# Patient Record
Sex: Female | Born: 1945 | Race: White | Hispanic: No | Marital: Married | Smoking: Never smoker
Health system: Southern US, Community
[De-identification: ages and names within clinical notes are randomized; demographics above are authoritative.]

## PROBLEM LIST (undated history)

## (undated) DIAGNOSIS — I10 Essential (primary) hypertension: Secondary | ICD-10-CM

## (undated) DIAGNOSIS — M549 Dorsalgia, unspecified: Secondary | ICD-10-CM

## (undated) DIAGNOSIS — G4730 Sleep apnea, unspecified: Secondary | ICD-10-CM

## (undated) DIAGNOSIS — R0602 Shortness of breath: Secondary | ICD-10-CM

## (undated) DIAGNOSIS — R002 Palpitations: Secondary | ICD-10-CM

## (undated) DIAGNOSIS — F419 Anxiety disorder, unspecified: Secondary | ICD-10-CM

## (undated) DIAGNOSIS — K5900 Constipation, unspecified: Secondary | ICD-10-CM

## (undated) DIAGNOSIS — M1990 Unspecified osteoarthritis, unspecified site: Secondary | ICD-10-CM

## (undated) DIAGNOSIS — K829 Disease of gallbladder, unspecified: Secondary | ICD-10-CM

## (undated) DIAGNOSIS — M7989 Other specified soft tissue disorders: Secondary | ICD-10-CM

## (undated) DIAGNOSIS — K219 Gastro-esophageal reflux disease without esophagitis: Secondary | ICD-10-CM

## (undated) DIAGNOSIS — M2550 Pain in unspecified joint: Secondary | ICD-10-CM

## (undated) DIAGNOSIS — F32A Depression, unspecified: Secondary | ICD-10-CM

## (undated) DIAGNOSIS — G709 Myoneural disorder, unspecified: Secondary | ICD-10-CM

## (undated) DIAGNOSIS — F329 Major depressive disorder, single episode, unspecified: Secondary | ICD-10-CM

## (undated) DIAGNOSIS — E669 Obesity, unspecified: Secondary | ICD-10-CM

## (undated) DIAGNOSIS — E119 Type 2 diabetes mellitus without complications: Secondary | ICD-10-CM

## (undated) DIAGNOSIS — J45909 Unspecified asthma, uncomplicated: Secondary | ICD-10-CM

## (undated) DIAGNOSIS — Z8639 Personal history of other endocrine, nutritional and metabolic disease: Secondary | ICD-10-CM

## (undated) DIAGNOSIS — R7303 Prediabetes: Secondary | ICD-10-CM

## (undated) HISTORY — DX: Disease of gallbladder, unspecified: K82.9

## (undated) HISTORY — DX: Personal history of other endocrine, nutritional and metabolic disease: Z86.39

## (undated) HISTORY — DX: Dorsalgia, unspecified: M54.9

## (undated) HISTORY — PX: ROTATOR CUFF REPAIR: SHX139

## (undated) HISTORY — DX: Constipation, unspecified: K59.00

## (undated) HISTORY — PX: CATARACT EXTRACTION W/ INTRAOCULAR LENS IMPLANT: SHX1309

## (undated) HISTORY — PX: APPENDECTOMY: SHX54

## (undated) HISTORY — DX: Unspecified osteoarthritis, unspecified site: M19.90

## (undated) HISTORY — DX: Pain in unspecified joint: M25.50

## (undated) HISTORY — PX: SHOULDER SURGERY: SHX246

## (undated) HISTORY — DX: Other specified soft tissue disorders: M79.89

## (undated) HISTORY — PX: KNEE ARTHROSCOPY: SUR90

## (undated) HISTORY — DX: Palpitations: R00.2

## (undated) HISTORY — DX: Shortness of breath: R06.02

## (undated) HISTORY — DX: Sleep apnea, unspecified: G47.30

## (undated) HISTORY — PX: COLONOSCOPY: SHX174

## (undated) HISTORY — PX: CATARACT EXTRACTION, BILATERAL: SHX1313

## (undated) HISTORY — DX: Gastro-esophageal reflux disease without esophagitis: K21.9

## (undated) HISTORY — PX: ABDOMINAL HYSTERECTOMY: SHX81

## (undated) HISTORY — PX: CHOLECYSTECTOMY: SHX55

## (undated) HISTORY — PX: KNEE SURGERY: SHX244

## (undated) HISTORY — DX: Obesity, unspecified: E66.9

---

## 2013-09-05 DIAGNOSIS — J309 Allergic rhinitis, unspecified: Secondary | ICD-10-CM | POA: Insufficient documentation

## 2015-07-12 ENCOUNTER — Encounter: Payer: Self-pay | Admitting: Pulmonary Disease

## 2015-07-12 ENCOUNTER — Ambulatory Visit (INDEPENDENT_AMBULATORY_CARE_PROVIDER_SITE_OTHER): Payer: Medicare Other | Admitting: Pulmonary Disease

## 2015-07-12 VITALS — BP 140/84 | HR 87 | Temp 98.3°F | Ht 64.0 in | Wt 242.0 lb

## 2015-07-12 DIAGNOSIS — G4733 Obstructive sleep apnea (adult) (pediatric): Secondary | ICD-10-CM | POA: Diagnosis not present

## 2015-07-12 DIAGNOSIS — R05 Cough: Secondary | ICD-10-CM

## 2015-07-12 DIAGNOSIS — R059 Cough, unspecified: Secondary | ICD-10-CM

## 2015-07-12 MED ORDER — CHLORPHENIRAMINE MALEATE 4 MG PO TABS: 4.0000 mg | ORAL_TABLET | Freq: Four times a day (QID) | ORAL | Status: DC

## 2015-07-12 MED ORDER — AZELASTINE-FLUTICASONE 137-50 MCG/ACT NA SUSP: 1.0000 | Freq: Two times a day (BID) | NASAL | Status: DC

## 2015-07-12 NOTE — Patient Instructions (Signed)
We will start you on chlorpheniramine 4 mg q6 hrs and dymista nasal spray You will be scheduled for PFTs and a sleep study.  Continue using the albuterol PRN and protonix as prescribed.  Return to clinic in 1 month.

## 2015-07-12 NOTE — Progress Notes (Addendum)
Subjective:    Patient ID: Crystal CrouchRebecca Jackson, female    DOB: [birth year], 70 y.o.   MRN: [id number]  HPI Consult for evaluation of chronic cough, bronchitis.  [name] is a 70 year old with past medical history of high blood pressure. She had been diagnosed with bronchitis as a child. She reports increasing frequency of attacks of the past few years. Her latest episode was in June 2017. She was treated at that time with an albuterol inhaler and a Z-Pak with no improvement in symptoms. She was subsequently given Symbicort and Cipro with improvement. A chest x-ray done at that time is reported as bronchitis with no infiltrate.  Her symptoms start with sinusitis postnasal drip which then proceeds to cough with yellow mucus, wheezing, chest congestion. She does not report any fevers or chills. She has seasonal allergies, rhinitis, postnasal drip. She has significant issues with acid reflux and heartburn and is on Protonix 40 mg twice a day. She was assessed by GI Dr. Mayford KnifeWilliams a few years ago and was told she has a small hiatal hernia. Her current symptoms are chronic daily cough, non-productive sputum. She has symptoms of snoring at night, daytime sleepiness and witnessed apneas as per her husband.  Social history: She is a never smoker, no alcohol, drug use. 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 She has no recent travel. She has no significant exposures at work or at home.  Family History: Heart disease-mother, father Prostate cancer-father  PMH: Hypertension   Current outpatient prescriptions:  .  albuterol (PROAIR HFA) 108 (90 Base) MCG/ACT inhaler, Inhale into the lungs., Disp: , Rfl:  .  Calcium Carbonate-Vitamin D (CALCIUM-VITAMIN D) 500-200 MG-UNIT tablet, Take by mouth., Disp: , Rfl:  .  dicyclomine (BENTYL) 20 MG tablet, Take by mouth., Disp: , Rfl:  .  estradiol (ESTRACE) 1 MG tablet, Take 1 mg by mouth., Disp: , Rfl:  .  hydrochlorothiazide (HYDRODIURIL) 25 MG tablet, , Disp:  , Rfl:  .  irbesartan (AVAPRO) 300 MG tablet, Take 300 mg by mouth., Disp: , Rfl:  .  metoprolol tartrate (LOPRESSOR) 25 MG tablet, , Disp: , Rfl:  .  Multiple Vitamin (MULTIVITAMIN) tablet, Take by mouth., Disp: , Rfl:  .  pantoprazole (PROTONIX) 40 MG tablet, Take 40 mg by mouth., Disp: , Rfl:  .  potassium chloride (K-DUR,KLOR-CON) 10 MEQ tablet, , Disp: , Rfl:  .  venlafaxine XR (EFFEXOR-XR) 75 MG 24 hr capsule, Take 225 mg by mouth., Disp: , Rfl:  .  Azelastine-Fluticasone 137-50 MCG/ACT SUSP, Place 1 spray into the nose 2 (two) times daily., Disp: 1 Bottle, Rfl: 3 .  chlorpheniramine (CHLOR-TRIMETON) 4 MG tablet, Take 1 tablet (4 mg total) by mouth every 6 (six) hours., Disp: 120 tablet, Rfl: 0  Review of Systems Dyspnea on exertion and at rest, cough no sputum production. Nasal congestion, rhinitis, postnasal drip. Positive for heartburn, indigestion. No nausea, vomiting, diarrhea, constipation. No chest pain, palpitation. No fevers, chills, malaise, loss of weight, loss of appetite. All other review of systems are negative.    Objective:   Physical Exam Blood pressure 140/84, pulse 87, temperature 98.3 F (36.8 C), temperature source Oral, height 5\' 4"  (1.626 m), weight 242 lb (109.77 kg), SpO2 95 %. Gen: No apparent distress Neuro: No gross focal deficits. HEENT: No JVD, lymphadenopathy, thyromegaly. Posterior pharyngeal erythema RS: Clear, No wheeze or crackles CVS: S1-S2 heard, no murmurs rubs gallops. Abdomen: Soft, positive bowel sounds. Musculoskeletal: No edema.    Assessment &  Plan:  Eval for Chronic cough, bronchitis  The presentation is likely upper airway cough syndrome with asthmatic bronchitis, exacerbated by postnasal drip and acid reflux. She may have concomitant untreated sleep apnea that is contributing to cough. She is on avapro for HTN but this is not commonly associated with cough   I will treat her in a stepwise manner. She will be started on  antihistamine with chlorpheniramine and dymista nasal spray. Schedule for pulmonary function tests. I'll hold off on using any long term controller inhalers for lung for now but will continue the albuterol PRN. She is already being maximally treated with PPI for acid reflux. If her symptoms persist then we may need to consider a GI referral and re evaluation of hiatal hernia. She will be scheduled for sleep study to evaluate for presence of sleep apnea.   I educated her on behavioral changes to deal with cough including conscious suppression of the urge to cough, use of throat lozenges.  Plan: - Start antihistamine with chlorpheniramine. Pt was warned about the side effect of increased sleepiness - Start dymista nasal spray - Continue protonix bid - PFTs and sleep study.  Chilton Greathouse MD [facility] Pager [phone] If no answer or after 3pm call: [phone] 07/12/2015, 5:13 PM

## 2015-07-20 DIAGNOSIS — G4733 Obstructive sleep apnea (adult) (pediatric): Secondary | ICD-10-CM | POA: Diagnosis not present

## 2015-07-22 ENCOUNTER — Other Ambulatory Visit: Payer: Self-pay | Admitting: *Deleted

## 2015-07-22 DIAGNOSIS — G4733 Obstructive sleep apnea (adult) (pediatric): Secondary | ICD-10-CM

## 2015-07-22 DIAGNOSIS — R05 Cough: Secondary | ICD-10-CM

## 2015-07-22 DIAGNOSIS — R059 Cough, unspecified: Secondary | ICD-10-CM

## 2015-07-25 ENCOUNTER — Encounter (INDEPENDENT_AMBULATORY_CARE_PROVIDER_SITE_OTHER): Payer: Medicare Other | Admitting: Pulmonary Disease

## 2015-07-25 DIAGNOSIS — R05 Cough: Secondary | ICD-10-CM

## 2015-07-25 DIAGNOSIS — G4733 Obstructive sleep apnea (adult) (pediatric): Secondary | ICD-10-CM

## 2015-07-25 DIAGNOSIS — R059 Cough, unspecified: Secondary | ICD-10-CM

## 2015-07-25 LAB — PULMONARY FUNCTION TEST
DL/VA % PRED: 90 %
DL/VA: 4.34 ml/min/mmHg/L
DLCO COR: 18.19 ml/min/mmHg
DLCO cor % pred: 74 %
DLCO unc % pred: 72 %
DLCO unc: 17.67 ml/min/mmHg
FEF 25-75 PRE: 1.2 L/s
FEF 25-75 Post: 1.61 L/sec
FEF2575-%CHANGE-POST: 33 %
FEF2575-%PRED-PRE: 61 %
FEF2575-%Pred-Post: 82 %
FEV1-%Change-Post: 7 %
FEV1-%PRED-PRE: 77 %
FEV1-%Pred-Post: 83 %
FEV1-POST: 1.92 L
FEV1-PRE: 1.8 L
FEV1FVC-%CHANGE-POST: 4 %
FEV1FVC-%Pred-Pre: 95 %
FEV6-%CHANGE-POST: 1 %
FEV6-%PRED-PRE: 85 %
FEV6-%Pred-Post: 86 %
FEV6-POST: 2.52 L
FEV6-PRE: 2.49 L
FEV6FVC-%Change-Post: 0 %
FEV6FVC-%PRED-POST: 103 %
FEV6FVC-%PRED-PRE: 104 %
FVC-%Change-Post: 2 %
FVC-%PRED-POST: 83 %
FVC-%Pred-Pre: 81 %
FVC-POST: 2.54 L
FVC-Pre: 2.49 L
POST FEV6/FVC RATIO: 99 %
PRE FEV1/FVC RATIO: 72 %
Post FEV1/FVC ratio: 76 %
Pre FEV6/FVC Ratio: 100 %
RV % PRED: 83 %
RV: 1.8 L
TLC % PRED: 89 %
TLC: 4.5 L

## 2015-08-29 ENCOUNTER — Ambulatory Visit: Payer: Medicare Other | Admitting: Pulmonary Disease

## 2015-09-09 ENCOUNTER — Other Ambulatory Visit: Payer: Self-pay | Admitting: Pulmonary Disease

## 2015-09-09 DIAGNOSIS — R05 Cough: Secondary | ICD-10-CM

## 2015-09-09 DIAGNOSIS — R059 Cough, unspecified: Secondary | ICD-10-CM

## 2015-09-09 DIAGNOSIS — G4733 Obstructive sleep apnea (adult) (pediatric): Secondary | ICD-10-CM

## 2015-09-09 MED ORDER — CHLORPHENIRAMINE MALEATE 4 MG PO TABS: 4.0000 mg | ORAL_TABLET | Freq: Four times a day (QID) | ORAL | 0 refills | Status: DC

## 2015-09-09 NOTE — Telephone Encounter (Signed)
Received faxed refill request for pt's Chlor-tabs Rx sent Will sign off

## 2015-09-12 ENCOUNTER — Ambulatory Visit (INDEPENDENT_AMBULATORY_CARE_PROVIDER_SITE_OTHER): Payer: Medicare Other | Admitting: Pulmonary Disease

## 2015-09-12 ENCOUNTER — Encounter (INDEPENDENT_AMBULATORY_CARE_PROVIDER_SITE_OTHER): Payer: Self-pay

## 2015-09-12 ENCOUNTER — Encounter: Payer: Self-pay | Admitting: Pulmonary Disease

## 2015-09-12 VITALS — BP 128/76 | HR 85 | Ht 64.5 in | Wt 243.8 lb

## 2015-09-12 DIAGNOSIS — R05 Cough: Secondary | ICD-10-CM

## 2015-09-12 DIAGNOSIS — R059 Cough, unspecified: Secondary | ICD-10-CM

## 2015-09-12 DIAGNOSIS — Z23 Encounter for immunization: Secondary | ICD-10-CM | POA: Diagnosis not present

## 2015-09-12 DIAGNOSIS — G4733 Obstructive sleep apnea (adult) (pediatric): Secondary | ICD-10-CM

## 2015-09-12 MED ORDER — BECLOMETHASONE DIPROPIONATE 40 MCG/ACT IN AERS: 2.0000 | INHALATION_SPRAY | Freq: Two times a day (BID) | RESPIRATORY_TRACT | 5 refills | Status: DC

## 2015-09-12 MED ORDER — BECLOMETHASONE DIPROPIONATE 40 MCG/ACT IN AERS: 2.0000 | INHALATION_SPRAY | Freq: Two times a day (BID) | RESPIRATORY_TRACT | 0 refills | Status: DC

## 2015-09-12 MED ORDER — FLUTICASONE PROPIONATE 50 MCG/ACT NA SUSP: 2.0000 | Freq: Every day | NASAL | 5 refills | Status: DC

## 2015-09-12 NOTE — Patient Instructions (Signed)
We will start you on Qvar inhaler. Continue using albuterol as needed. We'll start you on a CPAP for mild sleep apnea. Continue using the chlorpheniramine. It's okay to switch dymista to Flonase nasal spray. You'll get the flu vaccine today.  Return to clinic in 6 months.

## 2015-09-12 NOTE — Progress Notes (Signed)
Crystal Jackson    [id number]    1945/04/19  Primary Care Physician:POLLOCK, Delton See, MD  Referring Physician: Albertina Senegal, MD [address] [place], [place] [address]  Chief complaint:   Follow up for Upper airway chronic cough,  Bronchitis.  HPI: Crystal Jackson is a 70 year old with past medical history of high blood pressure. She had been diagnosed with bronchitis as a child. She reports increasing frequency of attacks of the past few years. Her latest episode was in June 2017. She was treated at that time with an albuterol inhaler and a Z-Pak with no improvement in symptoms. She was subsequently given Symbicort and Cipro with improvement. A chest x-ray done at that time is reported as bronchitis with no infiltrate.  Her symptoms start with sinusitis postnasal drip which then proceeds to cough with yellow mucus, wheezing, chest congestion. She does not report any fevers or chills. She has seasonal allergies, rhinitis, postnasal drip. She has significant issues with acid reflux and heartburn and is on Protonix 40 mg twice a day. She was assessed by GI Dr. Mayford Jackson a few years ago and was told she has a small hiatal hernia. Her current symptoms are chronic daily cough, non-productive sputum. She has symptoms of snoring at night, daytime sleepiness and witnessed apneas as per her husband.  Outpatient Encounter Prescriptions as of 09/12/2015  Medication Sig  . albuterol (PROAIR HFA) 108 (90 Base) MCG/ACT inhaler Inhale into the lungs.  Marland Kitchen amoxicillin-clavulanate (AUGMENTIN) 875-125 MG tablet Take 1 tablet by mouth 2 (two) times daily. Beginning 9/10 for cat bite  . Azelastine-Fluticasone 137-50 MCG/ACT SUSP Place 1 spray into the nose 2 (two) times daily.  . Calcium Carbonate-Vitamin D (CALCIUM-VITAMIN D) 500-200 MG-UNIT tablet Take by mouth.  . chlorpheniramine (CHLOR-TRIMETON) 4 MG tablet Take 1 tablet (4 mg total) by mouth every 6 (six) hours.  Marland Kitchen dicyclomine (BENTYL) 20 MG  tablet Take by mouth.  . DULoxetine (CYMBALTA) 60 MG capsule Take 60 mg by mouth daily.  Marland Kitchen estradiol (ESTRACE) 1 MG tablet Take 1 mg by mouth.  . hydrochlorothiazide (HYDRODIURIL) 25 MG tablet   . irbesartan (AVAPRO) 300 MG tablet Take 300 mg by mouth.  . metoprolol tartrate (LOPRESSOR) 25 MG tablet   . Multiple Vitamin (MULTIVITAMIN) tablet Take by mouth.  . pantoprazole (PROTONIX) 40 MG tablet Take 40 mg by mouth.  . potassium chloride (K-DUR,KLOR-CON) 10 MEQ tablet   . [DISCONTINUED] venlafaxine XR (EFFEXOR-XR) 75 MG 24 hr capsule Take 225 mg by mouth.   No facility-administered encounter medications on file as of 09/12/2015.     Allergies as of 09/12/2015 - Review Complete 09/12/2015  Allergen Reaction Noted  . Codeine Nausea And Vomiting 07/12/2015  . Penicillin g Rash 07/12/2015    History reviewed. No pertinent past medical history.  History reviewed. No pertinent surgical history.  History reviewed. No pertinent family history.  Social History   Social History  . Marital status: Married    Spouse name: N/A  . Number of children: N/A  . Years of education: N/A   Occupational History  . Not on file.   Social History Main Topics  . Smoking status: Never Smoker  . Smokeless tobacco: Never Used  . Alcohol use No  . Drug use: No  . Sexual activity: Not on file   Other Topics Concern  . Not on file   Social History Narrative  . No narrative on file     Review of systems:  Review of Systems  Constitutional: Negative for fever and chills.  HENT: Negative.   Eyes: Negative for blurred vision.  Respiratory: as per HPI  Cardiovascular: Negative for chest pain and palpitations.  Gastrointestinal: Negative for vomiting, diarrhea, blood per rectum. Genitourinary: Negative for dysuria, urgency, frequency and hematuria.  Musculoskeletal: Negative for myalgias, back pain and joint pain.  Skin: Negative for itching and rash.  Neurological: Negative for dizziness,  tremors, focal weakness, seizures and loss of consciousness.  Endo/Heme/Allergies: Negative for environmental allergies.  Psychiatric/Behavioral: Negative for depression, suicidal ideas and hallucinations.  All other systems reviewed and are negative.   Physical Exam: There were no vitals taken for this visit. Gen:      No acute distress HEENT:  EOMI, sclera anicteric Neck:     No masses; no thyromegaly Lungs:    Clear to auscultation bilaterally; normal respiratory effort CV:         Regular rate and rhythm; no murmurs Abd:      + bowel sounds; soft, non-tender; no palpable masses, no distension Ext:    No edema; adequate peripheral perfusion Skin:      Warm and dry; no rash Neuro: alert and oriented x 3 Psych: normal mood and affect  Data Reviewed: PFTs 7/44/17 FVC 2.54 [83%] FEV1 1.9 to [83%) F/F 76 TLC 89% DLCO 72%. Minimal obstructive lung disease with mild reduction in diffusion capacity that corrects for alveolar volume.  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 Low O2 sats of 71%.  Assessment/Plan:  #1 Upper airway cough syndrome, Asthmatic bronchitis. Symptoms are much improved on chlorpheniramine and dymista nasal spray. She has switched the dymista to flonase due to the cost of meds. She notices a worsening of symptoms when she stops these meds and will continue the same. Her PFTs were reviewed today. There is minimal obstruction. However there is marked reduction in mid flow rates indicating small airway disease with significant improvement post albuterol inhaler. I believe she'll benefit from long-term controller medication and will start her on a Qvar inhaler. She'll continue the albuterol as needed.  #2 OSA Mild OSA on recent home sleep study. I'll start her on an AutoSet CPAP 5- 15. Check download at next visit.  Plan: - Continue chlorpheniramine - OK to change dymista to flonase - Start qvar inhaler. Continue albuterol PRN - Start Autoset CPAP.  Crystal GreathousePraveen  Crystal Fetsch MD [facility] Pager [phone] If no answer or after 3pm call: [phone] 09/12/2015, 2:17 PM  CC: Crystal SenegalPollock, Nelson, MD

## 2015-09-19 ENCOUNTER — Telehealth: Payer: Self-pay | Admitting: Pulmonary Disease

## 2015-09-19 NOTE — Telephone Encounter (Signed)
Called [facility] [phone]. Pt's ID: [id number]. Spoke with Lanora ManisElizabeth. PA has been started and sent to clinical review. PA #: [id number]. Pt's last OV note has been sent to Dr. Julius BowelsPollock.  lmtcb x1 for pt.

## 2015-09-20 NOTE — Telephone Encounter (Signed)
Received fax from [facility] regarding PA for pt's QVAR: coverage has been denied  "We denied this request under Medicare Paret D because..the use is not supported by the FDA or by one of the Medicare approved references for treating.Marland Kitchen.Marland Kitchen.Upper airway cough syndrome."  We have tried to reach patient x2 to inform her that the PA was initiated (last attempt was today 9/19).  Will await her return call to inform her the PA was denied   Called [facility] @ [phone] and spoke with Jon GillsAlexis to check for covered alternatives: Flovent, Arnuity.  PM please advise if you would like to do an appeal or try a different medication.  Thanks!

## 2015-09-20 NOTE — Telephone Encounter (Signed)
Spoke with pt. She is aware of all of this information.  PM - please advise. Thanks.

## 2015-09-20 NOTE — Telephone Encounter (Signed)
lmtcb x2 for pt. 

## 2015-09-20 NOTE — Telephone Encounter (Signed)
Patient returning our call - She can be reached at [phone]-pr

## 2015-09-21 NOTE — Telephone Encounter (Signed)
PM please advise 

## 2015-09-21 NOTE — Telephone Encounter (Signed)
She has a diagnosis of asthmatic bronchitis as well. That should be covered by Qvar. Thanks

## 2015-09-22 NOTE — Telephone Encounter (Signed)
Spoke with Tammy at [facility] to see if a new PA can be initiated, or if this must be appealed through [employer].  Per Tammy we must proceed with an appeal through Medicare, as Medicare will only accept one PA per medication every 60 days.  Tammy is refaxing denial letter with documentation for appealing decision.   Will await fax.

## 2015-09-23 NOTE — Telephone Encounter (Signed)
Routing to [place] for follow up on appeal.

## 2015-09-27 NOTE — Telephone Encounter (Signed)
Spoke with Crystal Jackson and she does have the appeal letter.

## 2015-09-28 NOTE — Telephone Encounter (Signed)
Appeal information received Form filled out and placed in PM's folder for signature

## 2015-09-29 NOTE — Telephone Encounter (Signed)
Awaiting PM's return to office on 10/03/15

## 2015-10-01 ENCOUNTER — Emergency Department (HOSPITAL_BASED_OUTPATIENT_CLINIC_OR_DEPARTMENT_OTHER)
Admission: EM | Admit: 2015-10-01 | Discharge: 2015-10-01 | Disposition: A | Discharge status: Home / Self Care | Payer: Medicare Other | Attending: Emergency Medicine | Admitting: Emergency Medicine

## 2015-10-01 ENCOUNTER — Encounter (HOSPITAL_BASED_OUTPATIENT_CLINIC_OR_DEPARTMENT_OTHER): Payer: Self-pay | Admitting: Emergency Medicine

## 2015-10-01 DIAGNOSIS — S81852A Open bite, left lower leg, initial encounter: Secondary | ICD-10-CM | POA: Diagnosis not present

## 2015-10-01 DIAGNOSIS — J45909 Unspecified asthma, uncomplicated: Secondary | ICD-10-CM | POA: Diagnosis not present

## 2015-10-01 DIAGNOSIS — Y999 Unspecified external cause status: Secondary | ICD-10-CM | POA: Insufficient documentation

## 2015-10-01 DIAGNOSIS — S8992XA Unspecified injury of left lower leg, initial encounter: Secondary | ICD-10-CM | POA: Diagnosis present

## 2015-10-01 DIAGNOSIS — Y929 Unspecified place or not applicable: Secondary | ICD-10-CM | POA: Insufficient documentation

## 2015-10-01 DIAGNOSIS — I10 Essential (primary) hypertension: Secondary | ICD-10-CM | POA: Insufficient documentation

## 2015-10-01 DIAGNOSIS — W5501XA Bitten by cat, initial encounter: Secondary | ICD-10-CM | POA: Diagnosis not present

## 2015-10-01 DIAGNOSIS — Y9389 Activity, other specified: Secondary | ICD-10-CM | POA: Diagnosis not present

## 2015-10-01 DIAGNOSIS — L089 Local infection of the skin and subcutaneous tissue, unspecified: Secondary | ICD-10-CM

## 2015-10-01 HISTORY — DX: Unspecified asthma, uncomplicated: J45.909

## 2015-10-01 HISTORY — DX: Essential (primary) hypertension: I10

## 2015-10-01 HISTORY — DX: Major depressive disorder, single episode, unspecified: F32.9

## 2015-10-01 HISTORY — DX: Depression, unspecified: F32.A

## 2015-10-01 HISTORY — DX: Anxiety disorder, unspecified: F41.9

## 2015-10-01 MED ORDER — AMOXICILLIN-POT CLAVULANATE 500-125 MG PO TABS: 1.0000 | ORAL_TABLET | Freq: Three times a day (TID) | ORAL | 0 refills | Status: DC

## 2015-10-01 MED ORDER — SODIUM CHLORIDE 0.9 % IV SOLN
3.0000 g | Freq: Once | INTRAVENOUS | Status: AC
  Administered 2015-10-01: 3 g via INTRAVENOUS
  Filled 2015-10-01: qty 3

## 2015-10-01 NOTE — ED Triage Notes (Signed)
Pt reports persistent pain after completing course of antibiotics for tx of animal bite to lower left leg.

## 2015-10-01 NOTE — Discharge Instructions (Signed)
Local wound care with bacitracin and dressing changes twice daily.  Augmentin as prescribed.  Follow-up with Dr. Magnus IvanBlackman on Monday for a wound check. His contact information has been provided in this discharge summary for you to call and make these arrangements.

## 2015-10-01 NOTE — ED Provider Notes (Signed)
[facility]   CSN: [id number] Arrival date & time: 10/01/15  1406     History   Chief Complaint Chief Complaint  Patient presents with  . Wound Check    HPI Neil CrouchRebecca Virtue is a 70 y.o. female.  Patient is a 70 year old female with history of hypertension and asthma. She presents for evaluation of left leg pain and swelling. She was bitten by her own cat approximately 3 weeks ago while breaking up a fight between the cat and another animal. Since that time she has been treated with Augmentin, then doxycycline, however she continues to have drainage, redness, and pain. She denies any fevers or chills.      Past Medical History:  Diagnosis Date  . Anxiety   . Asthma   . Depression   . Hypertension     There are no active problems to display for this patient.   Past Surgical History:  Procedure Laterality Date  . ABDOMINAL HYSTERECTOMY    . APPENDECTOMY    . CHOLECYSTECTOMY    . KNEE SURGERY Left   . SHOULDER SURGERY Right     OB History    No data available       Home Medications    Prior to Admission medications   Medication Sig Start Date End Date Taking? Authorizing Provider  albuterol (PROAIR HFA) 108 (90 Base) MCG/ACT inhaler Inhale into the lungs. 06/10/15   Historical Provider, MD  amoxicillin-clavulanate (AUGMENTIN) 875-125 MG tablet Take 1 tablet by mouth 2 (two) times daily. Beginning 9/10 for cat bite 09/10/15   Historical Provider, MD  beclomethasone (QVAR) 40 MCG/ACT inhaler Inhale 2 puffs into the lungs 2 (two) times daily. 09/12/15   Praveen Mannam, MD  beclomethasone (QVAR) 40 MCG/ACT inhaler Inhale 2 puffs into the lungs 2 (two) times daily. 09/12/15 09/13/15  Praveen Mannam, MD  Calcium Carbonate-Vitamin D (CALCIUM-VITAMIN D) 500-200 MG-UNIT tablet Take by mouth.    Historical Provider, MD  chlorpheniramine (CHLOR-TRIMETON) 4 MG tablet Take 1 tablet (4 mg total) by mouth every 6 (six) hours. 09/09/15   Praveen Mannam, MD    dicyclomine (BENTYL) 20 MG tablet Take by mouth.    Historical Provider, MD  DULoxetine (CYMBALTA) 60 MG capsule Take 90 mg by mouth daily.  08/25/15   Historical Provider, MD  estradiol (ESTRACE) 1 MG tablet Take 1 mg by mouth.    Historical Provider, MD  fluticasone (FLONASE) 50 MCG/ACT nasal spray Place 2 sprays into both nostrils daily. 09/12/15   Praveen Mannam, MD  hydrochlorothiazide (HYDRODIURIL) 25 MG tablet  02/24/15   Historical Provider, MD  irbesartan (AVAPRO) 300 MG tablet Take 300 mg by mouth. 01/17/15   Historical Provider, MD  metoprolol tartrate (LOPRESSOR) 25 MG tablet  01/27/15   Historical Provider, MD  Multiple Vitamin (MULTIVITAMIN) tablet Take by mouth.    Historical Provider, MD  pantoprazole (PROTONIX) 40 MG tablet Take 40 mg by mouth. 01/31/15   Historical Provider, MD  potassium chloride (K-DUR,KLOR-CON) 10 MEQ tablet  01/31/15   Historical Provider, MD    Family History No family history on file.  Social History Social History  Substance Use Topics  . Smoking status: Never Smoker  . Smokeless tobacco: Never Used  . Alcohol use No     Allergies   Codeine and Penicillin g   Review of Systems Review of Systems  All other systems reviewed and are negative.    Physical Exam Updated Vital Signs BP 132/71 (BP Location: Right Arm)  Pulse 86   Temp 97.4 F (36.3 C) (Oral)   Resp 18   Ht 5\' 4"  (1.626 m)   Wt 235 lb (106.6 kg)   SpO2 98%   BMI 40.34 kg/m   Physical Exam  Constitutional: She is oriented to person, place, and time. She appears well-developed and well-nourished. No distress.  HENT:  Head: Normocephalic and atraumatic.  Neck: Normal range of motion. Neck supple.  Musculoskeletal:  The left lower extremity is noted to have 4 puncture wounds noted to the anterior aspect of the distal third of the left shin. There is surrounding erythema and serous drainage, however no purulent drainage is noted. Distal pulses are easily palpable.   Neurological: She is alert and oriented to person, place, and time.  Skin: Skin is warm and dry. She is not diaphoretic.  Nursing note and vitals reviewed.    ED Treatments / Results  Labs (all labs ordered are listed, but only abnormal results are displayed) Labs Reviewed - No data to display  EKG  EKG Interpretation None       Radiology No results found.  Procedures Procedures (including critical care time)  Medications Ordered in ED Medications  Ampicillin-Sulbactam (UNASYN) 3 g in sodium chloride 0.9 % 100 mL IVPB (not administered)     Initial Impression / Assessment and Plan / ED Course  I have reviewed the triage vital signs and the nursing notes.  Pertinent labs & imaging results that were available during my care of the patient were reviewed by me and considered in my medical decision making (see chart for details).  Clinical Course    Patient will be given IV Unasyn in the ER and discharged on augmentin. I've spoken with Dr. Magnus Ivan from orthopedic surgery who will follow patient up in the office on Monday. She is to call to make these arrangements.  Final Clinical Impressions(s) / ED Diagnoses   Final diagnoses:  None    New Prescriptions New Prescriptions   No medications on file     Geoffery Lyons, MD 10/01/15 1514

## 2015-10-03 ENCOUNTER — Inpatient Hospital Stay (INDEPENDENT_AMBULATORY_CARE_PROVIDER_SITE_OTHER): Payer: Medicare Other | Admitting: Orthopaedic Surgery

## 2015-10-03 DIAGNOSIS — S91052A Open bite, left ankle, initial encounter: Secondary | ICD-10-CM

## 2015-10-03 NOTE — Telephone Encounter (Signed)
Appeal signed by PM and faxed back to [facility] @ [phone] Will await decision

## 2015-10-05 NOTE — Telephone Encounter (Signed)
[name] Her clinicial questions were answered. Will await appeal decision.

## 2015-10-05 NOTE — Telephone Encounter (Signed)
[name] and would like to ask clinical questions. She can be reached at [phone]. -pr

## 2015-10-10 ENCOUNTER — Ambulatory Visit (INDEPENDENT_AMBULATORY_CARE_PROVIDER_SITE_OTHER): Payer: Medicare Other | Admitting: Orthopaedic Surgery

## 2015-10-10 DIAGNOSIS — S91052D Open bite, left ankle, subsequent encounter: Secondary | ICD-10-CM | POA: Diagnosis not present

## 2015-10-10 NOTE — Telephone Encounter (Signed)
Patient calling regarding denial of Qvar - she can be reached at [phone]

## 2015-10-10 NOTE — Telephone Encounter (Signed)
Spoke with pt and she has received DENIAL letter from the appeal for Qvar. She would like to know what she needs to do now. What other inhaler do you recommend?  PM - Please advise. Thanks!

## 2015-10-12 NOTE — Telephone Encounter (Signed)
Spoke with pt. She is going to contact her insurance for prescription formulary and give us a call back. Will await her call back.

## 2015-10-12 NOTE — Telephone Encounter (Signed)
Can we check with insurance which steroids inhaler is covered? Thanks

## 2015-10-17 ENCOUNTER — Ambulatory Visit (INDEPENDENT_AMBULATORY_CARE_PROVIDER_SITE_OTHER): Payer: Medicare Other | Admitting: Orthopaedic Surgery

## 2015-10-17 DIAGNOSIS — S81852D Open bite, left lower leg, subsequent encounter: Secondary | ICD-10-CM

## 2015-10-24 ENCOUNTER — Emergency Department (HOSPITAL_COMMUNITY): Payer: Medicare Other

## 2015-10-24 ENCOUNTER — Emergency Department (HOSPITAL_COMMUNITY)
Admission: EM | Admit: 2015-10-24 | Discharge: 2015-10-24 | Disposition: A | Discharge status: Home / Self Care | Payer: Medicare Other | Attending: Emergency Medicine | Admitting: Emergency Medicine

## 2015-10-24 ENCOUNTER — Encounter (HOSPITAL_COMMUNITY): Payer: Self-pay | Admitting: Emergency Medicine

## 2015-10-24 DIAGNOSIS — J45909 Unspecified asthma, uncomplicated: Secondary | ICD-10-CM | POA: Insufficient documentation

## 2015-10-24 DIAGNOSIS — Z79899 Other long term (current) drug therapy: Secondary | ICD-10-CM | POA: Diagnosis not present

## 2015-10-24 DIAGNOSIS — S81852D Open bite, left lower leg, subsequent encounter: Secondary | ICD-10-CM | POA: Diagnosis not present

## 2015-10-24 DIAGNOSIS — I10 Essential (primary) hypertension: Secondary | ICD-10-CM | POA: Insufficient documentation

## 2015-10-24 DIAGNOSIS — W5501XD Bitten by cat, subsequent encounter: Secondary | ICD-10-CM | POA: Insufficient documentation

## 2015-10-24 DIAGNOSIS — S91052D Open bite, left ankle, subsequent encounter: Secondary | ICD-10-CM

## 2015-10-24 DIAGNOSIS — T1490XD Injury, unspecified, subsequent encounter: Secondary | ICD-10-CM

## 2015-10-24 LAB — CBC WITH DIFFERENTIAL/PLATELET
BASOS ABS: 0 10*3/uL (ref 0.0–0.1)
BASOS PCT: 1 %
EOS ABS: 0.2 10*3/uL (ref 0.0–0.7)
EOS PCT: 3 %
HCT: 36.1 % (ref 36.0–46.0)
Hemoglobin: 12.1 g/dL (ref 12.0–15.0)
Lymphocytes Relative: 38 %
Lymphs Abs: 2.1 10*3/uL (ref 0.7–4.0)
MCH: 29 pg (ref 26.0–34.0)
MCHC: 33.5 g/dL (ref 30.0–36.0)
MCV: 86.6 fL (ref 78.0–100.0)
MONO ABS: 0.4 10*3/uL (ref 0.1–1.0)
MONOS PCT: 7 %
Neutro Abs: 3 10*3/uL (ref 1.7–7.7)
Neutrophils Relative %: 53 %
PLATELETS: 234 10*3/uL (ref 150–400)
RBC: 4.17 MIL/uL (ref 3.87–5.11)
RDW: 13.3 % (ref 11.5–15.5)
WBC: 5.6 10*3/uL (ref 4.0–10.5)

## 2015-10-24 LAB — COMPREHENSIVE METABOLIC PANEL
ALK PHOS: 35 U/L — AB (ref 38–126)
ALT: 27 U/L (ref 14–54)
ANION GAP: 11 (ref 5–15)
AST: 32 U/L (ref 15–41)
Albumin: 3.6 g/dL (ref 3.5–5.0)
BILIRUBIN TOTAL: 0.5 mg/dL (ref 0.3–1.2)
BUN: 13 mg/dL (ref 6–20)
CALCIUM: 9.2 mg/dL (ref 8.9–10.3)
CO2: 27 mmol/L (ref 22–32)
CREATININE: 1.02 mg/dL — AB (ref 0.44–1.00)
Chloride: 97 mmol/L — ABNORMAL LOW (ref 101–111)
GFR, EST NON AFRICAN AMERICAN: 55 mL/min — AB (ref >60)
Glucose, Bld: 100 mg/dL — ABNORMAL HIGH (ref 65–99)
Potassium: 3.5 mmol/L (ref 3.5–5.1)
SODIUM: 135 mmol/L (ref 135–145)
TOTAL PROTEIN: 6.7 g/dL (ref 6.5–8.1)

## 2015-10-24 LAB — URINALYSIS, ROUTINE W REFLEX MICROSCOPIC
BILIRUBIN URINE: NEGATIVE
Glucose, UA: NEGATIVE mg/dL
Hgb urine dipstick: NEGATIVE
KETONES UR: NEGATIVE mg/dL
LEUKOCYTES UA: NEGATIVE
NITRITE: NEGATIVE
PH: 6 (ref 5.0–8.0)
PROTEIN: NEGATIVE mg/dL
Specific Gravity, Urine: 1.006 (ref 1.005–1.030)

## 2015-10-24 LAB — I-STAT BETA HCG BLOOD, ED (MC, WL, AP ONLY)

## 2015-10-24 LAB — I-STAT CG4 LACTIC ACID, ED: LACTIC ACID, VENOUS: 1.69 mmol/L (ref 0.5–1.9)

## 2015-10-24 NOTE — ED Notes (Signed)
Pt transported to xray 

## 2015-10-24 NOTE — ED Triage Notes (Signed)
Has been seen several times for cat bite that happened 6 weks ago On left lower leg, has had several  Rounds of antiobiotics and has seen ortho but it is till red and hurting

## 2015-10-24 NOTE — ED Provider Notes (Signed)
[facility]-EM[facility]ENCY DEPT Provider Note   CSN: [id number] Arrival date & time: 10/24/15  1111     History   Chief Complaint Chief Complaint  Patient presents with  . Wound Infection  . Fever    HPI Crystal Jackson is a 70 y.o. female who presents for a wound check. PMH significant for HTN and asthma. She was bitten by her cat approximately 6 weeks ago on the left lower leg. Initially she went to UC who put her on Augmentin. She completed the course and returned to UC after she felt like it wasn't getting better. She was then put on a course of Doxy. She represented to [facility] on 9/30 and was given IV Unasyn and then another course of Augmentin. She was given a follow up with Dr. Magnus Ivan who she saw the next day. He performed a washout. She saw him again 1 week ago and he evaluated it at the time and per patient stated it looked better. Today she took her cat to the vet for a check up and showed them the wound. They told her she should get it checked again. She continues to have redness, drainage, pain although she states it does look better. She denies fever, chills, inability to ambulate. 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 No hx of DM or PVD.  HPI  Past Medical History:  Diagnosis Date  . Anxiety   . Asthma   . Depression   . Hypertension     There are no active problems to display for this patient.   Past Surgical History:  Procedure Laterality Date  . ABDOMINAL HYSTERECTOMY    . APPENDECTOMY    . CHOLECYSTECTOMY    . KNEE SURGERY Left   . SHOULDER SURGERY Right     OB History    No data available       Home Medications    Prior to Admission medications   Medication Sig Start Date End Date Taking? Authorizing Provider  albuterol (PROAIR HFA) 108 (90 Base) MCG/ACT inhaler Inhale into the lungs. 06/10/15   Historical Provider, MD  amoxicillin-clavulanate (AUGMENTIN) 500-125 MG tablet Take 1 tablet (500 mg total) by mouth every 8 (eight) hours. 10/01/15   Geoffery Lyons, MD  beclomethasone (QVAR) 40 MCG/ACT inhaler Inhale 2 puffs into the lungs 2 (two) times daily. 09/12/15   Praveen Mannam, MD  beclomethasone (QVAR) 40 MCG/ACT inhaler Inhale 2 puffs into the lungs 2 (two) times daily. 09/12/15 09/13/15  Praveen Mannam, MD  Calcium Carbonate-Vitamin D (CALCIUM-VITAMIN D) 500-200 MG-UNIT tablet Take by mouth.    Historical Provider, MD  chlorpheniramine (CHLOR-TRIMETON) 4 MG tablet Take 1 tablet (4 mg total) by mouth every 6 (six) hours. 09/09/15   Praveen Mannam, MD  dicyclomine (BENTYL) 20 MG tablet Take by mouth.    Historical Provider, MD  DULoxetine (CYMBALTA) 60 MG capsule Take 90 mg by mouth daily.  08/25/15   Historical Provider, MD  estradiol (ESTRACE) 1 MG tablet Take 1 mg by mouth.    Historical Provider, MD  fluticasone (FLONASE) 50 MCG/ACT nasal spray Place 2 sprays into both nostrils daily. 09/12/15   Praveen Mannam, MD  hydrochlorothiazide (HYDRODIURIL) 25 MG tablet  02/24/15   Historical Provider, MD  irbesartan (AVAPRO) 300 MG tablet Take 300 mg by mouth. 01/17/15   Historical Provider, MD  metoprolol tartrate (LOPRESSOR) 25 MG tablet  01/27/15   Historical Provider, MD  Multiple Vitamin (MULTIVITAMIN) tablet Take by mouth.    Historical Provider, MD  pantoprazole (PROTONIX) 40 MG tablet Take 40 mg by mouth. 01/31/15   Historical Provider, MD  potassium chloride (K-DUR,KLOR-CON) 10 MEQ tablet  01/31/15   Historical Provider, MD    Family History No family history on file.  Social History Social History  Substance Use Topics  . Smoking status: Never Smoker  . Smokeless tobacco: Never Used  . Alcohol use No     Allergies   Codeine and Penicillin g   Review of Systems Review of Systems  Constitutional: Negative for chills and fever.  Musculoskeletal: Positive for arthralgias. Negative for gait problem.  Skin: Positive for color change and wound.  All other systems reviewed and are negative.    Physical Exam Updated Vital Signs BP  140/82   Pulse 75   Temp 97.7 F (36.5 C) (Oral)   Resp 16   Ht 5' 4.5" (1.638 m)   Wt 110.5 kg   SpO2 99%   BMI 41.17 kg/m   Physical Exam  Constitutional: She is oriented to person, place, and time. She appears well-developed and well-nourished. No distress.  HENT:  Head: Normocephalic and atraumatic.  Eyes: Conjunctivae are normal. Pupils are equal, round, and reactive to light. Right eye exhibits no discharge. Left eye exhibits no discharge. No scleral icterus.  Neck: Normal range of motion. Neck supple.  Cardiovascular: Normal rate and regular rhythm.   No murmur heard. Pulmonary/Chest: Effort normal and breath sounds normal. No respiratory distress.  Abdominal: Soft. She exhibits no distension. There is no tenderness.  Musculoskeletal: She exhibits no edema.  L lower leg: Mild swelling, erythema, and induration of the anterior lower leg. 4 puncture wounds noted without drainage. Mild tenderness to palpation. 2+ DP pulse   Neurological: She is alert and oriented to person, place, and time.  Skin: Skin is warm and dry.  Psychiatric: She has a normal mood and affect. Her behavior is normal.  Nursing note and vitals reviewed.    ED Treatments / Results  Labs (all labs ordered are listed, but only abnormal results are displayed) Labs Reviewed  COMPREHENSIVE METABOLIC PANEL - Abnormal; Notable for the following:       Result Value   Chloride 97 (*)    Glucose, Bld 100 (*)    Creatinine, Ser 1.02 (*)    Alkaline Phosphatase 35 (*)    GFR calc non Af Amer 55 (*)    All other components within normal limits  CULTURE, BLOOD (ROUTINE X 2)  CULTURE, BLOOD (ROUTINE X 2)  URINE CULTURE  CBC WITH DIFFERENTIAL/PLATELET  URINALYSIS, ROUTINE W REFLEX MICROSCOPIC (NOT AT [facility])  I-STAT BETA HCG BLOOD, ED ([facility], [facility], [facility] ONLY)  I-STAT CG4 LACTIC ACID, ED    EKG  EKG Interpretation None       Radiology No results found.  Procedures Procedures (including critical care  time)  Medications Ordered in ED Medications - No data to display   Initial Impression / Assessment and Plan / ED Course  I have reviewed the triage vital signs and the nursing notes.  Pertinent labs & imaging results that were available during my care of the patient were reviewed by me and considered in my medical decision making (see chart for details).  Clinical Course   70 year old female presents with evaluation of leg wound. Patient is afebrile, not tachycardic or tachypneic, normotensive, and not hypoxic. CBC unremarkable. CMP overall unremarkable. Lactic acid is 1.69. Xray unremarkable. Wound appears to be healing well. Shared visit with Dr. Antonietta BarcelonaSchlossmann. Will  d/c with close follow up with Dr. Magnus Ivan. Patient is NAD, non-toxic, with stable VS. Patient is informed of clinical course, understands medical decision making process, and agrees with plan. Opportunity for questions provided and all questions answered. Return precautions given.   Final Clinical Impressions(s) / ED Diagnoses   Final diagnoses:  Healing wound  Cat bite of leg, left, subsequent encounter    New Prescriptions Discharge Medication List as of 10/24/2015  6:01 PM       Bethel Born, PA-C 10/24/15 1824    Alvira Monday, MD 10/25/15 2130    Alvira Monday, MD 10/25/15 2141

## 2015-10-25 LAB — URINE CULTURE

## 2015-10-29 LAB — CULTURE, BLOOD (ROUTINE X 2)
Culture: NO GROWTH
Culture: NO GROWTH

## 2015-11-14 ENCOUNTER — Ambulatory Visit (INDEPENDENT_AMBULATORY_CARE_PROVIDER_SITE_OTHER): Payer: Medicare Other | Admitting: Orthopaedic Surgery

## 2015-11-14 DIAGNOSIS — M25512 Pain in left shoulder: Secondary | ICD-10-CM | POA: Diagnosis not present

## 2015-11-14 DIAGNOSIS — G8929 Other chronic pain: Secondary | ICD-10-CM

## 2015-11-14 MED ORDER — METHYLPREDNISOLONE ACETATE 40 MG/ML IJ SUSP
40.0000 mg | INTRAMUSCULAR | Status: AC | PRN
  Administered 2015-11-14: 40 mg via INTRA_ARTICULAR

## 2015-11-14 MED ORDER — LIDOCAINE HCL 1 % IJ SOLN
3.0000 mL | INTRAMUSCULAR | Status: AC | PRN
  Administered 2015-11-14: 3 mL

## 2015-11-14 NOTE — Progress Notes (Signed)
   Office Visit Note   Patient: Crystal CrouchRebecca Crispo           Date of Birth: 07-Dec-1945           MRN: [id number] Visit Date: 11/14/2015              Requested by: Albertina SenegalNelson Pollock, MD [address] [place], [place] [address] PCP: Albertina SenegalPOLLOCK, NELSON, MD   Assessment & Plan: Visit Diagnoses:  1. Chronic left shoulder pain     Plan: She tolerated the left shoulder subacromial injection well. At this point she'll follow-up as needed. If she has any problems with the shoulder she knows to give us a call. The same goes with her left leg as well.  Follow-Up Instructions: No Follow-up on file.   Orders:  Orders Placed This Encounter  Procedures  . Large Joint Injection/Arthrocentesis   No orders of the defined types were placed in this encounter.     Procedures: Large Joint Inj Date/Time: 11/14/2015 9:55 AM Performed by: Kathryne HitchBLACKMAN, Darran Gabay Y Authorized by: Kathryne HitchBLACKMAN, Forrester Blando Y   Indications:  Pain Location:  Shoulder Site:  L subacromial bursa Needle Size:  22 G Approach:  Posterior Ultrasound Guidance: No   Fluoroscopic Guidance: No   Arthrogram: No   Medications:  3 mL lidocaine 1 %; 40 mg methylPREDNISolone acetate 40 MG/ML     Clinical Data: No additional findings.   Subjective: No chief complaint on file. She is following up after having an I&D of her left lower leg from a Bite. She says that area is doing well. Is not really bothering her a lot but she still gets some swelling. She puts Neosporin over the lateral wounds. She has been having some problems recently of her left shoulder with reaching across her and overhead. It wakes her up at night. She denies any specific injury.  HPI  Review of Systems Negative for headache, shortness breath, fever, chills, nausea, vomiting.  Objective: Vital Signs: There were no vitals taken for this visit.  Physical Exam  Ortho Exam Exam remission of her left shoulder shows fluid range of motion. Her rotator cuff is  strong. She does have positive signs of impingement. As far as her left leg goes, the wounds have healed over nicely. Her redness is minimal and there is just slight swelling but no pain. Specialty Comments:  No specialty comments available.  Imaging: No results found.   PMFS History: There are no active problems to display for this patient.  Past Medical History:  Diagnosis Date  . Anxiety   . Asthma   . Depression   . Hypertension     No family history on file.  Past Surgical History:  Procedure Laterality Date  . ABDOMINAL HYSTERECTOMY    . APPENDECTOMY    . CHOLECYSTECTOMY    . KNEE SURGERY Left   . SHOULDER SURGERY Right    Social History   Occupational History  . Not on file.   Social History Main Topics  . Smoking status: Never Smoker  . Smokeless tobacco: Never Used  . Alcohol use No  . Drug use: No  . Sexual activity: Not on file

## 2015-11-28 ENCOUNTER — Encounter: Payer: Self-pay | Admitting: Pulmonary Disease

## 2015-11-28 ENCOUNTER — Ambulatory Visit (INDEPENDENT_AMBULATORY_CARE_PROVIDER_SITE_OTHER): Payer: Medicare Other | Admitting: Pulmonary Disease

## 2015-11-28 ENCOUNTER — Ambulatory Visit (INDEPENDENT_AMBULATORY_CARE_PROVIDER_SITE_OTHER)
Admission: RE | Admit: 2015-11-28 | Discharge: 2015-11-28 | Disposition: A | Discharge status: Home / Self Care | Payer: Medicare Other | Source: Ambulatory Visit | Attending: Pulmonary Disease | Admitting: Pulmonary Disease

## 2015-11-28 VITALS — BP 126/68 | HR 86 | Temp 97.6°F | Ht 64.5 in | Wt 240.0 lb

## 2015-11-28 DIAGNOSIS — R0602 Shortness of breath: Secondary | ICD-10-CM

## 2015-11-28 DIAGNOSIS — J4541 Moderate persistent asthma with (acute) exacerbation: Secondary | ICD-10-CM | POA: Diagnosis not present

## 2015-11-28 DIAGNOSIS — J45901 Unspecified asthma with (acute) exacerbation: Secondary | ICD-10-CM | POA: Insufficient documentation

## 2015-11-28 MED ORDER — PREDNISONE 10 MG PO TABS: ORAL_TABLET | ORAL | 0 refills | Status: DC

## 2015-11-28 NOTE — Progress Notes (Signed)
Subjective:    Patient ID: Crystal CrouchRebecca Jackson, female    DOB: July 10, 1945, 70 y.o.   MRN: [id number]  Synopsis: Patient of Dr. Isaiah SergeMannam with upper airway cough syndrome and asthmatic bronchitis.  HPI Chief Complaint  Patient presents with  . Acute Visit    PM pt being treated for cough/osa presents with increased SOB, prod cough with yellow mucus, sinus congestion, PND X5 days.      Crystal Jackson is her to see me for cough, congestion, and dyspnea.  She has been QVar on "basically a daily basis" but she has been using albuterol regularly lately for these symptoms as well.  She has been taking cough syrup with codeine in it.  Her cough is a little better in the night while on CPAP.  Symptoms started last Wednesday after she was exposed to some sick folks at work.  No fever or chills, but had more dyspnea lately.  Albuterol helps.  She missed some days on the QVar as well.  The cough is productive of yellow mucus and she has a lot of chest congesiton.  She has mild sinus congestion and headache which improved with Advil sinus.  She is now just left with the chest symptoms as described above.    Past Medical History:  Diagnosis Date  . Anxiety   . Asthma   . Depression   . Hypertension       Review of Systems  Constitutional: Positive for fatigue. Negative for chills and fever.  HENT: Positive for postnasal drip, rhinorrhea and sinus pressure.   Respiratory: Positive for cough, chest tightness, shortness of breath and wheezing.   Cardiovascular: Positive for leg swelling. Negative for chest pain and palpitations.       Objective:   Physical Exam Vitals:   11/28/15 1352  BP: 126/68  Pulse: 86  Temp: 97.6 F (36.4 C)  TempSrc: Oral  SpO2: 98%  Weight: 240 lb (108.9 kg)  Height: 5' 4.5" (1.638 m)  RA  Gen: obese but well appearing HENT: OP clear, TM's clear, neck supple PULM: Significant wheezes bilaterally with rhonchi R>L, normal percussion CV: RRR, no mgr, trace edema GI: BS+,  soft, nontender Derm: no cyanosis or rash Psyche: normal mood and affect  CBC    Component Value Date/Time   WBC 5.6 10/24/2015 1306   RBC 4.17 10/24/2015 1306   HGB 12.1 10/24/2015 1306   HCT 36.1 10/24/2015 1306   PLT 234 10/24/2015 1306   MCV 86.6 10/24/2015 1306   MCH 29.0 10/24/2015 1306   MCHC 33.5 10/24/2015 1306   RDW 13.3 10/24/2015 1306   LYMPHSABS 2.1 10/24/2015 1306   MONOABS 0.4 10/24/2015 1306   EOSABS 0.2 10/24/2015 1306   BASOSABS 0.0 10/24/2015 1306     Records from Dr. Isaiah SergeMannam reviewed where she was treated for asthmatic bronchitis per     Assessment & Plan:  Asthma with acute exacerbation She is clearly having an exacerbation of her asthma based on her physical exam today. In fact, I was surprised to hear how abnormal her lung sound. I doubt she has pneumonia but given her abnormal chest findings she needs to have a chest x-ray today to. I think the underlying cause of this was a viral infection so I see no indication for an antibiotic right now. I explained to her that had she been using her Qvar regularly this may have been prevented.  Plan: Resume Qvar routinely Prednisone taper Continue albuterol as needed Chest x-ray today Follow-up if worse  or no improvement    Current Outpatient Prescriptions:  .  albuterol (PROAIR HFA) 108 (90 Base) MCG/ACT inhaler, Inhale into the lungs., Disp: , Rfl:  .  beclomethasone (QVAR) 40 MCG/ACT inhaler, Inhale 2 puffs into the lungs 2 (two) times daily., Disp: 1 Inhaler, Rfl: 5 .  Calcium Carbonate-Vitamin D (CALCIUM-VITAMIN D) 500-200 MG-UNIT tablet, Take by mouth., Disp: , Rfl:  .  chlorpheniramine (CHLOR-TRIMETON) 4 MG tablet, Take 1 tablet (4 mg total) by mouth every 6 (six) hours., Disp: 120 tablet, Rfl: 0 .  dicyclomine (BENTYL) 20 MG tablet, Take 20 mg by mouth 4 (four) times daily -  before meals and at bedtime. , Disp: , Rfl:  .  DULoxetine (CYMBALTA) 60 MG capsule, Take 90 mg by mouth daily. , Disp: , Rfl:  3 .  estradiol (ESTRACE) 1 MG tablet, Take 1 mg by mouth daily. , Disp: , Rfl:  .  fluticasone (FLONASE) 50 MCG/ACT nasal spray, Place 2 sprays into both nostrils daily., Disp: 16 g, Rfl: 5 .  hydrochlorothiazide (HYDRODIURIL) 25 MG tablet, Take 25 mg by mouth daily. , Disp: , Rfl:  .  irbesartan (AVAPRO) 300 MG tablet, Take 300 mg by mouth daily. , Disp: , Rfl:  .  metoprolol tartrate (LOPRESSOR) 25 MG tablet, Take 25-50 mg by mouth 2 (two) times daily. 25 mg in the am and 50 mg at night, Disp: , Rfl:  .  Multiple Vitamin (MULTIVITAMIN) tablet, Take 1 tablet by mouth daily. , Disp: , Rfl:  .  pantoprazole (PROTONIX) 40 MG tablet, Take 40 mg by mouth daily. , Disp: , Rfl:  .  potassium chloride (K-DUR,KLOR-CON) 10 MEQ tablet, Take 10 mEq by mouth daily. , Disp: , Rfl:  .  beclomethasone (QVAR) 40 MCG/ACT inhaler, Inhale 2 puffs into the lungs 2 (two) times daily., Disp: 1 Inhaler, Rfl: 0 .  predniSONE (DELTASONE) 10 MG tablet, Take 40mg  po daily for 3 days, then take 30mg  po daily for 3 days, then take 20mg  po daily for two days, then take 10mg  po daily for 2 days, Disp: 27 tablet, Rfl: 0

## 2015-11-28 NOTE — Assessment & Plan Note (Signed)
She is clearly having an exacerbation of her asthma based on her physical exam today. In fact, I was surprised to hear how abnormal her lung sound. I doubt she has pneumonia but given her abnormal chest findings she needs to have a chest x-ray today to. I think the underlying cause of this was a viral infection so I see no indication for an antibiotic right now. I explained to her that had she been using her Qvar regularly this may have been prevented.  Plan: Resume Qvar routinely Prednisone taper Continue albuterol as needed Chest x-ray today Follow-up if worse or no improvement

## 2015-11-28 NOTE — Patient Instructions (Signed)
Take the prednisone taper as prescribed Keep using albuterol as you were doing Drink plan fluids Let us know if your symptoms worsen, if he had fevers, chills, or mucus production We will see you back as previously planned Use the Qvar regularly

## 2015-12-12 ENCOUNTER — Encounter: Payer: Self-pay | Admitting: Pulmonary Disease

## 2015-12-12 ENCOUNTER — Ambulatory Visit (INDEPENDENT_AMBULATORY_CARE_PROVIDER_SITE_OTHER): Payer: Medicare Other | Admitting: Pulmonary Disease

## 2015-12-12 VITALS — BP 150/90 | HR 77

## 2015-12-12 DIAGNOSIS — J4541 Moderate persistent asthma with (acute) exacerbation: Secondary | ICD-10-CM

## 2015-12-12 LAB — NITRIC OXIDE: NITRIC OXIDE: 10

## 2015-12-12 NOTE — Progress Notes (Signed)
Crystal CrouchRebecca Jackson    [id number]    1945/12/16  Primary Care Physician:POLLOCK, Delton SeeNELSON, MD  Referring Physician: Albertina SenegalNelson Pollock, MD [address] [place], [place] [address]  Chief complaint:   Follow up for Upper airway chronic cough,  Asthmatic bronchitis. GERD OSA on auto CPAP  HPI: [name] is a 70 year old with past medical history of high blood pressure. She had been diagnosed with bronchitis as a child. She reports increasing frequency of attacks of the past few years. Her latest episode was in June 2017. She was treated at that time with an albuterol inhaler and a Z-Pak with no improvement in symptoms. She was subsequently given Symbicort and Cipro with improvement. A chest x-ray done at that time is reported as bronchitis with no infiltrate.  Her symptoms start with sinusitis postnasal drip which then proceeds to cough with yellow mucus, wheezing, chest congestion. She does not report any fevers or chills. She has seasonal allergies, rhinitis, postnasal drip. She has significant issues with acid reflux and heartburn and is on Protonix 40 mg twice a day. She was assessed by GI Dr. Mayford Jackson a few years ago and was told she has a small hiatal hernia. Her current symptoms are chronic daily cough, non-productive sputum. She has symptoms of snoring at night, daytime sleepiness and witnessed apneas as per her husband.  Interim History: She was seen by my partner Dr. Kendrick Jackson last month for an acute exacerbation, viral infection. This was treated with a prednisone taper. She had been taking the Qvar intermittently at that point. She has been instructed to take the Qvar regularly but she is doing now. She has been started on the CPAP and feels it has made a great difference in her sleep. She feels much better now.  Outpatient Encounter Prescriptions as of 12/12/2015  Medication Sig  . albuterol (PROAIR HFA) 108 (90 Base) MCG/ACT inhaler Inhale into the lungs.  . beclomethasone  (QVAR) 40 MCG/ACT inhaler Inhale 2 puffs into the lungs 2 (two) times daily.  . Calcium Carbonate-Vitamin D (CALCIUM-VITAMIN D) 500-200 MG-UNIT tablet Take by mouth.  . chlorpheniramine (CHLOR-TRIMETON) 4 MG tablet Take 1 tablet (4 mg total) by mouth every 6 (six) hours.  Marland Kitchen. dicyclomine (BENTYL) 20 MG tablet Take 20 mg by mouth 4 (four) times daily -  before meals and at bedtime.   . DULoxetine (CYMBALTA) 60 MG capsule Take 90 mg by mouth daily.   Marland Kitchen. estradiol (ESTRACE) 1 MG tablet Take 1 mg by mouth daily.   . fluticasone (FLONASE) 50 MCG/ACT nasal spray Place 2 sprays into both nostrils daily.  . hydrochlorothiazide (HYDRODIURIL) 25 MG tablet Take 25 mg by mouth daily.   . irbesartan (AVAPRO) 300 MG tablet Take 300 mg by mouth daily.   . metoprolol tartrate (LOPRESSOR) 25 MG tablet Take 25-50 mg by mouth 2 (two) times daily. 25 mg in the am and 50 mg at night  . Multiple Vitamin (MULTIVITAMIN) tablet Take 1 tablet by mouth daily.   . pantoprazole (PROTONIX) 40 MG tablet Take 40 mg by mouth daily.   . potassium chloride (K-DUR,KLOR-CON) 10 MEQ tablet Take 10 mEq by mouth daily.   . beclomethasone (QVAR) 40 MCG/ACT inhaler Inhale 2 puffs into the lungs 2 (two) times daily.  . predniSONE (DELTASONE) 10 MG tablet Take 40mg  po daily for 3 days, then take 30mg  po daily for 3 days, then take 20mg  po daily for two days, then take 10mg  po daily for  2 days (Patient not taking: Reported on 12/12/2015)   No facility-administered encounter medications on file as of 12/12/2015.     Allergies as of 12/12/2015 - Review Complete 12/12/2015  Allergen Reaction Noted  . Codeine Nausea And Vomiting 07/12/2015  . Penicillin g Rash 07/12/2015    Past Medical History:  Diagnosis Date  . Anxiety   . Asthma   . Depression   . Hypertension     Past Surgical History:  Procedure Laterality Date  . ABDOMINAL HYSTERECTOMY    . APPENDECTOMY    . CHOLECYSTECTOMY    . KNEE SURGERY Left   . SHOULDER SURGERY  Right     No family history on file.  Social History   Social History  . Marital status: Married    Spouse name: N/A  . Number of children: N/A  . Years of education: N/A   Occupational History  . Not on file.   Social History Main Topics  . Smoking status: Never Smoker  . Smokeless tobacco: Never Used  . Alcohol use No  . Drug use: No  . Sexual activity: Not on file   Other Topics Concern  . Not on file   Social History Narrative  . No narrative on file     Review of systems: Review of Systems  Constitutional: Negative for fever and chills.  HENT: Negative.   Eyes: Negative for blurred vision.  Respiratory: as per HPI  Cardiovascular: Negative for chest pain and palpitations.  Gastrointestinal: Negative for vomiting, diarrhea, blood per rectum. Genitourinary: Negative for dysuria, urgency, frequency and hematuria.  Musculoskeletal: Negative for myalgias, back pain and joint pain.  Skin: Negative for itching and rash.  Neurological: Negative for dizziness, tremors, focal weakness, seizures and loss of consciousness.  Endo/Heme/Allergies: Negative for environmental allergies.  Psychiatric/Behavioral: Negative for depression, suicidal ideas and hallucinations.  All other systems reviewed and are negative.   Physical Exam: There were no vitals taken for this visit. Gen:      No acute distress HEENT:  EOMI, sclera anicteric Neck:     No masses; no thyromegaly Lungs:    Clear to auscultation bilaterally; normal respiratory effort CV:         Regular rate and rhythm; no murmurs Abd:      + bowel sounds; soft, non-tender; no palpable masses, no distension Ext:    No edema; adequate peripheral perfusion Skin:      Warm and dry; no rash Neuro: alert and oriented x 3 Psych: normal mood and affect  Data Reviewed: PFTs 7/44/17 FVC 2.54 [83%] FEV1 1.9 to [83%) F/F 76 TLC 89% DLCO 72%. Minimal obstructive lung disease with mild reduction in diffusion capacity  that corrects for alveolar volume.  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 Low O2 sats of 71%.  CPAP download 11/12/15-12/11/15 AHI 0.3 Average daily use - 7 hrs, 41 minutes.  CXR 11/28/15- No acute cardiopulmonary disease. Images reviewed.  Assessment/Plan: #1 Upper airway cough syndrome, Asthmatic bronchitis. Symptoms are much improved on after recent pred taper. Her PFTs show minimal obstruction. However there is marked reduction in mid flow rates indicating small airway disease with significant improvement post albuterol inhaler. She is now using the qvar regularly and will continue the albuterol as needed.  She will use the chlorphentermine and Flonase nasal spray as needed during periods of increased postnasal drip.  #2 OSA Mild OSA on recent home sleep study. Now on AutoSet CPAP 5- 15 with excellent compliance.  #3 GERD Continues  on protonix 40 mg bid  Plan: - Continue chlorpheniramine, flonase as needed - Continue qvar inhaler. Continue albuterol PRN - Continue Autoset CPAP.  Chilton Greathouse MD [facility] Pager [phone] If no answer or after 3pm call: [phone] 12/12/2015, 11:43 AM  CC: Crystal Senegal, MD

## 2016-01-06 ENCOUNTER — Telehealth: Payer: Self-pay | Admitting: Internal Medicine

## 2016-01-06 MED ORDER — BECLOMETHASONE DIPROPIONATE 40 MCG/ACT IN AERS: 2.0000 | INHALATION_SPRAY | Freq: Two times a day (BID) | RESPIRATORY_TRACT | 0 refills | Status: DC

## 2016-01-06 NOTE — Telephone Encounter (Signed)
2 samples up front for pick up

## 2016-01-06 NOTE — Telephone Encounter (Signed)
Requesting qvar 40 x 2 samples

## 2016-03-07 ENCOUNTER — Ambulatory Visit (INDEPENDENT_AMBULATORY_CARE_PROVIDER_SITE_OTHER): Payer: Medicare Other | Admitting: Orthopedic Surgery

## 2016-06-19 ENCOUNTER — Other Ambulatory Visit: Payer: Self-pay | Admitting: Pulmonary Disease

## 2016-06-19 MED ORDER — BECLOMETHASONE DIPROPIONATE 40 MCG/ACT IN AERS: 2.0000 | INHALATION_SPRAY | Freq: Two times a day (BID) | RESPIRATORY_TRACT | 6 refills | Status: DC

## 2016-06-22 ENCOUNTER — Telehealth: Payer: Self-pay | Admitting: Pulmonary Disease

## 2016-06-22 NOTE — Telephone Encounter (Signed)
Called [phone] to initiate the PA for the Qvar redihaler.   pts ID#  [id number]  This has gone to clinical review and I have called and lmom to make the pt aware. [facility] follow [facility] on ./

## 2016-06-25 NOTE — Telephone Encounter (Signed)
Margie any updates?  Thanks

## 2016-06-26 NOTE — Telephone Encounter (Signed)
Checked PM cubby and forms are in there stating that they need more information for the PA of the qvar redihaler.  [facility] to follow up on.

## 2016-06-27 NOTE — Telephone Encounter (Signed)
Received denial letter for Qvar redihaler from pt's [facility], stating pt must first try and fail Arnuity or Flovent.  PM please advise. Thanks.

## 2016-06-29 NOTE — Telephone Encounter (Signed)
PM please advise. Thanks  

## 2016-07-02 NOTE — Telephone Encounter (Signed)
PM please advise on the medication that you want to send in for the pt.  thanks

## 2016-07-03 MED ORDER — FLUTICASONE FUROATE 200 MCG/ACT IN AEPB: 1.0000 | INHALATION_SPRAY | Freq: Every day | RESPIRATORY_TRACT | 5 refills | Status: DC

## 2016-07-03 NOTE — Telephone Encounter (Signed)
Spoke with pt and made her aware of the change in inhalers. Pt has agreed to try the new medication. Rx was sent to her pharmacy of choice. She had no further questions at this time. Nothing further is needed

## 2016-07-03 NOTE — Telephone Encounter (Signed)
Try arnuity 200 once daily. Thanks.  Chilton GreathousePraveen Parlee Amescua MD

## 2016-12-22 ENCOUNTER — Other Ambulatory Visit: Payer: Self-pay

## 2016-12-22 ENCOUNTER — Encounter (HOSPITAL_BASED_OUTPATIENT_CLINIC_OR_DEPARTMENT_OTHER): Payer: Self-pay | Admitting: Emergency Medicine

## 2016-12-22 ENCOUNTER — Emergency Department (HOSPITAL_BASED_OUTPATIENT_CLINIC_OR_DEPARTMENT_OTHER)
Admission: EM | Admit: 2016-12-22 | Discharge: 2016-12-22 | Disposition: A | Discharge status: Home / Self Care | Payer: Medicare Other | Attending: Emergency Medicine | Admitting: Emergency Medicine

## 2016-12-22 DIAGNOSIS — J45909 Unspecified asthma, uncomplicated: Secondary | ICD-10-CM | POA: Insufficient documentation

## 2016-12-22 DIAGNOSIS — Z79899 Other long term (current) drug therapy: Secondary | ICD-10-CM | POA: Diagnosis not present

## 2016-12-22 DIAGNOSIS — I10 Essential (primary) hypertension: Secondary | ICD-10-CM | POA: Insufficient documentation

## 2016-12-22 DIAGNOSIS — R21 Rash and other nonspecific skin eruption: Secondary | ICD-10-CM | POA: Diagnosis not present

## 2016-12-22 DIAGNOSIS — T7840XA Allergy, unspecified, initial encounter: Secondary | ICD-10-CM | POA: Insufficient documentation

## 2016-12-22 MED ORDER — GLUCAGON HCL RDNA (DIAGNOSTIC) 1 MG IJ SOLR
1.0000 mg | Freq: Once | INTRAMUSCULAR | Status: AC
  Administered 2016-12-22: 1 mg via INTRAVENOUS

## 2016-12-22 MED ORDER — SODIUM CHLORIDE 0.9 % IV BOLUS (SEPSIS)
500.0000 mL | Freq: Once | INTRAVENOUS | Status: AC
  Administered 2016-12-22: 500 mL via INTRAVENOUS

## 2016-12-22 MED ORDER — GLUCAGON HCL RDNA (DIAGNOSTIC) 1 MG IJ SOLR
INTRAMUSCULAR | Status: AC
  Filled 2016-12-22: qty 1

## 2016-12-22 MED ORDER — FAMOTIDINE 20 MG PO TABS: 20.0000 mg | ORAL_TABLET | Freq: Two times a day (BID) | ORAL | 0 refills | Status: DC

## 2016-12-22 MED ORDER — DIPHENHYDRAMINE HCL 50 MG/ML IJ SOLN
25.0000 mg | Freq: Once | INTRAMUSCULAR | Status: AC
  Administered 2016-12-22: 25 mg via INTRAVENOUS

## 2016-12-22 MED ORDER — FAMOTIDINE IN NACL 20-0.9 MG/50ML-% IV SOLN
INTRAVENOUS | Status: AC
  Filled 2016-12-22: qty 50

## 2016-12-22 MED ORDER — METHYLPREDNISOLONE SODIUM SUCC 125 MG IJ SOLR
125.0000 mg | Freq: Once | INTRAMUSCULAR | Status: AC
  Administered 2016-12-22: 125 mg via INTRAVENOUS

## 2016-12-22 MED ORDER — DIPHENHYDRAMINE HCL 50 MG/ML IJ SOLN
25.0000 mg | Freq: Once | INTRAMUSCULAR | Status: AC
  Administered 2016-12-22: 25 mg via INTRAVENOUS
  Filled 2016-12-22: qty 1

## 2016-12-22 MED ORDER — DIPHENHYDRAMINE HCL 50 MG/ML IJ SOLN
INTRAMUSCULAR | Status: AC
  Filled 2016-12-22: qty 1

## 2016-12-22 MED ORDER — METHYLPREDNISOLONE SODIUM SUCC 125 MG IJ SOLR
INTRAMUSCULAR | Status: AC
  Filled 2016-12-22: qty 2

## 2016-12-22 MED ORDER — FAMOTIDINE IN NACL 20-0.9 MG/50ML-% IV SOLN
20.0000 mg | Freq: Once | INTRAVENOUS | Status: AC
  Administered 2016-12-22: 20 mg via INTRAVENOUS

## 2016-12-22 MED ORDER — PREDNISONE 20 MG PO TABS: ORAL_TABLET | ORAL | 0 refills | Status: DC

## 2016-12-22 NOTE — ED Notes (Signed)
Pt discharged to home with family. NAD.  

## 2016-12-22 NOTE — ED Triage Notes (Signed)
Pt reports she suddenly started itching all over and c/o some shob. Pt is able to speak in full sentences

## 2016-12-22 NOTE — ED Provider Notes (Signed)
[facility] EMERGENCY DEPARTMENT Provider Note   CSN: [id number] Arrival date & time: 12/22/16  0020     History   Chief Complaint Chief Complaint  Patient presents with  . Allergic Reaction    HPI Crystal Jackson is a 71 y.o. female.  The history is provided by the patient.  Allergic Reaction  Presenting symptoms: itching and rash   Presenting symptoms: no difficulty breathing, no difficulty swallowing and no wheezing   Severity:  Moderate Prior allergic episodes:  No prior episodes Context: medications   Context comment:  Valtrex and mucinex DM Relieved by:  Nothing Worsened by:  Nothing Ineffective treatments:  None tried   Past Medical History:  Diagnosis Date  . Anxiety   . Asthma   . Depression   . Hypertension     Patient Active Problem List   Diagnosis Date Noted  . Asthma with acute exacerbation 11/28/2015    Past Surgical History:  Procedure Laterality Date  . ABDOMINAL HYSTERECTOMY    . APPENDECTOMY    . CHOLECYSTECTOMY    . KNEE SURGERY Left   . SHOULDER SURGERY Right     OB History    No data available       Home Medications    Prior to Admission medications   Medication Sig Start Date End Date Taking? Authorizing Provider  albuterol (PROAIR HFA) 108 (90 Base) MCG/ACT inhaler Inhale into the lungs. 06/10/15   [provider]  beclomethasone (QVAR) 40 MCG/ACT inhaler Inhale 2 puffs into the lungs 2 (two) times daily. 06/19/16   Mannam, Colbert Coyer, MD  Calcium Carbonate-Vitamin D (CALCIUM-VITAMIN D) 500-200 MG-UNIT tablet Take by mouth.    [provider]  chlorpheniramine (CHLOR-TRIMETON) 4 MG tablet Take 1 tablet (4 mg total) by mouth every 6 (six) hours. 09/09/15   Mannam, Colbert Coyer, MD  dicyclomine (BENTYL) 20 MG tablet Take 20 mg by mouth 4 (four) times daily -  before meals and at bedtime.     [provider]  DULoxetine (CYMBALTA) 60 MG capsule Take 90 mg by mouth daily.  08/25/15   [provider]  estradiol (ESTRACE) 1 MG tablet Take 1 mg by mouth daily.     [provider]  famotidine (PEPCID) 20 MG tablet Take 1 tablet (20 mg total) by mouth 2 (two) times daily. 12/22/16   Sherrin Stahle, MD  fluticasone (FLONASE) 50 MCG/ACT nasal spray Place 2 sprays into both nostrils daily. 09/12/15   Mannam, Colbert Coyer, MD  Fluticasone Furoate (ARNUITY ELLIPTA) 200 MCG/ACT AEPB Inhale 1 puff into the lungs daily. 07/03/16   Mannam, Colbert Coyer, MD  hydrochlorothiazide (HYDRODIURIL) 25 MG tablet Take 25 mg by mouth daily.  02/24/15   [provider]  irbesartan (AVAPRO) 300 MG tablet Take 300 mg by mouth daily.  01/17/15   [provider]  metoprolol tartrate (LOPRESSOR) 25 MG tablet Take 25-50 mg by mouth 2 (two) times daily. 25 mg in the am and 50 mg at night 01/27/15   [provider]  Multiple Vitamin (MULTIVITAMIN) tablet Take 1 tablet by mouth daily.     [provider]  pantoprazole (PROTONIX) 40 MG tablet Take 40 mg by mouth daily.  01/31/15   [provider]  potassium chloride (K-DUR,KLOR-CON) 10 MEQ tablet Take 10 mEq by mouth daily.  01/31/15   [provider]  predniSONE (DELTASONE) 10 MG tablet Take 40mg  po daily for 3 days, then take 30mg  po daily for 3 days, then take 20mg  po  daily for two days, then take 10mg  po daily for 2 days Patient not taking: Reported on 12/12/2015 11/28/15   Lupita LeashMcQuaid, Douglas B, MD  predniSONE (DELTASONE) 20 MG tablet 3 tabs po day one, then 2 po daily x 4 days 12/22/16   Nicanor AlconPalumbo, Clarann Helvey, MD    Family History No family history on file.  Social History Social History   Tobacco Use  . Smoking status: Never Smoker  . Smokeless tobacco: Never Used  Substance Use Topics  . Alcohol use: No    Alcohol/week: 0.0 oz  . Drug use: No     Allergies   Dextromethorphan; Guaifenesin & derivatives; Valtrex [valacyclovir]; Codeine; and Penicillin g   Review of Systems Review of Systems  HENT: Negative for  drooling, facial swelling and trouble swallowing.   Respiratory: Negative for shortness of breath, wheezing and stridor.   Cardiovascular: Negative for chest pain.  Skin: Positive for itching and rash.  All other systems reviewed and are negative.    Physical Exam Updated Vital Signs BP (!) 113/55   Pulse 78   Temp (!) 97.5 F (36.4 C) (Oral)   Resp 17   Ht 5\' 4"  (1.626 m)   Wt 99.8 kg (220 lb)   SpO2 100%   BMI 37.76 kg/m   Physical Exam  Constitutional: She is oriented to person, place, and time. She appears well-developed and well-nourished.  HENT:  Head: Normocephalic and atraumatic.  No swelling of the lips tongue or uvula mallempati class 2  Eyes: Conjunctivae are normal. Pupils are equal, round, and reactive to light.  Neck: Normal range of motion. Neck supple.  Intact phonation  Cardiovascular: Normal rate, regular rhythm, normal heart sounds and intact distal pulses.  Pulmonary/Chest: Effort normal and breath sounds normal. No stridor. No respiratory distress. She has no wheezes. She has no rales. She exhibits no tenderness.  Abdominal: Soft. Bowel sounds are normal. She exhibits no mass. There is no tenderness. There is no rebound and no guarding.  Musculoskeletal: Normal range of motion.  Neurological: She is alert and oriented to person, place, and time. She displays normal reflexes.  Skin: Skin is warm and dry. Capillary refill takes less than 2 seconds. She is not diaphoretic.  Confluent hives and erythroderma sparing hands feet neck and head  Psychiatric: She has a normal mood and affect.     ED Treatments / Results   Vitals:   12/22/16 0330 12/22/16 0345  BP: 102/85 (!) 113/55  Pulse: 73 78  Resp: (!) 32 17  Temp:    SpO2: 94% 100%    Procedures Procedures (including critical care time)  Medications Ordered in ED Medications  diphenhydrAMINE (BENADRYL) injection 25 mg (25 mg Intravenous Given 12/22/16 0050)  famotidine (PEPCID) IVPB 20 mg  premix (0 mg Intravenous Stopped 12/22/16 0142)  glucagon (human recombinant) (GLUCAGEN) injection 1 mg (1 mg Intravenous Given 12/22/16 0052)  methylPREDNISolone sodium succinate (SOLU-MEDROL) 125 mg/2 mL injection 125 mg (125 mg Intravenous Given 12/22/16 0101)  diphenhydrAMINE (BENADRYL) injection 25 mg (25 mg Intravenous Given 12/22/16 0149)  sodium chloride 0.9 % bolus 500 mL (0 mLs Intravenous Stopped 12/22/16 0227)       Final Clinical Impressions(s) / ED Diagnoses   Final diagnoses:  Allergic reaction, initial encounter   Stop the above medications, recheck Monday with your PMD.  Patient verbalizes understanding and agrees to follow up.     Return for recurrent rash, shortness of breath swelling or the lips tongue or mouth, fever >  100.4, ascending weakness or numbness, stiff neck, intractable vomiting, or diarrhea, abdominal pain, Inability to tolerate liquids or food, cough, altered mental status or any concerns. No signs of systemic illness or infection. The patient is nontoxic-appearing on exam and vital signs are within normal limits.    I have reviewed the triage vital signs and the nursing notes. Pertinent labs &imaging results that were available during my care of the patient were reviewed by me and considered in my medical decision making (see chart for details).  After history, exam, and medical workup I feel the patient has been appropriately medically screened and is safe for discharge home. Pertinent diagnoses were discussed with the patient. Patient was given return precautions    ED Discharge Orders        Ordered    predniSONE (DELTASONE) 20 MG tablet     12/22/16 0353    famotidine (PEPCID) 20 MG tablet  2 times daily     12/22/16 0353       Lajoyce Tamura, MD 12/22/16 [phone]

## 2017-01-19 ENCOUNTER — Emergency Department (HOSPITAL_COMMUNITY)
Admission: EM | Admit: 2017-01-19 | Discharge: 2017-01-19 | Disposition: A | Discharge status: Home / Self Care | Payer: Medicare Other | Attending: Emergency Medicine | Admitting: Emergency Medicine

## 2017-01-19 ENCOUNTER — Encounter (HOSPITAL_COMMUNITY): Payer: Self-pay | Admitting: Emergency Medicine

## 2017-01-19 ENCOUNTER — Other Ambulatory Visit: Payer: Self-pay

## 2017-01-19 DIAGNOSIS — H5789 Other specified disorders of eye and adnexa: Secondary | ICD-10-CM | POA: Diagnosis present

## 2017-01-19 DIAGNOSIS — B309 Viral conjunctivitis, unspecified: Secondary | ICD-10-CM | POA: Diagnosis not present

## 2017-01-19 DIAGNOSIS — L509 Urticaria, unspecified: Secondary | ICD-10-CM

## 2017-01-19 MED ORDER — FAMOTIDINE 20 MG PO TABS: 20.0000 mg | ORAL_TABLET | Freq: Two times a day (BID) | ORAL | 0 refills | Status: DC

## 2017-01-19 NOTE — Discharge Instructions (Signed)
Stop taking your qysmia.  Take benadryl 25-50 mg every 4 to 6 hours as needed for hives.  Get rechecked immediately if you develop dizziness, difficulty breathing or throat swelling.

## 2017-01-19 NOTE — ED Notes (Signed)
Bed: [facility] Expected date:  Expected time:  Means of arrival:  Comments: 

## 2017-01-19 NOTE — ED Notes (Signed)
Pt c/o a recurring rash that went away in the past after taking benadryl. She took 25 mg of benadryl at 04:00 this morning and 25 mg again at 04:30.

## 2017-01-19 NOTE — ED Triage Notes (Signed)
Patient states she had an allergic reaction. Patient does not know what she is allergic to. Patient states she has not had anything new. Patient had 50 mg of benadryl and 100 cc bolus of ns in the EMS truck.

## 2017-01-19 NOTE — ED Provider Notes (Signed)
[facility]-EMERGENCY DEPT Provider Note   CSN: [id number] Arrival date & time: 01/19/17  4098     History   Chief Complaint Chief Complaint  Patient presents with  . Allergic Reaction    HPI Connee Ikner is a 72 y.o. female.  The history is provided by the patient. No language interpreter was used.  Allergic Reaction    Adrina Armijo is a 72 y.o. female who presents to the Emergency Department complaining of allergic reaction.  She presents for evaluation of hives that began at 4 AM.  She did have mild hives yesterday during the day but she took a Benadryl, 25 mg p.o. once and they resolved.  When she awoke at 4 in the morning she had diffuse urticaria and took another Benadryl with no improvement in her symptoms.  At 430 she took a second Benadryl but her hives continued.  She was going to present to the emergency department for further evaluation when she became lightheaded and felt poorly and 911 was called.  She had no shortness of breath, chest pain, throat swelling, nausea, vomiting, abdominal pain.  EMS had a blood pressure of 122/94 and she received 100 cc bolus of normal saline.  In the emergency department she states that her symptoms are significantly improved and her hives have resolved.  She no longer complains of dizziness.  She did restart a weight loss medication on Tuesday, qysmia.  She has experienced a similar episode with hives and hypotension in mid December.  No new food exposures.  Past Medical History:  Diagnosis Date  . Anxiety   . Asthma   . Depression   . Hypertension     Patient Active Problem List   Diagnosis Date Noted  . Asthma with acute exacerbation 11/28/2015    Past Surgical History:  Procedure Laterality Date  . ABDOMINAL HYSTERECTOMY    . APPENDECTOMY    . CHOLECYSTECTOMY    . KNEE SURGERY Left   . SHOULDER SURGERY Right     OB History    No data available       Home Medications    Prior to Admission  medications   Medication Sig Start Date End Date Taking? Authorizing Provider  albuterol (PROAIR HFA) 108 (90 Base) MCG/ACT inhaler Inhale into the lungs. 06/10/15   [provider]  beclomethasone (QVAR) 40 MCG/ACT inhaler Inhale 2 puffs into the lungs 2 (two) times daily. 06/19/16   Mannam, Colbert Coyer, MD  Calcium Carbonate-Vitamin D (CALCIUM-VITAMIN D) 500-200 MG-UNIT tablet Take by mouth.    [provider]  chlorpheniramine (CHLOR-TRIMETON) 4 MG tablet Take 1 tablet (4 mg total) by mouth every 6 (six) hours. 09/09/15   Mannam, Colbert Coyer, MD  dicyclomine (BENTYL) 20 MG tablet Take 20 mg by mouth 4 (four) times daily -  before meals and at bedtime.     [provider]  DULoxetine (CYMBALTA) 60 MG capsule Take 90 mg by mouth daily.  08/25/15   [provider]  estradiol (ESTRACE) 1 MG tablet Take 1 mg by mouth daily.     [provider]  famotidine (PEPCID) 20 MG tablet Take 1 tablet (20 mg total) by mouth 2 (two) times daily. 01/19/17   Tilden Fossa, MD  fluticasone [facility]) 50 MCG/ACT nasal spray Place 2 sprays into both nostrils daily. 09/12/15   Mannam, Colbert Coyer, MD  Fluticasone Furoate (ARNUITY ELLIPTA) 200 MCG/ACT AEPB Inhale 1 puff into the lungs daily. 07/03/16   Chilton Greathouse, MD  hydrochlorothiazide (HYDRODIURIL)  25 MG tablet Take 25 mg by mouth daily.  02/24/15   [provider]  irbesartan (AVAPRO) 300 MG tablet Take 300 mg by mouth daily.  01/17/15   [provider]  metoprolol tartrate (LOPRESSOR) 25 MG tablet Take 25-50 mg by mouth 2 (two) times daily. 25 mg in the am and 50 mg at night 01/27/15   [provider]  Multiple Vitamin (MULTIVITAMIN) tablet Take 1 tablet by mouth daily.     [provider]  pantoprazole (PROTONIX) 40 MG tablet Take 40 mg by mouth daily.  01/31/15   [provider]  potassium chloride (K-DUR,KLOR-CON) 10 MEQ tablet Take 10 mEq by mouth daily.  01/31/15   [provider]    predniSONE (DELTASONE) 10 MG tablet Take 40mg  po daily for 3 days, then take 30mg  po daily for 3 days, then take 20mg  po daily for two days, then take 10mg  po daily for 2 days Patient not taking: Reported on 12/12/2015 11/28/15   Lupita Leash, MD  predniSONE (DELTASONE) 20 MG tablet 3 tabs po day one, then 2 po daily x 4 days 12/22/16   Nicanor Alcon, April, MD    Family History History reviewed. No pertinent family history.  Social History Social History   Tobacco Use  . Smoking status: Never Smoker  . Smokeless tobacco: Never Used  Substance Use Topics  . Alcohol use: No    Alcohol/week: 0.0 oz  . Drug use: No     Allergies   Dextromethorphan; Guaifenesin & derivatives; Valtrex [valacyclovir]; Codeine; and Penicillin g   Review of Systems Review of Systems  All other systems reviewed and are negative.    Physical Exam Updated Vital Signs BP 128/67 (BP Location: Right Arm)   Pulse 86   Temp (!) 97.5 F (36.4 C) (Oral)   Resp 15   Ht 5\' 4"  (1.626 m)   Wt 99.8 kg (220 lb)   SpO2 99%   BMI 37.76 kg/m   Physical Exam  Constitutional: She is oriented to person, place, and time. She appears well-developed and well-nourished.  HENT:  Head: Normocephalic and atraumatic.  Dry mucous membranes  Cardiovascular: Normal rate and regular rhythm.  No murmur heard. Pulmonary/Chest: Effort normal and breath sounds normal. No respiratory distress.  Abdominal: Soft. There is no tenderness. There is no rebound and no guarding.  Musculoskeletal: She exhibits no edema or tenderness.  Neurological: She is alert and oriented to person, place, and time.  Skin: Skin is warm and dry.  Psychiatric: She has a normal mood and affect. Her behavior is normal.  Nursing note and vitals reviewed.    ED Treatments / Results  Labs (all labs ordered are listed, but only abnormal results are displayed) Labs Reviewed - No data to display  EKG  EKG Interpretation None        Radiology No results found.  Procedures Procedures (including critical care time)  Medications Ordered in ED Medications - No data to display   Initial Impression / Assessment and Plan / ED Course  I have reviewed the triage vital signs and the nursing notes.  Pertinent labs & imaging results that were available during my care of the patient were reviewed by me and considered in my medical decision making (see chart for details).     Patient here for evaluation of urticaria, resolved on evaluation in the emergency department after she received Benadryl prior to ED arrival.  She did have dizziness prior to presentation but no  noted hypotension by EMS.  Currently she is asymptomatic in the department and well-appearing.  Presentation is not consistent with anaphylaxis.  Discussed discontinuing her weight loss medication as this may be a contributing factor.  Counseled patient on home care for urticaria with symptom control with Benadryl and Pepcid.  Discussed close return precautions if she develops any symptoms of worsening reaction.  Final Clinical Impressions(s) / ED Diagnoses   Final diagnoses:  Urticaria    ED Discharge Orders        Ordered    famotidine (PEPCID) 20 MG tablet  2 times daily     01/19/17 0753       Tilden Fossaees, Cledith Abdou, MD 01/19/17 [phone]

## 2017-02-26 DIAGNOSIS — J4520 Mild intermittent asthma, uncomplicated: Secondary | ICD-10-CM | POA: Insufficient documentation

## 2017-04-05 ENCOUNTER — Other Ambulatory Visit: Payer: Self-pay | Admitting: Pulmonary Disease

## 2017-04-15 ENCOUNTER — Encounter: Payer: Self-pay | Admitting: Pulmonary Disease

## 2017-04-15 ENCOUNTER — Ambulatory Visit: Payer: Medicare Other | Admitting: Pulmonary Disease

## 2017-04-15 VITALS — BP 140/80 | HR 77 | Ht 64.0 in | Wt 226.0 lb

## 2017-04-15 DIAGNOSIS — J4541 Moderate persistent asthma with (acute) exacerbation: Secondary | ICD-10-CM

## 2017-04-15 DIAGNOSIS — R05 Cough: Secondary | ICD-10-CM

## 2017-04-15 DIAGNOSIS — G4733 Obstructive sleep apnea (adult) (pediatric): Secondary | ICD-10-CM

## 2017-04-15 DIAGNOSIS — R059 Cough, unspecified: Secondary | ICD-10-CM

## 2017-04-15 LAB — NITRIC OXIDE: NITRIC OXIDE: 18

## 2017-04-15 NOTE — Patient Instructions (Signed)
We will renew your inhalers Continue to work on weight loss Continue CPAP Use chlorpheniramine over-the-counter as needed for allergies Will make a follow-up appointment in a year.  Please call us sooner if there is any change in your breathing.

## 2017-04-15 NOTE — Progress Notes (Signed)
Crystal CrouchRebecca Jackson    [id number]    Nov 02, 1945  Primary Care Physician:Pollock, Delton SeeNelson, MD  Referring Physician: Albertina SenegalPollock, Nelson, MD [address] [place], [place] [address]  Chief complaint:   Follow up for Upper airway chronic cough,  Asthmatic bronchitis. GERD OSA on auto CPAP  HPI: [name] is a 72 year old with past medical history of high blood pressure. She had been diagnosed with bronchitis as a child. She reports increasing frequency of attacks of the past few years. Her latest episode was in Nov 2017  Her symptoms start with sinusitis postnasal drip which then proceeds to cough with yellow mucus, wheezing, chest congestion. She does not report any fevers or chills. She has seasonal allergies, rhinitis, postnasal drip. She has significant issues with acid reflux and heartburn and is on Protonix 40 mg twice a day. She was assessed by GI Dr. Mayford KnifeWilliams a few years ago and was told she has a small hiatal hernia.  Interim History: Doing well on arnuity nhaler.  She ran out of her steroid inhaler 3 days ago and notes increasing cough Also has worsening allergies during pollen season. Using CPAP without issue She is working on weight loss.  She had an ED visit in January 2019 for rash which is thought to be an allergic reaction to irbesartan. Symptoms have improved after the medication was held.  Outpatient Encounter Medications as of 04/15/2017  Medication Sig  . albuterol (PROAIR HFA) 108 (90 Base) MCG/ACT inhaler Inhale into the lungs.  . Calcium Carbonate-Vitamin D (CALCIUM-VITAMIN D) 500-200 MG-UNIT tablet Take by mouth.  . chlorpheniramine (CHLOR-TRIMETON) 4 MG tablet Take 1 tablet (4 mg total) by mouth every 6 (six) hours.  Marland Kitchen. dicyclomine (BENTYL) 20 MG tablet Take 20 mg by mouth 4 (four) times daily -  before meals and at bedtime.   Marland Kitchen. estradiol (ESTRACE) 1 MG tablet Take 1 mg by mouth daily.   . famotidine (PEPCID) 20 MG tablet Take 1 tablet (20 mg total) by  mouth 2 (two) times daily.  . fluticasone (FLONASE) 50 MCG/ACT nasal spray Place 2 sprays into both nostrils daily.  . Fluticasone Furoate (ARNUITY ELLIPTA) 200 MCG/ACT AEPB Inhale 1 puff into the lungs daily.  . hydrochlorothiazide (HYDRODIURIL) 25 MG tablet Take 25 mg by mouth daily.   . metoprolol tartrate (LOPRESSOR) 25 MG tablet Take 25-50 mg by mouth 2 (two) times daily. 25 mg in the am and 50 mg at night  . Multiple Vitamin (MULTIVITAMIN) tablet Take 1 tablet by mouth daily.   . pantoprazole (PROTONIX) 40 MG tablet Take 40 mg by mouth daily.   . potassium chloride (K-DUR,KLOR-CON) 10 MEQ tablet Take 10 mEq by mouth daily.   . [DISCONTINUED] predniSONE (DELTASONE) 10 MG tablet Take 40mg  po daily for 3 days, then take 30mg  po daily for 3 days, then take 20mg  po daily for two days, then take 10mg  po daily for 2 days  . [DISCONTINUED] predniSONE (DELTASONE) 20 MG tablet 3 tabs po day one, then 2 po daily x 4 days  . [DISCONTINUED] beclomethasone (QVAR) 40 MCG/ACT inhaler Inhale 2 puffs into the lungs 2 (two) times daily.  . [DISCONTINUED] DULoxetine (CYMBALTA) 60 MG capsule Take 90 mg by mouth daily.   . [DISCONTINUED] irbesartan (AVAPRO) 300 MG tablet Take 300 mg by mouth daily.    No facility-administered encounter medications on file as of 04/15/2017.     Allergies as of 04/15/2017 - Review Complete 04/15/2017  Allergen Reaction  Noted  . Dextromethorphan Hives and Shortness Of Breath 12/22/2016  . Guaifenesin & derivatives Hives and Shortness Of Breath 12/22/2016  . Valtrex [valacyclovir] Hives and Shortness Of Breath 12/22/2016  . Codeine Nausea And Vomiting 07/12/2015  . Avapro [irbesartan] Rash 04/15/2017  . Penicillin g Rash 07/12/2015    Past Medical History:  Diagnosis Date  . Anxiety   . Asthma   . Depression   . Hypertension     Past Surgical History:  Procedure Laterality Date  . ABDOMINAL HYSTERECTOMY    . APPENDECTOMY    . CHOLECYSTECTOMY    . KNEE SURGERY Left    . SHOULDER SURGERY Right     No family history on file.  Social History   Socioeconomic History  . Marital status: Married    Spouse name: Not on file  . Number of children: Not on file  . Years of education: Not on file  . Highest education level: Not on file  Occupational History  . Not on file  Social Needs  . Financial resource strain: Not on file  . Food insecurity:    Worry: Not on file    Inability: Not on file  . Transportation needs:    Medical: Not on file    Non-medical: Not on file  Tobacco Use  . Smoking status: Never Smoker  . Smokeless tobacco: Never Used  Substance and Sexual Activity  . Alcohol use: No    Alcohol/week: 0.0 oz  . Drug use: No  . Sexual activity: Not on file  Lifestyle  . Physical activity:    Days per week: Not on file    Minutes per session: Not on file  . Stress: Not on file  Relationships  . Social connections:    Talks on phone: Not on file    Gets together: Not on file    Attends religious service: Not on file    Active member of club or organization: Not on file    Attends meetings of clubs or organizations: Not on file    Relationship status: Not on file  . Intimate partner violence:    Fear of current or ex partner: Not on file    Emotionally abused: Not on file    Physically abused: Not on file    Forced sexual activity: Not on file  Other Topics Concern  . Not on file  Social History Narrative  . Not on file    Review of systems: Review of Systems  Constitutional: Negative for fever and chills.  HENT: Negative.   Eyes: Negative for blurred vision.  Respiratory: as per HPI  Cardiovascular: Negative for chest pain and palpitations.  Gastrointestinal: Negative for vomiting, diarrhea, blood per rectum. Genitourinary: Negative for dysuria, urgency, frequency and hematuria.  Musculoskeletal: Negative for myalgias, back pain and joint pain.  Skin: Negative for itching and rash.  Neurological: Negative for  dizziness, tremors, focal weakness, seizures and loss of consciousness.  Endo/Heme/Allergies: Negative for environmental allergies.  Psychiatric/Behavioral: Negative for depression, suicidal ideas and hallucinations.  All other systems reviewed and are negative.  Physical Exam: Blood pressure 140/80, pulse 77, height 5\' 4"  (1.626 m), weight 226 lb (102.5 kg), SpO2 98 %. Gen:      No acute distress HEENT:  EOMI, sclera anicteric Neck:     No masses; no thyromegaly Lungs:    Clear to auscultation bilaterally; normal respiratory effort CV:         Regular rate and rhythm; no murmurs Abd:      +  bowel sounds; soft, non-tender; no palpable masses, no distension Ext:    No edema; adequate peripheral perfusion Skin:      Warm and dry; no rash Neuro: alert and oriented x 3 Psych: normal mood and affect  Data Reviewed: PFTs 7/44/17 FVC 2.54 [83%], FEV1 1.9 to [83%), F/F 76, TLC 89%, DLCO 72%. Minimal obstructive lung disease with mild reduction in diffusion capacity that corrects for alveolar volume.  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 Low O2 sats of 71%.  CPAP download 11/12/15-12/11/15 AHI 0.3 Average daily use - 7 hrs, 41 minutes.  CXR 11/28/15- No acute cardiopulmonary disease. Images reviewed.  FENO 04/15/17-18  Assessment/Plan: #1 Upper airway cough syndrome, Asthmatic bronchitis. Her PFTs show minimal obstruction. However there is marked reduction in mid flow rates indicating small airway disease with significant improvement post albuterol inhaler.   Symptoms are stable on steroid inhaler.  It appears that she needs to continue on this as she has worsening symptoms after she ran out of inhalers over the past weekend.  We will renew prescription for arnuity.  Use chlorphentermine and Flonase nasal spray as needed during periods of increased postnasal drip.  #2 OSA Mild OSA on recent home sleep study.  Continues on AutoSet CPAP 5- 15 Obtain download for review.   #3  GERD Continues on protonix 40 mg bid  Plan: - Continue chlorpheniramine, flonase as needed - Continue arnuity inhaler, albuterol PRN -  Autoset CPAP. Work on weight loss.  Chilton Greathouse MD [facility] If no answer or after 3pm call: [phone] 04/15/2017, 11:18 AM  CC: Albertina Senegal, MD

## 2017-04-16 ENCOUNTER — Telehealth: Payer: Self-pay | Admitting: Pulmonary Disease

## 2017-04-16 MED ORDER — FLUTICASONE FUROATE 200 MCG/ACT IN AEPB: 1.0000 | INHALATION_SPRAY | Freq: Every day | RESPIRATORY_TRACT | 5 refills | Status: DC

## 2017-04-16 NOTE — Telephone Encounter (Signed)
Called and spoke with patient, she states that her pharmacy has not received her refill. Per Dr. Silvestre GunnerMannan OV note this was to be called in. Refill sent to pharmacy.

## 2017-06-10 ENCOUNTER — Other Ambulatory Visit (INDEPENDENT_AMBULATORY_CARE_PROVIDER_SITE_OTHER): Payer: Self-pay | Admitting: Physician Assistant

## 2017-06-10 ENCOUNTER — Ambulatory Visit (INDEPENDENT_AMBULATORY_CARE_PROVIDER_SITE_OTHER): Payer: Medicare Other | Admitting: Physician Assistant

## 2017-06-10 ENCOUNTER — Ambulatory Visit (INDEPENDENT_AMBULATORY_CARE_PROVIDER_SITE_OTHER): Payer: Medicare Other

## 2017-06-10 ENCOUNTER — Encounter (INDEPENDENT_AMBULATORY_CARE_PROVIDER_SITE_OTHER): Payer: Self-pay | Admitting: Physician Assistant

## 2017-06-10 DIAGNOSIS — M1712 Unilateral primary osteoarthritis, left knee: Secondary | ICD-10-CM

## 2017-06-10 MED ORDER — METHYLPREDNISOLONE ACETATE 40 MG/ML IJ SUSP
40.0000 mg | INTRAMUSCULAR | Status: AC | PRN
Start: 2017-06-10 — End: 2017-06-10
  Administered 2017-06-10: 40 mg via INTRA_ARTICULAR

## 2017-06-10 MED ORDER — LIDOCAINE HCL 1 % IJ SOLN
5.0000 mL | INTRAMUSCULAR | Status: AC | PRN
  Administered 2017-06-10: 5 mL

## 2017-06-10 MED ORDER — DICLOFENAC SODIUM 75 MG PO TBEC: DELAYED_RELEASE_TABLET | ORAL | 1 refills | Status: DC

## 2017-06-10 NOTE — Progress Notes (Signed)
Office Visit Note   Patient: Neil CrouchRebecca Huezo           Date of Birth: Aug 04, 1945           MRN: [id number] Visit Date: 06/10/2017              Requested by: Albertina SenegalPollock, Nelson, MD [address] [place], [place] [address] PCP: Albertina SenegalPollock, Nelson, MD   Assessment & Plan: Visit Diagnoses:  1. Primary osteoarthritis of left knee     Plan: We will send her to physical therapy for quad strengthening both knees.  She will continue to work on weight loss.  Discussed with her knee friendly exercises.  At this point time she is not interested in any knee surgery would like to exhaust all conservative measures before receiving over the total knee arthroplasty.  Place her on diclofenac no other NSAIDs while on this.  She is given a hinged knee brace.  Follow-Up Instructions: Return in about 2 weeks (around 06/24/2017).   Orders:  Orders Placed This Encounter  Procedures  . XR Knee 1-2 Views Left  . Ambulatory referral to Physical Therapy   Meds ordered this encounter  Medications  . DISCONTD: diclofenac (VOLTAREN) 75 MG EC tablet    Sig: Take 1 tablet by mouth twice a day with food for two weeks, then as needed after that    Dispense:  60 tablet    Refill:  1      Procedures: Large Joint Inj: L knee on 06/10/2017 3:10 PM Indications: pain Details: 22 G 1.5 in needle, superolateral approach  Arthrogram: No  Medications: 40 mg methylPREDNISolone acetate 40 MG/ML; 5 mL lidocaine 1 % Aspirate: 45 mL blood-tinged Outcome: tolerated well, no immediate complications Procedure, treatment alternatives, risks and benefits explained, specific risks discussed. Consent was given by the patient. Immediately prior to procedure a time out was called to verify the correct patient, procedure, equipment, support staff and site/side marked as required. Patient was prepped and draped in the usual sterile fashion.       Clinical Data: No additional findings.   Subjective: Chief Complaint  Patient  presents with  . Left Knee - Pain    HPI [name] is a 72 year old female known to Dr. Magnus IvanBlackman service comes in today due to left knee pain that she is had for years.  She is been seeing an orthopedic doctor there exclusively disc for his knee but he retired.  She is wanting for us to now take over the care of her knee.  States 2 months ago that she had the knee aspirated and injected with cortisone was actually almost 3 months ago as it occurred on 03/19/2017.  She has had supplemental injections in the knee in the past.  She had a knee arthroscopy approximately 40 years ago left knee.  Is having a flareup of her knee now.  She states that it is impairing her lifestyle.  She has swelling she has difficulty getting up and down due to knee pain.  She feels like both legs are weak.  She has been working on weight loss in the last year lost some 25 pounds on a low-carb diet.  She is tried to [employer] Advil and Aleve without any real relief of the knee pain.  No acute knee injury.  Review of Systems Negative outside the HPI.  Objective: Vital Signs: There were no vitals taken for this visit.  Physical Exam  Constitutional: She is oriented to person, place, and time.  She appears well-developed and well-nourished.  Pulmonary/Chest: Effort normal.  Neurological: She is alert and oriented to person, place, and time.  Skin: She is not diaphoretic.  Psychiatric: She has a normal mood and affect.    Ortho Exam Bilateral knees no rashes skin lesions ulcerations erythema or ecchymosis.  Left knee with slight effusion.  Right knee no effusion.  No instability valgus varus stressing of either knee.  Right knee overall good range of motion.  Will left knee she lacks last few degrees of extension.  She has significant patellofemoral crepitus left knee.  Left knee with tenderness along medial lateral joint line Specialty Comments:  No specialty comments available.  Imaging: Xr Knee 1-2 Views Left  Result  Date: 06/10/2017 Left knee: No dislocation subluxation.  Medial compartment with moderate to severe medial compartmental narrowing.  Lateral compartment with mild to moderate narrowing and osteophytes.  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    PMFS History: Patient Active Problem List   Diagnosis Date Noted  . Asthma with acute exacerbation 11/28/2015   Past Medical History:  Diagnosis Date  . Anxiety   . Asthma   . Depression   . Hypertension     No family history on file.  Past Surgical History:  Procedure Laterality Date  . ABDOMINAL HYSTERECTOMY    . APPENDECTOMY    . CHOLECYSTECTOMY    . KNEE SURGERY Left   . SHOULDER SURGERY Right    Social History   Occupational History  . Not on file  Tobacco Use  . Smoking status: Never Smoker  . Smokeless tobacco: Never Used  Substance and Sexual Activity  . Alcohol use: No    Alcohol/week: 0.0 oz  . Drug use: No  . Sexual activity: Not on file

## 2017-06-19 ENCOUNTER — Other Ambulatory Visit: Payer: Self-pay

## 2017-06-19 ENCOUNTER — Ambulatory Visit: Payer: Medicare Other | Attending: Physician Assistant | Admitting: Physical Therapy

## 2017-06-19 ENCOUNTER — Encounter: Payer: Self-pay | Admitting: Physical Therapy

## 2017-06-19 DIAGNOSIS — M25562 Pain in left knee: Secondary | ICD-10-CM | POA: Insufficient documentation

## 2017-06-19 DIAGNOSIS — M25662 Stiffness of left knee, not elsewhere classified: Secondary | ICD-10-CM | POA: Diagnosis present

## 2017-06-19 DIAGNOSIS — G8929 Other chronic pain: Secondary | ICD-10-CM | POA: Diagnosis present

## 2017-06-19 DIAGNOSIS — R262 Difficulty in walking, not elsewhere classified: Secondary | ICD-10-CM

## 2017-06-19 DIAGNOSIS — M6281 Muscle weakness (generalized): Secondary | ICD-10-CM | POA: Insufficient documentation

## 2017-06-19 NOTE — Therapy (Signed)
[facility] [address]  [address] [place], [place], [address] Phone: [phone]   Fax:  [phone]  Physical Therapy Evaluation  Patient Details  Name: Crystal Jackson MRN: [id number] Date of Birth: 1945/05/31 Referring Provider: Richardean Canal, MD   Encounter Date: 06/19/2017  PT End of Session - 06/19/17 1755    Visit Number  1    Number of Visits  13    Date for PT Re-Evaluation  07/31/17    Authorization Type  [employer] Medicare    PT Start Time  1612    PT Stop Time  1700    PT Time Calculation (min)  48 min    Activity Tolerance  Patient tolerated treatment well    Behavior During Therapy  [facility] for tasks assessed/performed       Past Medical History:  Diagnosis Date  . Anxiety   . Asthma   . Depression   . Hypertension     Past Surgical History:  Procedure Laterality Date  . ABDOMINAL HYSTERECTOMY    . APPENDECTOMY    . CHOLECYSTECTOMY    . KNEE SURGERY Left   . SHOULDER SURGERY Right     There were no vitals filed for this visit.   Subjective Assessment - 06/19/17 1615    Subjective  Patient reports L knee exacerbation of 1 month duration; pain is usually in L medial knee. Unsure of an event that could have caused it. Had L knee aspiration and cortisone injection 03/19/17 but pain relief did not last long.  Reports most pain when going up/down stairs; has some stairs at home and at work so this is a problem for her. Has a hx of L knee pain of 30 years duration after she had a fall with subsequent arthroscopy.  Received knee brace at most recent appointment with MD but it will not stay on.     Pertinent History  asthma, HTN, L knee arthroscopy, R RTC repair    Limitations  Sitting;Walking;Standing;House hold activities    How long can you sit comfortably?  1 hour    How long can you stand comfortably?  30 min    How long can you walk comfortably?  30 min    Diagnostic tests  06/10/17 L knee xray: No  dislocation subluxation.  Medial compartment with moderate to severe medial compartmental narrowing.  Lateral compartment with mild to moderate narrowing and osteophytes.  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    Patient Stated Goals  want to limp down the aisle of granddaughter's wedding, go up steps without grunting, be able to walk 7500 steps a day    Currently in Pain?  No/denies    Pain Score  0-No pain    Pain Location  Knee    Pain Descriptors / Indicators  Sharp    Pain Type  Chronic pain    Pain Onset  1 to 4 weeks ago    Aggravating Factors   climbing stairs, prolonged walking/standing/sitting    Pain Relieving Factors  ice, menthol creams         [facility] PT Assessment - 06/19/17 1630      Assessment   Medical Diagnosis  Primary osteoarthritis of L knee    Referring Provider  Richardean Canal, MD    Onset Date/Surgical Date  05/19/17    Next MD Visit  06/27/17    Prior Therapy  Yes- for RTC repair      Precautions  Precautions  None      Restrictions   Weight Bearing Restrictions  No      Balance Screen   Has the patient fallen in the past 6 months  No    Has the patient had a decrease in activity level because of a fear of falling?   Yes    Is the patient reluctant to leave their home because of a fear of falling?   No      Home Public house manager residence    Living Arrangements  Spouse/significant other    Available Help at Discharge  Family    Type of Home  House    Home Access  Level entry also 7 steps with 1 hand rail to enter at other entrance    Home Layout  One level    Home Equipment  None      Prior Function   Level of Independence  Independent    Vocation  Part time employment    Vocation Requirements  sitting at office    Leisure  workout at [employer]   Overall Cognitive Status  Within Functional Limits for tasks assessed      Observation/Other Assessments   Focus on Therapeutic  Outcomes (FOTO)   Knee: 51(49% limited, 41% predicted)      Sensation   Light Touch  Appears Intact      Coordination   Gross Motor Movements are Fluid and Coordinated  Yes      Posture/Postural Control   Posture/Postural Control  Postural limitations    Postural Limitations  Rounded Shoulders;Forward head      ROM / Strength   AROM / PROM / Strength  AROM;PROM;Strength      AROM   AROM Assessment Site  Knee    Right/Left Knee  Right;Left    Right Knee Extension  2    Right Knee Flexion  125    Left Knee Extension  0    Left Knee Flexion  114      PROM   PROM Assessment Site  Knee    Right/Left Knee  Left;Right    Right Knee Extension  3    Right Knee Flexion  130    Left Knee Extension  2    Left Knee Flexion  126      Strength   Strength Assessment Site  Hip;Knee;Ankle    Right/Left Hip  Right;Left    Right Hip Flexion  4/5    Right Hip ABduction  4/5    Right Hip ADduction  4/5    Left Hip Flexion  4/5    Left Hip ABduction  4+/5    Left Hip ADduction  4+/5    Right/Left Knee  Right;Left    Right Knee Flexion  4/5    Right Knee Extension  4/5    Left Knee Flexion  4/5 pain    Left Knee Extension  4/5 pain    Right/Left Ankle  Right;Left    Right Ankle Dorsiflexion  4+/5    Right Ankle Plantar Flexion  4+/5    Left Ankle Dorsiflexion  4/5 pain in L knee    Left Ankle Plantar Flexion  4+/5 pain in L knee      Palpation   Palpation comment  hypomobility in L patella in all directions; TTP in distal ITB insertion      Ambulation/Gait   Ambulation/Gait  Yes    Ambulation/Gait  Assistance  7: Independent    Assistive device  None    Gait Pattern  Step-through pattern;Lateral trunk lean to right    Ambulation Surface  Level;Indoor    Gait velocity  slightly decreased                Objective measurements completed on examination: See above findings.              PT Education - 06/19/17 1754    Education Details  prognosis, POC, HEP     Person(s) Educated  Patient    Methods  Explanation;Demonstration;Tactile cues;Verbal cues;Handout    Comprehension  Returned demonstration;Verbalized understanding       PT Short Term Goals - 06/19/17 1800      PT SHORT TERM GOAL #1   Title  Patient to be independent with initial HEP.    Time  3    Period  Weeks    Status  New    Target Date  07/10/17        PT Long Term Goals - 06/19/17 1800      PT LONG TERM GOAL #1   Title  Patient to be independent with advanced HEP.    Time  6    Period  Weeks    Status  New    Target Date  07/31/17      PT LONG TERM GOAL #2   Title  Patient to demonstrate L knee AROM/PROM symmetrical to opposite LE without pain limiting.     Time  6    Period  Weeks    Status  New    Target Date  07/31/17      PT LONG TERM GOAL #3   Title  Patient to demonstrate B LE strength >=4+/5.    Time  6    Period  Weeks    Status  New    Target Date  07/31/17      PT LONG TERM GOAL #4   Title  Patient to report tolerance of 1.5 hours of walking without pain.    Time  6    Period  Weeks    Status  New    Target Date  07/31/17      PT LONG TERM GOAL #5   Title  Patient to demonstrate reciprocal stair climbing up 13 steps with 1 handrail with good eccentric control and <1/10 pain.    Time  6    Period  Weeks    Status  New    Target Date  07/31/17             Plan - 06/19/17 1755    Clinical Impression Statement  Patient is a 71y/o F presenting to OPPT with c/o L knee pain exacerbation of 1 month duration. Reports long standing hx of L knee pain with prior L knee arthroscopy, and recent L knee aspiration and cortisone injection on 03/19/17 with only temporary pain relief. Reports pain is in medial knee and worse with prolonged walking/standing/sitting and especially stair climbing. Today patient with decreased B hip strength and decreased and painful L knee flexion. Patient also with decreased patellar mobility on L and TTP in ITB insertion.  Educated patient on stretching and hip strengthening HEP; patient reported understanding. Will benefit from skilled PT services 2x/week for 6 weeks to address impairments and return to recreational activities.     Clinical Presentation  Stable    Clinical Decision Making  Low    Rehab Potential  Good    PT Frequency  2x / week    PT Duration  6 weeks    PT Treatment/Interventions  ADLs/Self Care Home Management;Cryotherapy;Electrical Stimulation;Iontophoresis 4mg /ml Dexamethasone;Moist Heat;Ultrasound;Gait training;Stair training;Functional mobility training;Therapeutic activities;Therapeutic exercise;Manual techniques;Patient/family education;Neuromuscular re-education;Balance training;Passive range of motion;Dry needling;Energy conservation;Splinting;Vasopneumatic Device;Taping    PT Next Visit Plan  reassess HEP    Consulted and Agree with Plan of Care  Patient       Patient will benefit from skilled therapeutic intervention in order to improve the following deficits and impairments:  Hypomobility, Decreased activity tolerance, Decreased strength, Pain, Difficulty walking, Decreased mobility, Decreased balance, Decreased range of motion, Improper body mechanics, Postural dysfunction, Impaired flexibility  Visit Diagnosis: Chronic pain of left knee  Stiffness of left knee, not elsewhere classified  Muscle weakness (generalized)  Difficulty in walking, not elsewhere classified     Problem List Patient Active Problem List   Diagnosis Date Noted  . Asthma with acute exacerbation 11/28/2015     Anette GuarneriYevgeniya Elliannah Wayment, PT, DPT 06/19/17 6:04 PM   [facility] [place] [address]  [address] [place], [place], [address] Phone: [phone]   Fax:  [phone]  Name: Crystal CrouchRebecca Jackson MRN: [id number] Date of Birth: 02-Aug-1945

## 2017-06-25 ENCOUNTER — Encounter: Payer: Self-pay | Admitting: Physical Therapy

## 2017-06-25 ENCOUNTER — Ambulatory Visit: Payer: Medicare Other | Admitting: Physical Therapy

## 2017-06-25 DIAGNOSIS — M25662 Stiffness of left knee, not elsewhere classified: Secondary | ICD-10-CM

## 2017-06-25 DIAGNOSIS — M6281 Muscle weakness (generalized): Secondary | ICD-10-CM

## 2017-06-25 DIAGNOSIS — R262 Difficulty in walking, not elsewhere classified: Secondary | ICD-10-CM

## 2017-06-25 DIAGNOSIS — G8929 Other chronic pain: Secondary | ICD-10-CM

## 2017-06-25 DIAGNOSIS — M25562 Pain in left knee: Principal | ICD-10-CM

## 2017-06-25 NOTE — Therapy (Signed)
[facility] [address]  [address] [place], [place], [address] Phone: [phone]   Fax:  [phone]  Physical Therapy Treatment  Patient Details  Name: Crystal CrouchRebecca Jackson MRN: [id number] Date of Birth: 06/26/1945 Referring Provider: Richardean CanalGilbert Clark, MD   Encounter Date: 06/25/2017  PT End of Session - 06/25/17 0928    Visit Number  2    Number of Visits  13    Date for PT Re-Evaluation  07/31/17    Authorization Type  UHC Medicare    PT Start Time  0849    PT Stop Time  0936    PT Time Calculation (min)  47 min    Activity Tolerance  Patient tolerated treatment well    Behavior During Therapy  [facility] for tasks assessed/performed       Past Medical History:  Diagnosis Date  . Anxiety   . Asthma   . Depression   . Hypertension     Past Surgical History:  Procedure Laterality Date  . ABDOMINAL HYSTERECTOMY    . APPENDECTOMY    . CHOLECYSTECTOMY    . KNEE SURGERY Left   . SHOULDER SURGERY Right     There were no vitals filed for this visit.  Subjective Assessment - 06/25/17 0850    Subjective  Reports she has not been able to perform HEP consistently and when she did do them they were pretty difficult.    Pertinent History  asthma, HTN, L knee arthroscopy, R RTC repair    Diagnostic tests  06/10/17 L knee xray: No dislocation subluxation.  Medial compartment with moderate to severe medial compartmental narrowing.  Lateral compartment with mild to moderate narrowing and osteophytes.  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    Patient Stated Goals  want to limp down the aisle of granddaughter's wedding, go up steps without grunting, be able to walk 7500 steps a day    Currently in Pain?  Yes    Pain Score  4     Pain Location  Knee    Pain Orientation  Left    Pain Descriptors / Indicators  Aching                       [facility] Adult PT Treatment/Exercise - 06/25/17 0001       Exercises   Exercises  Knee/Hip      Knee/Hip Exercises: Stretches   ITB Stretch  Left;2 reps;20 seconds;Limitations    ITB Stretch Limitations  strap      Knee/Hip Exercises: Aerobic   Nustep  L2x6 min      Knee/Hip Exercises: Standing   Wall Squat  1 set;10 reps;Limitations      Knee/Hip Exercises: Supine   Bridges with Ball Squeeze  Strengthening;Both;1 set;10 reps;Limitations    Bridges with Clamshell  1 set;Both;10 reps;Limitations;Strengthening red TB around knees; VCs to maintain ER/ABD    Straight Leg Raises  Strengthening;Left;1 set;10 reps;Limitations    Straight Leg Raises Limitations  good form- no quad lag    Other Supine Knee/Hip Exercises  resisted hip flexion with red TB around ankles + B LEs on pball; 10x each      Knee/Hip Exercises: Sidelying   Clams  15x each side TCs to avoid trunk rotation       Modalities   Modalities  Vasopneumatic      Vasopneumatic   Number Minutes Vasopneumatic   10 minutes    Vasopnuematic Location  Knee    Vasopneumatic Pressure  Medium    Vasopneumatic Temperature   coldest      Manual Therapy   Manual Therapy  Joint mobilization;Soft tissue mobilization;Passive ROM    Joint Mobilization  patellar mobs in all directions grade III    Soft tissue mobilization  L quad, TFL, ITB insertion- TTP in these areas    Passive ROM  L hip flexor stretch 2x20 sec with PT OP               PT Short Term Goals - 06/19/17 1800      PT SHORT TERM GOAL #1   Title  Patient to be independent with initial HEP.    Time  3    Period  Weeks    Status  New    Target Date  07/10/17        PT Long Term Goals - 06/19/17 1800      PT LONG TERM GOAL #1   Title  Patient to be independent with advanced HEP.    Time  6    Period  Weeks    Status  New    Target Date  07/31/17      PT LONG TERM GOAL #2   Title  Patient to demonstrate L knee AROM/PROM symmetrical to opposite LE without pain limiting.     Time  6    Period  Weeks     Status  New    Target Date  07/31/17      PT LONG TERM GOAL #3   Title  Patient to demonstrate B LE strength >=4+/5.    Time  6    Period  Weeks    Status  New    Target Date  07/31/17      PT LONG TERM GOAL #4   Title  Patient to report tolerance of 1.5 hours of walking without pain.    Time  6    Period  Weeks    Status  New    Target Date  07/31/17      PT LONG TERM GOAL #5   Title  Patient to demonstrate reciprocal stair climbing up 13 steps with 1 handrail with good eccentric control and <1/10 pain.    Time  6    Period  Weeks    Status  New    Target Date  07/31/17            Plan - 06/25/17 0947    Clinical Impression Statement  Patient arrived to session with report that she has not been as compliant as she would like to have been with her HEP. Encouraged patient to find time in her schedule to perform HEP. Patient tolerated patellar mobs to L LE in all directions without c/o pain. Also tolerated STM to L quad, TFL, ITB insertion with TTP in these areas. Performed progressive hip strengthening ther-ex this session with banded resistance. Patient reported muscle fatigue, some report of L knee pain with knee extension during resisted hip flexion. VC/TCs required to correct form throughout. Patient with audible crepitus during wall squats in B knees, advised patient to avoid lowering down to point of pain and crepitus. Received [place] at end of session to L LE; normal integumentary response noted.     PT Treatment/Interventions  ADLs/Self Care Home Management;Cryotherapy;Electrical Stimulation;Iontophoresis 4mg /ml Dexamethasone;Moist Heat;Ultrasound;Gait training;Stair training;Functional mobility training;Therapeutic activities;Therapeutic exercise;Manual techniques;Patient/family education;Neuromuscular re-education;Balance training;Passive range of motion;Dry needling;Energy conservation;Splinting;Vasopneumatic Device;Taping    PT Next Visit  Plan  re-assess wall squats     Consulted and Agree with Plan of Care  Patient       Patient will benefit from skilled therapeutic intervention in order to improve the following deficits and impairments:  Hypomobility, Decreased activity tolerance, Decreased strength, Pain, Difficulty walking, Decreased mobility, Decreased balance, Decreased range of motion, Improper body mechanics, Postural dysfunction, Impaired flexibility  Visit Diagnosis: Chronic pain of left knee  Stiffness of left knee, not elsewhere classified  Muscle weakness (generalized)  Difficulty in walking, not elsewhere classified     Problem List Patient Active Problem List   Diagnosis Date Noted  . Asthma with acute exacerbation 11/28/2015     Crystal Jackson, PT, DPT 06/25/17 9:50 AM   [facility] [address]  [address] [place], [place], [address] Phone: [phone]   Fax:  [phone]  Name: Crystal Jackson MRN: [id number] Date of Birth: [birth year]

## 2017-06-27 ENCOUNTER — Encounter (INDEPENDENT_AMBULATORY_CARE_PROVIDER_SITE_OTHER): Payer: Self-pay | Admitting: Physician Assistant

## 2017-06-27 ENCOUNTER — Ambulatory Visit (INDEPENDENT_AMBULATORY_CARE_PROVIDER_SITE_OTHER): Payer: Medicare Other | Admitting: Physician Assistant

## 2017-06-27 DIAGNOSIS — M1712 Unilateral primary osteoarthritis, left knee: Secondary | ICD-10-CM

## 2017-06-27 NOTE — Progress Notes (Signed)
HPI: [name] is a 72 year old female who was given a cortisone injection in her left knee on 06/10/2017.  She has known osteoarthritis of the knee.  She continues to work with physical therapy feels this is been beneficial.  The diclofenac is been beneficial also.  Pain now is a 1-2 out of 10 pain at this worse.  She feels a cortisone injection on 06/10/2017 really help with the pain.    Review of systems:  No fevers chills shortness of breath chest pain.  Physical exam left knee she has full extension and flexion.  Moderate crepitus with passive range of motion of the knee.  She has tenderness along medial lateral joint line no effusion abnormal warmth erythema  Plan: She will continue to work with physical therapy and develop a home exercise program.  Did give her a handout on supplemental injection she wants to proceed with this we can do this sometime in the future she will just need to call to set this up.  Regards to the diclofenac and like to go down to once a daily 75 mg of diclofenac and see if this controls her pain.  She will follow with us on a as needed basis.  She understands that she can only have cortisone injections every 3 months.

## 2017-06-28 ENCOUNTER — Ambulatory Visit: Payer: Medicare Other

## 2017-06-28 DIAGNOSIS — M25562 Pain in left knee: Secondary | ICD-10-CM | POA: Diagnosis not present

## 2017-06-28 DIAGNOSIS — M25662 Stiffness of left knee, not elsewhere classified: Secondary | ICD-10-CM

## 2017-06-28 DIAGNOSIS — M6281 Muscle weakness (generalized): Secondary | ICD-10-CM

## 2017-06-28 DIAGNOSIS — R262 Difficulty in walking, not elsewhere classified: Secondary | ICD-10-CM

## 2017-06-28 DIAGNOSIS — G8929 Other chronic pain: Secondary | ICD-10-CM

## 2017-06-28 NOTE — Therapy (Signed)
[facility] [address]  [address] [place], [place], [address] Phone: [phone]   Fax:  [phone]  Physical Therapy Treatment  Patient Details  Name: Crystal Jackson MRN: [id number] Date of Birth: 01/07/1945 Referring Provider: Richardean Canal, MD   Encounter Date: 06/28/2017  PT End of Session - 06/28/17 0940    Visit Number  3    Number of Visits  13    Date for PT Re-Evaluation  07/31/17    Authorization Type  UHC Medicare    PT Start Time  0930    PT Stop Time  1010    PT Time Calculation (min)  40 min    Activity Tolerance  Patient tolerated treatment well    Behavior During Therapy  [facility] for tasks assessed/performed       Past Medical History:  Diagnosis Date  . Anxiety   . Asthma   . Depression   . Hypertension     Past Surgical History:  Procedure Laterality Date  . ABDOMINAL HYSTERECTOMY    . APPENDECTOMY    . CHOLECYSTECTOMY    . KNEE SURGERY Left   . SHOULDER SURGERY Right     There were no vitals filed for this visit.  Subjective Assessment - 06/28/17 0942    Subjective  Doing well today.  Notes HEP is going well most days.      Pertinent History  asthma, HTN, L knee arthroscopy, R RTC repair    Diagnostic tests  06/10/17 L knee xray: No dislocation subluxation.  Medial compartment with moderate to severe medial compartmental narrowing.  Lateral compartment with mild to moderate narrowing and osteophytes.  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    Patient Stated Goals  want to limp down the aisle of granddaughter's wedding, go up steps without grunting, be able to walk 7500 steps a day    Currently in Pain?  No/denies    Pain Score  0-No pain up to 7/10 at worst with stairs     Pain Location  Knee    Pain Orientation  Left    Pain Descriptors / Indicators  Aching    Pain Type  Chronic pain    Aggravating Factors   climbing stairs, prolonged walking, Prolonged  standing    Multiple Pain Sites  No                       [facility] Adult PT Treatment/Exercise - 06/28/17 1005      Knee/Hip Exercises: Aerobic   Nustep  L3x6 min      Knee/Hip Exercises: Standing   Forward Step Up  Left;5 reps;Step Height: 6";Hand Hold: 2    Forward Step Up Limitations  terminated following 5 reps due to L knee pain and need for heavy 2 UE support from therapist     Wall Squat  1 set;10 reps;3 seconds    Wall Squat Limitations  with adduction ball squeeze Reduction in L knee pain with manual medial patellar glide     Other Standing Knee Exercises  L 4" step up with blue TB TKE, 2 ski poles support x 10 reps; improved comfort and control from 6" step up      Knee/Hip Exercises: Seated   Hamstring Curl  Left;10 reps    Hamstring Limitations  red TB      Knee/Hip Exercises: Supine   Bridges with Ball Squeeze  Strengthening;Both;1 set;10 reps;Limitations 3" hold with adduction ball squeeze  Straight Leg Raises  Strengthening;Left;1 set;10 reps;Limitations 2 sets     Straight Leg Raises Limitations  1#      Manual Therapy   Manual Therapy  Taping    Manual therapy comments  Seated    Kinesiotex  Create Space      Kinesiotix   Create Space  L knee chondromalacia taping pattern to encourage more medial tracking of patella and create space               PT Short Term Goals - 06/28/17 1030      PT SHORT TERM GOAL #1   Title  Patient to be independent with initial HEP.    Time  3    Period  Weeks    Status  On-going        PT Long Term Goals - 06/28/17 1030      PT LONG TERM GOAL #1   Title  Patient to be independent with advanced HEP.    Time  6    Period  Weeks    Status  On-going      PT LONG TERM GOAL #2   Title  Patient to demonstrate L knee AROM/PROM symmetrical to opposite LE without pain limiting.     Time  6    Period  Weeks    Status  On-going      PT LONG TERM GOAL #3   Title  Patient to demonstrate B LE strength  >=4+/5.    Time  6    Period  Weeks    Status  On-going      PT LONG TERM GOAL #4   Title  Patient to report tolerance of 1.5 hours of walking without pain.    Time  6    Period  Weeks    Status  On-going      PT LONG TERM GOAL #5   Title  Patient to demonstrate reciprocal stair climbing up 13 steps with 1 handrail with good eccentric control and <1/10 pain.    Time  6    Period  Weeks    Status  On-going            Plan - 06/28/17 0944    Clinical Impression Statement  Crystal Jackson doing well still noting most L knee pain with prolonged sitting, standing, and walking.  Tolerated all supine and standing LE strengthening therex well focused on quad/VMO strengthening.  Did have improved comfort with wall sit today with manual medial glide from therapist suggesting poor VMO activation/possible lateral patellar tracking.  Trial of chondromalacia K-taping applied to L knee to create space and encourage more medial patellar tracking for hopeful reduction in knee pain with functional tasks.  Will monitor response to this and continue to progress toward goals in coming visits.    PT Treatment/Interventions  ADLs/Self Care Home Management;Cryotherapy;Electrical Stimulation;Iontophoresis 4mg /ml Dexamethasone;Moist Heat;Ultrasound;Gait training;Stair training;Functional mobility training;Therapeutic activities;Therapeutic exercise;Manual techniques;Patient/family education;Neuromuscular re-education;Balance training;Passive range of motion;Dry needling;Energy conservation;Splinting;Vasopneumatic Device;Taping    Consulted and Agree with Plan of Care  Patient       Patient will benefit from skilled therapeutic intervention in order to improve the following deficits and impairments:  Hypomobility, Decreased activity tolerance, Decreased strength, Pain, Difficulty walking, Decreased mobility, Decreased balance, Decreased range of motion, Improper body mechanics, Postural dysfunction, Impaired  flexibility  Visit Diagnosis: Chronic pain of left knee  Stiffness of left knee, not elsewhere classified  Muscle weakness (generalized)  Difficulty in walking, not elsewhere classified  Problem List Patient Active Problem List   Diagnosis Date Noted  . Asthma with acute exacerbation 11/28/2015    Kermit BaloMicah Rianna Lukes, PTA 06/28/17 10:37 AM  [facility] [address]  [address] [place], [place], [address] Phone: [phone]   Fax:  [phone]  Name: Crystal Jackson MRN: [id number] Date of Birth: December 05, 1945

## 2017-07-01 ENCOUNTER — Encounter: Payer: Self-pay | Admitting: Physical Therapy

## 2017-07-01 ENCOUNTER — Ambulatory Visit: Payer: Medicare Other | Attending: Physician Assistant | Admitting: Physical Therapy

## 2017-07-01 DIAGNOSIS — R262 Difficulty in walking, not elsewhere classified: Secondary | ICD-10-CM | POA: Diagnosis present

## 2017-07-01 DIAGNOSIS — M25562 Pain in left knee: Secondary | ICD-10-CM | POA: Diagnosis present

## 2017-07-01 DIAGNOSIS — M25662 Stiffness of left knee, not elsewhere classified: Secondary | ICD-10-CM

## 2017-07-01 DIAGNOSIS — M6281 Muscle weakness (generalized): Secondary | ICD-10-CM | POA: Insufficient documentation

## 2017-07-01 DIAGNOSIS — G8929 Other chronic pain: Secondary | ICD-10-CM | POA: Diagnosis present

## 2017-07-01 NOTE — Therapy (Signed)
[facility] [address]  [address] [place], [place], [address] Phone: [phone]   Fax:  [phone]  Physical Therapy Treatment  Patient Details  Name: Crystal Jackson MRN: [id number] Date of Birth: September 23, 1945 Referring Provider: Richardean Canal, MD   Encounter Date: 07/01/2017  PT End of Session - 07/01/17 1807    Visit Number  4    Number of Visits  13    Date for PT Re-Evaluation  07/31/17    Authorization Type  UHC Medicare    PT Start Time  1612    PT Stop Time  1702    PT Time Calculation (min)  50 min    Equipment Utilized During Treatment  Gait belt    Activity Tolerance  Patient tolerated treatment well;Patient limited by pain    Behavior During Therapy  [facility] for tasks assessed/performed       Past Medical History:  Diagnosis Date  . Anxiety   . Asthma   . Depression   . Hypertension     Past Surgical History:  Procedure Laterality Date  . ABDOMINAL HYSTERECTOMY    . APPENDECTOMY    . CHOLECYSTECTOMY    . KNEE SURGERY Left   . SHOULDER SURGERY Right     There were no vitals filed for this visit.  Subjective Assessment - 07/01/17 1614    Subjective  Patient reports her muscles are sore from doing her HEP. Reports she had a busy day today and was on her feet a lot.     Pertinent History  asthma, HTN, L knee arthroscopy, R RTC repair    Diagnostic tests  06/10/17 L knee xray: No dislocation subluxation.  Medial compartment with moderate to severe medial compartmental narrowing.  Lateral compartment with mild to moderate narrowing and osteophytes.  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    Patient Stated Goals  want to limp down the aisle of granddaughter's wedding, go up steps without grunting, be able to walk 7500 steps a day    Currently in Pain?  Yes    Pain Score  3     Pain Location  Knee    Pain Orientation  Left    Pain Descriptors / Indicators  Aching    Pain Type   Chronic pain                       [facility] Adult PT Treatment/Exercise - 07/01/17 0001      Transfers   Transfers  Floor to Transfer getting up from floor    Floor to Transfer  1: +2 Total assist;With upper extremity assist    Transfer Cueing  VC/TCs required for hand and foot placement    Comments  Patient with difficulty getting up from floor mat after VC/TCs for technique and foot/hand placement; transfered with max A x 2 to stand      Knee/Hip Exercises: Stretches   ITB Stretch  Left;2 reps;20 seconds;Limitations    ITB Stretch Limitations  strap      Knee/Hip Exercises: Aerobic   Nustep  L3x6 min      Knee/Hip Exercises: Seated   Long Arc Quad  Both;1 set;20 reps;Weights    Long Arc Quad Weight  -- 0# L LE, 3# R LE    Sit to Sand  1 set;10 reps;with UE support sitting on 1 airex pad      Knee/Hip Exercises: Supine   Bridges with [employer]  Strengthening;Both;1  set;Limitations;15 reps    Bridges with Clamshell  1 set;Both;Limitations;Strengthening;15 reps red TB around kness    Straight Leg Raises  Left;1 set;10 reps;Limitations    Straight Leg Raises Limitations  2#      Manual Therapy   Soft tissue mobilization  L quad, TFL, ITB insertion- TTP in these areas    Passive ROM  L hip flexor stretch 2x20 sec with PT OP             PT Education - 07/01/17 1807    Education Details  Addition to HEP, 2 handouts on getting up from floor after a fall    Person(s) Educated  Patient    Methods  Explanation;Demonstration;Tactile cues;Verbal cues;Handout    Comprehension  Returned demonstration;Verbalized understanding       PT Short Term Goals - 07/01/17 1815      PT SHORT TERM GOAL #1   Title  Patient to be independent with initial HEP.    Time  3    Period  Weeks    Status  Achieved        PT Long Term Goals - 07/01/17 1814      PT LONG TERM GOAL #1   Title  Patient to be independent with advanced HEP.    Time  6    Period  Weeks    Status   On-going      PT LONG TERM GOAL #2   Title  Patient to demonstrate L knee AROM/PROM symmetrical to opposite LE without pain limiting.     Time  6    Period  Weeks    Status  On-going      PT LONG TERM GOAL #3   Title  Patient to demonstrate B LE strength >=4+/5.    Time  6    Period  Weeks    Status  On-going      PT LONG TERM GOAL #4   Title  Patient to report tolerance of 1.5 hours of walking without pain.    Time  6    Period  Weeks    Status  On-going      PT LONG TERM GOAL #5   Title  Patient to demonstrate reciprocal stair climbing up 13 steps with 1 handrail with good eccentric control and <1/10 pain.    Time  6    Period  Weeks    Status  On-going      Additional Long Term Goals   Additional Long Term Goals  Yes      PT LONG TERM GOAL #6   Title  Patient to be able to perform floor transfer to sitting or standing with mod A.     Time  6    Period  Weeks    Status  New    Target Date  07/31/17            Plan - 07/01/17 1808    Clinical Impression Statement  Patient arrived to session with report that she had increased soreness in glutes after performing HEP. Reports that she has troubling getting up off the floor at home and would like to practice this at the clinic. Patient tolerated mat ther-ex for hip strengthening and stretching with good effort and no c/o pain. Attempted to practice floor transfers on floor mat- patient educated on technique and hand/foot placement in order to push up off treatment table to stand up. Unable to stand up with max A x 1. Required max  A x 2 to stand up. Patient given handout on 2 different techniques to complete this functional activity. Assured the patient that we would make this a goal as part of her POC. Patient performed LAQ for quad strengthening with some report of crepitus in L knee- given SAQ and SLR as addition to help. Patient left session with report of "I feel like I really got a workout and I think I need a soda."     PT Treatment/Interventions  ADLs/Self Care Home Management;Cryotherapy;Electrical Stimulation;Iontophoresis 4mg /ml Dexamethasone;Moist Heat;Ultrasound;Gait training;Stair training;Functional mobility training;Therapeutic activities;Therapeutic exercise;Manual techniques;Patient/family education;Neuromuscular re-education;Balance training;Passive range of motion;Dry needling;Energy conservation;Splinting;Vasopneumatic Device;Taping    PT Next Visit Plan  reassess addition to HEP    Consulted and Agree with Plan of Care  Patient       Patient will benefit from skilled therapeutic intervention in order to improve the following deficits and impairments:  Hypomobility, Decreased activity tolerance, Decreased strength, Pain, Difficulty walking, Decreased mobility, Decreased balance, Decreased range of motion, Improper body mechanics, Postural dysfunction, Impaired flexibility  Visit Diagnosis: Chronic pain of left knee  Stiffness of left knee, not elsewhere classified  Muscle weakness (generalized)  Difficulty in walking, not elsewhere classified     Problem List Patient Active Problem List   Diagnosis Date Noted  . Asthma with acute exacerbation 11/28/2015    Anette Guarneri, PT, DPT 07/01/17 6:18 PM  [facility] [address]  [address] [place], [place], [address] Phone: [phone]   Fax:  [phone]  Name: Elpidia Karn MRN: [id number] Date of Birth: 1945/03/18

## 2017-07-03 ENCOUNTER — Ambulatory Visit: Payer: Medicare Other

## 2017-07-03 DIAGNOSIS — M25662 Stiffness of left knee, not elsewhere classified: Secondary | ICD-10-CM

## 2017-07-03 DIAGNOSIS — M25562 Pain in left knee: Principal | ICD-10-CM

## 2017-07-03 DIAGNOSIS — M6281 Muscle weakness (generalized): Secondary | ICD-10-CM

## 2017-07-03 DIAGNOSIS — R262 Difficulty in walking, not elsewhere classified: Secondary | ICD-10-CM

## 2017-07-03 DIAGNOSIS — G8929 Other chronic pain: Secondary | ICD-10-CM

## 2017-07-03 NOTE — Therapy (Signed)
[facility] [address]  [address] [place], [place], [address] Phone: [phone]   Fax:  [phone]  Physical Therapy Treatment  Patient Details  Name: Crystal Jackson MRN: [id number] Date of Birth: August 25, 1945 Referring Provider: Richardean Canal, MD   Encounter Date: 07/03/2017  PT End of Session - 07/03/17 1626    Visit Number  5    Number of Visits  13    Date for PT Re-Evaluation  07/31/17    Authorization Type  UHC Medicare    PT Start Time  1619    PT Stop Time  1700    PT Time Calculation (min)  41 min    Equipment Utilized During Treatment  --    Activity Tolerance  Patient tolerated treatment well;Patient limited by pain    Behavior During Therapy  [facility] for tasks assessed/performed       Past Medical History:  Diagnosis Date  . Anxiety   . Asthma   . Depression   . Hypertension     Past Surgical History:  Procedure Laterality Date  . ABDOMINAL HYSTERECTOMY    . APPENDECTOMY    . CHOLECYSTECTOMY    . KNEE SURGERY Left   . SHOULDER SURGERY Right     There were no vitals filed for this visit.  Subjective Assessment - 07/03/17 1623    Subjective  Pt. reporting she has had sore muscles last few days which she attributes to HEP.      Pertinent History  asthma, HTN, L knee arthroscopy, R RTC repair    Diagnostic tests  06/10/17 L knee xray: No dislocation subluxation.  Medial compartment with moderate to severe medial compartmental narrowing.  Lateral compartment with mild to moderate narrowing and osteophytes.  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    Patient Stated Goals  want to limp down the aisle of granddaughter's wedding, go up steps without grunting, be able to walk 7500 steps a day    Currently in Pain?  Yes    Pain Score  2     Pain Location  Knee    Pain Orientation  Left    Pain Descriptors / Indicators  Aching    Pain Type  Chronic pain    Pain Onset  More than a  month ago    Multiple Pain Sites  No                       [facility] Adult PT Treatment/Exercise - 07/03/17 1622      Knee/Hip Exercises: Aerobic   Nustep  L4x6 min      Knee/Hip Exercises: Standing   Terminal Knee Extension  Left;15 reps;Strengthening    Theraband Level (Terminal Knee Extension)  Level 4 (Blue)    Terminal Knee Extension Limitations  TKE with blue TB and minor cueing required       Knee/Hip Exercises: Seated   Long Arc Quad  1 set;Weights;Left;10 reps    [employer] Limitations  terminated due to L knee pain     Other Seated Knee/Hip Exercises  L fitter leg press (1 blue, 1 black band) x 10 reps     Hamstring Curl  15 reps    Hamstring Limitations  red TB    Sit to Sand  1 set;with UE support x 12 reps       Knee/Hip Exercises: Supine   Straight Leg Raises  Left;1 set;Limitations x 12 reps  Straight Leg Raises Limitations  2#      Manual Therapy   Manual Therapy  Passive ROM    Passive ROM  L manual ITB stretch x 30 sec                PT Short Term Goals - 07/01/17 1815      PT SHORT TERM GOAL #1   Title  Patient to be independent with initial HEP.    Time  3    Period  Weeks    Status  Achieved        PT Long Term Goals - 07/03/17 1712      PT LONG TERM GOAL #1   Title  Patient to be independent with advanced HEP.    Time  6    Period  Weeks    Status  On-going      PT LONG TERM GOAL #2   Title  Patient to demonstrate L knee AROM/PROM symmetrical to opposite LE without pain limiting.     Time  6    Period  Weeks    Status  On-going      PT LONG TERM GOAL #3   Title  Patient to demonstrate B LE strength >=4+/5.    Time  6    Period  Weeks    Status  On-going      PT LONG TERM GOAL #4   Title  Patient to report tolerance of 1.5 hours of walking without pain.    Time  6    Period  Weeks    Status  On-going      PT LONG TERM GOAL #5   Title  Patient to demonstrate reciprocal stair climbing up 13 steps with 1  handrail with good eccentric control and <1/10 pain.    Time  6    Period  Weeks    Status  On-going      PT LONG TERM GOAL #6   Title  Patient to be able to perform floor transfer to sitting or standing with mod A.     Time  6    Period  Weeks    Status  On-going            Plan - 07/03/17 1627    Clinical Impression Statement  Pt. with much difficulty and L knee pain performing open chain quad strengthening however improved tolerance for closed chain VMO/quad strengthening activities today.  Some manual stretching with therapist into ITB stretch today as to reduce possible lateral tracking of patella.  Tolerated addition of closed chain TKE with band and progression of sit<>stand activities well today.  Will monitor response and continue to progress as pt. tolerates in coming visits.    PT Treatment/Interventions  ADLs/Self Care Home Management;Cryotherapy;Electrical Stimulation;Iontophoresis 4mg /ml Dexamethasone;Moist Heat;Ultrasound;Gait training;Stair training;Functional mobility training;Therapeutic activities;Therapeutic exercise;Manual techniques;Patient/family education;Neuromuscular re-education;Balance training;Passive range of motion;Dry needling;Energy conservation;Splinting;Vasopneumatic Device;Taping    Consulted and Agree with Plan of Care  Patient       Patient will benefit from skilled therapeutic intervention in order to improve the following deficits and impairments:  Hypomobility, Decreased activity tolerance, Decreased strength, Pain, Difficulty walking, Decreased mobility, Decreased balance, Decreased range of motion, Improper body mechanics, Postural dysfunction, Impaired flexibility  Visit Diagnosis: Chronic pain of left knee  Stiffness of left knee, not elsewhere classified  Muscle weakness (generalized)  Difficulty in walking, not elsewhere classified     Problem List Patient Active Problem List   Diagnosis Date Noted  .  Asthma with acute  exacerbation 11/28/2015    Kermit Balo, PTA 07/03/17 5:12 PM  [facility] [address]  [address] [place], [place], [address] Phone: [phone]   Fax:  [phone]  Name: Crystal Jackson MRN: [id number] Date of Birth: [birth year]

## 2017-07-08 ENCOUNTER — Ambulatory Visit: Payer: Medicare Other

## 2017-07-09 ENCOUNTER — Encounter: Payer: Self-pay | Admitting: Physical Therapy

## 2017-07-09 ENCOUNTER — Ambulatory Visit: Payer: Medicare Other | Admitting: Physical Therapy

## 2017-07-09 DIAGNOSIS — G8929 Other chronic pain: Secondary | ICD-10-CM

## 2017-07-09 DIAGNOSIS — R262 Difficulty in walking, not elsewhere classified: Secondary | ICD-10-CM

## 2017-07-09 DIAGNOSIS — M6281 Muscle weakness (generalized): Secondary | ICD-10-CM

## 2017-07-09 DIAGNOSIS — M25662 Stiffness of left knee, not elsewhere classified: Secondary | ICD-10-CM

## 2017-07-09 DIAGNOSIS — M25562 Pain in left knee: Principal | ICD-10-CM

## 2017-07-09 NOTE — Therapy (Signed)
[facility] Outpatient Rehabilitation [facility] [address]  [address] [place], [place], [address] Phone: [phone]   Fax:  [phone]  Physical Therapy Treatment  Patient Details  Name: Crystal Jackson MRN: [id number] Date of Birth: [birth year] Referring Provider: Richardean Canal, MD   Encounter Date: 07/09/2017  PT End of Session - 07/09/17 0935    Visit Number  6    Number of Visits  13    Date for PT Re-Evaluation  07/31/17    Authorization Type  UHC Medicare    PT Start Time  0845    PT Stop Time  0931    PT Time Calculation (min)  46 min    Activity Tolerance  Patient tolerated treatment well;Patient limited by pain    Behavior During Therapy  [facility] for tasks assessed/performed       Past Medical History:  Diagnosis Date  . Anxiety   . Asthma   . Depression   . Hypertension     Past Surgical History:  Procedure Laterality Date  . ABDOMINAL HYSTERECTOMY    . APPENDECTOMY    . CHOLECYSTECTOMY    . KNEE SURGERY Left   . SHOULDER SURGERY Right     There were no vitals filed for this visit.  Subjective Assessment - 07/09/17 0847    Subjective  Patient reports that she is okay as long as she is doing exercises that don't require her to bend her knee.     Pertinent History  asthma, HTN, L knee arthroscopy, R RTC repair    Diagnostic tests  06/10/17 L knee xray: No dislocation subluxation.  Medial compartment with moderate to severe medial compartmental narrowing.  Lateral compartment with mild to moderate narrowing and osteophytes.  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    Patient Stated Goals  want to limp down the aisle of granddaughter's wedding, go up steps without grunting, be able to walk 7500 steps a day    Currently in Pain?  Yes    Pain Score  3     Pain Location  Knee    Pain Orientation  Left    Pain Descriptors / Indicators  Aching    Pain Type  Chronic pain                       [facility]  Adult PT Treatment/Exercise - 07/09/17 0001      Knee/Hip Exercises: Stretches   Passive Hamstring Stretch  Both;1 rep;30 seconds;Limitations    Passive Hamstring Stretch Limitations  supine strap    ITB Stretch  Both;1 rep;30 seconds;Limitations    ITB Stretch Limitations  supine strap      Knee/Hip Exercises: Aerobic   Nustep  L4x6 min      Knee/Hip Exercises: Standing   Terminal Knee Extension  Left;15 reps;Strengthening    Theraband Level (Terminal Knee Extension)  Level 3 (Green)    Terminal Knee Extension Limitations  chair for UE support    Hip Extension  Stengthening;Both;1 set;10 reps;Knee straight;Limitations    Extension Limitations  at counter top; VCs to keep chest upright    Functional Squat  1 set;5 reps;Limitations    Functional Squat Limitations  at counter top; mini squat to tolerance; heavy VC/TCs to keep chest upright and bottom back    Other Standing Knee Exercises  Sidestepping TB 2x61ft with red TB unable tot tolerated red TB to L      Knee/Hip Exercises: Supine   Short Arc  Quad Sets  Left;1 set;Limitations    Short Arc [employer] Limitations  Unable to tolerate L d/t pain and crepitus    Bridges  Strengthening;Both;1 set;10 reps;Limitations    Bridges Limitations  B LEs straight and on pball; cues to avoid valsalva    Straight Leg Raises  Left;1 set;Limitations;15 reps    Straight Leg Raises Limitations  2#; cues to decrease speed    Knee Flexion  Strengthening;Both;1 set;10 reps;Limitations    Knee Flexion Limitations  resisted hip flexion with red TB on pball      Knee/Hip Exercises: Sidelying   Hip ABduction  Strengthening;Both;1 set;10 reps;Limitations    Hip ABduction Limitations  TCs for form    Hip ADduction  Strengthening;Both;1 set;10 reps;Limitations    Hip ADduction Limitations  VCs for form             PT Education - 07/09/17 0935    Education Details  addition to HEP, administered green band    Person(s) Educated  Patient    Methods   Explanation;Demonstration;Tactile cues;Verbal cues;Handout    Comprehension  Returned demonstration;Verbalized understanding       PT Short Term Goals - 07/01/17 1815      PT SHORT TERM GOAL #1   Title  Patient to be independent with initial HEP.    Time  3    Period  Weeks    Status  Achieved        PT Long Term Goals - 07/03/17 1712      PT LONG TERM GOAL #1   Title  Patient to be independent with advanced HEP.    Time  6    Period  Weeks    Status  On-going      PT LONG TERM GOAL #2   Title  Patient to demonstrate L knee AROM/PROM symmetrical to opposite LE without pain limiting.     Time  6    Period  Weeks    Status  On-going      PT LONG TERM GOAL #3   Title  Patient to demonstrate B LE strength >=4+/5.    Time  6    Period  Weeks    Status  On-going      PT LONG TERM GOAL #4   Title  Patient to report tolerance of 1.5 hours of walking without pain.    Time  6    Period  Weeks    Status  On-going      PT LONG TERM GOAL #5   Title  Patient to demonstrate reciprocal stair climbing up 13 steps with 1 handrail with good eccentric control and <1/10 pain.    Time  6    Period  Weeks    Status  On-going      PT LONG TERM GOAL #6   Title  Patient to be able to perform floor transfer to sitting or standing with mod A.     Time  6    Period  Weeks    Status  On-going            Plan - 07/09/17 1610    Clinical Impression Statement  Patient arrived with no new complaints. Reports exercises that require L knee flexion are painful, otherwise okay. Patient tolerated progressive hip strengthening ther-ex this session with VC/TCs required to correct form, but showed good effort. No c/o pain with hip strengthening. Report of L knee pain when attempting SAQ- discontinued. Patient able to perform TKE  L knee pain-free; added this exercise to HEP and administered handout. Advised to perform holding onto chair for support. Patient reported understanding. Attempted  sidestepping with red TB- patient with short step length on R, unable to tolerate on L d/t compensations. Opted without band resistance on L. Also with difficulty performing mini squats at counter top d/t patient's report of "the knees don't want to bend." Completed session with no c/o pain.     PT Treatment/Interventions  ADLs/Self Care Home Management;Cryotherapy;Electrical Stimulation;Iontophoresis 4mg /ml Dexamethasone;Moist Heat;Ultrasound;Gait training;Stair training;Functional mobility training;Therapeutic activities;Therapeutic exercise;Manual techniques;Patient/family education;Neuromuscular re-education;Balance training;Passive range of motion;Dry needling;Energy conservation;Splinting;Vasopneumatic Device;Taping    PT Next Visit Plan  reassess sidestepping TB    Consulted and Agree with Plan of Care  Patient       Patient will benefit from skilled therapeutic intervention in order to improve the following deficits and impairments:  Hypomobility, Decreased activity tolerance, Decreased strength, Pain, Difficulty walking, Decreased mobility, Decreased balance, Decreased range of motion, Improper body mechanics, Postural dysfunction, Impaired flexibility  Visit Diagnosis: Chronic pain of left knee  Stiffness of left knee, not elsewhere classified  Muscle weakness (generalized)  Difficulty in walking, not elsewhere classified     Problem List Patient Active Problem List   Diagnosis Date Noted  . Asthma with acute exacerbation 11/28/2015    Anette GuarneriYevgeniya Lashia Niese, PT, DPT 07/09/17 9:38 AM   [facility] [place] [facility] [address]  [address] [place], [place], [address] Phone: [phone]   Fax:  [phone]  Name: Crystal Jackson MRN: [id number] Date of Birth: February 15, 1945

## 2017-07-11 ENCOUNTER — Ambulatory Visit: Payer: Medicare Other | Admitting: Physical Therapy

## 2017-07-11 ENCOUNTER — Encounter: Payer: Self-pay | Admitting: Physical Therapy

## 2017-07-11 DIAGNOSIS — M25562 Pain in left knee: Principal | ICD-10-CM

## 2017-07-11 DIAGNOSIS — M6281 Muscle weakness (generalized): Secondary | ICD-10-CM

## 2017-07-11 DIAGNOSIS — M25662 Stiffness of left knee, not elsewhere classified: Secondary | ICD-10-CM

## 2017-07-11 DIAGNOSIS — G8929 Other chronic pain: Secondary | ICD-10-CM

## 2017-07-11 DIAGNOSIS — R262 Difficulty in walking, not elsewhere classified: Secondary | ICD-10-CM

## 2017-07-11 NOTE — Therapy (Signed)
[facility] Outpatient Rehabilitation [facility] [address]  [address] [place], [place], [address] Phone: [phone]   Fax:  [phone]  Physical Therapy Treatment  Patient Details  Name: Crystal Jackson MRN: [id number] Date of Birth: [birth year] Referring Provider: Richardean Canal, MD   Encounter Date: 07/11/2017  PT End of Session - 07/11/17 1657    Visit Number  7    Number of Visits  13    Date for PT Re-Evaluation  07/31/17    Authorization Type  UHC Medicare    PT Start Time  1615    PT Stop Time  1655    PT Time Calculation (min)  40 min    Activity Tolerance  Patient tolerated treatment well    Behavior During Therapy  [facility] for tasks assessed/performed       Past Medical History:  Diagnosis Date  . Anxiety   . Asthma   . Depression   . Hypertension     Past Surgical History:  Procedure Laterality Date  . ABDOMINAL HYSTERECTOMY    . APPENDECTOMY    . CHOLECYSTECTOMY    . KNEE SURGERY Left   . SHOULDER SURGERY Right     There were no vitals filed for this visit.  Subjective Assessment - 07/11/17 1616    Subjective  Reports she went to work yesterday and everyone said she looked better than Monday. Reports she is perking up today.     Pertinent History  asthma, HTN, L knee arthroscopy, R RTC repair    Diagnostic tests  06/10/17 L knee xray: No dislocation subluxation.  Medial compartment with moderate to severe medial compartmental narrowing.  Lateral compartment with mild to moderate narrowing and osteophytes.  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    Patient Stated Goals  want to limp down the aisle of granddaughter's wedding, go up steps without grunting, be able to walk 7500 steps a day    Currently in Pain?  Yes    Pain Score  3     Pain Location  Knee    Pain Orientation  Left    Pain Descriptors / Indicators  Aching    Pain Type  Chronic pain                       [facility] Adult PT  Treatment/Exercise - 07/11/17 0001      Knee/Hip Exercises: Stretches   Passive Hamstring Stretch  Left;2 reps;20 seconds;Limitations    Passive Hamstring Stretch Limitations  supine strap    ITB Stretch  Limitations;Left;2 reps;20 seconds    ITB Stretch Limitations  supine strap      Knee/Hip Exercises: Aerobic   Nustep  L3x6 min      Knee/Hip Exercises: Standing   Terminal Knee Extension  Left;15 reps;Strengthening    Theraband Level (Terminal Knee Extension)  Level 3 (Green)    Terminal Knee Extension Limitations  chair for UE support    Wall Squat  1 set;10 reps;Limitations    Wall Squat Limitations  limited ranged; red TB around knees    Other Standing Knee Exercises  Sidestepping without TB 2x37ft VCs to bring feet forward and upright trunk      Knee/Hip Exercises: Seated   Hamstring Curl  15 reps;Left;Strengthening;1 set    Hamstring Limitations  green TB      Knee/Hip Exercises: Supine   Quad Sets  Left;1 set;10 reps;Limitations    Quad Sets Limitations  10x5" with L 1/2  bolster under knee    Bridges with [employer]  1 set;Both;Limitations;Strengthening;15 reps red TB around knees    Straight Leg Raises  Left;1 set;Limitations;15 reps    Straight Leg Raises Limitations  3#    Knee Flexion  Strengthening;Both;1 set;Limitations;20 reps    Knee Flexion Limitations  resisted hip flexion with red TB on pball      Knee/Hip Exercises: Sidelying   Hip ADduction  Strengthening;Both;1 set;10 reps;Limitations    Hip ADduction Limitations  VCs for form    Clams  10x each side with red TB around knees    Other Sidelying Knee/Hip Exercises  side plank lifts; attempted ~6 with c/o crepitus in knee               PT Short Term Goals - 07/01/17 1815      PT SHORT TERM GOAL #1   Title  Patient to be independent with initial HEP.    Time  3    Period  Weeks    Status  Achieved        PT Long Term Goals - 07/03/17 1712      PT LONG TERM GOAL #1   Title  Patient to be  independent with advanced HEP.    Time  6    Period  Weeks    Status  On-going      PT LONG TERM GOAL #2   Title  Patient to demonstrate L knee AROM/PROM symmetrical to opposite LE without pain limiting.     Time  6    Period  Weeks    Status  On-going      PT LONG TERM GOAL #3   Title  Patient to demonstrate B LE strength >=4+/5.    Time  6    Period  Weeks    Status  On-going      PT LONG TERM GOAL #4   Title  Patient to report tolerance of 1.5 hours of walking without pain.    Time  6    Period  Weeks    Status  On-going      PT LONG TERM GOAL #5   Title  Patient to demonstrate reciprocal stair climbing up 13 steps with 1 handrail with good eccentric control and <1/10 pain.    Time  6    Period  Weeks    Status  On-going      PT LONG TERM GOAL #6   Title  Patient to be able to perform floor transfer to sitting or standing with mod A.     Time  6    Period  Weeks    Status  On-going            Plan - 07/11/17 1658    Clinical Impression Statement  Patient arrived to session with no new complaints. Able to perform hip and knee strengthening ther-ex on mat without c/o pain. Patient also demonstrated good form throughout. Able to tolerate increased weight on L SLR with good form. Avoided SAQ and LAQ -type exercises this session as patient reports these increase pain and crepitus. Able to perform sidestepping without TB and TKE with good form. Also able to perform wall squats in limited range with TB around knees- patient with mild discomfort but no pain. Completed session with report of muscle fatigue.     PT Treatment/Interventions  ADLs/Self Care Home Management;Cryotherapy;Electrical Stimulation;Iontophoresis 4mg /ml Dexamethasone;Moist Heat;Ultrasound;Gait training;Stair training;Functional mobility training;Therapeutic activities;Therapeutic exercise;Manual techniques;Patient/family education;Neuromuscular re-education;Balance training;Passive range of motion;Dry  needling;Energy conservation;Splinting;Vasopneumatic Device;Taping    Consulted and Agree with Plan of Care  Patient       Patient will benefit from skilled therapeutic intervention in order to improve the following deficits and impairments:  Hypomobility, Decreased activity tolerance, Decreased strength, Pain, Difficulty walking, Decreased mobility, Decreased balance, Decreased range of motion, Improper body mechanics, Postural dysfunction, Impaired flexibility  Visit Diagnosis: Chronic pain of left knee  Stiffness of left knee, not elsewhere classified  Muscle weakness (generalized)  Difficulty in walking, not elsewhere classified     Problem List Patient Active Problem List   Diagnosis Date Noted  . Asthma with acute exacerbation 11/28/2015    Anette Guarneri, PT, DPT 07/11/17 4:59 PM   [facility] [address]  [address] [place], [place], [address] Phone: [phone]   Fax:  [phone]  Name: Crystal Jackson MRN: [id number] Date of Birth: [birth year]

## 2017-07-15 ENCOUNTER — Ambulatory Visit: Payer: Medicare Other

## 2017-07-15 DIAGNOSIS — G8929 Other chronic pain: Secondary | ICD-10-CM

## 2017-07-15 DIAGNOSIS — R262 Difficulty in walking, not elsewhere classified: Secondary | ICD-10-CM

## 2017-07-15 DIAGNOSIS — M25662 Stiffness of left knee, not elsewhere classified: Secondary | ICD-10-CM

## 2017-07-15 DIAGNOSIS — M6281 Muscle weakness (generalized): Secondary | ICD-10-CM

## 2017-07-15 DIAGNOSIS — M25562 Pain in left knee: Secondary | ICD-10-CM | POA: Diagnosis not present

## 2017-07-15 NOTE — Therapy (Signed)
[facility] [address]  [address] [place], [place], [address] Phone: [phone]   Fax:  [phone]  Physical Therapy Treatment  Patient Details  Name: Crystal Jackson MRN: [id number] Date of Birth: December 24, 1945 Referring Provider: Richardean Canal, MD   Encounter Date: 07/15/2017  PT End of Session - 07/15/17 1622    Visit Number  8    Number of Visits  13    Date for PT Re-Evaluation  07/31/17    Authorization Type  [employer]HC Medicare    PT Start Time  1619    PT Stop Time  1700    PT Time Calculation (min)  41 min    Activity Tolerance  Patient tolerated treatment well    Behavior During Therapy  [facility] for tasks assessed/performed       Past Medical History:  Diagnosis Date  . Anxiety   . Asthma   . Depression   . Hypertension     Past Surgical History:  Procedure Laterality Date  . ABDOMINAL HYSTERECTOMY    . APPENDECTOMY    . CHOLECYSTECTOMY    . KNEE SURGERY Left   . SHOULDER SURGERY Right     There were no vitals filed for this visit.  Subjective Assessment - 07/15/17 1622    Subjective  Pt. doing well today.  Notes 50% improvement in L knee pain since starting therapy.      Pertinent History  asthma, HTN, L knee arthroscopy, R RTC repair    How long can you walk comfortably?  30 min     Diagnostic tests  06/10/17 L knee xray: No dislocation subluxation.  Medial compartment with moderate to severe medial compartmental narrowing.  Lateral compartment with mild to moderate narrowing and osteophytes.  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    Patient Stated Goals  want to limp down the aisle of granddaughter's wedding, go up steps without grunting, be able to walk 7500 steps a day    Currently in Pain?  Yes    Pain Score  2     Pain Location  Knee    Pain Orientation  Left    Pain Descriptors / Indicators  Aching    Pain Type  Chronic pain    Pain Onset  More than a month ago    Multiple Pain Sites  No                       [facility] Adult PT Treatment/Exercise - 07/15/17 1624      Knee/Hip Exercises: Aerobic   Nustep  L4 x 6 min      Knee/Hip Exercises: Machines for Strengthening   Cybex Knee Flexion  B LE's: 20# x 10 reps       Knee/Hip Exercises: Standing   Functional Squat  15 reps;3 seconds    Functional Squat Limitations  at counter top     Wall Squat  1 set;10 reps;Limitations    Wall Squat Limitations  5" hold; limited ROM due to L knee pain     Other Standing Knee Exercises  L TKE with adduction ball squeeze 5" x 15 reps       Knee/Hip Exercises: Seated   Long Arc Quad  Right;Left;15 reps;Strengthening    Long Arc Quad Limitations  adduction ball squeeze     Other Seated Knee/Hip Exercises  L fitter leg press (1 blue, 1 black band) x 15 reps     Hamstring Curl  20 reps;Strengthening;Left;1 set    Hamstring Limitations  green TB               PT Short Term Goals - 07/01/17 1815      PT SHORT TERM GOAL #1   Title  Patient to be independent with initial HEP.    Time  3    Period  Weeks    Status  Achieved        PT Long Term Goals - 07/03/17 1712      PT LONG TERM GOAL #1   Title  Patient to be independent with advanced HEP.    Time  6    Period  Weeks    Status  On-going      PT LONG TERM GOAL #2   Title  Patient to demonstrate L knee AROM/PROM symmetrical to opposite LE without pain limiting.     Time  6    Period  Weeks    Status  On-going      PT LONG TERM GOAL #3   Title  Patient to demonstrate B LE strength >=4+/5.    Time  6    Period  Weeks    Status  On-going      PT LONG TERM GOAL #4   Title  Patient to report tolerance of 1.5 hours of walking without pain.    Time  6    Period  Weeks    Status  On-going      PT LONG TERM GOAL #5   Title  Patient to demonstrate reciprocal stair climbing up 13 steps with 1 handrail with good eccentric control and <1/10 pain.    Time  6    Period  Weeks     Status  On-going      PT LONG TERM GOAL #6   Title  Patient to be able to perform floor transfer to sitting or standing with mod A.     Time  6    Period  Weeks    Status  On-going            Plan - 07/15/17 1623    Clinical Impression Statement  [name] reporting ~ 50% improvement in overall L knee pain since start therapy.  Tolerated progression of LE strengthening and TKE strengthening activities well today however still limited at times due to L knee pain with TKE activities.  Does require consistent cueing for pacing and tracking of repetitions with therex by therapist.  Will continue to progress toward goals.      PT Treatment/Interventions  ADLs/Self Care Home Management;Cryotherapy;Electrical Stimulation;Iontophoresis 4mg /ml Dexamethasone;Moist Heat;Ultrasound;Gait training;Stair training;Functional mobility training;Therapeutic activities;Therapeutic exercise;Manual techniques;Patient/family education;Neuromuscular re-education;Balance training;Passive range of motion;Dry needling;Energy conservation;Splinting;Vasopneumatic Device;Taping    Consulted and Agree with Plan of Care  Patient       Patient will benefit from skilled therapeutic intervention in order to improve the following deficits and impairments:  Hypomobility, Decreased activity tolerance, Decreased strength, Pain, Difficulty walking, Decreased mobility, Decreased balance, Decreased range of motion, Improper body mechanics, Postural dysfunction, Impaired flexibility  Visit Diagnosis: Chronic pain of left knee  Stiffness of left knee, not elsewhere classified  Muscle weakness (generalized)  Difficulty in walking, not elsewhere classified     Problem List Patient Active Problem List   Diagnosis Date Noted  . Asthma with acute exacerbation 11/28/2015    Kermit Balo, PTA 07/15/17 6:09 PM   [facility] [address]  [address] [place], [place],  [id number] Phone: [phone]   Fax:  [phone]  Name: Crystal CrouchRebecca Jackson MRN: [id number] Date of Birth: 04/16/1945

## 2017-07-17 ENCOUNTER — Ambulatory Visit: Payer: Medicare Other | Admitting: Physical Therapy

## 2017-07-17 ENCOUNTER — Encounter: Payer: Self-pay | Admitting: Physical Therapy

## 2017-07-17 DIAGNOSIS — G8929 Other chronic pain: Secondary | ICD-10-CM

## 2017-07-17 DIAGNOSIS — M25662 Stiffness of left knee, not elsewhere classified: Secondary | ICD-10-CM

## 2017-07-17 DIAGNOSIS — M25562 Pain in left knee: Principal | ICD-10-CM

## 2017-07-17 DIAGNOSIS — R262 Difficulty in walking, not elsewhere classified: Secondary | ICD-10-CM

## 2017-07-17 DIAGNOSIS — M6281 Muscle weakness (generalized): Secondary | ICD-10-CM

## 2017-07-17 NOTE — Therapy (Signed)
[facility] [address]  [place], [place], [address] Phone: [phone]   Fax:  [phone]  Physical Therapy Discharge   Patient Details  Name: Crystal Jackson MRN: [id number] Date of Birth: 20-Oct-1945 Referring Provider: Erskine Emery, MD   Progress Note Reporting Period 06/19/17 to 07/17/17  See note below for Objective Data and Assessment of Progress/Goals.    Encounter Date: 07/17/2017  PT End of Session - 07/17/17 1159    Visit Number  9    Number of Visits  13    Date for PT Re-Evaluation  07/31/17    Authorization Type  [employer]HC Medicare    PT Start Time  1100    PT Stop Time  1151    PT Time Calculation (min)  51 min    Equipment Utilized During Treatment  Gait belt    Activity Tolerance  Patient tolerated treatment well    Behavior During Therapy  WFL for tasks assessed/performed       Past Medical History:  Diagnosis Date  . Anxiety   . Asthma   . Depression   . Hypertension     Past Surgical History:  Procedure Laterality Date  . ABDOMINAL HYSTERECTOMY    . APPENDECTOMY    . CHOLECYSTECTOMY    . KNEE SURGERY Left   . SHOULDER SURGERY Right     There were no vitals filed for this visit.  Subjective Assessment - 07/17/17 1059    Subjective  Patient reports she added up her bills from her and her husband's bills and would like to discontinue PT at this time. Reports 50-60% improvement in L knee since initial eval. Reports improvement in walking tolerance, sit to stand from low bench less difficult. Still having difficulty with getting up off the floor.     Pertinent History  asthma, HTN, L knee arthroscopy, R RTC repair    Diagnostic tests  06/10/17 L knee xray: No dislocation subluxation.  Medial compartment with moderate to severe medial compartmental narrowing.  Lateral compartment with mild to moderate narrowing and osteophytes.  Patellofemoral compartment moderate patellofemoral arthritic  changes.    Patient Stated Goals  want to limp down the aisle of granddaughter's wedding, go up steps without grunting, be able to walk 7500 steps a day    Currently in Pain?  No/denies    Pain Score  0-No pain         [facility] PT Assessment - 07/17/17 0001      Observation/Other Assessments   Focus on Therapeutic Outcomes (FOTO)   Knee: 49 (51% limited, 41% predicted)      AROM   Left Knee Extension  0    Left Knee Flexion  129      PROM   Left Knee Extension  -2    Left Knee Flexion  134      Strength   Right/Left Hip  Right;Left    Right Hip Flexion  4+/5    Right Hip ABduction  4/5    Right Hip ADduction  4/5    Left Hip Flexion  4+/5    Left Hip ABduction  4/5    Left Hip ADduction  4/5    Right/Left Knee  Right;Left    Right Knee Flexion  4+/5    Right Knee Extension  4+/5    Left Knee Flexion  4+/5    Left Knee Extension  4+/5    Right/Left Ankle  Right;Left  Right Ankle Dorsiflexion  4+/5    Right Ankle Plantar Flexion  4+/5    Left Ankle Dorsiflexion  4+/5    Left Ankle Plantar Flexion  4+/5      Ambulation/Gait   Stairs  Yes    Stairs Assistance  6: Modified independent (Device/Increase time)    Stair Management Technique  One rail Left;One rail Right;Step to pattern    Number of Stairs  13    Height of Stairs  8    Gait Comments  step-to pattern with 1 handrail and anterior trunk lean throughout; subjective report of improvement in ability to climb stairs this date                   [facility] Adult PT Treatment/Exercise - 07/17/17 0001      Transfers   Transfers  Floor to Transfer    Floor to Transfer  3: Mod assist    Transfer Cueing  VC/TCs required for sequencing, foot placement, hand placement    Comments  large improvement in ability to transfer this date      Knee/Hip Exercises: Aerobic   Nustep  L3x6 min      Knee/Hip Exercises: Standing   Wall Squat  1 set;10 reps;Limitations    Wall Squat Limitations  with orange pball behind  back; to tolerance      Knee/Hip Exercises: Supine   Bridges with Clamshell  1 set;Both;Limitations;Strengthening;15 reps red TB around knees      Knee/Hip Exercises: Sidelying   Hip ABduction  Strengthening;Both;1 set;10 reps;Limitations    Hip ABduction Limitations  TCs for form    Hip ADduction  Strengthening;Both;1 set;10 reps;Limitations    Hip ADduction Limitations  VCs for form             PT Education - 07/17/17 1158    Education Details  updated HEP, edu on using gait belt for floor transfers, edu on using physioball for HEP    Person(s) Educated  Patient    Methods  Explanation;Demonstration;Tactile cues;Verbal cues;Handout    Comprehension  Returned demonstration;Verbalized understanding       PT Short Term Goals - 07/17/17 1109      PT SHORT TERM GOAL #1   Title  Patient to be independent with initial HEP.    Time  3    Period  Weeks    Status  Achieved        PT Long Term Goals - 07/17/17 1109      PT LONG TERM GOAL #1   Title  Patient to be independent with advanced HEP.    Time  6    Period  Weeks    Status  Achieved Reports consistency with HEP      PT LONG TERM GOAL #2   Title  Patient to demonstrate L knee AROM/PROM symmetrical to opposite LE without pain limiting.     Time  6    Period  Weeks    Status  Achieved much improved PROM and AROM this date      PT LONG TERM GOAL #3   Title  Patient to demonstrate B LE strength >=4+/5.    Time  6    Period  Weeks    Status  Partially Met much improved in B knee flex/ex, B hip flex, L DF strength      PT LONG TERM GOAL #4   Title  Patient to report tolerance of 1.5 hours of walking without pain.  Time  6    Period  Weeks    Status  Partially Met reports 48mn-1 hour without pain      PT LONG TERM GOAL #5   Title  Patient to demonstrate reciprocal stair climbing up 13 steps with 1 handrail with good eccentric control and <1/10 pain.    Time  6    Period  Weeks    Status  Not Met step-to  pattern with 1 handrail and anterior trunk lean throughout; subjective report of improvement in ability to climb stairs this date      PT LONG TERM GOAL #6   Title  Patient to be able to perform floor transfer to sitting or standing with mod A.     Time  6    Period  Weeks    Status  Achieved able to transfer with mod A this date            Plan - 07/17/17 1209    Clinical Impression Statement  Patient arrived to session with report of 50-60% improvement in L knee since initial eval. Reports improvement in in walking tolerance and sit to stand from low bench less difficult. Requests to discontinue PT d/t finances. Updated goals this date- patient has met strength, ROM, and floor transfer goals this date. Still limited in walking tolerance and stair climbing, however excellent improvement in function within these 9 visits. Patient able to perform floor transfer with mod A and VCs to instruct on foot and hand placement. Encouraged patient to use a gait belt at home in case of a fall, as she reports she has had instances where her or her husband has fallen and the other has not been able to get them off the floor. Patient reported understanding. Updated patient's HEP with current exercises and educated on use of pball for additional exercises as patient reports she will buy one for home use. Advised to perform standing exercises with chair for support. Patient reported understanding. Patient to be D/C'd at this time per patient's request.     PT Treatment/Interventions  ADLs/Self Care Home Management;Cryotherapy;Electrical Stimulation;Iontophoresis 476mml Dexamethasone;Moist Heat;Ultrasound;Gait training;Stair training;Functional mobility training;Therapeutic activities;Therapeutic exercise;Manual techniques;Patient/family education;Neuromuscular re-education;Balance training;Passive range of motion;Dry needling;Energy conservation;Splinting;Vasopneumatic Device;Taping    PT Next Visit Plan  D/C'd at  this time    Consulted and Agree with Plan of Care  Patient       Patient will benefit from skilled therapeutic intervention in order to improve the following deficits and impairments:  Hypomobility, Decreased activity tolerance, Decreased strength, Pain, Difficulty walking, Decreased mobility, Decreased balance, Decreased range of motion, Improper body mechanics, Postural dysfunction, Impaired flexibility  Visit Diagnosis: Chronic pain of left knee  Stiffness of left knee, not elsewhere classified  Muscle weakness (generalized)  Difficulty in walking, not elsewhere classified     Problem List Patient Active Problem List   Diagnosis Date Noted  . Asthma with acute exacerbation 11/28/2015    PHYSICAL THERAPY DISCHARGE SUMMARY  Visits from Start of Care: 9  Current functional level related to goals / functional outcomes: See above clinical summary   Remaining deficits: Decreased walking and stair tolerance, decreased hip strength   Education / Equipment: See above Plan: Patient agrees to discharge.  Patient goals were partially met. Patient is being discharged due to financial reasons.  ?????     YeJanene HarveyPT, DPT 07/17/17 12:14 PM    [place] [address] [place]  [place], [address] Phone: [phone]   Fax:  [phone]  Name: Juelle Dickmann MRN: [id number] Date of Birth: August 31, 1945

## 2017-07-18 ENCOUNTER — Ambulatory Visit: Payer: Medicare Other | Admitting: Physical Therapy

## 2017-07-22 ENCOUNTER — Ambulatory Visit: Payer: Medicare Other

## 2017-07-25 ENCOUNTER — Ambulatory Visit: Payer: Medicare Other | Admitting: Physical Therapy

## 2017-07-29 ENCOUNTER — Ambulatory Visit: Payer: Medicare Other

## 2017-08-01 ENCOUNTER — Ambulatory Visit: Payer: Medicare Other | Admitting: Physical Therapy

## 2017-08-05 ENCOUNTER — Ambulatory Visit: Payer: Medicare Other

## 2017-08-07 ENCOUNTER — Telehealth (INDEPENDENT_AMBULATORY_CARE_PROVIDER_SITE_OTHER): Payer: Self-pay | Admitting: Orthopaedic Surgery

## 2017-08-07 NOTE — Telephone Encounter (Signed)
See below please. 

## 2017-08-07 NOTE — Telephone Encounter (Signed)
Patient would like to proceed with gel injection of her left knee. [facility], patients # [phone]

## 2017-08-08 ENCOUNTER — Ambulatory Visit: Payer: Medicare Other | Admitting: Physical Therapy

## 2017-08-12 ENCOUNTER — Ambulatory Visit: Payer: Medicare Other

## 2017-08-13 NOTE — Telephone Encounter (Signed)
Noted  

## 2017-08-14 ENCOUNTER — Telehealth (INDEPENDENT_AMBULATORY_CARE_PROVIDER_SITE_OTHER): Payer: Self-pay

## 2017-08-14 NOTE — Telephone Encounter (Signed)
Patient approved for SynviscOne, left knee. Buy & Bill Covered at 80% Patient will be responsible for 20% OOP. No PA required.

## 2017-08-14 NOTE — Telephone Encounter (Signed)
Submitted for SynviscOne, left knee. 

## 2017-08-15 ENCOUNTER — Ambulatory Visit: Payer: Medicare Other | Admitting: Physical Therapy

## 2017-08-19 ENCOUNTER — Ambulatory Visit: Payer: Medicare Other

## 2017-08-22 ENCOUNTER — Ambulatory Visit: Payer: Medicare Other | Admitting: Physical Therapy

## 2017-09-05 ENCOUNTER — Encounter (INDEPENDENT_AMBULATORY_CARE_PROVIDER_SITE_OTHER): Payer: Self-pay | Admitting: Physician Assistant

## 2017-09-05 ENCOUNTER — Ambulatory Visit (INDEPENDENT_AMBULATORY_CARE_PROVIDER_SITE_OTHER): Payer: Medicare Other | Admitting: Physician Assistant

## 2017-09-05 DIAGNOSIS — M1712 Unilateral primary osteoarthritis, left knee: Secondary | ICD-10-CM | POA: Diagnosis not present

## 2017-09-05 MED ORDER — HYALURONAN 88 MG/4ML IX SOSY
88.0000 mg | PREFILLED_SYRINGE | INTRA_ARTICULAR | Status: AC | PRN
  Administered 2017-09-05: 88 mg via INTRA_ARTICULAR

## 2017-09-05 MED ORDER — LIDOCAINE HCL 1 % IJ SOLN
0.5000 mL | INTRAMUSCULAR | Status: AC | PRN
  Administered 2017-09-05: .5 mL

## 2017-09-05 NOTE — Progress Notes (Signed)
   Procedure Note  [name]             Date of Birth: September 18, 1945           MRN: [id number]             Visit Date: 09/05/2017 HPI: Crystal Jackson returns today for Synvisc 1 injection of her left knee.  She has known osteoarthritis of her left knee.  She underwent a cortisone injection of the knee on 06/10/2017 and this gave her good relief for about 6 weeks.  She did go to physical therapy and some of the exercises seem to aggravate the knee but she did get some benefit from this.  Physical exam: Left knee slight effusion.  No abnormal warmth erythema.  Slight extension lag.  Tenderness along medial joint line. Procedures: Visit Diagnoses: Primary osteoarthritis of left knee  Large Joint Inj: L knee on 09/05/2017 3:46 PM Indications: pain Details: 22 G 1.5 in needle, superolateral approach  Arthrogram: No  Medications: 88 mg Hyaluronan 88 MG/4ML; 0.5 mL lidocaine 1 % Aspirate: 10 mL yellow Outcome: tolerated well, no immediate complications Procedure, treatment alternatives, risks and benefits explained, specific risks discussed. Consent was given by the patient. Immediately prior to procedure a time out was called to verify the correct patient, procedure, equipment, support staff and site/side marked as required. Patient was prepped and draped in the usual sterile fashion.     Plan: Discussed with her working on quad strengthening and to just do the exercises that do not aggravate her knee.  She understands she can have cortisone injections no more often than every 3 months and Synvisc 1 injections no more often than every 6 months.  She will follow-up on as-needed basis.

## 2017-10-24 ENCOUNTER — Ambulatory Visit (INDEPENDENT_AMBULATORY_CARE_PROVIDER_SITE_OTHER): Payer: Medicare Other | Admitting: Physician Assistant

## 2017-10-28 ENCOUNTER — Ambulatory Visit (INDEPENDENT_AMBULATORY_CARE_PROVIDER_SITE_OTHER): Payer: Medicare Other | Admitting: Physician Assistant

## 2017-10-28 ENCOUNTER — Encounter (INDEPENDENT_AMBULATORY_CARE_PROVIDER_SITE_OTHER): Payer: Self-pay | Admitting: Physician Assistant

## 2017-10-28 DIAGNOSIS — M1712 Unilateral primary osteoarthritis, left knee: Secondary | ICD-10-CM | POA: Diagnosis not present

## 2017-10-28 MED ORDER — LIDOCAINE HCL 1 % IJ SOLN
3.0000 mL | INTRAMUSCULAR | Status: AC | PRN
  Administered 2017-10-28: 3 mL

## 2017-10-28 MED ORDER — METHYLPREDNISOLONE ACETATE 40 MG/ML IJ SUSP
40.0000 mg | INTRAMUSCULAR | Status: AC | PRN
  Administered 2017-10-28: 40 mg via INTRA_ARTICULAR

## 2017-10-28 NOTE — Progress Notes (Signed)
   Procedure Note  [name]             Date of Birth: October 21, 1945           MRN: [id number]             Visit Date: 10/28/2017  HPI: Crystal Jackson returns today status post Synvisc injection left knee 09/05/2017.  She states the injection Synvisc did not give her any relief.  She is having pain in her knee still this point is asking for a cortisone injection in the left knee.  She has known osteoarthritis of the left knee.  She has had no new injury.  Physical exam left knee: No abnormal warmth or erythema no ecchymosis.  Slight extension lag.  Good flexion beyond 90 degrees.  Tenderness along medial joint line.  Procedures: Visit Diagnoses: Unilateral primary osteoarthritis, left knee  Large Joint Inj on 10/28/2017 10:58 AM Indications: pain Details: 22 G 1.5 in needle, anterolateral approach  Arthrogram: No  Medications: 3 mL lidocaine 1 %; 40 mg methylPREDNISolone acetate 40 MG/ML Outcome: tolerated well, no immediate complications Procedure, treatment alternatives, risks and benefits explained, specific risks discussed. Consent was given by the patient. Immediately prior to procedure a time out was called to verify the correct patient, procedure, equipment, support staff and site/side marked as required. Patient was prepped and draped in the usual sterile fashion.    Plan: She will work on range of motion strengthening of the knee.  Knee strengthening exercises are discussed with her at length.  She is activities as tolerated.  Follow-up with [name] on as-needed basis.  Questions were encouraged and answered.  She understands that she can only have cortisone injections every 3 months in the left knee.

## 2017-12-11 ENCOUNTER — Telehealth (INDEPENDENT_AMBULATORY_CARE_PROVIDER_SITE_OTHER): Payer: Self-pay

## 2017-12-11 NOTE — Telephone Encounter (Signed)
[facility] Monovisc [facility]. Reference# [id number] Valid 01/01/18- 01/01/2019

## 2018-02-10 ENCOUNTER — Ambulatory Visit (INDEPENDENT_AMBULATORY_CARE_PROVIDER_SITE_OTHER): Payer: Medicare Other | Admitting: Physician Assistant

## 2018-02-10 ENCOUNTER — Ambulatory Visit (INDEPENDENT_AMBULATORY_CARE_PROVIDER_SITE_OTHER): Payer: Medicare Other

## 2018-02-10 ENCOUNTER — Encounter (INDEPENDENT_AMBULATORY_CARE_PROVIDER_SITE_OTHER): Payer: Self-pay | Admitting: Physician Assistant

## 2018-02-10 DIAGNOSIS — M1712 Unilateral primary osteoarthritis, left knee: Secondary | ICD-10-CM

## 2018-02-10 MED ORDER — METHYLPREDNISOLONE ACETATE 40 MG/ML IJ SUSP
40.0000 mg | INTRAMUSCULAR | Status: AC | PRN
  Administered 2018-02-10: 40 mg via INTRA_ARTICULAR

## 2018-02-10 MED ORDER — LIDOCAINE HCL 1 % IJ SOLN
5.0000 mL | INTRAMUSCULAR | Status: AC | PRN
  Administered 2018-02-10: 5 mL

## 2018-02-10 NOTE — Progress Notes (Signed)
Office Visit Note   [name]           Date of Birth: April 23, 1945           MRN: [id number] Visit Date: 02/10/2018              Requested by: Albertina Senegal, MD [address] [place], [place] [address] PCP: Albertina Senegal, MD   Assessment & Plan: Visit Diagnoses:  1. Primary osteoarthritis of left knee     Plan: Discussed with patient that her BMI is 41 and the fact that she really needs to have her BMI under 40 prior to considering any type of knee replacement.  She will work on weight loss.  She also work on Dance movement psychotherapist.  Quad strengthening exercises are shown today.  She will follow-up with Dr. Magnus Ivan in 2 months to see what type of response she had to the injection and how she is been doing in regards to weight loss.  Follow-Up Instructions: Return in about 2 months (around 04/11/2018) for Dr. Magnus Ivan.   Orders:  Orders Placed This Encounter  Procedures  . Large Joint Inj  . XR Knee 1-2 Views Left   No orders of the defined types were placed in this encounter.     Procedures: Large Joint Inj on 02/10/2018 10:29 AM Details: superolateral approach Medications: 5 mL lidocaine 1 %; 40 mg methylPREDNISolone acetate 40 MG/ML Aspirate: 38 mL yellow and blood-tinged Consent was given by the patient. Patient was prepped and draped in the usual sterile fashion.       Clinical Data: No additional findings.   Subjective: Chief Complaint  Patient presents with  . Left Knee - Pain    HPI [name] comes in today due to left knee pain and swelling.  She has known arthritis of the left knee.  She has had prior supplemental injection and cortisone injections in the knee however the supplemental injection has not helped her in the past.  She has had no new injury to the knee.  Pains been getting worse since this past Monday she was doing well until then.  She notes some swelling is worse as the day goes on.  She notes decreased range of motion of the  knee secondary to swelling difficulty ambulating due to pain and swelling.  She been using Voltaren gel and Advil without much relief. Review of Systems Please see HPI otherwise negative  Objective: Vital Signs: There were no vitals taken for this visit.  Physical Exam General: Well-developed well-nourished female no acute distress mood affect appropriate. Psych: Alert and oriented x3 Ortho Exam Bilateral knees no abnormal warmth no erythema.  Left knee positive effusion right knee no effusion.  Left knee she has tenderness on medial lateral joint line calves are supple nontender bilaterally.  Peripatellar tenderness left knee.  No instability valgus varus stressing of either knee. Specialty Comments:  No specialty comments available.  Imaging: Xr Knee 1-2 Views Left  Result Date: 02/10/2018 Left knee AP and lateral views: No acute fracture.  Slight advancement of the arthritic changes involving the medial compartment which is now more severe than prior films in June 2019.  Lateral joint line moderate arthritic changes.  Moderate to severe patellofemoral changes.    PMFS History: Patient Active Problem List   Diagnosis Date Noted  . Asthma with acute exacerbation 11/28/2015   Past Medical History:  Diagnosis Date  . Anxiety   . Asthma   . Depression   .  Hypertension     No family history on file.  Past Surgical History:  Procedure Laterality Date  . ABDOMINAL HYSTERECTOMY    . APPENDECTOMY    . CHOLECYSTECTOMY    . KNEE SURGERY Left   . SHOULDER SURGERY Right    Social History   Occupational History  . Not on file  Tobacco Use  . Smoking status: Never Smoker  . Smokeless tobacco: Never Used  Substance and Sexual Activity  . Alcohol use: No    Alcohol/week: 0.0 standard drinks  . Drug use: No  . Sexual activity: Not on file

## 2018-02-24 ENCOUNTER — Other Ambulatory Visit: Payer: Self-pay | Admitting: Pulmonary Disease

## 2018-04-10 ENCOUNTER — Telehealth (INDEPENDENT_AMBULATORY_CARE_PROVIDER_SITE_OTHER): Payer: Self-pay

## 2018-04-10 NOTE — Telephone Encounter (Signed)
Talked with patient and confirmed appointment for Monday, 04/14/2018.  Patient answered NO to all COVID-19 screening questions. 

## 2018-04-14 ENCOUNTER — Other Ambulatory Visit: Payer: Self-pay

## 2018-04-14 ENCOUNTER — Other Ambulatory Visit (INDEPENDENT_AMBULATORY_CARE_PROVIDER_SITE_OTHER): Payer: Self-pay

## 2018-04-14 ENCOUNTER — Ambulatory Visit (INDEPENDENT_AMBULATORY_CARE_PROVIDER_SITE_OTHER): Payer: Medicare Other | Admitting: Orthopaedic Surgery

## 2018-04-14 ENCOUNTER — Encounter (INDEPENDENT_AMBULATORY_CARE_PROVIDER_SITE_OTHER): Payer: Self-pay | Admitting: Orthopaedic Surgery

## 2018-04-14 VITALS — Ht 64.0 in | Wt 243.0 lb

## 2018-04-14 DIAGNOSIS — M1712 Unilateral primary osteoarthritis, left knee: Secondary | ICD-10-CM

## 2018-04-14 DIAGNOSIS — E669 Obesity, unspecified: Secondary | ICD-10-CM

## 2018-04-14 NOTE — Progress Notes (Signed)
Office Visit Note   [name]           Date of Birth: June 06, 1945           MRN: [id number] Visit Date: 04/14/2018              Requested by: Albertina Senegal, MD [address] [place], [place] [address] PCP: Albertina Senegal, MD   Assessment & Plan: Visit Diagnoses:  1. Unilateral primary osteoarthritis, left knee     Plan: She understands that weight loss is important for helping [name] be able to do the best surgery we can for her knee in terms of alignment and her getting good function out of it.  Based on just assessing her soft tissue around her knee I do feel comfortable with performing knee replacement surgery.  However we do need to get her BMI lower and she understands this as well.  She also understands that in light of the coronavirus pandemic that we cannot even schedule or perform any elective surgery such as total joint replacement surgery.  I would like to refer her to the [facility] bariatric clinic with Dr. Dalbert Garnet for evaluation and treatment of her obesity.The patient meets the AMA guidelines for Morbid (severe) obesity with a BMI > 40.0 and I have recommended weight loss.  Follow-Up Instructions: Return in about 3 months (around 07/14/2018).   Orders:  No orders of the defined types were placed in this encounter.  No orders of the defined types were placed in this encounter.     Procedures: No procedures performed   Clinical Data: No additional findings.   Subjective: Chief Complaint  Patient presents with   Left Knee - Follow-up  The patient is a very pleasant 73 year old female we have been seeing for a considerable amount of time.  She has tricompartmental arthritis involving her left knee.  Steroid injections have helped in the past but now is gotten to where they have not helped.  We last placed a steroid in her knee just 2 months ago.  She is someone with a BMI of 41.7.  She is working on quad strengthening exercises and attempting weight  loss.  Physical therapy has helped in the past as well.  She is not a diabetic.  She is a retired Engineer, civil (consulting).  She says that she did lose weight through a guided program years ago and it was up to 40 pound weight loss.  She would like to consider referral to a similar clinic.  Her pain is still activity related.  It does wake her up at night and hurts with weightbearing.  She denies any other acute changes in her medical status.  HPI  Review of Systems She currently denies any headache, chest pain, shortness of breath, fever, chills, nausea, vomiting.  Objective: Vital Signs: Ht 5\' 4"  (1.626 m)    Wt 243 lb (110.2 kg)    BMI 41.71 kg/m   Physical Exam She is alert and orient x3 and in no acute distress Ortho Exam Examination of her left knee she has no effusion.  She has global tenderness but mainly along the left lateral joint line.  She hurts with extreme of flexion and pivoting activities when I rotate the tibia on the femur. Specialty Comments:  No specialty comments available.  Imaging: No results found. Previous x-rays were reviewed shows tricompartment arthritis involving the left knee.  PMFS History: Patient Active Problem List   Diagnosis Date Noted   Asthma with acute  exacerbation 11/28/2015   Past Medical History:  Diagnosis Date   Anxiety    Asthma    Depression    Hypertension     History reviewed. No pertinent family history.  Past Surgical History:  Procedure Laterality Date   ABDOMINAL HYSTERECTOMY     APPENDECTOMY     CHOLECYSTECTOMY     KNEE SURGERY Left    SHOULDER SURGERY Right    Social History   Occupational History   Not on file  Tobacco Use   Smoking status: Never Smoker   Smokeless tobacco: Never Used  Substance and Sexual Activity   Alcohol use: No    Alcohol/week: 0.0 standard drinks   Drug use: No   Sexual activity: Not on file

## 2018-04-15 ENCOUNTER — Ambulatory Visit: Payer: Medicare Other | Admitting: Pulmonary Disease

## 2018-07-09 ENCOUNTER — Encounter (INDEPENDENT_AMBULATORY_CARE_PROVIDER_SITE_OTHER): Payer: Self-pay | Admitting: Family Medicine

## 2018-07-09 ENCOUNTER — Ambulatory Visit (INDEPENDENT_AMBULATORY_CARE_PROVIDER_SITE_OTHER): Payer: Medicare Other | Admitting: Family Medicine

## 2018-07-09 ENCOUNTER — Other Ambulatory Visit: Payer: Self-pay

## 2018-07-09 VITALS — BP 128/79 | HR 80 | Temp 97.9°F | Ht 64.0 in | Wt 242.0 lb

## 2018-07-09 DIAGNOSIS — I10 Essential (primary) hypertension: Secondary | ICD-10-CM

## 2018-07-09 DIAGNOSIS — E559 Vitamin D deficiency, unspecified: Secondary | ICD-10-CM | POA: Diagnosis not present

## 2018-07-09 DIAGNOSIS — Z1331 Encounter for screening for depression: Secondary | ICD-10-CM

## 2018-07-09 DIAGNOSIS — R739 Hyperglycemia, unspecified: Secondary | ICD-10-CM | POA: Diagnosis not present

## 2018-07-09 DIAGNOSIS — R5383 Other fatigue: Secondary | ICD-10-CM | POA: Diagnosis not present

## 2018-07-09 DIAGNOSIS — Z6841 Body Mass Index (BMI) 40.0 and over, adult: Secondary | ICD-10-CM

## 2018-07-09 DIAGNOSIS — R0602 Shortness of breath: Secondary | ICD-10-CM | POA: Diagnosis not present

## 2018-07-09 DIAGNOSIS — Z0289 Encounter for other administrative examinations: Secondary | ICD-10-CM

## 2018-07-09 NOTE — Progress Notes (Signed)
Office: [phone]  /  Fax: [phone]   Dear Dr. Magnus Jackson,   Thank you for referring Crystal Jackson to our clinic. The following note includes my evaluation and treatment recommendations.  HPI:   Chief Complaint: OBESITY    Crystal Jackson has been referred by Crystal Poissonhristopher Blackman, MD for consultation regarding her obesity and obesity related comorbidities.    Crystal Jackson (MR# [id number]) is a 73 y.o. female who presents on 07/09/2018 for obesity evaluation and treatment. Current BMI is Body mass index is 41.54 kg/m. Crystal Jackson has been struggling with her weight for many years and has been unsuccessful in either losing weight, maintaining weight loss, or reaching her healthy weight goal.     Crystal Jackson needs a left knee replacement. For breakfast, she is doing a R[employer] English muffin, 1 egg, 1 canadian bacon slice, 1/2 slice of cheese, almond milk, coffee (fat free creamer and Splenda) (feels full). For snack, around 10 or 10:30 am she is doing an Atkins snack bar. For lunch, she is doing homemade cheeseburger 4 oz, with tomato, mayo, cheese, air fried french fries, and tea with Splenda. Then again snacks around 3:30 or 4 pm, peanut butter crackers. For dinner, she is doing frozen Banquet salisbury steak patties, or Stouffers lasagna (1.5 cup), and vegetables (1 cup).     Crystal Jackson attended our information session and states she is currently in the action stage of change and ready to dedicate time achieving and maintaining a healthier weight. Crystal Jackson is interested in becoming our patient and working on intensive lifestyle modifications including (but not limited to) diet, exercise and weight loss.    Crystal Jackson states her family eats meals together she thinks her family will eat healthier with  her she struggles with family and or coworkers weight loss sabotage her desired weight loss is 72 lbs she has been heavy most of  her life she started gaining weight after remarrying her heaviest weight  ever was 245 lbs she has significant food cravings issues  she snacks frequently in the evenings she wakes up frquently in the middle of the night to eat she is frequently drinking liquids with calories she frequently makes poor food choices she frequently eats larger portions than normal  she struggles with emotional eating    Fatigue Crystal Jackson feels her energy is lower than it should be. This has worsened with weight gain and has not worsened recently. Crystal Jackson admits to daytime somnolence and  admits to waking up still tired. Patient has a history of obstructive sleep apnea with the use of CPAP. Patent has a history of symptoms of daytime fatigue. Patient generally gets 5 hours of sleep per night, and states they generally have nightime awakenings. Snoring is not present. Apneic episodes are present. Epworth Sleepiness Score is 6.  Dyspnea on exertion Crystal Jackson notes increasing shortness of breath with exercising and seems to be worsening over time with weight gain. She notes getting out of breath sooner with activity than she used to. This has not gotten worse recently. EKG-normal sinus rhythm at 79 BPM. Crystal Jackson denies orthopnea.  Elevated Blood Sugar Crystal Jackson's last fasting blood sugar was at 100, but no history of hypoglycemia.  Vitamin D Deficiency Crystal Jackson has a diagnosis of vitamin D deficiency. She is on OTC Vit D 1,000 IU daily, and she has been on OTC for years. She denies nausea, vomiting or muscle weakness.  Hypertension Crystal Jackson is a 73 y.o. female with hypertension. Crystal Jackson's blood pressure is controlled today. EKG with normal sinus rhythm  at 79 BPM. She denies chest pain, chest pressure, or headaches. She is on hydrochlorothiazide, Doxazosin, metoprolol. She is working on weight loss to help control her blood pressure with the goal of decreasing her risk of heart attack and stroke.   Depression Screen Crystal Jackson's Food and Mood (modified PHQ-9) score was  Depression screen PHQ  2/9 07/09/2018  Decreased Interest 3  Down, Depressed, Hopeless 2  PHQ - 2 Score 5  Altered sleeping 3  Tired, decreased energy 3  Change in appetite 2  Feeling bad or failure about yourself  2  Trouble concentrating 2  Moving slowly or fidgety/restless 3  Suicidal thoughts 0  PHQ-9 Score 20  Difficult doing work/chores Very difficult    ASSESSMENT AND PLAN:  Other fatigue - Plan: EKG 12-Lead  Shortness of breath on exertion  Elevated blood sugar - Plan: Hemoglobin A1c, Insulin, random  Vitamin D deficiency  Essential hypertension  Depression screening  Class 3 severe obesity with serious comorbidity and body mass index (BMI) of 40.0 to 44.9 in adult, unspecified obesity type (HCC)  PLAN:  Fatigue Crystal Jackson was informed that her fatigue may be related to obesity, depression or many other causes. Labs will be ordered, and in the meanwhile Crystal Jackson has agreed to work on diet, exercise and weight loss to help with fatigue. Proper sleep hygiene was discussed including the need for 7-8 hours of quality sleep each night. A sleep study was not ordered based on symptoms and Epworth score.  Dyspnea on exertion Crystal Jackson's shortness of breath appears to be obesity related and exercise induced. She has agreed to work on weight loss and gradually increase exercise to treat her exercise induced shortness of breath. If Crystal Jackson and loses weight without improvement of her shortness of breath, we will plan to refer to pulmonology. We will monitor this condition regularly. Crystal Jackson agrees to this plan.  Elevated Blood Sugar We will check Hgb A1c and insulin level today. Crystal Jackson agrees to follow up with our clinic in 2 weeks.   Vitamin D Deficiency Crystal Jackson was informed that low vitamin D levels contributes to fatigue and are associated with obesity, breast, and colon cancer. Crystal Jackson agrees to continue taking OTC Vit D and will follow up for routine testing of vitamin D, at  least 2-3 times per year. She was informed of the risk of over-replacement of vitamin D and agrees to not increase her dose unless she discusses this with [name] first. We will check Vit D level today. Crystal Jackson agrees to follow up with our clinic in 2 weeks.  Hypertension We discussed sodium restriction, working on healthy weight loss, and a regular exercise program as the means to achieve improved blood pressure control. Crystal Jackson agreed with this plan and agreed to follow up as directed. We will continue to monitor her blood pressure as well as her progress with the above lifestyle modifications. Crystal Jackson agrees to continue her medications as prescribed and will watch for signs of hypotension as she continues her lifestyle modifications. EKG was done, and we will check CMP and FLP today. Crystal Jackson agrees to follow up with our clinic in 2 weeks.  Depression Screen Crystal Jackson had a strongly positive depression screening. Depression is commonly associated with obesity and often results in emotional eating behaviors. We will monitor this closely and work on CBT to help improve the non-hunger eating patterns. Referral to Psychology may be required if no improvement is seen as she continues in our clinic.  Obesity Crystal Jackson is currently  in the action stage of change and her goal is to continue with weight loss efforts. I recommend Crystal Jackson begin the structured treatment plan as follows:  She has agreed to follow the Category 2 plan + 100 calories Crystal Jackson has been instructed to eventually work up to a goal of 150 minutes of combined cardio and strengthening exercise per week for weight loss and overall health benefits. We discussed the following Behavioral Modification Strategies today: increasing lean protein intake, increasing vegetables and work on meal planning and easy cooking plans, keeping healthy foods in the home, better snacking choices, and planning for success   She was informed of the importance of frequent  follow up visits to maximize her success with intensive lifestyle modifications for her multiple health conditions. She was informed we would discuss her lab results at her next visit unless there is a critical issue that needs to be addressed sooner. Crystal Jackson agreed to keep her next visit at the agreed upon time to discuss these results.  ALLERGIES: Allergies  Allergen Reactions  . Dextromethorphan Hives and Shortness Of Breath  . Guaifenesin & Derivatives Hives and Shortness Of Breath  . Valtrex [Valacyclovir] Hives and Shortness Of Breath  . Citalopram     Other reaction(s): Chills (intolerance)   . Codeine Nausea And Vomiting  . Duloxetine     Feel like in a fog Other reaction(s): Other (See Comments) Feel like in a fog   . Avapro [Irbesartan] Rash  . Penicillin G Rash  . Vilazodone Nausea Only    MEDICATIONS: Current Outpatient Medications on File Prior to Visit  Medication Sig Dispense Refill  . ARNUITY ELLIPTA 200 MCG/ACT AEPB INHALE 1 PUFF INTO THE LUNGS DAILY 30 each 5  . Ascorbic Acid (VITAMIN C) 100 MG tablet Take 100 mg by mouth daily.    . Calcium Carbonate-Vitamin D (CALCIUM-VITAMIN D) 500-200 MG-UNIT tablet Take by mouth.    . Cholecalciferol (VITAMIN D-3) 25 MCG (1000 UT) CAPS Take by mouth.    . doxazosin (CARDURA) 1 MG tablet Take 1 mg by mouth daily.    . hydrochlorothiazide (HYDRODIURIL) 25 MG tablet Take 25 mg by mouth daily.     . metoprolol tartrate (LOPRESSOR) 25 MG tablet Take 25-50 mg by mouth 2 (two) times daily. 25 mg in the am and 50 mg at night    . Multiple Vitamin (MULTIVITAMIN) tablet Take 1 tablet by mouth daily.     . Omega-3 Fatty Acids (FISH OIL) 500 MG CAPS Take by mouth.    . pantoprazole (PROTONIX) 40 MG tablet Take 40 mg by mouth daily.     . potassium chloride (K-DUR) 10 MEQ tablet TAKE 2 TABLETS BY MOUTH TWICE DAILY    . SUPER B COMPLEX/C PO Take by mouth.    . Turmeric (QC TUMERIC COMPLEX PO) Take by mouth 2 (two) times a day.    .  venlafaxine XR (EFFEXOR-XR) 75 MG 24 hr capsule TK 3 CS PO D  3  . albuterol (PROAIR HFA) 108 (90 Base) MCG/ACT inhaler Inhale into the lungs.    . chlorpheniramine (CHLOR-TRIMETON) 4 MG tablet Take 1 tablet (4 mg total) by mouth every 6 (six) hours. (Patient not taking: Reported on 07/09/2018) 120 tablet 0  . clotrimazole-betamethasone (LOTRISONE) cream Qd for rash    . diclofenac (VOLTAREN) 75 MG EC tablet TAKE 1 TABLET BY MOUTH TWICE DAILY WITH FOOD FOR 2 WEEKS THEN AS NEEDED AFTER THAT (Patient not taking: Reported on 07/09/2018) 180 tablet 1  .  dicyclomine (BENTYL) 20 MG tablet Take 20 mg by mouth 4 (four) times daily -  before meals and at bedtime.     . famotidine (PEPCID) 20 MG tablet Take 1 tablet (20 mg total) by mouth 2 (two) times daily. (Patient not taking: Reported on 07/09/2018) 14 tablet 0  . fluticasone (FLONASE) 50 MCG/ACT nasal spray Place 2 sprays into both nostrils daily. (Patient not taking: Reported on 07/09/2018) 16 g 5  . gabapentin (NEURONTIN) 100 MG capsule TK 1 TO 2 CS PO HS  11  . QSYMIA 7.5-46 MG CP24 TAKE 1 CAPSULE BY MOUTH ONCE DAILY BEFORE BREAKFAST  5   No current facility-administered medications on file prior to visit.     PAST MEDICAL HISTORY: Past Medical History:  Diagnosis Date  . Anxiety   . Arthritis   . Asthma   . Back pain   . Bilateral swelling of feet   . Constipation   . Depression   . Gallbladder problem   . GERD (gastroesophageal reflux disease)   . History of low potassium   . Hypertension   . Joint pain   . Obesity   . Palpitations   . Shortness of breath   . Sleep apnea     PAST SURGICAL HISTORY: Past Surgical History:  Procedure Laterality Date  . ABDOMINAL HYSTERECTOMY    . APPENDECTOMY    . CHOLECYSTECTOMY    . KNEE SURGERY Left   . SHOULDER SURGERY Right     SOCIAL HISTORY: Social History   Tobacco Use  . Smoking status: Never Smoker  . Smokeless tobacco: Never Used  Substance Use Topics  . Alcohol use: No     Alcohol/week: 0.0 standard drinks  . Drug use: No    FAMILY HISTORY: Family History  Problem Relation Age of Onset  . Diabetes Mother   . Heart disease Mother   . Anxiety disorder Mother   . Obesity Mother   . Diabetes Father   . Hypertension Father   . Obesity Father     ROS: Review of Systems  Constitutional: Positive for malaise/fatigue. Negative for weight loss.       + Trouble sleeping  HENT:       + Decreased hearing + Nasal discharge + Dry mouth  Eyes:       + Wear glasses or contacts + Floaters  Respiratory: Positive for shortness of breath (with exertion) and wheezing.   Cardiovascular: Positive for palpitations. Negative for chest pain and orthopnea.       Negative chest pressure  Gastrointestinal: Positive for constipation and heartburn. Negative for nausea and vomiting.  Musculoskeletal: Positive for back pain.       Negative muscle weakness + Muscle or joint pain + Muscle stiffness  Skin:       + Dryness + Hair or nail changes  Neurological: Positive for weakness. Negative for headaches.  Endo/Heme/Allergies:       Negative hypoglycemia  Psychiatric/Behavioral: Positive for depression. Negative for suicidal ideas.    PHYSICAL EXAM: Blood pressure 128/79, pulse 80, temperature 97.9 F (36.6 C), temperature source Oral, height 5\' 4"  (1.626 m), weight 242 lb (109.8 kg), SpO2 94 %. Body mass index is 41.54 kg/m. Physical Exam Vitals signs reviewed.  Constitutional:      Appearance: Normal appearance. She is obese.  HENT:     Head: Normocephalic and atraumatic.     Nose: Nose normal.  Eyes:     General: No scleral icterus.    Extraocular  Movements: Extraocular movements intact.  Neck:     Musculoskeletal: Normal range of motion and neck supple.  Cardiovascular:     Rate and Rhythm: Normal rate and regular rhythm.     Pulses: Normal pulses.     Heart sounds: Normal heart sounds.  Pulmonary:     Effort: Pulmonary effort is normal. No respiratory  distress.     Breath sounds: Normal breath sounds.  Abdominal:     Palpations: Abdomen is soft.     Tenderness: There is no abdominal tenderness.     Comments: + Obesity  Musculoskeletal: Normal range of motion.     Right lower leg: No edema.     Left lower leg: No edema.  Skin:    General: Skin is warm and dry.  Neurological:     Mental Status: She is alert and oriented to person, place, and time.     Coordination: Coordination normal.  Psychiatric:        Mood and Affect: Mood normal.        Behavior: Behavior normal.     RECENT LABS AND TESTS: BMET    Component Value Date/Time   NA 135 10/24/2015 1306   K 3.5 10/24/2015 1306   CL 97 (L) 10/24/2015 1306   CO2 27 10/24/2015 1306   GLUCOSE 100 (H) 10/24/2015 1306   BUN 13 10/24/2015 1306   CREATININE 1.02 (H) 10/24/2015 1306   CALCIUM 9.2 10/24/2015 1306   GFRNONAA 55 (L) 10/24/2015 1306   GFRAA >60 10/24/2015 1306   No results found for: HGBA1C No results found for: INSULIN CBC    Component Value Date/Time   WBC 5.6 10/24/2015 1306   RBC 4.17 10/24/2015 1306   HGB 12.1 10/24/2015 1306   HCT 36.1 10/24/2015 1306   PLT 234 10/24/2015 1306   MCV 86.6 10/24/2015 1306   MCH 29.0 10/24/2015 1306   MCHC 33.5 10/24/2015 1306   RDW 13.3 10/24/2015 1306   LYMPHSABS 2.1 10/24/2015 1306   MONOABS 0.4 10/24/2015 1306   EOSABS 0.2 10/24/2015 1306   BASOSABS 0.0 10/24/2015 1306   Iron/TIBC/Ferritin/ %Sat No results found for: IRON, TIBC, FERRITIN, IRONPCTSAT Lipid Panel  No results found for: CHOL, TRIG, HDL, CHOLHDL, VLDL, LDLCALC, LDLDIRECT Hepatic Function Panel     Component Value Date/Time   PROT 6.7 10/24/2015 1306   ALBUMIN 3.6 10/24/2015 1306   AST 32 10/24/2015 1306   ALT 27 10/24/2015 1306   ALKPHOS 35 (L) 10/24/2015 1306   BILITOT 0.5 10/24/2015 1306   No results found for: TSH  ECG  shows NSR with a rate of 79 BPM INDIRECT CALORIMETER done today shows a VO2 of 231 and a REE of 1605.  Her calculated  basal metabolic rate is [id number] thus her basal metabolic rate is worse than expected.       OBESITY BEHAVIORAL INTERVENTION VISIT  Today's visit was # 1   Starting weight: 242 lbs Starting date: 07/09/2018 Today's weight : 242 lbs Today's date: 07/09/2018 Total lbs lost to date: 0 At least 15 minutes were spent on discussing the following behavioral intervention visit.   ASK: We discussed the diagnosis of obesity with Crystal Crouchebecca Jackson today and Crystal Jackson agreed to give us permission to discuss obesity behavioral modification therapy today.  ASSESS: Crystal Jackson has the diagnosis of obesity and her BMI today is 41.52 Crystal Jackson is in the action stage of change   ADVISE: Crystal Jackson was educated on the multiple health risks of obesity as well as the  benefit of weight loss to improve her health. She was advised of the need for long term treatment and the importance of lifestyle modifications to improve her current health and to decrease her risk of future health problems.  AGREE: Multiple dietary modification options and treatment options were discussed and  Mizani agreed to follow the recommendations documented in the above note.  ARRANGE: Elenora was educated on the importance of frequent visits to treat obesity as outlined per CMS and USPSTF guidelines and agreed to schedule her next follow up appointment today.  I, Burt Knack, am acting as transcriptionist for Debbra Riding, MD   I have reviewed the above documentation for accuracy and completeness, and I agree with the above. - Debbra Riding, MD

## 2018-07-10 LAB — CBC WITH DIFFERENTIAL/PLATELET
Basophils Absolute: 0 10*3/uL (ref 0.0–0.2)
Basos: 1 %
EOS (ABSOLUTE): 0.2 10*3/uL (ref 0.0–0.4)
Eos: 3 %
Hematocrit: 39.7 % (ref 34.0–46.6)
Hemoglobin: 13.6 g/dL (ref 11.1–15.9)
Immature Grans (Abs): 0 10*3/uL (ref 0.0–0.1)
Immature Granulocytes: 0 %
Lymphocytes Absolute: 2 10*3/uL (ref 0.7–3.1)
Lymphs: 33 %
MCH: 30.4 pg (ref 26.6–33.0)
MCHC: 34.3 g/dL (ref 31.5–35.7)
MCV: 89 fL (ref 79–97)
Monocytes Absolute: 0.4 10*3/uL (ref 0.1–0.9)
Monocytes: 6 %
Neutrophils Absolute: 3.4 10*3/uL (ref 1.4–7.0)
Neutrophils: 57 %
Platelets: 241 10*3/uL (ref 150–450)
RBC: 4.48 x10E6/uL (ref 3.77–5.28)
RDW: 13.1 % (ref 11.7–15.4)
WBC: 6 10*3/uL (ref 3.4–10.8)

## 2018-07-10 LAB — T4, FREE: Free T4: 1.02 ng/dL (ref 0.82–1.77)

## 2018-07-10 LAB — HEMOGLOBIN A1C
Est. average glucose Bld gHb Est-mCnc: 120 mg/dL
Hgb A1c MFr Bld: 5.8 % — ABNORMAL HIGH (ref 4.8–5.6)

## 2018-07-10 LAB — COMPREHENSIVE METABOLIC PANEL
ALT: 25 IU/L (ref 0–32)
AST: 23 IU/L (ref 0–40)
Albumin/Globulin Ratio: 2 (ref 1.2–2.2)
Albumin: 4.5 g/dL (ref 3.7–4.7)
Alkaline Phosphatase: 46 IU/L (ref 39–117)
BUN/Creatinine Ratio: 16 (ref 12–28)
BUN: 16 mg/dL (ref 8–27)
Bilirubin Total: 0.3 mg/dL (ref 0.0–1.2)
CO2: 25 mmol/L (ref 20–29)
Calcium: 9.7 mg/dL (ref 8.7–10.3)
Chloride: 94 mmol/L — ABNORMAL LOW (ref 96–106)
Creatinine, Ser: 0.98 mg/dL (ref 0.57–1.00)
GFR calc Af Amer: 67 mL/min/{1.73_m2} (ref >59)
GFR calc non Af Amer: 58 mL/min/{1.73_m2} — ABNORMAL LOW (ref >59)
Globulin, Total: 2.3 g/dL (ref 1.5–4.5)
Glucose: 91 mg/dL (ref 65–99)
Potassium: 4.4 mmol/L (ref 3.5–5.2)
Sodium: 136 mmol/L (ref 134–144)
Total Protein: 6.8 g/dL (ref 6.0–8.5)

## 2018-07-10 LAB — INSULIN, RANDOM: INSULIN: 25 u[IU]/mL — ABNORMAL HIGH (ref 2.6–24.9)

## 2018-07-10 LAB — VITAMIN D 25 HYDROXY (VIT D DEFICIENCY, FRACTURES): Vit D, 25-Hydroxy: 58 ng/mL (ref 30.0–100.0)

## 2018-07-10 LAB — LIPID PANEL WITH LDL/HDL RATIO
Cholesterol, Total: 158 mg/dL (ref 100–199)
HDL: 54 mg/dL (ref >39)
LDL Calculated: 86 mg/dL (ref 0–99)
LDl/HDL Ratio: 1.6 ratio (ref 0.0–3.2)
Triglycerides: 92 mg/dL (ref 0–149)
VLDL Cholesterol Cal: 18 mg/dL (ref 5–40)

## 2018-07-10 LAB — TSH: TSH: 2.21 u[IU]/mL (ref 0.450–4.500)

## 2018-07-10 LAB — T3: T3, Total: 88 ng/dL (ref 71–180)

## 2018-07-14 ENCOUNTER — Ambulatory Visit: Payer: Self-pay | Admitting: Orthopaedic Surgery

## 2018-07-16 ENCOUNTER — Other Ambulatory Visit: Payer: Self-pay

## 2018-07-16 ENCOUNTER — Ambulatory Visit (INDEPENDENT_AMBULATORY_CARE_PROVIDER_SITE_OTHER): Payer: Medicare Other | Admitting: Psychology

## 2018-07-16 DIAGNOSIS — F330 Major depressive disorder, recurrent, mild: Secondary | ICD-10-CM | POA: Diagnosis not present
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
## 2018-07-22 ENCOUNTER — Other Ambulatory Visit (INDEPENDENT_AMBULATORY_CARE_PROVIDER_SITE_OTHER): Payer: Self-pay | Admitting: Physician Assistant

## 2018-07-22 NOTE — Telephone Encounter (Signed)
Ok to rf? 

## 2018-07-23 ENCOUNTER — Other Ambulatory Visit: Payer: Self-pay

## 2018-07-23 ENCOUNTER — Encounter (INDEPENDENT_AMBULATORY_CARE_PROVIDER_SITE_OTHER): Payer: Self-pay | Admitting: Family Medicine

## 2018-07-23 ENCOUNTER — Other Ambulatory Visit (INDEPENDENT_AMBULATORY_CARE_PROVIDER_SITE_OTHER): Payer: Self-pay | Admitting: Family Medicine

## 2018-07-23 ENCOUNTER — Other Ambulatory Visit (INDEPENDENT_AMBULATORY_CARE_PROVIDER_SITE_OTHER): Payer: Self-pay | Admitting: Physician Assistant

## 2018-07-23 ENCOUNTER — Ambulatory Visit (INDEPENDENT_AMBULATORY_CARE_PROVIDER_SITE_OTHER): Payer: Medicare Other | Admitting: Family Medicine

## 2018-07-23 VITALS — BP 153/67 | HR 72 | Temp 98.0°F | Ht 64.0 in | Wt 240.0 lb

## 2018-07-23 DIAGNOSIS — I10 Essential (primary) hypertension: Secondary | ICD-10-CM

## 2018-07-23 DIAGNOSIS — Z6841 Body Mass Index (BMI) 40.0 and over, adult: Secondary | ICD-10-CM

## 2018-07-23 DIAGNOSIS — R7303 Prediabetes: Secondary | ICD-10-CM | POA: Diagnosis not present

## 2018-07-23 MED ORDER — CHLORTHALIDONE 25 MG PO TABS: 12.5000 mg | ORAL_TABLET | Freq: Every day | ORAL | 0 refills | Status: DC

## 2018-07-23 NOTE — Progress Notes (Signed)
Office: [phone]  /  Fax: [phone]   HPI:   Chief Complaint: OBESITY Crystal Jackson is here to discuss her progress with her obesity treatment plan. She is on the Category 2 plan + 100 calories and is following her eating plan approximately 60 % of the time. She states she is working around the house. Crystal Jackson found she was slightly confused with the meal plan. She got must of the food on the plan, but not all secondary to her husband having severe episode of diverticulitis. The hardest part was remembering that she was on a diet. She is doing Atkins bars and Slim Fast shakes for snacks.  Her weight is 240 lb (108.9 kg) today and has had a weight loss of 2 pounds over a period of 2 weeks since her last visit. She has lost 2 lbs since starting treatment with us.  Pre-Diabetes Crystal Jackson has a diagnosis of pre-diabetes based on her elevated Hgb A1c and was informed this puts her at greater risk of developing diabetes. Last Hgb A1c was of 5.8 and insulin of 25.0. She is not taking metformin currently and continues to work on diet and exercise to decrease risk of diabetes. She denies nausea or hypoglycemia.  Hypertension Crystal CrouchRebecca Jackson is a 73 y.o. female with hypertension. Crystal Jackson's blood pressure is slightly elevated today, but she voices she was rushing around today. She denies chest pain, chest pressure, or headaches. She is working on weight loss to help control her blood pressure with the goal of decreasing her risk of heart attack and stroke.   ASSESSMENT AND PLAN:  Prediabetes  Essential hypertension - Plan: chlorthalidone (HYGROTON) 25 MG tablet  Class 3 severe obesity with serious comorbidity and body mass index (BMI) of 40.0 to 44.9 in adult, unspecified obesity type [facility](HCC)  PLAN:  Pre-Diabetes Crystal Jackson will continue with Category 2, and will continue to work on weight loss, exercise, and decreasing simple carbohydrates in her diet to help decrease the risk of diabetes. We dicussed  metformin including benefits and risks. She was informed that eating too many simple carbohydrates or too many calories at one sitting increases the likelihood of GI side effects. Crystal Jackson declined metformin for now and a prescription was not written today. We will repeat labs in 3 months. Crystal Jackson agrees to follow up with our clinic in 2 weeks as directed to monitor her progress.  Hypertension We discussed sodium restriction, working on healthy weight loss, and a regular exercise program as the means to achieve improved blood pressure control. Crystal Jackson agreed with this plan and agreed to follow up as directed. We will continue to monitor her blood pressure as well as her progress with the above lifestyle modifications. She will watch for signs of hypotension as she continues her lifestyle modifications. We will follow up on her blood pressure at next appointment. Crystal Jackson is to stop hydrochlorothiazide, and she agrees to start chlorthalidone 12.5 mg PO q daily #30 with no refills. Crystal Jackson agrees to follow up with our clinic in 2 weeks.   Obesity Crystal Jackson is currently in the action stage of change. As such, her goal is to continue with weight loss efforts She has agreed to follow the Category 2 plan + 100 calories Crystal Jackson has been instructed to work up to a goal of 150 minutes of combined cardio and strengthening exercise per week for weight loss and overall health benefits. We discussed the following Behavioral Modification Strategies today: increasing lean protein intake, increasing vegetables and work on meal planning and  easy cooking plans, keeping healthy foods in the home, and planning for success   Crystal Jackson has agreed to follow up with our clinic in 2 weeks. She was informed of the importance of frequent follow up visits to maximize her success with intensive lifestyle modifications for her multiple health conditions.  ALLERGIES: Allergies  Allergen Reactions   Dextromethorphan Hives and  Shortness Of Breath   Guaifenesin & Derivatives Hives and Shortness Of Breath   Valtrex [Valacyclovir] Hives and Shortness Of Breath   Citalopram     Other reaction(s): Chills (intolerance)    Codeine Nausea And Vomiting   Duloxetine     Feel like in a fog Other reaction(s): Other (See Comments) Feel like in a fog    Avapro [Irbesartan] Rash   Penicillin G Rash   Vilazodone Nausea Only    MEDICATIONS: Current Outpatient Medications on File Prior to Visit  Medication Sig Dispense Refill   albuterol (PROAIR HFA) 108 (90 Base) MCG/ACT inhaler Inhale into the lungs.     ARNUITY ELLIPTA 200 MCG/ACT AEPB INHALE 1 PUFF INTO THE LUNGS DAILY 30 each 5   Ascorbic Acid (VITAMIN C) 100 MG tablet Take 100 mg by mouth daily.     Calcium Carbonate-Vitamin D (CALCIUM-VITAMIN D) 500-200 MG-UNIT tablet Take by mouth.     chlorpheniramine (CHLOR-TRIMETON) 4 MG tablet Take 1 tablet (4 mg total) by mouth every 6 (six) hours. 120 tablet 0   Cholecalciferol (VITAMIN D-3) 25 MCG (1000 UT) CAPS Take by mouth.     clotrimazole-betamethasone (LOTRISONE) cream Qd for rash     dicyclomine (BENTYL) 20 MG tablet Take 20 mg by mouth 4 (four) times daily -  before meals and at bedtime.      doxazosin (CARDURA) 1 MG tablet Take 1 mg by mouth daily.     famotidine (PEPCID) 20 MG tablet Take 1 tablet (20 mg total) by mouth 2 (two) times daily. 14 tablet 0   fluticasone (FLONASE) 50 MCG/ACT nasal spray Place 2 sprays into both nostrils daily. 16 g 5   gabapentin (NEURONTIN) 100 MG capsule TK 1 TO 2 CS PO HS  11   metoprolol tartrate (LOPRESSOR) 25 MG tablet Take 25-50 mg by mouth 2 (two) times daily. 25 mg in the am and 50 mg at night     Multiple Vitamin (MULTIVITAMIN) tablet Take 1 tablet by mouth daily.      Omega-3 Fatty Acids (FISH OIL) 500 MG CAPS Take by mouth.     pantoprazole (PROTONIX) 40 MG tablet Take 40 mg by mouth daily.      potassium chloride (K-DUR) 10 MEQ tablet TAKE 2  TABLETS BY MOUTH TWICE DAILY     SUPER B COMPLEX/C PO Take by mouth.     Turmeric (QC TUMERIC COMPLEX PO) Take by mouth 2 (two) times a day.     venlafaxine XR (EFFEXOR-XR) 75 MG 24 hr capsule TK 3 CS PO D  3   No current facility-administered medications on file prior to visit.     PAST MEDICAL HISTORY: Past Medical History:  Diagnosis Date   Anxiety    Arthritis    Asthma    Back pain    Bilateral swelling of feet    Constipation    Depression    Gallbladder problem    GERD (gastroesophageal reflux disease)    History of low potassium    Hypertension    Joint pain    Obesity    Palpitations    Shortness  of breath    Sleep apnea     PAST SURGICAL HISTORY: Past Surgical History:  Procedure Laterality Date   ABDOMINAL HYSTERECTOMY     APPENDECTOMY     CHOLECYSTECTOMY     KNEE SURGERY Left    SHOULDER SURGERY Right     SOCIAL HISTORY: Social History   Tobacco Use   Smoking status: Never Smoker   Smokeless tobacco: Never Used  Substance Use Topics   Alcohol use: No    Alcohol/week: 0.0 standard drinks   Drug use: No    FAMILY HISTORY: Family History  Problem Relation Age of Onset   Diabetes Mother    Heart disease Mother    Anxiety disorder Mother    Obesity Mother    Diabetes Father    Hypertension Father    Obesity Father     ROS: Review of Systems  Constitutional: Positive for weight loss.  Cardiovascular: Negative for chest pain.       Negative chest pressure  Gastrointestinal: Negative for nausea.  Neurological: Negative for headaches.  Endo/Heme/Allergies:       Negative hypoglycemia    PHYSICAL EXAM: Blood pressure (!) 153/67, pulse 72, temperature 98 F (36.7 C), temperature source Oral, height 5\' 4"  (1.626 m), weight 240 lb (108.9 kg), SpO2 95 %. Body mass index is 41.2 kg/m. Physical Exam Vitals signs reviewed.  Constitutional:      Appearance: Normal appearance. She is obese.  Cardiovascular:      Rate and Rhythm: Normal rate.     Pulses: Normal pulses.  Pulmonary:     Effort: Pulmonary effort is normal.     Breath sounds: Normal breath sounds.  Musculoskeletal: Normal range of motion.  Skin:    General: Skin is warm and dry.  Neurological:     Mental Status: She is alert and oriented to person, place, and time.  Psychiatric:        Mood and Affect: Mood normal.        Behavior: Behavior normal.     RECENT LABS AND TESTS: BMET    Component Value Date/Time   NA 136 07/09/2018 1609   K 4.4 07/09/2018 1609   CL 94 (L) 07/09/2018 1609   CO2 25 07/09/2018 1609   GLUCOSE 91 07/09/2018 1609   GLUCOSE 100 (H) 10/24/2015 1306   BUN 16 07/09/2018 1609   CREATININE 0.98 07/09/2018 1609   CALCIUM 9.7 07/09/2018 1609   GFRNONAA 58 (L) 07/09/2018 1609   GFRAA 67 07/09/2018 1609   Lab Results  Component Value Date   HGBA1C 5.8 (H) 07/09/2018   Lab Results  Component Value Date   INSULIN 25.0 (H) 07/09/2018   CBC    Component Value Date/Time   WBC 6.0 07/09/2018 1609   WBC 5.6 10/24/2015 1306   RBC 4.48 07/09/2018 1609   RBC 4.17 10/24/2015 1306   HGB 13.6 07/09/2018 1609   HCT 39.7 07/09/2018 1609   PLT 241 07/09/2018 1609   MCV 89 07/09/2018 1609   MCH 30.4 07/09/2018 1609   MCH 29.0 10/24/2015 1306   MCHC 34.3 07/09/2018 1609   MCHC 33.5 10/24/2015 1306   RDW 13.1 07/09/2018 1609   LYMPHSABS 2.0 07/09/2018 1609   MONOABS 0.4 10/24/2015 1306   EOSABS 0.2 07/09/2018 1609   BASOSABS 0.0 07/09/2018 1609   Iron/TIBC/Ferritin/ %Sat No results found for: IRON, TIBC, FERRITIN, IRONPCTSAT Lipid Panel     Component Value Date/Time   CHOL 158 07/09/2018 1609   TRIG 92 07/09/2018  1609   HDL 54 07/09/2018 1609   LDLCALC 86 07/09/2018 1609   Hepatic Function Panel     Component Value Date/Time   PROT 6.8 07/09/2018 1609   ALBUMIN 4.5 07/09/2018 1609   AST 23 07/09/2018 1609   ALT 25 07/09/2018 1609   ALKPHOS 46 07/09/2018 1609   BILITOT 0.3 07/09/2018  1609      Component Value Date/Time   TSH 2.210 07/09/2018 1609      OBESITY BEHAVIORAL INTERVENTION VISIT  Today's visit was # 2   Starting weight: 242 lbs Starting date: 07/09/2018 Today's weight : 240 lbs Today's date: 07/23/2018 Total lbs lost to date: 2 At least 15 minutes were spent on discussing the following behavioral intervention visit.   ASK: We discussed the diagnosis of obesity with Crystal Crouchebecca Jackson today and Crystal Jackson agreed to give us permission to discuss obesity behavioral modification therapy today.  ASSESS: Crystal Jackson has the diagnosis of obesity and her BMI today is 41.18 Crystal Jackson is in the action stage of change   ADVISE: Crystal Jackson was educated on the multiple health risks of obesity as well as the benefit of weight loss to improve her health. She was advised of the need for long term treatment and the importance of lifestyle modifications to improve her current health and to decrease her risk of future health problems.  AGREE: Multiple dietary modification options and treatment options were discussed and  Crystal Jackson agreed to follow the recommendations documented in the above note.  ARRANGE: Crystal Jackson was educated on the importance of frequent visits to treat obesity as outlined per CMS and USPSTF guidelines and agreed to schedule her next follow up appointment today.  I, Burt KnackSharon Martin, am acting as transcriptionist for Debbra RidingAlexandria Kadolph, MD  I have reviewed the above documentation for accuracy and completeness, and I agree with the above. - Debbra RidingAlexandria Kadolph, MD

## 2018-07-23 NOTE — Telephone Encounter (Signed)
Please advise. Patient is requesting 90 day supply.

## 2018-07-29 NOTE — Progress Notes (Signed)
Office: [phone]  /  Fax: [phone]    Date: July 31, 2018   Appointment Start Time: 12:04pm Duration: 25 minutes Provider: Lawerance CruelGaytri Isabellah Sobocinski, Psy.D. Type of Session: Individual Therapy  Location of Patient: Home Location of Provider: [facility] Type of Contact: Telepsychological Visit via [employer]   Session Content: Crystal Jackson is a [age] y.o. female presenting via [employer] Web[employer] for a follow-up appointment to address the previously established treatment goal of decreasing emotional eating. Of note, this provider called Crystal Joinerebecca at 12:02pm as she did not present for today's appointment. She indicated she lost track of time. As such, today's appointment was initiated 4 minutes late. Today's appointment was a telepsychological visit, as this provider's clinic is seeing a limited number of patients for in-person visits due to COVID-19. Therapeutic services will resume to in-person appointments once deemed appropriate. Crystal Jackson expressed understanding regarding the rationale for telepsychological services, and provided verbal consent for today's appointment. Prior to proceeding with today's appointment, Sharmayne's physical location at the time of this appointment was obtained. Crystal Jackson reported she was at home and provided the address. In the event of technical difficulties, Crystal Jackson shared a phone number she could be reached at. Crystal Jackson and this provider participated in today's telepsychological service. Also, Crystal Jackson denied anyone else being present in the room or on the WebEx appointment.  This provider conducted a brief check-in and verbally administered the PHQ-9 and GAD-7. Crystal Jackson shared, "It's been a hectic week." She shared she has pain in her knee and saw her doctor yesterday. Regarding eating, Crystal Jackson stated she is following the meal plan approximately 60% of the time. She acknowledged eating "comfort food" while she was experiencing pain. As such, psychoeducation regarding  triggers for emotional eating was provided. Crystal Jackson was provided a handout, and encouraged to utilize the handout between now and the next appointment to increase awareness of triggers and frequency. Crystal Jackson agreed. This provider also discussed behavioral strategies for specific triggers, such as placing the utensil down when conversing to avoid mindless eating. Crystal Jackson provided verbal consent during today's appointment for this provider to send the handout for triggers via e-mail. Crystal Jackson was receptive to today's session as evidenced by openness to sharing, responsiveness to feedback, and willingness to explore triggers for emotional eating.  Mental Status Examination:  Appearance: neat Behavior: cooperative Mood: euthymic Affect: mood congruent Speech: normal in rate, volume, and tone Eye Contact: appropriate Psychomotor Activity: appropriate Thought Process: linear, logical, and goal directed  Content/Perceptual Disturbances: denies suicidal and homicidal ideation, plan, and intent and no hallucinations, delusions, bizarre thinking or behavior reported or observed Orientation: time, person, place and purpose of appointment Cognition/Sensorium: memory, attention, language, and fund of knowledge intact  Insight: good Judgment: good  Structured Assessment Results: The Patient Health Questionnaire-9 (PHQ-9) is a self-report measure that assesses symptoms and severity of depression over the course of the last two weeks. Crystal Jackson obtained a score of 14 suggesting moderate depression. Crystal Jackson finds the endorsed symptoms to be somewhat difficult. Little interest or pleasure in doing things 0  Feeling down, depressed, or hopeless 1  Trouble falling or staying asleep, or sleeping too much 3  Feeling tired or having little energy 3  Poor appetite or overeating 3  Feeling bad about yourself --- or that you are a failure or have let yourself or your family down 2  Trouble concentrating on things, such  as reading the newspaper or watching television 0  Moving or speaking so slowly that other people could have noticed?  Or the opposite --- being so fidgety or restless that you have been moving around a lot more than usual 2  Thoughts that you would be better off dead or hurting yourself in some way 0  PHQ-9 Score 14    The Generalized Anxiety Disorder-7 (GAD-7) is a brief self-report measure that assesses symptoms of anxiety over the course of the last two weeks. Zoha obtained a score of 4 suggesting minimal anxiety. Arna finds the endorsed symptoms to be somewhat difficult. Feeling nervous, anxious, on edge 2  Not being able to stop or control worrying 0  Worrying too much about different things 2  Trouble relaxing 0  Being so restless that it's hard to sit still 0  Becoming easily annoyed or irritable 0  Feeling afraid as if something awful might happen 0  GAD-7 Score 4   Interventions:  Conducted a brief chart review Verbal administration of PHQ-9 and GAD-7 for symptom monitoring Provided empathic reflections and validation Reviewed content from the previous session Psychoeducation provided regarding triggers for emotional eating Focused on rapport building Employed supportive psychotherapy interventions to facilitate reduced distress, and to improve coping skills with identified stressors  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 Progress is limited, as Ashyra has just begun treatment with this provider; however, she is receptive to the interaction and interventions and rapport is being established.   Plan: Gretel continues to appear able and willing to participate as evidenced by engagement in reciprocal conversation, and asking questions for clarification as appropriate. The next  appointment will be scheduled in two weeks, which will be via [employer]. Once this provider's office resumes in-person appointments and it is deemed appropriate, Denika will be notified. The next session will focus on reviewing triggers for emotional eating and the introduction of pleasurable activities.

## 2018-07-30 ENCOUNTER — Encounter: Payer: Self-pay | Admitting: Physician Assistant

## 2018-07-30 ENCOUNTER — Other Ambulatory Visit: Payer: Self-pay

## 2018-07-30 ENCOUNTER — Ambulatory Visit (INDEPENDENT_AMBULATORY_CARE_PROVIDER_SITE_OTHER): Payer: Medicare Other | Admitting: Physician Assistant

## 2018-07-30 DIAGNOSIS — M1712 Unilateral primary osteoarthritis, left knee: Secondary | ICD-10-CM

## 2018-07-30 MED ORDER — METHYLPREDNISOLONE ACETATE 40 MG/ML IJ SUSP
40.0000 mg | INTRAMUSCULAR | Status: AC | PRN
  Administered 2018-07-30: 40 mg via INTRA_ARTICULAR

## 2018-07-30 MED ORDER — LIDOCAINE HCL 1 % IJ SOLN
3.0000 mL | INTRAMUSCULAR | Status: AC | PRN
  Administered 2018-07-30: 3 mL

## 2018-07-30 NOTE — Progress Notes (Signed)
   Procedure Note  [name]             Date of Birth: 1945/10/04           MRN: [id number]             Visit Date: 07/30/2018  HPI: Ms. Luciana Axe comes in today for left knee pain.   Last injection was 02/10/2018.  She has known end-stage arthritis of both knees.  She is had no new injury.  She is requesting an injection in the left knee today.  Review of systems: No fevers chills shortness of breath chest pain  Physical exam: Left knee good range of motion.  No instability valgus varus stressing.  Tenderness over the inferior pole patella and over the patellar tibial tendon.  Able to do straight leg raise on her own.  Slight tenderness over the medial joint line.  No abnormal warmth or erythema.  Procedures: Visit Diagnoses:  1. Primary osteoarthritis of left knee     Large Joint Inj: L knee on 07/30/2018 5:16 PM Indications: pain Details: 22 G 1.5 in needle, anterolateral approach  Arthrogram: No  Medications: 3 mL lidocaine 1 %; 40 mg methylPREDNISolone acetate 40 MG/ML Aspirate: 40 mL yellow Outcome: tolerated well, no immediate complications Procedure, treatment alternatives, risks and benefits explained, specific risks discussed. Consent was given by the patient. Immediately prior to procedure a time out was called to verify the correct patient, procedure, equipment, support staff and site/side marked as required. Patient was prepped and draped in the usual sterile fashion.    Plan: She will follow-up with [name] in mid October to discuss knee arthroplasty.  Questions were encouraged and answered at length.  She understands that she needs to wait at least 3 months between injections in the knees.

## 2018-07-31 ENCOUNTER — Ambulatory Visit (INDEPENDENT_AMBULATORY_CARE_PROVIDER_SITE_OTHER): Payer: Medicare Other | Admitting: Psychology

## 2018-07-31 DIAGNOSIS — F330 Major depressive disorder, recurrent, mild: Secondary | ICD-10-CM | POA: Diagnosis not present

## 2018-08-11 ENCOUNTER — Ambulatory Visit (INDEPENDENT_AMBULATORY_CARE_PROVIDER_SITE_OTHER): Payer: Medicare Other | Admitting: Family Medicine

## 2018-08-11 ENCOUNTER — Other Ambulatory Visit: Payer: Self-pay

## 2018-08-11 VITALS — BP 125/76 | HR 69 | Temp 97.7°F | Ht 64.0 in | Wt 240.0 lb

## 2018-08-11 DIAGNOSIS — R600 Localized edema: Secondary | ICD-10-CM

## 2018-08-11 DIAGNOSIS — Z6841 Body Mass Index (BMI) 40.0 and over, adult: Secondary | ICD-10-CM

## 2018-08-11 DIAGNOSIS — E559 Vitamin D deficiency, unspecified: Secondary | ICD-10-CM | POA: Diagnosis not present

## 2018-08-11 DIAGNOSIS — I10 Essential (primary) hypertension: Secondary | ICD-10-CM

## 2018-08-11 DIAGNOSIS — E66813 Obesity, class 3: Secondary | ICD-10-CM

## 2018-08-12 NOTE — Progress Notes (Signed)
Office: [phone]  /  Fax: [phone]   HPI:   Chief Complaint: OBESITY Crystal Jackson is here to discuss her progress with her obesity treatment plan. She is on the Category 2 plan + 100 calories and is following her eating plan approximately 50% of the time. She states she is exercising 0 minutes 0 times per week. Crystal Jackson states the first week she did really well following the plan but then really struggled the last week with eating secondary to back pain. Currently her back pain is 2-3 out of 10 if resting but increases with any movement. Her biggest obstacle is getting over her pain. Her weight is 240 lb (108.9 kg) today and has not lost weight since her last visit. She has lost 2 lbs since starting treatment with us.  Hypertension Neil CrouchRebecca Sonoda is a 73 y.o. female with hypertension. Neil CrouchRebecca Robenson denies chest pain, chest pressure, or headache. She is working weight loss to help control her blood pressure with the goal of decreasing her risk of heart attack and stroke. Mackenna's blood pressure is controlled.  Vitamin D deficiency Crystal Jackson has a diagnosis of Vitamin D deficiency. She is currently taking prescription Vit D and denies nausea, vomiting or muscle weakness but does report fatigue.  Lower Extremity Edema, Bilateral Crystal Jackson is still experiencing edema, though is improved.  ASSESSMENT AND PLAN:  Essential hypertension  Vitamin D deficiency  Bilateral lower extremity edema  Class 3 severe obesity with serious comorbidity and body mass index (BMI) of 40.0 to 44.9 in adult, unspecified obesity type (HCC)  PLAN:  Hypertension We discussed sodium restriction, working on healthy weight loss, and a regular exercise program as the means to achieve improved blood pressure control. Crystal Jackson agreed with this plan and agreed to follow up as directed. We will continue to monitor her blood pressure as well as her progress with the above lifestyle modifications. She will continue her  medications as prescribed and will watch for signs of hypotension as she continues her lifestyle modifications.  Vitamin D Deficiency Crystal Jackson was informed that low Vitamin D levels contributes to fatigue and are associated with obesity, breast, and colon cancer. She agrees to continue to take prescription Vit D (no refill needed) and will follow-up for routine testing of Vitamin D, at least 2-3 times per year. She was informed of the risk of over-replacement of Vitamin D and agrees to not increase her dose unless she discusses this with us first. Crystal Jackson agrees to follow-up with our clinic in 2 weeks.  Lower Extremity Edema, Bilateral Brandin's chlorthalidone was increased to 25 mg (1 pill) daily with 0 refills - she has prescription at home. She will have repeat CMP at her next appointment.  Obesity Crystal Jackson is currently in the action stage of change. As such, her goal is to continue with weight loss efforts. She has agreed to follow the Category 2 plan + 100 calories. Crystal Jackson has been instructed to work up to a goal of 150 minutes of combined cardio and strengthening exercise per week for weight loss and overall health benefits. We discussed the following Behavioral Modification Strategies today: increasing lean protein intake, increasing vegetables, work on meal planning and easy cooking plans, keeping healthy foods in the home, and planning for success.  Crystal Jackson has agreed to follow-up with our clinic in 2 weeks. She was informed of the importance of frequent follow-up visits to maximize her success with intensive lifestyle modifications for her multiple health conditions.  ALLERGIES: Allergies  Allergen Reactions  Dextromethorphan Hives and Shortness Of Breath   Guaifenesin & Derivatives Hives and Shortness Of Breath   Valtrex [Valacyclovir] Hives and Shortness Of Breath   Citalopram     Other reaction(s): Chills (intolerance)    Codeine Nausea And Vomiting   Duloxetine      Feel like in a fog Other reaction(s): Other (See Comments) Feel like in a fog    Avapro [Irbesartan] Rash   Penicillin G Rash   Vilazodone Nausea Only    MEDICATIONS: Current Outpatient Medications on File Prior to Visit  Medication Sig Dispense Refill   albuterol (PROAIR HFA) 108 (90 Base) MCG/ACT inhaler Inhale into the lungs.     ARNUITY ELLIPTA 200 MCG/ACT AEPB INHALE 1 PUFF INTO THE LUNGS DAILY 30 each 5   Ascorbic Acid (VITAMIN C) 100 MG tablet Take 100 mg by mouth daily.     Calcium Carbonate-Vitamin D (CALCIUM-VITAMIN D) 500-200 MG-UNIT tablet Take by mouth.     chlorpheniramine (CHLOR-TRIMETON) 4 MG tablet Take 1 tablet (4 mg total) by mouth every 6 (six) hours. 120 tablet 0   chlorthalidone (HYGROTON) 25 MG tablet Take 0.5 tablets (12.5 mg total) by mouth daily. (Patient taking differently: Take 25 mg by mouth daily. ) 30 tablet 0   Cholecalciferol (VITAMIN D-3) 25 MCG (1000 UT) CAPS Take by mouth.     clotrimazole-betamethasone (LOTRISONE) cream Qd for rash     diclofenac (VOLTAREN) 75 MG EC tablet TAKE 1 TABLET BY MOUTH TWICE DAILY WITH FOOD FOR 2 WEEKS THEN AS NEEDED AFTER THAT 180 tablet 0   dicyclomine (BENTYL) 20 MG tablet Take 20 mg by mouth 4 (four) times daily -  before meals and at bedtime.      doxazosin (CARDURA) 1 MG tablet Take 1 mg by mouth daily.     famotidine (PEPCID) 20 MG tablet Take 1 tablet (20 mg total) by mouth 2 (two) times daily. 14 tablet 0   fluticasone (FLONASE) 50 MCG/ACT nasal spray Place 2 sprays into both nostrils daily. 16 g 5   gabapentin (NEURONTIN) 100 MG capsule TK 1 TO 2 CS PO HS  11   metoprolol tartrate (LOPRESSOR) 25 MG tablet Take 25-50 mg by mouth 2 (two) times daily. 25 mg in the am and 50 mg at night     Multiple Vitamin (MULTIVITAMIN) tablet Take 1 tablet by mouth daily.      Omega-3 Fatty Acids (FISH OIL) 500 MG CAPS Take by mouth.     pantoprazole (PROTONIX) 40 MG tablet Take 40 mg by mouth daily.       potassium chloride (K-DUR) 10 MEQ tablet TAKE 2 TABLETS BY MOUTH TWICE DAILY     SUPER B COMPLEX/C PO Take by mouth.     Turmeric (QC TUMERIC COMPLEX PO) Take by mouth 2 (two) times a day.     venlafaxine XR (EFFEXOR-XR) 75 MG 24 hr capsule TK 3 CS PO D  3   No current facility-administered medications on file prior to visit.     PAST MEDICAL HISTORY: Past Medical History:  Diagnosis Date   Anxiety    Arthritis    Asthma    Back pain    Bilateral swelling of feet    Constipation    Depression    Gallbladder problem    GERD (gastroesophageal reflux disease)    History of low potassium    Hypertension    Joint pain    Obesity    Palpitations    Shortness of breath  Sleep apnea     PAST SURGICAL HISTORY: Past Surgical History:  Procedure Laterality Date   ABDOMINAL HYSTERECTOMY     APPENDECTOMY     CHOLECYSTECTOMY     KNEE SURGERY Left    SHOULDER SURGERY Right     SOCIAL HISTORY: Social History   Tobacco Use   Smoking status: Never Smoker   Smokeless tobacco: Never Used  Substance Use Topics   Alcohol use: No    Alcohol/week: 0.0 standard drinks   Drug use: No    FAMILY HISTORY: Family History  Problem Relation Age of Onset   Diabetes Mother    Heart disease Mother    Anxiety disorder Mother    Obesity Mother    Diabetes Father    Hypertension Father    Obesity Father    ROS: Review of Systems  Constitutional: Positive for malaise/fatigue.  Cardiovascular: Negative for chest pain.       Negative for chest pressure.  Gastrointestinal: Negative for nausea and vomiting.  Musculoskeletal:       Negative for muscle weakness.  Neurological: Negative for headaches.   PHYSICAL EXAM: Blood pressure 125/76, pulse 69, temperature 97.7 F (36.5 C), temperature source Oral, height 5\' 4"  (1.626 m), weight 240 lb (108.9 kg), SpO2 97 %. Body mass index is 41.2 kg/m. Physical Exam Vitals signs reviewed.  Constitutional:       Appearance: Normal appearance. She is obese.  Cardiovascular:     Rate and Rhythm: Normal rate.     Pulses: Normal pulses.  Pulmonary:     Effort: Pulmonary effort is normal.     Breath sounds: Normal breath sounds.  Musculoskeletal: Normal range of motion.  Skin:    General: Skin is warm and dry.  Neurological:     Mental Status: She is alert and oriented to person, place, and time.  Psychiatric:        Behavior: Behavior normal.   RECENT LABS AND TESTS: BMET    Component Value Date/Time   NA 136 07/09/2018 1609   K 4.4 07/09/2018 1609   CL 94 (L) 07/09/2018 1609   CO2 25 07/09/2018 1609   GLUCOSE 91 07/09/2018 1609   GLUCOSE 100 (H) 10/24/2015 1306   BUN 16 07/09/2018 1609   CREATININE 0.98 07/09/2018 1609   CALCIUM 9.7 07/09/2018 1609   GFRNONAA 58 (L) 07/09/2018 1609   GFRAA 67 07/09/2018 1609   Lab Results  Component Value Date   HGBA1C 5.8 (H) 07/09/2018   Lab Results  Component Value Date   INSULIN 25.0 (H) 07/09/2018   CBC    Component Value Date/Time   WBC 6.0 07/09/2018 1609   WBC 5.6 10/24/2015 1306   RBC 4.48 07/09/2018 1609   RBC 4.17 10/24/2015 1306   HGB 13.6 07/09/2018 1609   HCT 39.7 07/09/2018 1609   PLT 241 07/09/2018 1609   MCV 89 07/09/2018 1609   MCH 30.4 07/09/2018 1609   MCH 29.0 10/24/2015 1306   MCHC 34.3 07/09/2018 1609   MCHC 33.5 10/24/2015 1306   RDW 13.1 07/09/2018 1609   LYMPHSABS 2.0 07/09/2018 1609   MONOABS 0.4 10/24/2015 1306   EOSABS 0.2 07/09/2018 1609   BASOSABS 0.0 07/09/2018 1609   Iron/TIBC/Ferritin/ %Sat No results found for: IRON, TIBC, FERRITIN, IRONPCTSAT Lipid Panel     Component Value Date/Time   CHOL 158 07/09/2018 1609   TRIG 92 07/09/2018 1609   HDL 54 07/09/2018 1609   LDLCALC 86 07/09/2018 1609   Hepatic Function Panel  Component Value Date/Time   PROT 6.8 07/09/2018 1609   ALBUMIN 4.5 07/09/2018 1609   AST 23 07/09/2018 1609   ALT 25 07/09/2018 1609   ALKPHOS 46 07/09/2018 1609     BILITOT 0.3 07/09/2018 1609      Component Value Date/Time   TSH 2.210 07/09/2018 1609   Results for Stonerock, Windie "[name]" (MRN [id number]) as of 08/12/2018 10:47  Ref. Range 07/09/2018 16:09  Vitamin D, 25-Hydroxy Latest Ref Range: 30.0 - 100.0 ng/mL 58.0   OBESITY BEHAVIORAL INTERVENTION VISIT  Today's visit was #3  Starting weight: 242 lbs Starting date: 07/09/2018 Today's weight: 240 lbs  Today's date: 08/11/2018 Total lbs lost to date: 2 At least 15 minutes were spent on discussing the following behavioral intervention visit.    08/11/2018  Height 5\' 4"  (1.626 m)  Weight 240 lb (108.9 kg)  BMI (Calculated) 41.18  BLOOD PRESSURE - SYSTOLIC 125  BLOOD PRESSURE - DIASTOLIC 76   Body Fat % 48.9 %  Total Body Water (lbs) 85.8 lbs   ASK: We discussed the diagnosis of obesity with Neil Crouchebecca Bogue today and Crystal Jackson agreed to give us permission to discuss obesity behavioral modification therapy today.  ASSESS: Crystal Jackson has the diagnosis of obesity and her BMI today is 41.2. Crystal Jackson is in the action stage of change.   ADVISE: Crystal Jackson was educated on the multiple health risks of obesity as well as the benefit of weight loss to improve her health. She was advised of the need for long term treatment and the importance of lifestyle modifications to improve her current health and to decrease her risk of future health problems.  AGREE: Multiple dietary modification options and treatment options were discussed and  Crystal Jackson agreed to follow the recommendations documented in the above note.  ARRANGE: Crystal Jackson was educated on the importance of frequent visits to treat obesity as outlined per CMS and USPSTF guidelines and agreed to schedule her next follow up appointment today.  I, Marianna Paymentenise Haag, am acting as transcriptionist for Debbra RidingAlexandria Kadolph, MD   I have reviewed the above documentation for accuracy and completeness, and I agree with the above. - Debbra RidingAlexandria Kadolph, MD

## 2018-08-13 NOTE — Progress Notes (Signed)
Office: [phone]  /  Fax: [phone]    Date: August 18, 2018   Appointment Start Time: 10:30am Duration: 24 minutes Provider: Glennie Isle, Psy.D. Type of Session: Individual Therapy  Location of Patient: Home Location of Provider: [facility] Type of Contact: Telepsychological Visit via [employer] Web[employer]   Session Content: Crystal Jackson is a 73 y.o. female presenting via [place] for a follow-up appointment to address the previously established treatment goal of decreasing emotional eating. Today's appointment was a telepsychological visit, as this provider's clinic is seeing a limited number of patients for in-person visits due to COVID-19. Therapeutic services will resume to in-person appointments once deemed appropriate. Crystal Jackson expressed understanding regarding the rationale for telepsychological services, and provided verbal consent for today's appointment. Prior to proceeding with today's appointment, Crystal Jackson's physical location at the time of this appointment was obtained. Crystal Jackson reported she was at home and provided the address. In the event of technical difficulties, Crystal Jackson shared a phone number she could be reached at. Crystal Jackson and this provider participated in today's telepsychological service. Also, Crystal Jackson denied anyone else being present in the room or on the WebEx appointment.  This provider conducted a brief check-in and verbally administered the PHQ-9 and GAD-7. Tomeka shared, "I've actually been doing pretty good." However, she discussed experiencing pain in her back. Regarding triggers for emotional eating, Crystal Jackson shared she reviewed the handout. Regarding eating, Crystal Jackson shared she is concentrating on "things [she] can eat." She described feeling guilty when she deviates from the structured meal plan. She denied overeating, but acknowledged engaging in emotional eating a "couple times" secondary to fatigue and unpleasant emotions. As such, pleasurable  activities were introduced. Psychoeducation regarding pleasurable activities, including its impact on emotional eating and overall well-being was provided. Crystal Jackson was provided with a handout with various options of pleasurable activities, and was encouraged to engage in one activity a day and additional activities as needed when triggered to emotionally eat. Crystal Jackson agreed and noted, "I think it's great!" Crystal Jackson provided verbal consent during today's appointment for this provider to send the handout about pleasurable activities via e-mail. Due to ongoing sleeping difficulties, psychoeducation regarding sleep hygiene was provided. Crystal Jackson provided verbal consent during today's appointment for this provider to send the handout on sleep hygiene via e-mail. She added, "I like the plan. I'm so excited about our meetings." Overall, Crystal Jackson was receptive to today's session as evidenced by openness to sharing, responsiveness to feedback, and willingness to implement discussed strategies.  Mental Status Examination:  Appearance: neat Behavior: cooperative Mood: euthymic Affect: mood congruent Speech: normal in rate, volume, and tone Eye Contact: appropriate Psychomotor Activity: appropriate Thought Process: linear, logical, and goal directed  Content/Perceptual Disturbances: no hallucinations, delusions, bizarre thinking or behavior reported or observed and no evidence of suicidal and homicidal ideation, plan, and intent Orientation: time, person, place and purpose of appointment Cognition/Sensorium: memory, attention, language, and fund of knowledge intact  Insight: good Judgment: good  Structured Assessment Results: The Patient Health Questionnaire-9 (PHQ-9) is a self-report measure that assesses symptoms and severity of depression over the course of the last two weeks. Crystal Jackson obtained a score of 9 suggesting mild depression. Crystal Jackson finds the endorsed symptoms to be very difficult. Little interest or  pleasure in doing things 0  Feeling down, depressed, or hopeless 1  Trouble falling or staying asleep, or sleeping too much 3  Feeling tired or having little energy 3  Poor appetite or overeating 0  Feeling bad about yourself --- or  that you are a failure or have let yourself or your family down 1  Trouble concentrating on things, such as reading the newspaper or watching television 1  Moving or speaking so slowly that other people could have noticed? Or the opposite --- being so fidgety or restless that you have been moving around a lot more than usual 0  Thoughts that you would be better off dead or hurting yourself in some way 0  PHQ-9 Score 9    The Generalized Anxiety Disorder-7 (GAD-7) is a brief self-report measure that assesses symptoms of anxiety over the course of the last two weeks. Crystal Jackson obtained a score of 4 suggesting minimal anxiety. Crystal Jackson finds the endorsed symptoms to be somewhat difficult. Feeling nervous, anxious, on edge 2  Not being able to stop or control worrying 0  Worrying too much about different things 1  Trouble relaxing 0  Being so restless that it's hard to sit still 0  Becoming easily annoyed or irritable 1  Feeling afraid as if something awful might happen 0  GAD-7 Score 4   Interventions:  Conducted a brief chart review Verbal administration of PHQ-9 and GAD-7 for symptom monitoring Provided empathic reflections and validation Reviewed content from the previous session Psychoeducation provided regarding pleasurable activities Psychoeducation provided regarding sleep hygiene Employed supportive psychotherapy interventions to facilitate reduced distress, and to improve coping skills with identified stressors  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 Crystal Jackson  has demonstrated some progress in her goal as evidenced by increased awareness of hunger patterns and triggers for emotional eating.   Plan: Crystal Jackson continues to appear able and willing to participate as evidenced by engagement in reciprocal conversation, and asking questions for clarification as appropriate. The next appointment will be scheduled in three weeks, which will be via [employer] Web[employer]. The next session will focus on reviewing learned skills, and the introduction of mindfulness.

## 2018-08-18 ENCOUNTER — Ambulatory Visit (INDEPENDENT_AMBULATORY_CARE_PROVIDER_SITE_OTHER): Payer: Medicare Other | Admitting: Psychology

## 2018-08-18 ENCOUNTER — Other Ambulatory Visit: Payer: Self-pay

## 2018-08-18 DIAGNOSIS — F330 Major depressive disorder, recurrent, mild: Secondary | ICD-10-CM | POA: Diagnosis not present

## 2018-08-19 ENCOUNTER — Ambulatory Visit: Payer: Medicare Other | Admitting: Orthopaedic Surgery

## 2018-08-25 ENCOUNTER — Encounter (INDEPENDENT_AMBULATORY_CARE_PROVIDER_SITE_OTHER): Payer: Self-pay | Admitting: Family Medicine

## 2018-08-25 ENCOUNTER — Other Ambulatory Visit: Payer: Self-pay

## 2018-08-25 ENCOUNTER — Ambulatory Visit (INDEPENDENT_AMBULATORY_CARE_PROVIDER_SITE_OTHER): Payer: Medicare Other | Admitting: Family Medicine

## 2018-08-25 ENCOUNTER — Other Ambulatory Visit (INDEPENDENT_AMBULATORY_CARE_PROVIDER_SITE_OTHER): Payer: Self-pay | Admitting: Family Medicine

## 2018-08-25 VITALS — BP 129/76 | HR 71 | Temp 97.8°F | Ht 64.0 in | Wt 233.0 lb

## 2018-08-25 DIAGNOSIS — I10 Essential (primary) hypertension: Secondary | ICD-10-CM

## 2018-08-25 DIAGNOSIS — R7303 Prediabetes: Secondary | ICD-10-CM

## 2018-08-25 DIAGNOSIS — Z6841 Body Mass Index (BMI) 40.0 and over, adult: Secondary | ICD-10-CM

## 2018-08-25 MED ORDER — CHLORTHALIDONE 25 MG PO TABS: 25.0000 mg | ORAL_TABLET | Freq: Every day | ORAL | 1 refills | Status: DC

## 2018-08-26 LAB — COMPREHENSIVE METABOLIC PANEL
ALT: 30 IU/L (ref 0–32)
AST: 21 IU/L (ref 0–40)
Albumin/Globulin Ratio: 1.6 (ref 1.2–2.2)
Albumin: 4.3 g/dL (ref 3.7–4.7)
Alkaline Phosphatase: 47 IU/L (ref 39–117)
BUN/Creatinine Ratio: 24 (ref 12–28)
BUN: 26 mg/dL (ref 8–27)
Bilirubin Total: 0.2 mg/dL (ref 0.0–1.2)
CO2: 24 mmol/L (ref 20–29)
Calcium: 9.5 mg/dL (ref 8.7–10.3)
Chloride: 89 mmol/L — ABNORMAL LOW (ref 96–106)
Creatinine, Ser: 1.07 mg/dL — ABNORMAL HIGH (ref 0.57–1.00)
GFR calc Af Amer: 60 mL/min/{1.73_m2} (ref >59)
GFR calc non Af Amer: 52 mL/min/{1.73_m2} — ABNORMAL LOW (ref >59)
Globulin, Total: 2.7 g/dL (ref 1.5–4.5)
Glucose: 113 mg/dL — ABNORMAL HIGH (ref 65–99)
Potassium: 3.7 mmol/L (ref 3.5–5.2)
Sodium: 131 mmol/L — ABNORMAL LOW (ref 134–144)
Total Protein: 7 g/dL (ref 6.0–8.5)

## 2018-08-26 NOTE — Progress Notes (Signed)
Office: [phone]  /  Fax: [phone]   HPI:   Chief Complaint: OBESITY Crystal Jackson is here to discuss her progress with her obesity treatment plan. She is on the Category 2 plan and is following her eating plan approximately 80-85% of the time. She states she is walking/gardening 15-20 minutes 2 times per week. Crystal Jackson has been more on track the last few weeks. She did have an indulgence in pasta the other day and ate 1 cup of pasta with a side salad and lean ground beef. She denies being really hungry.  Her weight is 233 lb (105.7 kg) today and has had a weight loss of 7 pounds over a period of 2 weeks since her last visit. She has lost 9 lbs since starting treatment with us.  Pre-Diabetes Crystal Jackson has a diagnosis of prediabetes based on her elevated Hgb A1c and was informed this puts her at greater risk of developing diabetes. Last A1c was 5.8 on 07/09/2018 and insulin was 25.0. She is not taking metformin currently and continues to work on diet and exercise to decrease risk of diabetes. She notes occasional carb cravings - pasta.  Essential Hypertension Neil CrouchRebecca Mccorvey is a 73 y.o. female with hypertension and is taking chlorthalidone 25 mg. She is working weight loss to help control her blood pressure with the goal of decreasing her risk of heart attack and stroke. Rebeccas blood pressure is well controlled at 129/76.  ASSESSMENT AND PLAN:  Prediabetes  Essential hypertension - Plan: chlorthalidone (HYGROTON) 25 MG tablet, Comprehensive metabolic panel, CANCELED: Comprehensive metabolic panel  Class 3 severe obesity with serious comorbidity and body mass index (BMI) of 40.0 to 44.9 in adult, unspecified obesity type [facility](HCC)  PLAN:  Pre-Diabetes Crystal Jackson will continue to work on weight loss, exercise, and decreasing simple carbohydrates in her diet to help decrease the risk of diabetes. We dicussed metformin including benefits and risks. She was informed that eating too many simple  carbohydrates or too many calories at one sitting increases the likelihood of GI side effects. Crystal Jackson will have repeat labs mid to late October. She will follow-up with us as directed to monitor her progress.  Essential Hypertension We discussed sodium restriction, working on healthy weight loss, and a regular exercise program as the means to achieve improved blood pressure control. Crystal Jackson agreed with this plan and agreed to follow up as directed. We will continue to monitor her blood pressure as well as her progress with the above lifestyle modifications. Crystal Jackson was given a refill on her chlorthalidone 25 mg #30 with 1 refill and she agrees to follow-up with our clinic in 2 weeks.  She will have CMP today to check electrolytes and will watch for signs of hypotension as she continues her lifestyle modifications.  Obesity Crystal Jackson is currently in the action stage of change. As such, her goal is to continue with weight loss efforts. She has agreed to follow the Category 2 plan. Crystal Jackson has been instructed to work up to a goal of 150 minutes of combined cardio and strengthening exercise per week for weight loss and overall health benefits. We discussed the following Behavioral Modification Strategies today: increasing lean protein intake, increasing vegetables, work on meal planning and easy cooking plans, keeping healthy food in the home, better snacking choices, and planning for success.  Crystal Jackson has agreed to follow-up with our clinic in 2 weeks. She was informed of the importance of frequent follow-up visits to maximize her success with intensive lifestyle modifications for her multiple health  conditions.  ALLERGIES: Allergies  Allergen Reactions  . Dextromethorphan Hives and Shortness Of Breath  . Guaifenesin & Derivatives Hives and Shortness Of Breath  . Valtrex [Valacyclovir] Hives and Shortness Of Breath  . Citalopram     Other reaction(s): Chills (intolerance)   . Codeine Nausea And  Vomiting  . Duloxetine     Feel like in a fog Other reaction(s): Other (See Comments) Feel like in a fog   . Avapro [Irbesartan] Rash  . Penicillin G Rash  . Vilazodone Nausea Only    MEDICATIONS: Current Outpatient Medications on File Prior to Visit  Medication Sig Dispense Refill  . albuterol (PROAIR HFA) 108 (90 Base) MCG/ACT inhaler Inhale into the lungs.    . ARNUITY ELLIPTA 200 MCG/ACT AEPB INHALE 1 PUFF INTO THE LUNGS DAILY 30 each 5  . Ascorbic Acid (VITAMIN C) 100 MG tablet Take 100 mg by mouth daily.    . Calcium Carbonate-Vitamin D (CALCIUM-VITAMIN D) 500-200 MG-UNIT tablet Take by mouth.    . chlorpheniramine (CHLOR-TRIMETON) 4 MG tablet Take 1 tablet (4 mg total) by mouth every 6 (six) hours. 120 tablet 0  . Cholecalciferol (VITAMIN D-3) 25 MCG (1000 UT) CAPS Take by mouth.    . clotrimazole-betamethasone (LOTRISONE) cream Qd for rash    . diclofenac (VOLTAREN) 75 MG EC tablet TAKE 1 TABLET BY MOUTH TWICE DAILY WITH FOOD FOR 2 WEEKS THEN AS NEEDED AFTER THAT 180 tablet 0  . dicyclomine (BENTYL) 20 MG tablet Take 20 mg by mouth 4 (four) times daily -  before meals and at bedtime.     Marland Kitchen doxazosin (CARDURA) 1 MG tablet Take 1 mg by mouth daily.    . famotidine (PEPCID) 20 MG tablet Take 1 tablet (20 mg total) by mouth 2 (two) times daily. 14 tablet 0  . fluticasone (FLONASE) 50 MCG/ACT nasal spray Place 2 sprays into both nostrils daily. 16 g 5  . gabapentin (NEURONTIN) 100 MG capsule TK 1 TO 2 CS PO HS  11  . metoprolol tartrate (LOPRESSOR) 25 MG tablet Take 25-50 mg by mouth 2 (two) times daily. 25 mg in the am and 50 mg at night    . Multiple Vitamin (MULTIVITAMIN) tablet Take 1 tablet by mouth daily.     . Omega-3 Fatty Acids (FISH OIL) 500 MG CAPS Take by mouth.    . pantoprazole (PROTONIX) 40 MG tablet Take 40 mg by mouth daily.     . potassium chloride (K-DUR) 10 MEQ tablet TAKE 2 TABLETS BY MOUTH TWICE DAILY    . SUPER B COMPLEX/C PO Take by mouth.    . Turmeric (QC  TUMERIC COMPLEX PO) Take by mouth 2 (two) times a day.    Marland Kitchen VALACYCLOVIR HCL PO Take 500 mg by mouth 2 (two) times daily. For 3 days prn    . venlafaxine XR (EFFEXOR-XR) 75 MG 24 hr capsule TK 3 CS PO D  3   No current facility-administered medications on file prior to visit.     PAST MEDICAL HISTORY: Past Medical History:  Diagnosis Date  . Anxiety   . Arthritis   . Asthma   . Back pain   . Bilateral swelling of feet   . Constipation   . Depression   . Gallbladder problem   . GERD (gastroesophageal reflux disease)   . History of low potassium   . Hypertension   . Joint pain   . Obesity   . Palpitations   . Shortness of breath   .  Sleep apnea     PAST SURGICAL HISTORY: Past Surgical History:  Procedure Laterality Date  . ABDOMINAL HYSTERECTOMY    . APPENDECTOMY    . CHOLECYSTECTOMY    . KNEE SURGERY Left   . SHOULDER SURGERY Right     SOCIAL HISTORY: Social History   Tobacco Use  . Smoking status: Never Smoker  . Smokeless tobacco: Never Used  Substance Use Topics  . Alcohol use: No    Alcohol/week: 0.0 standard drinks  . Drug use: No    FAMILY HISTORY: Family History  Problem Relation Age of Onset  . Diabetes Mother   . Heart disease Mother   . Anxiety disorder Mother   . Obesity Mother   . Diabetes Father   . Hypertension Father   . Obesity Father    ROS: ROS none noted.  PHYSICAL EXAM: Blood pressure 129/76, pulse 71, temperature 97.8 F (36.6 C), temperature source Oral, height 5\' 4"  (1.626 m), weight 233 lb (105.7 kg), SpO2 95 %. Body mass index is 39.99 kg/m. Physical Exam Vitals signs reviewed.  Constitutional:      Appearance: Normal appearance. She is obese.  Cardiovascular:     Rate and Rhythm: Normal rate.     Pulses: Normal pulses.  Pulmonary:     Effort: Pulmonary effort is normal.     Breath sounds: Normal breath sounds.  Musculoskeletal: Normal range of motion.  Skin:    General: Skin is warm and dry.  Neurological:      Mental Status: She is alert and oriented to person, place, and time.  Psychiatric:        Behavior: Behavior normal.   RECENT LABS AND TESTS: BMET    Component Value Date/Time   NA 131 (L) 08/25/2018 1449   K 3.7 08/25/2018 1449   CL 89 (L) 08/25/2018 1449   CO2 24 08/25/2018 1449   GLUCOSE 113 (H) 08/25/2018 1449   GLUCOSE 100 (H) 10/24/2015 1306   BUN 26 08/25/2018 1449   CREATININE 1.07 (H) 08/25/2018 1449   CALCIUM 9.5 08/25/2018 1449   GFRNONAA 52 (L) 08/25/2018 1449   GFRAA 60 08/25/2018 1449   Lab Results  Component Value Date   HGBA1C 5.8 (H) 07/09/2018   Lab Results  Component Value Date   INSULIN 25.0 (H) 07/09/2018   CBC    Component Value Date/Time   WBC 6.0 07/09/2018 1609   WBC 5.6 10/24/2015 1306   RBC 4.48 07/09/2018 1609   RBC 4.17 10/24/2015 1306   HGB 13.6 07/09/2018 1609   HCT 39.7 07/09/2018 1609   PLT 241 07/09/2018 1609   MCV 89 07/09/2018 1609   MCH 30.4 07/09/2018 1609   MCH 29.0 10/24/2015 1306   MCHC 34.3 07/09/2018 1609   MCHC 33.5 10/24/2015 1306   RDW 13.1 07/09/2018 1609   LYMPHSABS 2.0 07/09/2018 1609   MONOABS 0.4 10/24/2015 1306   EOSABS 0.2 07/09/2018 1609   BASOSABS 0.0 07/09/2018 1609   Iron/TIBC/Ferritin/ %Sat No results found for: IRON, TIBC, FERRITIN, IRONPCTSAT Lipid Panel     Component Value Date/Time   CHOL 158 07/09/2018 1609   TRIG 92 07/09/2018 1609   HDL 54 07/09/2018 1609   LDLCALC 86 07/09/2018 1609   Hepatic Function Panel     Component Value Date/Time   PROT 7.0 08/25/2018 1449   ALBUMIN 4.3 08/25/2018 1449   AST 21 08/25/2018 1449   ALT 30 08/25/2018 1449   ALKPHOS 47 08/25/2018 1449   BILITOT 0.2 08/25/2018 1449  Component Value Date/Time   TSH 2.210 07/09/2018 1609   Results for Cabreja, Marilyne "[name]" (MRN [id number]) as of 08/26/2018 11:27  Ref. Range 07/09/2018 16:09  Vitamin D, 25-Hydroxy Latest Ref Range: 30.0 - 100.0 ng/mL 58.0   OBESITY BEHAVIORAL INTERVENTION VISIT  Today's  visit was #4  Starting weight: 242 lbs Starting date: 07/09/2018 Today's weight: 233 lbs Today's date: 08/25/2018 Total lbs lost to date: 9 At least 15 minutes were spent on discussing the following behavioral intervention visit.    08/25/2018  Height 5\' 4"  (1.626 m)  Weight 233 lb (105.7 kg)  BMI (Calculated) 39.97  BLOOD PRESSURE - SYSTOLIC 505  BLOOD PRESSURE - DIASTOLIC 76   Body Fat % 69.7 %  Total Body Water (lbs) 79.2 lbs   ASK: We discussed the diagnosis of obesity with Rochele Raring today and Aimie agreed to give [name] permission to discuss obesity behavioral modification therapy today.  ASSESS: Lalana has the diagnosis of obesity and her BMI today is 40.1. Ashrita is in the action stage of change.   ADVISE: Latiana was educated on the multiple health risks of obesity as well as the benefit of weight loss to improve her health. She was advised of the need for long term treatment and the importance of lifestyle modifications to improve her current health and to decrease her risk of future health problems.  AGREE: Multiple dietary modification options and treatment options were discussed and  Angeligue agreed to follow the recommendations documented in the above note.  ARRANGE: Annis was educated on the importance of frequent visits to treat obesity as outlined per CMS and USPSTF guidelines and agreed to schedule her next follow up appointment today.  I, Michaelene Song, am acting as transcriptionist for Ilene Qua, MD  I have reviewed the above documentation for accuracy and completeness, and I agree with the above. - Ilene Qua, MD

## 2018-09-03 ENCOUNTER — Encounter: Payer: Self-pay | Admitting: Pulmonary Disease

## 2018-09-03 ENCOUNTER — Other Ambulatory Visit: Payer: Self-pay

## 2018-09-03 ENCOUNTER — Ambulatory Visit: Payer: Medicare Other | Admitting: Pulmonary Disease

## 2018-09-03 VITALS — BP 128/70 | HR 70 | Temp 97.3°F | Ht 64.0 in | Wt 238.2 lb

## 2018-09-03 DIAGNOSIS — G4733 Obstructive sleep apnea (adult) (pediatric): Secondary | ICD-10-CM | POA: Insufficient documentation

## 2018-09-03 DIAGNOSIS — J4530 Mild persistent asthma, uncomplicated: Secondary | ICD-10-CM

## 2018-09-03 NOTE — Progress Notes (Signed)
Office: [phone]  /  Fax: [phone]    Date: September 09, 2018   Appointment Start Time: 2:07pm Duration: 24 minutes Provider: Lawerance CruelGaytri Malakye Nolden, Psy.D. Type of Session: Individual Therapy  Location of Patient: Home Location of Provider: Provider's Home Type of Contact: Audio Call  Session Content: Crystal Jackson is a 73 y.o. female presenting via a telepsychological follow-up appointment to address the previously established treatment goal of decreasing emotional eating. Of note, this provider called Crystal Jackson at 2:02pm as she did not present for the Crystal Jackson appointment. She indicated, "I lost my link." Thus, the e-mail with the secure link was re-sent. However, Crystal Jackson continued to have difficulty joining; therefore, the appointment proceeded as a regular telephone call. This provider explained only a land line to land line call is a secure form of connection for an audio call. Crystal Jackson acknowledged understanding and provided verbal consent to proceed with this provider using a cell phone and Crystal Jackson using a cell phone.  As such, today's appointment was initiated 7 minutes late. Today's appointment was a telepsychological visit, as this provider's clinic is seeing a limited number of patients for in-person visits due to COVID-19. Therapeutic services will resume to in-person appointments once deemed appropriate. Crystal Jackson expressed understanding regarding the rationale for telepsychological services, and provided verbal consent for today's appointment. Prior to proceeding with today's appointment, Crystal Jackson's physical location at the time of this appointment was obtained. Crystal Jackson reported she was at home and provided the address. In the event of technical difficulties, Crystal Jackson shared a phone number she could be reached at. Crystal Jackson and this provider participated in today's telepsychological service. Also, Crystal Jackson denied anyone else being present in the room or on the call.  This provider conducted a brief  check-in and verbally administered the PHQ-9 and GAD-7. Crystal Jackson shared her first cousin was diagnosed with liver cancer and passed away, which contributed to hopelessness. 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 She indicated her friend's husband also passed away recently. The aforementioned triggered her to emotionally eat, but she acknowledged she did not continue to deviate from the meal plan. Regarding sleep hygiene, Crystal Jackson reported attempting to implement strategies, but stated she continues to have sleeping concerns. This provider recommended she review the sleep hygiene and pleasurable activities handouts previously shared to assist with coping; she agreed. Psychoeducation regarding mindfulness was provided to assist with coping. A handout was provided to Crystal Jackson with further information regarding mindfulness, including exercises. This provider also explained the benefit of mindfulness as it relates to emotional eating. Crystal Jackson was encouraged to engage in the provided exercises between now and the next appointment with this provider. Crystal Jackson agreed. She was led through an exercise involving senses. Crystal Jackson noted, "That was just amazing. It is right on target." She was heard crying while sharing the aforementioned. Crystal Jackson provided verbal consent during today's appointment for this provider to send a handout regarding mindfulness via e-mail. Crystal Jackson was receptive to today's session as evidenced by openness to sharing, responsiveness to feedback, and willingness to engage in mindfulness exercises. At the end of today's appointment, Crystal Jackson was observed laughing and noted, "It has really been a tremendous help."  Mental Status Examination:  Appearance: unable to assess Behavior: unable to assess Mood: sad Affect: unable to fully assess Speech: normal in rate, volume, and tone Eye Contact: unable to assess Psychomotor Activity:  unable to assess Thought Process: linear, logical, and goal directed  Content/Perceptual Disturbances: no hallucinations,  delusions, bizarre thinking or behavior reported or observed and no evidence of suicidal and homicidal ideation, plan, and intent Orientation: time, person, place and purpose of appointment Cognition/Sensorium: memory, attention, language, and fund of knowledge intact  Insight: good Judgment: good  Structured Assessment Results: The Patient Health Questionnaire-9 (PHQ-9) is a self-report measure that assesses symptoms and severity of depression over the course of the last two weeks. Crystal Jackson obtained a score of 15 suggesting moderately severe depression. Crystal Jackson finds the endorsed symptoms to be somewhat difficult. Crystal Jackson interest or pleasure in doing things 2  Feeling down, depressed, or hopeless 2  Trouble falling or staying asleep, or sleeping too much 2  Feeling tired or having Crystal Jackson energy 2  Poor appetite or overeating 2  Feeling bad about yourself --- or that you are a failure or have let yourself or your family down 3  Trouble concentrating on things, such as reading the newspaper or watching television 2  Moving or speaking so slowly that other people could have noticed? Or the opposite --- being so fidgety or restless that you have been moving around a lot more than usual 0  Thoughts that you would be better off dead or hurting yourself in some way 0  PHQ-9 Score 15    The Generalized Anxiety Disorder-7 (GAD-7) is a brief self-report measure that assesses symptoms of anxiety over the course of the last two weeks. Crystal Jackson obtained a score of 12 suggesting moderate anxiety. Crystal Jackson finds the endorsed symptoms to be somewhat difficult. Feeling nervous, anxious, on edge 2  Not being able to stop or control worrying 2  Worrying too much about different things 2  Trouble relaxing 2  Being so restless that it's hard to sit still 0  Becoming easily annoyed or irritable  3  Feeling afraid as if something awful might happen- "What else is going to happen?" 1  GAD-7 Score 12   Interventions:  Conducted a brief chart review Verbal administration of PHQ-9 and GAD-7 for symptom monitoring Provided empathic reflections and validation Reviewed content from the previous session Processed thoughts and feelings Psychoeducation provided regarding mindfulness Engaged patient in a mindfulness exercise Employed supportive psychotherapy interventions to facilitate reduced distress, and to improve coping skills with identified stressors Employed acceptance and commitment interventions to emphasize mindfulness and acceptance without struggle  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 Joselin has demonstrated progress in her goal as evidenced by increased awareness of hunger patterns and triggers for emotional eating. She demonstrated willingness to engage in learned skills.   Plan: Maily continues to appear able and willing to participate as evidenced by engagement in reciprocal conversation, and asking questions for clarification as appropriate. The next appointment will be scheduled in two weeks, which will be via [employer]. The next session will focus further on mindfulness.

## 2018-09-03 NOTE — Progress Notes (Signed)
Crystal Jackson    [id number]    07-05-1945  Primary Care Physician:Pollock, Meda Coffee, MD  Referring Physician: Drake Leach, MD [address] [place],  [place] [address]  Chief complaint:   Follow up for Upper airway chronic cough,  Asthmatic bronchitis. GERD OSA on auto CPAP  HPI: Crystal Jackson is a 73 year old with past medical history of high blood pressure. She had been diagnosed with bronchitis as a child. She reports increasing frequency of attacks of the past few years. Her latest episode was in Nov 2017  Her symptoms start with sinusitis postnasal drip which then proceeds to cough with yellow mucus, wheezing, chest congestion. She does not report any fevers or chills. She has seasonal allergies, rhinitis, postnasal drip. She has significant issues with acid reflux and heartburn and is on Protonix 40 mg twice a day. She was assessed by GI and was told she has a small hiatal hernia.  Symptoms have stabilized on Arnuity inhaler.  In 2019 she stopped it temporarily and noted increasing cough, dyspnea.   Interim History: Symptoms are stable on Arnuity.  She hardly needs to use her rescue inhaler She is doing well on CPAP.  Uses it every day with improvement in daytime somnolence and snoring.  Trying to lose weight in preparation for knee replacement surgery.  Outpatient Encounter Medications as of 09/03/2018  Medication Sig  . albuterol (PROAIR HFA) 108 (90 Base) MCG/ACT inhaler Inhale into the lungs.  . ARNUITY ELLIPTA 200 MCG/ACT AEPB INHALE 1 PUFF INTO THE LUNGS DAILY  . Ascorbic Acid (VITAMIN C) 100 MG tablet Take 100 mg by mouth daily.  . Calcium Carbonate-Vitamin D (CALCIUM-VITAMIN D) 500-200 MG-UNIT tablet Take by mouth.  . chlorpheniramine (CHLOR-TRIMETON) 4 MG tablet Take 1 tablet (4 mg total) by mouth every 6 (six) hours.  . chlorthalidone (HYGROTON) 25 MG tablet Take 1 tablet (25 mg total) by mouth daily.  . Cholecalciferol (VITAMIN D-3) 25 MCG (1000 UT)  CAPS Take by mouth.  . clotrimazole-betamethasone (LOTRISONE) cream Qd for rash  . diclofenac (VOLTAREN) 75 MG EC tablet TAKE 1 TABLET BY MOUTH TWICE DAILY WITH FOOD FOR 2 WEEKS THEN AS NEEDED AFTER THAT  . dicyclomine (BENTYL) 20 MG tablet Take 20 mg by mouth 4 (four) times daily -  before meals and at bedtime.   Marland Kitchen doxazosin (CARDURA) 1 MG tablet Take 1 mg by mouth daily.  Marland Kitchen gabapentin (NEURONTIN) 100 MG capsule TK 1 TO 2 CS PO HS  . metoprolol tartrate (LOPRESSOR) 25 MG tablet Take 25-50 mg by mouth 2 (two) times daily. 25 mg in the am and 50 mg at night  . METRONIDAZOLE, TOPICAL, (METROLOTION) 0.75 % LOTN Apply topically.  . Multiple Vitamin (MULTIVITAMIN) tablet Take 1 tablet by mouth daily.   . Omega-3 Fatty Acids (FISH OIL) 500 MG CAPS Take by mouth.  . pantoprazole (PROTONIX) 40 MG tablet Take 40 mg by mouth daily.   . potassium chloride (K-DUR) 10 MEQ tablet TAKE 2 TABLETS BY MOUTH TWICE DAILY  . SUPER B COMPLEX/C PO Take by mouth.  Marland Kitchen VALACYCLOVIR HCL PO Take 500 mg by mouth 2 (two) times daily. For 3 days prn  . venlafaxine XR (EFFEXOR-XR) 75 MG 24 hr capsule TK 3 CS PO D  . [DISCONTINUED] fluticasone (FLONASE) 50 MCG/ACT nasal spray Place 2 sprays into both nostrils daily.  . [DISCONTINUED] famotidine (PEPCID) 20 MG tablet Take 1 tablet (20 mg total) by mouth 2 (two) times daily.  . [  DISCONTINUED] Turmeric (QC TUMERIC COMPLEX PO) Take by mouth 2 (two) times a day.   No facility-administered encounter medications on file as of 09/03/2018.    Physical Exam: Blood pressure 128/70, pulse 70, temperature (!) 97.3 F (36.3 C), height 5\' 4"  (1.626 m), weight 238 lb 3.2 oz (108 kg), SpO2 97 %. Gen:      No acute distress HEENT:  EOMI, sclera anicteric Neck:     No masses; no thyromegaly Lungs:    Clear to auscultation bilaterally; normal respiratory effort CV:         Regular rate and rhythm; no murmurs Abd:      + bowel sounds; soft, non-tender; no palpable masses, no distension Ext:     No edema; adequate peripheral perfusion Skin:      Warm and dry; no rash Neuro: alert and oriented x 3 Psych: normal mood and affect  Data Reviewed: PFTs 7/44/17 FVC 2.54 [83%], FEV1 1.9 to [83%), F/F 76, TLC 89%, DLCO 72%. Minimal obstructive lung disease with mild reduction in diffusion capacity that corrects for alveolar volume.  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 Low O2 sats of 71%.  CPAP download 11/12/15-12/11/15 AHI 0.3 Average daily use - 7 hrs, 41 minutes.  CXR 11/28/15- No acute cardiopulmonary disease. Images reviewed.  FENO 04/15/17-18  Assessment/Plan: Upper airway cough syndrome, Asthmatic bronchitis. Her PFTs show minimal obstruction. However there is marked reduction in mid flow rates indicating small airway disease with significant improvement post albuterol inhaler.   Symptoms are stable on steroid inhaler.  It appears that she needs to continue on this as she has worsening symptoms after she ran out of inhalers last year.   Use chlorphentermine and Flonase nasal spray as needed during periods of increased postnasal drip.  OSA Mild OSA on recent home sleep study.  Continues on AutoSet CPAP 5- 15 Download reviewed with good response and compliance.  GERD Continues on protonix  Plan: - Continue chlorpheniramine, flonase as needed - Continue arnuity inhaler, albuterol PRN -  Autoset CPAP. Work on weight loss.  Chilton GreathousePraveen Adrik Khim MD [facility] If no answer or after 3pm call: [phone] 09/03/2018, 11:26 AM  CC: Albertina SenegalPollock, Nelson, MD

## 2018-09-03 NOTE — Patient Instructions (Signed)
I am glad you are doing well with regard to your breathing Continue the Arnuity inhaler and CPAP Follow-up in 1 year.

## 2018-09-09 ENCOUNTER — Ambulatory Visit (INDEPENDENT_AMBULATORY_CARE_PROVIDER_SITE_OTHER): Payer: Medicare Other | Admitting: Psychology

## 2018-09-09 ENCOUNTER — Other Ambulatory Visit: Payer: Self-pay

## 2018-09-09 DIAGNOSIS — F330 Major depressive disorder, recurrent, mild: Secondary | ICD-10-CM

## 2018-09-10 ENCOUNTER — Encounter (INDEPENDENT_AMBULATORY_CARE_PROVIDER_SITE_OTHER): Payer: Self-pay | Admitting: Family Medicine

## 2018-09-10 ENCOUNTER — Other Ambulatory Visit: Payer: Self-pay

## 2018-09-10 ENCOUNTER — Ambulatory Visit (INDEPENDENT_AMBULATORY_CARE_PROVIDER_SITE_OTHER): Payer: Medicare Other | Admitting: Family Medicine

## 2018-09-10 DIAGNOSIS — I10 Essential (primary) hypertension: Secondary | ICD-10-CM | POA: Diagnosis not present

## 2018-09-10 DIAGNOSIS — J4540 Moderate persistent asthma, uncomplicated: Secondary | ICD-10-CM

## 2018-09-10 DIAGNOSIS — Z6841 Body Mass Index (BMI) 40.0 and over, adult: Secondary | ICD-10-CM

## 2018-09-11 NOTE — Progress Notes (Signed)
Office: [phone]  /  Fax: [phone] TeleHealth Visit:  Crystal CrouchRebecca Jackson has verbally consented to this TeleHealth visit today. The patient is located at home, the provider is located at the [facility] office. The participants in this visit include the listed provider and patient and any and all parties involved. The visit was conducted today via WebEx.  HPI:   Chief Complaint: OBESITY Crystal Jackson is here to discuss her progress with her obesity treatment plan. She is on the Category 2 plan and is following her eating plan approximately 50 % of the time. She states she is walking 2 times and physical therapy starts tomorrow. Crystal Jackson had a weight of 233 pounds today. Her first cousin died within the last few weeks and she has been emotionally eating more than normal. She does have consciousness when she is eating emotionally and she is often indulging in pizza and ice cream. She is ready to re-commit to the plan again. We were unable to weigh the patient today for this TeleHealth visit. She feels as if she has lost weight since her last visit. She has lost 9 lbs since starting treatment with us.  Hypertension Crystal CrouchRebecca Jackson is a 73 y.o. female with hypertension. Her blood pressure is well controlled at home (130/78, 122/74). She had labs done at the last appointment and her sodium was slightly down and chloride was down. Crystal CrouchRebecca Payment denies chest pain or shortness of breath on exertion. She is working weight loss to help control her blood pressure with the goal of decreasing her risk of heart attack and stroke.   Asthmatic Bronchitis Crystal Jackson has a diagnosis of asthmatic bronchitis and she just saw pulmonology between her last visit and today. She is stable and controlled on steroid inhaler.   ASSESSMENT AND PLAN:  No diagnosis found.  PLAN:  Hypertension We discussed sodium restriction, working on healthy weight loss, and a regular exercise program as the means to achieve  improved blood pressure control. Crystal Jackson agreed with this plan and agreed to follow up as directed. We will continue to monitor her blood pressure as well as her progress with the above lifestyle modifications. We will repeat CMP at the next appointment on 09/23/18 and she will decrease Chlorthalidone to 12.5 mg once daily. She will watch for signs of hypotension as she continues her lifestyle modifications.  Asthmatic Bronchitis Crystal Jackson will follow up in one year. She will follow up with our clinic in 2 weeks.  Obesity Crystal Jackson is currently in the action stage of change. As such, her goal is to continue with weight loss efforts She has agreed to follow the Category 2 plan Crystal Jackson has been instructed to work up to a goal of 150 minutes of combined cardio and strengthening exercise per week for weight loss and overall health benefits. We discussed the following Behavioral Modification Strategies today: planning for success, keeping healthy foods in the home, increasing lean protein intake, increasing vegetables and work on meal planning and easy cooking plans  Crystal Jackson has agreed to follow up with our clinic in 2 weeks. She was informed of the importance of frequent follow up visits to maximize her success with intensive lifestyle modifications for her multiple health conditions.  ALLERGIES: Allergies  Allergen Reactions  . Avapro [Irbesartan] Shortness Of Breath  . Dextromethorphan Hives and Shortness Of Breath  . Guaifenesin & Derivatives Hives and Shortness Of Breath  . Valtrex [Valacyclovir] Hives and Shortness Of Breath  . Citalopram     Other reaction(s):  Chills (intolerance)   . Codeine Nausea And Vomiting  . Duloxetine     Feel like in a fog Other reaction(s): Other (See Comments) Feel like in a fog   . Penicillin G Rash  . Vilazodone Nausea Only    MEDICATIONS: Current Outpatient Medications on File Prior to Visit  Medication Sig Dispense Refill  . albuterol (PROAIR HFA) 108  (90 Base) MCG/ACT inhaler Inhale into the lungs.    . ARNUITY ELLIPTA 200 MCG/ACT AEPB INHALE 1 PUFF INTO THE LUNGS DAILY 30 each 5  . Ascorbic Acid (VITAMIN C) 100 MG tablet Take 100 mg by mouth daily.    . Calcium Carbonate-Vitamin D (CALCIUM-VITAMIN D) 500-200 MG-UNIT tablet Take by mouth.    . chlorpheniramine (CHLOR-TRIMETON) 4 MG tablet Take 1 tablet (4 mg total) by mouth every 6 (six) hours. 120 tablet 0  . chlorthalidone (HYGROTON) 25 MG tablet Take 1 tablet (25 mg total) by mouth daily. 30 tablet 1  . Cholecalciferol (VITAMIN D-3) 25 MCG (1000 UT) CAPS Take by mouth.    . clotrimazole-betamethasone (LOTRISONE) cream Qd for rash    . diclofenac (VOLTAREN) 75 MG EC tablet TAKE 1 TABLET BY MOUTH TWICE DAILY WITH FOOD FOR 2 WEEKS THEN AS NEEDED AFTER THAT 180 tablet 0  . dicyclomine (BENTYL) 20 MG tablet Take 20 mg by mouth 4 (four) times daily -  before meals and at bedtime.     Marland Kitchen doxazosin (CARDURA) 1 MG tablet Take 1 mg by mouth daily.    Marland Kitchen gabapentin (NEURONTIN) 100 MG capsule TK 1 TO 2 CS PO HS  11  . metoprolol tartrate (LOPRESSOR) 25 MG tablet Take 25-50 mg by mouth 2 (two) times daily. 25 mg in the am and 50 mg at night    . METRONIDAZOLE, TOPICAL, (METROLOTION) 0.75 % LOTN Apply topically.    . Multiple Vitamin (MULTIVITAMIN) tablet Take 1 tablet by mouth daily.     . Omega-3 Fatty Acids (FISH OIL) 500 MG CAPS Take by mouth.    . pantoprazole (PROTONIX) 40 MG tablet Take 40 mg by mouth daily.     . potassium chloride (K-DUR) 10 MEQ tablet TAKE 2 TABLETS BY MOUTH TWICE DAILY    . SUPER B COMPLEX/C PO Take by mouth.    Marland Kitchen VALACYCLOVIR HCL PO Take 500 mg by mouth 2 (two) times daily. For 3 days prn    . venlafaxine XR (EFFEXOR-XR) 75 MG 24 hr capsule TK 3 CS PO D  3   No current facility-administered medications on file prior to visit.     PAST MEDICAL HISTORY: Past Medical History:  Diagnosis Date  . Anxiety   . Arthritis   . Asthma   . Back pain   . Bilateral swelling of  feet   . Constipation   . Depression   . Gallbladder problem   . GERD (gastroesophageal reflux disease)   . History of low potassium   . Hypertension   . Joint pain   . Obesity   . Palpitations   . Shortness of breath   . Sleep apnea     PAST SURGICAL HISTORY: Past Surgical History:  Procedure Laterality Date  . ABDOMINAL HYSTERECTOMY    . APPENDECTOMY    . CHOLECYSTECTOMY    . KNEE SURGERY Left   . SHOULDER SURGERY Right     SOCIAL HISTORY: Social History   Tobacco Use  . Smoking status: Never Smoker  . Smokeless tobacco: Never Used  Substance Use Topics  .  Alcohol use: No    Alcohol/week: 0.0 standard drinks  . Drug use: No    FAMILY HISTORY: Family History  Problem Relation Age of Onset  . Diabetes Mother   . Heart disease Mother   . Anxiety disorder Mother   . Obesity Mother   . Diabetes Father   . Hypertension Father   . Obesity Father     ROS: Review of Systems  Constitutional: Positive for weight loss.  Respiratory: Negative for shortness of breath (on exertion).   Cardiovascular: Negative for chest pain.    PHYSICAL EXAM: Pt in no acute distress  RECENT LABS AND TESTS: BMET    Component Value Date/Time   NA 131 (L) 08/25/2018 1449   K 3.7 08/25/2018 1449   CL 89 (L) 08/25/2018 1449   CO2 24 08/25/2018 1449   GLUCOSE 113 (H) 08/25/2018 1449   GLUCOSE 100 (H) 10/24/2015 1306   BUN 26 08/25/2018 1449   CREATININE 1.07 (H) 08/25/2018 1449   CALCIUM 9.5 08/25/2018 1449   GFRNONAA 52 (L) 08/25/2018 1449   GFRAA 60 08/25/2018 1449   Lab Results  Component Value Date   HGBA1C 5.8 (H) 07/09/2018   Lab Results  Component Value Date   INSULIN 25.0 (H) 07/09/2018   CBC    Component Value Date/Time   WBC 6.0 07/09/2018 1609   WBC 5.6 10/24/2015 1306   RBC 4.48 07/09/2018 1609   RBC 4.17 10/24/2015 1306   HGB 13.6 07/09/2018 1609   HCT 39.7 07/09/2018 1609   PLT 241 07/09/2018 1609   MCV 89 07/09/2018 1609   MCH 30.4 07/09/2018  1609   MCH 29.0 10/24/2015 1306   MCHC 34.3 07/09/2018 1609   MCHC 33.5 10/24/2015 1306   RDW 13.1 07/09/2018 1609   LYMPHSABS 2.0 07/09/2018 1609   MONOABS 0.4 10/24/2015 1306   EOSABS 0.2 07/09/2018 1609   BASOSABS 0.0 07/09/2018 1609   Iron/TIBC/Ferritin/ %Sat No results found for: IRON, TIBC, FERRITIN, IRONPCTSAT Lipid Panel     Component Value Date/Time   CHOL 158 07/09/2018 1609   TRIG 92 07/09/2018 1609   HDL 54 07/09/2018 1609   LDLCALC 86 07/09/2018 1609   Hepatic Function Panel     Component Value Date/Time   PROT 7.0 08/25/2018 1449   ALBUMIN 4.3 08/25/2018 1449   AST 21 08/25/2018 1449   ALT 30 08/25/2018 1449   ALKPHOS 47 08/25/2018 1449   BILITOT 0.2 08/25/2018 1449      Component Value Date/Time   TSH 2.210 07/09/2018 1609     Ref. Range 07/09/2018 16:09  Vitamin D, 25-Hydroxy Latest Ref Range: 30.0 - 100.0 ng/mL 58.0    I, Doreene Nest, am acting as Location manager for Eber Jones, MD  I have reviewed the above documentation for accuracy and completeness, and I agree with the above. - Ilene Qua, MD

## 2018-09-15 NOTE — Progress Notes (Signed)
Office: [phone]  /  Fax: [phone]    Date: September 24, 2018   Appointment Start Time: 11:30am Duration: 24 minutes Provider: Glennie Isle, Psy.D. Type of Session: Individual Therapy  Location of Patient: Home Location of Provider: Provider's Home Type of Contact: Telepsychological Visit via [employer] WebEx   Session Content: Crystal Jackson is a 73 y.o. female presenting via [employer] for a follow-up appointment to address the previously established treatment goal of decreasing emotional eating. Today's appointment was a telepsychological visit, as this provider's clinic is seeing a limited number of patients for in-person visits due to COVID-19. Therapeutic services will resume to in-person appointments once deemed appropriate. Crystal Jackson expressed understanding regarding the rationale for telepsychological services, and provided verbal consent for today's appointment. Prior to proceeding with today's appointment, Crystal Jackson's physical location at the time of this appointment was obtained. Crystal Jackson reported she was at home and provided the address. In the event of technical difficulties, Crystal Jackson shared a phone number she could be reached at. [place] and this provider participated in today's telepsychological service. Also, Crystal Jackson denied anyone else being present in the room or on the WebEx appointment.  This provider conducted a brief check-in and verbally administered the PHQ-9 and GAD-7. Crystal Jackson stated the last two weeks have been "okay." She discussed recently reviewing shared handouts and noted, "I've been trying to do the mindfulness and taking time for me." Crystal Jackson met with Dr. Adair Patter yesterday and she was switched to journal her meals. She indicated, "I've lost a total of 10 pounds." Since the last appointment with this provider, Crystal Jackson indicated emotional eating "has been better." Positive reinforcement was provided. Session then focused on mindfulness further. Psychoeducation regarding  formal (e.g., setting aside a specific time daily to engage in an exercise) and informal (e.g., cultivating awareness in the present moment and taking a non-judgmental approach while engaging in day-to-day tasks) mindfulness was provided. This provider also discussed utilizing the breath as an anchor and she was led through a brief breathing exercise. This provider also discussed the utilization of YouTube for mindfulness exercises (e.g., videos by Merri Ray). Furthermore, this provider discussed termination planning based on progress, including the option for a referral for longer-term therapeutic services. A follow-up appointment will be scheduled in 4 weeks and an additional follow-up/termination appointment will be scheduled in 4 weeks after that. Overall, Crystal Jackson was receptive to today's session as evidenced by openness to sharing, responsiveness to feedback, and willingness to continue engaging in mindfulness exercises.  Mental Status Examination:  Appearance: neat Behavior: cooperative Mood: euthymic Affect: mood congruent Speech: normal in rate, volume, and tone Eye Contact: appropriate Psychomotor Activity: appropriate Thought Process: linear, logical, and goal directed  Content/Perceptual Disturbances: no hallucinations, delusions, bizarre thinking or behavior reported or observed and no evidence of suicidal and homicidal ideation, plan, and intent Orientation: time, person, place and purpose of appointment Cognition/Sensorium: memory, attention, language, and fund of knowledge intact  Insight: good Judgment: good  Structured Assessment Results: The Patient Health Questionnaire-9 (PHQ-9) is a self-report measure that assesses symptoms and severity of depression over the course of the last two weeks. Crystal Jackson obtained a score of 3 suggesting minimal depression. Crystal Jackson finds the endorsed symptoms to be very difficult. Little interest or pleasure in doing things 0  Feeling down,  depressed, or hopeless 0  Trouble falling or staying asleep, or sleeping too much 0  Feeling tired or having little energy 3  Poor appetite or overeating 0  Feeling bad about yourself --- or that you are  a failure or have let yourself or your family down 0  Trouble concentrating on things, such as reading the newspaper or watching television 0  Moving or speaking so slowly that other people could have noticed? Or the opposite --- being so fidgety or restless that you have been moving around a lot more than usual 0  Thoughts that you would be better off dead or hurting yourself in some way 0  PHQ-9 Score 3    The Generalized Anxiety Disorder-7 (GAD-7) is a brief self-report measure that assesses symptoms of anxiety over the course of the last two weeks. Crystal Jackson obtained a score of 4 suggesting minimal anxiety. Crystal Jackson finds the endorsed symptoms to be somewhat difficult. Feeling nervous, anxious, on edge 0  Not being able to stop or control worrying 0  Worrying too much about different things 2  Trouble relaxing 0  Being so restless that it's hard to sit still 0  Becoming easily annoyed or irritable 1  Feeling afraid as if something awful might happen- current events 1  GAD-7 Score 4   Interventions:  Conducted a brief chart review Verbal administration of PHQ-9 and GAD-7 for symptom monitoring Provided empathic reflections and validation Reviewed content from the previous session Psychoeducation provided regarding mindfulness Engaged patient in a mindfulness exercise Discussed termination planning Provided positive reinforcement Employed supportive psychotherapy interventions to facilitate reduced distress, and to improve coping skills with identified stressors Employed acceptance and commitment interventions to emphasize mindfulness and acceptance without struggle  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 Crystal Jackson has demonstrated progress in her goal as evidenced by increased awareness of hunger patterns and triggers for emotional eating. Crystal Jackson also reported a reduction in emotional eating and demonstrates willingness to engage in mindfulness exercises.  Plan: Crystal Jackson continues to appear able and willing to participate as evidenced by engagement in reciprocal conversation, and asking questions for clarification as appropriate. The next appointment will be scheduled in one month, which will be via [employer]. The next session will focus on reviewing learned skills, and working towards the established treatment goal.

## 2018-09-17 ENCOUNTER — Ambulatory Visit: Payer: Medicare Other | Admitting: Pulmonary Disease

## 2018-09-17 ENCOUNTER — Ambulatory Visit: Payer: Medicare Other

## 2018-09-23 ENCOUNTER — Encounter (INDEPENDENT_AMBULATORY_CARE_PROVIDER_SITE_OTHER): Payer: Self-pay | Admitting: Family Medicine

## 2018-09-23 ENCOUNTER — Other Ambulatory Visit: Payer: Self-pay

## 2018-09-23 ENCOUNTER — Ambulatory Visit (INDEPENDENT_AMBULATORY_CARE_PROVIDER_SITE_OTHER): Payer: Medicare Other | Admitting: Family Medicine

## 2018-09-23 VITALS — BP 125/78 | HR 78 | Temp 98.0°F | Ht 64.0 in | Wt 232.0 lb

## 2018-09-23 DIAGNOSIS — I10 Essential (primary) hypertension: Secondary | ICD-10-CM | POA: Diagnosis not present

## 2018-09-23 DIAGNOSIS — Z6839 Body mass index (BMI) 39.0-39.9, adult: Secondary | ICD-10-CM | POA: Diagnosis not present

## 2018-09-23 DIAGNOSIS — R7303 Prediabetes: Secondary | ICD-10-CM

## 2018-09-24 ENCOUNTER — Ambulatory Visit (INDEPENDENT_AMBULATORY_CARE_PROVIDER_SITE_OTHER): Payer: Medicare Other | Admitting: Psychology

## 2018-09-24 DIAGNOSIS — F330 Major depressive disorder, recurrent, mild: Secondary | ICD-10-CM

## 2018-09-25 NOTE — Progress Notes (Signed)
Office: [phone]  /  Fax: [phone]   HPI:   Chief Complaint: OBESITY Crystal Jackson is here to discuss her progress with her obesity treatment plan. She is on the Category 2 plan and is following her eating plan approximately 80 % of the time. She states she is physical therapy exercises for 20 minutes 3-4 times per week. Crystal Jackson voices her back and knee have been much more painful and so she had to change physical therapy exercises, secondary to back pain. She is eating off the plan occasionally, and is eating a slice of pizza and potatoes when her husband wants these.  Her weight is 232 lb (105.2 kg) today and has had a weight loss of 1 pound over a period of 4 weeks since her last visit. She has lost 10 lbs since starting treatment with [name].  Pre-Diabetes Crystal Jackson has a diagnosis of pre-diabetes based on her elevated Hgb A1c and was informed this puts her at greater risk of developing diabetes. Last Hgb A1c was of 5.8 and insulin of 25.0. She is not taking metformin currently and continues to work on diet and exercise to decrease risk of diabetes. She denies hypoglycemia.  Hypertension Crystal Jackson is a 73 y.o. female with hypertension. Crystal Jackson's blood pressure is controlled today. She denies chest pain, chest pressure, or headaches. She is working on weight loss to help control her blood pressure with the goal of decreasing her risk of heart attack and stroke.   ASSESSMENT AND PLAN:  Prediabetes  Essential hypertension  Class 2 severe obesity with serious comorbidity and body mass index (BMI) of 39.0 to 39.9 in adult, unspecified obesity type Crystal Jackson)  PLAN:  Pre-Diabetes Annasofia will continue to work on weight loss, exercise, and decreasing simple carbohydrates in her diet to help decrease the risk of diabetes. We dicussed metformin including benefits and risks. She was informed that eating too many simple carbohydrates or too many calories at one sitting increases the likelihood of  GI side effects. We will repeat labs in mid October. Crystal Jackson agrees to follow up with our clinic in 2 weeks as directed to monitor her progress.  Hypertension We discussed sodium restriction, working on healthy weight loss, and a regular exercise program as the means to achieve improved blood pressure control. Crystal Jackson agreed with this plan and agreed to follow up as directed. We will continue to monitor her blood pressure as well as her progress with the above lifestyle modifications. Crystal Jackson agrees to continue taking chlorthalidone at 12.5 mg and will watch for signs of hypotension as she continues her lifestyle modifications. Crystal Jackson agrees to follow up with our clinic in 2 weeks.  I spent > than 50% of the 15 minute visit on counseling as documented in the note.  Obesity Crystal Jackson is currently in the action stage of change. As such, her goal is to continue with weight loss efforts She has agreed to keep a food journal with 400-500 calories and 35+ grams of protein at supper daily and follow the Category 2 plan Crystal Jackson has been instructed to work up to a goal of 150 minutes of combined cardio and strengthening exercise per week for weight loss and overall health benefits. We discussed the following Behavioral Modification Strategies today: increasing lean protein intake, increasing vegetables and work on meal planning and easy cooking plans, keeping healthy foods in the home, better snacking choices, and planning for success   Crystal Jackson has agreed to follow up with our clinic in 2 weeks. She was informed  of the importance of frequent follow up visits to maximize her success with intensive lifestyle modifications for her multiple health conditions.  ALLERGIES: Allergies  Allergen Reactions   Avapro [Irbesartan] Shortness Of Breath   Dextromethorphan Hives and Shortness Of Breath   Guaifenesin & Derivatives Hives and Shortness Of Breath   Valtrex [Valacyclovir] Hives and Shortness Of Breath     Citalopram     Other reaction(s): Chills (intolerance)    Codeine Nausea And Vomiting   Duloxetine     Feel like in a fog Other reaction(s): Other (See Comments) Feel like in a fog    Penicillin G Rash   Vilazodone Nausea Only    MEDICATIONS: Current Outpatient Medications on File Prior to Visit  Medication Sig Dispense Refill   albuterol (PROAIR HFA) 108 (90 Base) MCG/ACT inhaler Inhale into the lungs.     ARNUITY ELLIPTA 200 MCG/ACT AEPB INHALE 1 PUFF INTO THE LUNGS DAILY 30 each 5   Ascorbic Acid (VITAMIN C) 100 MG tablet Take 100 mg by mouth daily.     Calcium Carbonate-Vitamin D (CALCIUM-VITAMIN D) 500-200 MG-UNIT tablet Take by mouth.     chlorpheniramine (CHLOR-TRIMETON) 4 MG tablet Take 1 tablet (4 mg total) by mouth every 6 (six) hours. 120 tablet 0   chlorthalidone (HYGROTON) 25 MG tablet Take 1 tablet (25 mg total) by mouth daily. 30 tablet 1   Cholecalciferol (VITAMIN D-3) 25 MCG (1000 UT) CAPS Take by mouth.     clotrimazole-betamethasone (LOTRISONE) cream Qd for rash     diclofenac (VOLTAREN) 75 MG EC tablet TAKE 1 TABLET BY MOUTH TWICE DAILY WITH FOOD FOR 2 WEEKS THEN AS NEEDED AFTER THAT 180 tablet 0   dicyclomine (BENTYL) 20 MG tablet Take 20 mg by mouth 4 (four) times daily -  before meals and at bedtime.      doxazosin (CARDURA) 1 MG tablet Take 1 mg by mouth daily.     gabapentin (NEURONTIN) 100 MG capsule TK 1 TO 2 CS PO HS  11   metoprolol tartrate (LOPRESSOR) 25 MG tablet Take 25-50 mg by mouth 2 (two) times daily. 25 mg in the am and 50 mg at night     METRONIDAZOLE, TOPICAL, (METROLOTION) 0.75 % LOTN Apply topically.     Multiple Vitamin (MULTIVITAMIN) tablet Take 1 tablet by mouth daily.      Omega-3 Fatty Acids (FISH OIL) 500 MG CAPS Take by mouth.     pantoprazole (PROTONIX) 40 MG tablet Take 40 mg by mouth daily.      potassium chloride (K-DUR) 10 MEQ tablet TAKE 2 TABLETS BY MOUTH TWICE DAILY     SUPER B COMPLEX/C PO Take by  mouth.     VALACYCLOVIR HCL PO Take 500 mg by mouth 2 (two) times daily. For 3 days prn     venlafaxine XR (EFFEXOR-XR) 75 MG 24 hr capsule TK 3 CS PO D  3   No current facility-administered medications on file prior to visit.     PAST MEDICAL HISTORY: Past Medical History:  Diagnosis Date   Anxiety    Arthritis    Asthma    Back pain    Bilateral swelling of feet    Constipation    Depression    Gallbladder problem    GERD (gastroesophageal reflux disease)    History of low potassium    Hypertension    Joint pain    Obesity    Palpitations    Shortness of breath  Sleep apnea     PAST SURGICAL HISTORY: Past Surgical History:  Procedure Laterality Date   ABDOMINAL HYSTERECTOMY     APPENDECTOMY     CHOLECYSTECTOMY     KNEE SURGERY Left    SHOULDER SURGERY Right     SOCIAL HISTORY: Social History   Tobacco Use   Smoking status: Never Smoker   Smokeless tobacco: Never Used  Substance Use Topics   Alcohol use: No    Alcohol/week: 0.0 standard drinks   Drug use: No    FAMILY HISTORY: Family History  Problem Relation Age of Onset   Diabetes Mother    Heart disease Mother    Anxiety disorder Mother    Obesity Mother    Diabetes Father    Hypertension Father    Obesity Father     ROS: Review of Systems  Constitutional: Positive for weight loss.  Cardiovascular: Negative for chest pain.       Negative chest pressure  Musculoskeletal: Positive for back pain.  Neurological: Negative for headaches.  Endo/Heme/Allergies:       Negative hypoglycemia    PHYSICAL EXAM: Blood pressure 125/78, pulse 78, temperature 98 F (36.7 C), temperature source Oral, height 5\' 4"  (1.626 m), weight 232 lb (105.2 kg), SpO2 96 %. Body mass index is 39.82 kg/m. Physical Exam Vitals signs reviewed.  Constitutional:      Appearance: Normal appearance. She is obese.  Cardiovascular:     Rate and Rhythm: Normal rate.     Pulses: Normal  pulses.  Pulmonary:     Effort: Pulmonary effort is normal.     Breath sounds: Normal breath sounds.  Musculoskeletal: Normal range of motion.  Skin:    General: Skin is warm and dry.  Neurological:     Mental Status: She is alert and oriented to person, place, and time.  Psychiatric:        Mood and Affect: Mood normal.        Behavior: Behavior normal.     RECENT LABS AND TESTS: BMET    Component Value Date/Time   NA 131 (L) 08/25/2018 1449   K 3.7 08/25/2018 1449   CL 89 (L) 08/25/2018 1449   CO2 24 08/25/2018 1449   GLUCOSE 113 (H) 08/25/2018 1449   GLUCOSE 100 (H) 10/24/2015 1306   BUN 26 08/25/2018 1449   CREATININE 1.07 (H) 08/25/2018 1449   CALCIUM 9.5 08/25/2018 1449   GFRNONAA 52 (L) 08/25/2018 1449   GFRAA 60 08/25/2018 1449   Lab Results  Component Value Date   HGBA1C 5.8 (H) 07/09/2018   Lab Results  Component Value Date   INSULIN 25.0 (H) 07/09/2018   CBC    Component Value Date/Time   WBC 6.0 07/09/2018 1609   WBC 5.6 10/24/2015 1306   RBC 4.48 07/09/2018 1609   RBC 4.17 10/24/2015 1306   HGB 13.6 07/09/2018 1609   HCT 39.7 07/09/2018 1609   PLT 241 07/09/2018 1609   MCV 89 07/09/2018 1609   MCH 30.4 07/09/2018 1609   MCH 29.0 10/24/2015 1306   MCHC 34.3 07/09/2018 1609   MCHC 33.5 10/24/2015 1306   RDW 13.1 07/09/2018 1609   LYMPHSABS 2.0 07/09/2018 1609   MONOABS 0.4 10/24/2015 1306   EOSABS 0.2 07/09/2018 1609   BASOSABS 0.0 07/09/2018 1609   Iron/TIBC/Ferritin/ %Sat No results found for: IRON, TIBC, FERRITIN, IRONPCTSAT Lipid Panel     Component Value Date/Time   CHOL 158 07/09/2018 1609   TRIG 92 07/09/2018 1609  HDL 54 07/09/2018 1609   LDLCALC 86 07/09/2018 1609   Hepatic Function Panel     Component Value Date/Time   PROT 7.0 08/25/2018 1449   ALBUMIN 4.3 08/25/2018 1449   AST 21 08/25/2018 1449   ALT 30 08/25/2018 1449   ALKPHOS 47 08/25/2018 1449   BILITOT 0.2 08/25/2018 1449      Component Value Date/Time    TSH 2.210 07/09/2018 1609      OBESITY BEHAVIORAL INTERVENTION VISIT  Today's visit was # 6   Starting weight: 242 lbs Starting date: 07/09/2018 Today's weight : 232 lbs Today's date: 09/23/2018 Total lbs lost to date: 10    ASK: We discussed the diagnosis of obesity with Crystal Jackson today and Crystal Jackson agreed to give [name] permission to discuss obesity behavioral modification therapy today.  ASSESS: Crystal Jackson has the diagnosis of obesity and her BMI today is 39.8 Crystal Jackson is in the action stage of change   ADVISE: Crystal Jackson was educated on the multiple health risks of obesity as well as the benefit of weight loss to improve her health. She was advised of the need for long term treatment and the importance of lifestyle modifications to improve her current health and to decrease her risk of future health problems.  AGREE: Multiple dietary modification options and treatment options were discussed and  Crystal Jackson agreed to follow the recommendations documented in the above note.  ARRANGE: Crystal Jackson was educated on the importance of frequent visits to treat obesity as outlined per CMS and USPSTF guidelines and agreed to schedule her next follow up appointment today.  I, Trixie Dredge, am acting as transcriptionist for Ilene Qua, MD  I have reviewed the above documentation for accuracy and completeness, and I agree with the above. - Ilene Qua, MD

## 2018-10-01 ENCOUNTER — Ambulatory Visit (INDEPENDENT_AMBULATORY_CARE_PROVIDER_SITE_OTHER): Payer: Medicare Other | Admitting: Physician Assistant

## 2018-10-01 ENCOUNTER — Ambulatory Visit: Payer: Self-pay

## 2018-10-01 ENCOUNTER — Other Ambulatory Visit: Payer: Self-pay

## 2018-10-01 ENCOUNTER — Encounter: Payer: Self-pay | Admitting: Physician Assistant

## 2018-10-01 DIAGNOSIS — M545 Low back pain, unspecified: Secondary | ICD-10-CM

## 2018-10-01 DIAGNOSIS — M4807 Spinal stenosis, lumbosacral region: Secondary | ICD-10-CM

## 2018-10-01 NOTE — Progress Notes (Signed)
Office Visit Note   [name]           Date of Birth: 1945-05-16           MRN: [id number] Visit Date: 10/01/2018              Requested by: Albertina Senegal, MD [address] [place],  [place] [address] PCP: Albertina Senegal, MD   Assessment & Plan: Visit Diagnoses:  1. Left low back pain, unspecified chronicity, unspecified whether sciatica present     Plan: We will obtain an MRI of her lumbar spine for epidural steroid/facet injections.  Have her follow-up after the MRI to go over results and discuss further treatment.  Questions encouraged and answered at length today.  Follow-Up Instructions: Return After MRI.   Orders:  Orders Placed This Encounter  Procedures  . XR Lumbar Spine 2-3 Views   No orders of the defined types were placed in this encounter.     Procedures: No procedures performed   Clinical Data: No additional findings.   Subjective: Chief Complaint  Patient presents with  . Lower Back - Pain    HPI Crystal Jackson comes in today with complaint of low back pain.  Pain is only present whenever she is standing.  Is relieved by leaning over an object or resting.  She has no waking pain.  No bowel bladder dysfunction.  No saddle anesthesia like symptoms.  No radicular symptoms down either leg.  No known injury to her back.  Reports she saw her primary care physician who obtained x-rays which were not available today and were done through [facility] system.  She was placed on Medrol Dosepak sent to physical therapy for a month has had no relief in the pain.  Review of Systems Denies any fevers chills shortness breath chest pain.  Objective: Vital Signs: There were no vitals taken for this visit.  Physical Exam General: Well-developed well-nourished female no acute distress. Psych: Alert and oriented x3. Respiratory: Normal respiratory effort. Ortho Exam 5 out of 5 strength against resistance throughout lower extremities.   Negative straight leg raise bilaterally.  She has full forward flexion slightly limited extension.  Specialty Comments:  No specialty comments available.  Imaging: Xr Lumbar Spine 2-3 Views  Result Date: 10/01/2018 AP lateral views lumbar spine: No acute fracture.  Mild endplate spurring at several levels.  No acute fractures.  The space overall well-maintained.  Very slight anterior spondylolisthesis at L4-5.  Facet joint arthritic changes particularly the lower lumbar spine.    PMFS History: Patient Active Problem List   Diagnosis Date Noted  . OSA (obstructive sleep apnea) 09/03/2018  . Mild persistent asthma without complication 09/03/2018  . Severe obesity (BMI >= 40) (HCC) 04/14/2018  . Unilateral primary osteoarthritis, left knee 04/14/2018   Past Medical History:  Diagnosis Date  . Anxiety   . Arthritis   . Asthma   . Back pain   . Bilateral swelling of feet   . Constipation   . Depression   . Gallbladder problem   . GERD (gastroesophageal reflux disease)   . History of low potassium   . Hypertension   . Joint pain   . Obesity   . Palpitations   . Shortness of breath   . Sleep apnea     Family History  Problem Relation Age of Onset  . Diabetes Mother   . Heart disease Mother   . Anxiety disorder Mother   . Obesity Mother   .  Diabetes Father   . Hypertension Father   . Obesity Father     Past Surgical History:  Procedure Laterality Date  . ABDOMINAL HYSTERECTOMY    . APPENDECTOMY    . CHOLECYSTECTOMY    . KNEE SURGERY Left   . SHOULDER SURGERY Right    Social History   Occupational History  . Not on file  Tobacco Use  . Smoking status: Never Smoker  . Smokeless tobacco: Never Used  Substance and Sexual Activity  . Alcohol use: No    Alcohol/week: 0.0 standard drinks  . Drug use: No  . Sexual activity: Not on file

## 2018-10-06 ENCOUNTER — Other Ambulatory Visit: Payer: Self-pay

## 2018-10-06 ENCOUNTER — Ambulatory Visit (INDEPENDENT_AMBULATORY_CARE_PROVIDER_SITE_OTHER): Payer: Medicare Other | Admitting: Family Medicine

## 2018-10-06 VITALS — BP 115/67 | HR 69 | Temp 97.7°F | Ht 64.0 in | Wt 230.0 lb

## 2018-10-06 DIAGNOSIS — R7303 Prediabetes: Secondary | ICD-10-CM

## 2018-10-06 DIAGNOSIS — Z6839 Body mass index (BMI) 39.0-39.9, adult: Secondary | ICD-10-CM

## 2018-10-06 DIAGNOSIS — I10 Essential (primary) hypertension: Secondary | ICD-10-CM

## 2018-10-06 MED ORDER — CHLORTHALIDONE 25 MG PO TABS: 12.5000 mg | ORAL_TABLET | Freq: Every day | ORAL | 0 refills | Status: DC

## 2018-10-07 NOTE — Progress Notes (Signed)
Office: [phone]  /  Fax: [phone]   HPI:   Chief Complaint: OBESITY Crystal Jackson is here to discuss her progress with her obesity treatment plan. She is on the keep a food journal with 400-500 calories and 35+ grams of protein at supper daily and follow the Category 2 plan and is following her eating plan approximately 80 % of the time. She states she is physical therapy for 15 minutes 3-4 times per week. Crystal Jackson is trying hard to stay on track and is currently awaiting further work up of back pain. She has done some journaling but was a little confused as to how to journal.  Her weight is 230 lb (104.3 kg) today and has had a weight loss of 2 pounds over a period of 2 weeks since her last visit. She has lost 12 lbs since starting treatment with [name].  Hypertension Crystal Jackson is a 73 y.o. female with hypertension. Crystal Jackson blood pressure is controlled. She denies chest pain, chest pressure, or headaches. She is working on weight loss to help control her blood pressure with the goal of decreasing her risk of heart attack and stroke.   Pre-Diabetes Crystal Jackson has a diagnosis of pre-diabetes based on her elevated Hgb A1c and was informed this puts her at greater risk of developing diabetes. Last Hgb A1c was of 5.8. She is not taking metformin currently and continues to work on diet and exercise to decrease risk of diabetes. She denies nausea or hypoglycemia.  ASSESSMENT AND PLAN:  Essential hypertension - Plan: chlorthalidone (HYGROTON) 25 MG tablet  Prediabetes  Class 2 severe obesity with serious comorbidity and body mass index (BMI) of 39.0 to 39.9 in adult, unspecified obesity type (Crystal Jackson)  PLAN:  Hypertension We discussed sodium restriction, working on healthy weight loss, and a regular exercise program as the means to achieve improved blood pressure control. Crystal Jackson agreed with this plan and agreed to follow up as directed. We will continue to monitor her blood pressure as well as  her progress with the above lifestyle modifications. Crystal Jackson agrees to continue taking chlorthalidone 12.5 mg PO daily #30 and we will refill for 1 month. She will watch for signs of hypotension as she continues her lifestyle modifications. Crystal Jackson agrees to follow up with our clinic in 2 weeks.  Pre-Diabetes Crystal Jackson will continue to work on weight loss, exercise, and decreasing simple carbohydrates in her diet to help decrease the risk of diabetes. We dicussed metformin including benefits and risks. She was informed that eating too many simple carbohydrates or too many calories at one sitting increases the likelihood of GI side effects. We will repeat labs after Thanksgiving. Crystal Jackson agrees to follow up with [name] as directed to monitor her progress.  Obesity Crystal Jackson is currently in the action stage of change. As such, her goal is to continue with weight loss efforts She has agreed to keep a food journal with 400-500 calories and 35+ grams of protein at supper daily and follow the Category 2 plan Crystal Jackson has been instructed to work up to a goal of 150 minutes of combined cardio and strengthening exercise per week for weight loss and overall health benefits. We discussed the following Behavioral Modification Strategies today: increasing lean protein intake, increasing vegetables and work on meal planning and easy cooking plans, keeping healthy foods in the home, and planning for success   Crystal Jackson has agreed to follow up with our clinic in 2 weeks. She was informed of the importance of frequent follow up visits  to maximize her success with intensive lifestyle modifications for her multiple health conditions.  ALLERGIES: Allergies  Allergen Reactions  . Avapro [Irbesartan] Shortness Of Breath  . Dextromethorphan Hives and Shortness Of Breath  . Guaifenesin & Derivatives Hives and Shortness Of Breath  . Valtrex [Valacyclovir] Hives and Shortness Of Breath  . Citalopram     Other reaction(s): Chills  (intolerance)   . Codeine Nausea And Vomiting  . Duloxetine     Feel like in a fog Other reaction(s): Other (See Comments) Feel like in a fog   . Penicillin G Rash  . Vilazodone Nausea Only    MEDICATIONS: Current Outpatient Medications on File Prior to Visit  Medication Sig Dispense Refill  . albuterol (PROAIR HFA) 108 (90 Base) MCG/ACT inhaler Inhale into the lungs.    . ARNUITY ELLIPTA 200 MCG/ACT AEPB INHALE 1 PUFF INTO THE LUNGS DAILY 30 each 5  . Ascorbic Acid (VITAMIN C) 100 MG tablet Take 100 mg by mouth daily.    . Calcium Carbonate-Vitamin D (CALCIUM-VITAMIN D) 500-200 MG-UNIT tablet Take by mouth.    . chlorpheniramine (CHLOR-TRIMETON) 4 MG tablet Take 1 tablet (4 mg total) by mouth every 6 (six) hours. 120 tablet 0  . Cholecalciferol (VITAMIN D-3) 25 MCG (1000 UT) CAPS Take by mouth.    . clotrimazole-betamethasone (LOTRISONE) cream Qd for rash    . diclofenac (VOLTAREN) 75 MG EC tablet TAKE 1 TABLET BY MOUTH TWICE DAILY WITH FOOD FOR 2 WEEKS THEN AS NEEDED AFTER THAT 180 tablet 0  . dicyclomine (BENTYL) 20 MG tablet Take 20 mg by mouth 4 (four) times daily -  before meals and at bedtime.     Marland Kitchen doxazosin (CARDURA) 1 MG tablet Take 1 mg by mouth daily.    Marland Kitchen gabapentin (NEURONTIN) 100 MG capsule TK 1 TO 2 CS PO HS  11  . metoprolol tartrate (LOPRESSOR) 25 MG tablet Take 25-50 mg by mouth 2 (two) times daily. 25 mg in the am and 50 mg at night    . METRONIDAZOLE, TOPICAL, (METROLOTION) 0.75 % LOTN Apply topically.    . Multiple Vitamin (MULTIVITAMIN) tablet Take 1 tablet by mouth daily.     . Omega-3 Fatty Acids (FISH OIL) 500 MG CAPS Take by mouth.    . pantoprazole (PROTONIX) 40 MG tablet Take 40 mg by mouth daily.     . potassium chloride (K-DUR) 10 MEQ tablet TAKE 2 TABLETS BY MOUTH TWICE DAILY    . SUPER B COMPLEX/C PO Take by mouth.    Marland Kitchen VALACYCLOVIR HCL PO Take 500 mg by mouth 2 (two) times daily. For 3 days prn    . venlafaxine XR (EFFEXOR-XR) 75 MG 24 hr capsule TK  3 CS PO D  3   No current facility-administered medications on file prior to visit.     PAST MEDICAL HISTORY: Past Medical History:  Diagnosis Date  . Anxiety   . Arthritis   . Asthma   . Back pain   . Bilateral swelling of feet   . Constipation   . Depression   . Gallbladder problem   . GERD (gastroesophageal reflux disease)   . History of low potassium   . Hypertension   . Joint pain   . Obesity   . Palpitations   . Shortness of breath   . Sleep apnea     PAST SURGICAL HISTORY: Past Surgical History:  Procedure Laterality Date  . ABDOMINAL HYSTERECTOMY    . APPENDECTOMY    .  CHOLECYSTECTOMY    . KNEE SURGERY Left   . SHOULDER SURGERY Right     SOCIAL HISTORY: Social History   Tobacco Use  . Smoking status: Never Smoker  . Smokeless tobacco: Never Used  Substance Use Topics  . Alcohol use: No    Alcohol/week: 0.0 standard drinks  . Drug use: No    FAMILY HISTORY: Family History  Problem Relation Age of Onset  . Diabetes Mother   . Heart disease Mother   . Anxiety disorder Mother   . Obesity Mother   . Diabetes Father   . Hypertension Father   . Obesity Father     ROS: Review of Systems  Constitutional: Positive for weight loss.  Cardiovascular: Negative for chest pain.       Negative chest pressure  Gastrointestinal: Negative for nausea.  Neurological: Negative for headaches.  Endo/Heme/Allergies:       Negative hypoglycemia    PHYSICAL EXAM: Blood pressure 115/67, pulse 69, temperature 97.7 F (36.5 C), temperature source Oral, height 5\' 4"  (1.626 m), weight 230 lb (104.3 kg), SpO2 97 %. Body mass index is 39.48 kg/m. Physical Exam Vitals signs reviewed.  Constitutional:      Appearance: Normal appearance. She is obese.  Cardiovascular:     Rate and Rhythm: Normal rate.     Pulses: Normal pulses.  Pulmonary:     Effort: Pulmonary effort is normal.     Breath sounds: Normal breath sounds.  Musculoskeletal: Normal range of motion.   Skin:    General: Skin is warm and dry.  Neurological:     Mental Status: She is alert and oriented to person, place, and time.  Psychiatric:        Mood and Affect: Mood normal.        Behavior: Behavior normal.     RECENT LABS AND TESTS: BMET    Component Value Date/Time   NA 131 (L) 08/25/2018 1449   K 3.7 08/25/2018 1449   CL 89 (L) 08/25/2018 1449   CO2 24 08/25/2018 1449   GLUCOSE 113 (H) 08/25/2018 1449   GLUCOSE 100 (H) 10/24/2015 1306   BUN 26 08/25/2018 1449   CREATININE 1.07 (H) 08/25/2018 1449   CALCIUM 9.5 08/25/2018 1449   GFRNONAA 52 (L) 08/25/2018 1449   GFRAA 60 08/25/2018 1449   Lab Results  Component Value Date   HGBA1C 5.8 (H) 07/09/2018   Lab Results  Component Value Date   INSULIN 25.0 (H) 07/09/2018   CBC    Component Value Date/Time   WBC 6.0 07/09/2018 1609   WBC 5.6 10/24/2015 1306   RBC 4.48 07/09/2018 1609   RBC 4.17 10/24/2015 1306   HGB 13.6 07/09/2018 1609   HCT 39.7 07/09/2018 1609   PLT 241 07/09/2018 1609   MCV 89 07/09/2018 1609   MCH 30.4 07/09/2018 1609   MCH 29.0 10/24/2015 1306   MCHC 34.3 07/09/2018 1609   MCHC 33.5 10/24/2015 1306   RDW 13.1 07/09/2018 1609   LYMPHSABS 2.0 07/09/2018 1609   MONOABS 0.4 10/24/2015 1306   EOSABS 0.2 07/09/2018 1609   BASOSABS 0.0 07/09/2018 1609   Iron/TIBC/Ferritin/ %Sat No results found for: IRON, TIBC, FERRITIN, IRONPCTSAT Lipid Panel     Component Value Date/Time   CHOL 158 07/09/2018 1609   TRIG 92 07/09/2018 1609   HDL 54 07/09/2018 1609   LDLCALC 86 07/09/2018 1609   Hepatic Function Panel     Component Value Date/Time   PROT 7.0 08/25/2018 1449  ALBUMIN 4.3 08/25/2018 1449   AST 21 08/25/2018 1449   ALT 30 08/25/2018 1449   ALKPHOS 47 08/25/2018 1449   BILITOT 0.2 08/25/2018 1449      Component Value Date/Time   TSH 2.210 07/09/2018 1609      OBESITY BEHAVIORAL INTERVENTION VISIT  Today's visit was # 7   Starting weight: 242 lbs Starting date:  07/09/2018 Today's weight : 230 lbs Today's date: 10/06/2018 Total lbs lost to date: 12 At least 15 minutes were spent on discussing the following behavioral intervention visit.   ASK: We discussed the diagnosis of obesity with Neil Crouch today and Lavergne agreed to give [name] permission to discuss obesity behavioral modification therapy today.  ASSESS: Calli has the diagnosis of obesity and her BMI today is 39.46 Crystal Jackson is in the action stage of change   ADVISE: Crystal Jackson was educated on the multiple health risks of obesity as well as the benefit of weight loss to improve her health. She was advised of the need for long term treatment and the importance of lifestyle modifications to improve her current health and to decrease her risk of future health problems.  AGREE: Multiple dietary modification options and treatment options were discussed and  Baylei agreed to follow the recommendations documented in the above note.  ARRANGE: Ambrea was educated on the importance of frequent visits to treat obesity as outlined per CMS and USPSTF guidelines and agreed to schedule her next follow up appointment today.  I, Burt Knack, am acting as transcriptionist for Debbra Riding, MD  I have reviewed the above documentation for accuracy and completeness, and I agree with the above. - Debbra Riding, MD

## 2018-10-08 ENCOUNTER — Ambulatory Visit: Payer: Medicare Other | Admitting: Physician Assistant

## 2018-10-10 ENCOUNTER — Other Ambulatory Visit: Payer: Self-pay

## 2018-10-10 ENCOUNTER — Ambulatory Visit
Admission: RE | Admit: 2018-10-10 | Discharge: 2018-10-10 | Disposition: A | Discharge status: Home / Self Care | Payer: Medicare Other | Source: Ambulatory Visit | Attending: Orthopaedic Surgery | Admitting: Orthopaedic Surgery

## 2018-10-10 DIAGNOSIS — M4807 Spinal stenosis, lumbosacral region: Secondary | ICD-10-CM

## 2018-10-13 NOTE — Progress Notes (Unsigned)
Office: [phone]  /  Fax: [phone]    Date: October 22, 2018   Appointment Start Time: *** Duration: *** minutes Provider: Lawerance Cruel, Psy.D. Type of Session: Individual Therapy  Location of Patient: *** Location of Provider: {Location of Service:22491} Type of Contact: Telepsychological Visit via [employer]   Session Content: Crystal Jackson is a 73 y.o. female presenting via [employer] Web[employer] for a follow-up appointment to address the previously established treatment goal of decreasing emotional eating. Today's appointment was a telepsychological visit, as it is an option for appointments to reduce exposure to COVID-19. Crystal Jackson expressed understanding regarding the rationale for telepsychological services, and provided verbal consent for today's appointment. Prior to proceeding with today's appointment, Crystal Jackson's physical location at the time of this appointment was obtained. Analayah reported she was at *** and provided the address. In the event of technical difficulties, Crystal Jackson shared a phone number she could be reached at. Crystal Jackson and this provider participated in today's telepsychological service. Crystal Jackson, Crystal Jackson denied anyone else being present in the room or on the WebEx appointment ***.  This provider conducted a brief check-in and verbally administered the PHQ-9 and GAD-7. *** Crystal Jackson was receptive to today's session as evidenced by openness to sharing, responsiveness to feedback, and ***.  Mental Status Examination:  Appearance: {Appearance:22431} Behavior: {Behavior:22445} Mood: {gbmood:21757} Affect: {Affect:22436} Speech: {Speech:22432} Eye Contact: {Eye Contact:22433} Psychomotor Activity: {Motor Activity:22434} Thought Process: {thought process:22448}  Content/Perceptual Disturbances: {disturbances:22451} Orientation: {Orientation:22437} Cognition/Sensorium: {gbcognition:22449} Insight: {Insight:22446} Judgment: {Insight:22446}  Structured Assessment Results: The Patient  Health Questionnaire-9 (PHQ-9) is a self-report measure that assesses symptoms and severity of depression over the course of the last two weeks. Crystal Jackson obtained a score of *** suggesting {GBPHQ9SEVERITY:21752}. Crystal Jackson finds the endorsed symptoms to be {gbphq9difficulty:21754}. Little interest or pleasure in doing things ***  Feeling down, depressed, or hopeless ***  Trouble falling or staying asleep, or sleeping too much ***  Feeling tired or having little energy ***  Poor appetite or overeating ***  Feeling bad about yourself --- or that you are a failure or have let yourself or your family down ***  Trouble concentrating on things, such as reading the newspaper or watching television ***  Moving or speaking so slowly that other people could have noticed? Or the opposite --- being so fidgety or restless that you have been moving around a lot more than usual ***  Thoughts that you would be better off dead or hurting yourself in some way ***  PHQ-9 Score ***    The Generalized Anxiety Disorder-7 (GAD-7) is a brief self-report measure that assesses symptoms of anxiety over the course of the last two weeks. Crystal Jackson obtained a score of *** suggesting {gbgad7severity:21753}. Crystal Jackson finds the endorsed symptoms to be {gbphq9difficulty:21754}. 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 Crystal Jackson has demonstrated progress in her goal as evidenced by  {gbtxprogress:22839}. Crystal Jackson Crystal Jackson reported {gbtxprogress2:22951}.  Plan: Crystal Jackson continues to appear able and willing to participate as evidenced by engagement in reciprocal conversation, and  asking questions for clarification as appropriate. The next appointment will be scheduled in {gbweeks:21758}, which will be via [employer]. The next session will focus on reviewing learned skills, and working towards the established treatment goal.***

## 2018-10-20 ENCOUNTER — Ambulatory Visit (INDEPENDENT_AMBULATORY_CARE_PROVIDER_SITE_OTHER): Payer: Medicare Other | Admitting: Physician Assistant

## 2018-10-20 ENCOUNTER — Encounter (INDEPENDENT_AMBULATORY_CARE_PROVIDER_SITE_OTHER): Payer: Self-pay | Admitting: Physician Assistant

## 2018-10-20 ENCOUNTER — Other Ambulatory Visit: Payer: Self-pay

## 2018-10-20 VITALS — BP 111/71 | HR 64 | Temp 97.8°F | Ht 64.0 in | Wt 230.0 lb

## 2018-10-20 DIAGNOSIS — Z6839 Body mass index (BMI) 39.0-39.9, adult: Secondary | ICD-10-CM | POA: Diagnosis not present

## 2018-10-20 DIAGNOSIS — R7303 Prediabetes: Secondary | ICD-10-CM | POA: Diagnosis not present

## 2018-10-22 ENCOUNTER — Ambulatory Visit (INDEPENDENT_AMBULATORY_CARE_PROVIDER_SITE_OTHER): Payer: Medicare Other | Admitting: Psychology

## 2018-10-22 NOTE — Progress Notes (Signed)
Office: [phone]  /  Fax: [phone]   HPI:   Chief Complaint: OBESITY Menaal is here to discuss her progress with her obesity treatment plan. She is on the Category 2 plan and is following her eating plan approximately 75 to 80 % of the time. She states she is exercising 0 minutes 0 times per week. Leane reports that she has been journaling all day and she is enjoying it. She wants to set a goal of losing 1 pound per week, as he is goal oriented. Her weight is 230 lb (104.3 kg) today and she has maintained weight since her last visit. She has lost 12 lbs since starting treatment with [name].  Pre-Diabetes Ema has a diagnosis of prediabetes based on her elevated Hgb A1c and was informed this puts her at greater risk of developing diabetes. Lacey is not on medications and she continues to work on diet and exercise to decrease risk of diabetes. She denies polyphagia.  ASSESSMENT AND PLAN:  Prediabetes  Class 2 severe obesity with serious comorbidity and body mass index (BMI) of 39.0 to 39.9 in adult, unspecified obesity type [facility])  PLAN:  Pre-Diabetes Yetzali will continue to work on weight loss, exercise, and decreasing simple carbohydrates in her diet to help decrease the risk of diabetes. She was informed that eating too many simple carbohydrates or too many calories at one sitting increases the likelihood of GI side effects. Cassady will follow up with [name] as directed to monitor her progress.  Obesity Cyncere is currently in the action stage of change. As such, her goal is to continue with weight loss efforts She has agreed to keep a food journal with 1200 calories and 85 grams of protein daily Dereona has been instructed to work up to a goal of 150 minutes of combined cardio and strengthening exercise per week for weight loss and overall health benefits. We discussed the following Behavioral Modification Strategies today: keeping healthy foods in the home and work on  meal planning and easy cooking plans  Ashayla has agreed to follow up with our clinic in 2 weeks. She was informed of the importance of frequent follow up visits to maximize her success with intensive lifestyle modifications for her multiple health conditions.  I spent > than 50% of the 25 minute visit on counseling as documented in the note.    ALLERGIES: Allergies  Allergen Reactions   Avapro [Irbesartan] Shortness Of Breath   Dextromethorphan Hives and Shortness Of Breath   Guaifenesin & Derivatives Hives and Shortness Of Breath   Valtrex [Valacyclovir] Hives and Shortness Of Breath   Citalopram     Other reaction(s): Chills (intolerance)    Codeine Nausea And Vomiting   Duloxetine     Feel like in a fog Other reaction(s): Other (See Comments) Feel like in a fog    Penicillin G Rash   Vilazodone Nausea Only    MEDICATIONS: Current Outpatient Medications on File Prior to Visit  Medication Sig Dispense Refill   estradiol (ESTRACE) 1 MG tablet Take 1 mg by mouth daily.     albuterol (PROAIR HFA) 108 (90 Base) MCG/ACT inhaler Inhale into the lungs.     ARNUITY ELLIPTA 200 MCG/ACT AEPB INHALE 1 PUFF INTO THE LUNGS DAILY 30 each 5   Ascorbic Acid (VITAMIN C) 100 MG tablet Take 100 mg by mouth daily.     Calcium Carbonate-Vitamin D (CALCIUM-VITAMIN D) 500-200 MG-UNIT tablet Take by mouth.     chlorpheniramine (CHLOR-TRIMETON) 4 MG tablet  Take 1 tablet (4 mg total) by mouth every 6 (six) hours. 120 tablet 0   chlorthalidone (HYGROTON) 25 MG tablet Take 0.5 tablets (12.5 mg total) by mouth daily. 30 tablet 0   Cholecalciferol (VITAMIN D-3) 25 MCG (1000 UT) CAPS Take by mouth.     clotrimazole-betamethasone (LOTRISONE) cream Qd for rash     diclofenac (VOLTAREN) 75 MG EC tablet TAKE 1 TABLET BY MOUTH TWICE DAILY WITH FOOD FOR 2 WEEKS THEN AS NEEDED AFTER THAT 180 tablet 0   dicyclomine (BENTYL) 20 MG tablet Take 20 mg by mouth 4 (four) times daily -  before meals  and at bedtime.      doxazosin (CARDURA) 1 MG tablet Take 1 mg by mouth daily.     gabapentin (NEURONTIN) 100 MG capsule TK 1 TO 2 CS PO HS  11   metoprolol tartrate (LOPRESSOR) 25 MG tablet Take 25-50 mg by mouth 2 (two) times daily. 25 mg in the am and 50 mg at night     METRONIDAZOLE, TOPICAL, (METROLOTION) 0.75 % LOTN Apply topically.     Multiple Vitamin (MULTIVITAMIN) tablet Take 1 tablet by mouth daily.      Omega-3 Fatty Acids (FISH OIL) 500 MG CAPS Take by mouth.     pantoprazole (PROTONIX) 40 MG tablet Take 40 mg by mouth daily.      potassium chloride (K-DUR) 10 MEQ tablet TAKE 2 TABLETS BY MOUTH TWICE DAILY     SUPER B COMPLEX/C PO Take by mouth.     VALACYCLOVIR HCL PO Take 500 mg by mouth 2 (two) times daily. For 3 days prn     venlafaxine XR (EFFEXOR-XR) 75 MG 24 hr capsule TK 3 CS PO D  3   No current facility-administered medications on file prior to visit.     PAST MEDICAL HISTORY: Past Medical History:  Diagnosis Date   Anxiety    Arthritis    Asthma    Back pain    Bilateral swelling of feet    Constipation    Depression    Gallbladder problem    GERD (gastroesophageal reflux disease)    History of low potassium    Hypertension    Joint pain    Obesity    Palpitations    Shortness of breath    Sleep apnea     PAST SURGICAL HISTORY: Past Surgical History:  Procedure Laterality Date   ABDOMINAL HYSTERECTOMY     APPENDECTOMY     CHOLECYSTECTOMY     KNEE SURGERY Left    SHOULDER SURGERY Right     SOCIAL HISTORY: Social History   Tobacco Use   Smoking status: Never Smoker   Smokeless tobacco: Never Used  Substance Use Topics   Alcohol use: No    Alcohol/week: 0.0 standard drinks   Drug use: No    FAMILY HISTORY: Family History  Problem Relation Age of Onset   Diabetes Mother    Heart disease Mother    Anxiety disorder Mother    Obesity Mother    Diabetes Father    Hypertension Father     Obesity Father     ROS: Review of Systems  Constitutional: Negative for weight loss.  Endo/Heme/Allergies:       Negative for polyphagia    PHYSICAL EXAM: Blood pressure 111/71, pulse 64, temperature 97.8 F (36.6 C), temperature source Oral, height 5\' 4"  (1.626 m), weight 230 lb (104.3 kg), SpO2 97 %. Body mass index is 39.48 kg/m. Physical Exam Vitals  signs reviewed.  Constitutional:      Appearance: Normal appearance. She is well-developed. She is obese.  Cardiovascular:     Rate and Rhythm: Normal rate.  Pulmonary:     Effort: Pulmonary effort is normal.  Musculoskeletal: Normal range of motion.  Skin:    General: Skin is warm and dry.  Neurological:     Mental Status: She is alert and oriented to person, place, and time.  Psychiatric:        Mood and Affect: Mood normal.        Behavior: Behavior normal.     RECENT LABS AND TESTS: BMET    Component Value Date/Time   NA 131 (L) 08/25/2018 1449   K 3.7 08/25/2018 1449   CL 89 (L) 08/25/2018 1449   CO2 24 08/25/2018 1449   GLUCOSE 113 (H) 08/25/2018 1449   GLUCOSE 100 (H) 10/24/2015 1306   BUN 26 08/25/2018 1449   CREATININE 1.07 (H) 08/25/2018 1449   CALCIUM 9.5 08/25/2018 1449   GFRNONAA 52 (L) 08/25/2018 1449   GFRAA 60 08/25/2018 1449   Lab Results  Component Value Date   HGBA1C 5.8 (H) 07/09/2018   Lab Results  Component Value Date   INSULIN 25.0 (H) 07/09/2018   CBC    Component Value Date/Time   WBC 6.0 07/09/2018 1609   WBC 5.6 10/24/2015 1306   RBC 4.48 07/09/2018 1609   RBC 4.17 10/24/2015 1306   HGB 13.6 07/09/2018 1609   HCT 39.7 07/09/2018 1609   PLT 241 07/09/2018 1609   MCV 89 07/09/2018 1609   MCH 30.4 07/09/2018 1609   MCH 29.0 10/24/2015 1306   MCHC 34.3 07/09/2018 1609   MCHC 33.5 10/24/2015 1306   RDW 13.1 07/09/2018 1609   LYMPHSABS 2.0 07/09/2018 1609   MONOABS 0.4 10/24/2015 1306   EOSABS 0.2 07/09/2018 1609   BASOSABS 0.0 07/09/2018 1609   Iron/TIBC/Ferritin/  %Sat No results found for: IRON, TIBC, FERRITIN, IRONPCTSAT Lipid Panel     Component Value Date/Time   CHOL 158 07/09/2018 1609   TRIG 92 07/09/2018 1609   HDL 54 07/09/2018 1609   LDLCALC 86 07/09/2018 1609   Hepatic Function Panel     Component Value Date/Time   PROT 7.0 08/25/2018 1449   ALBUMIN 4.3 08/25/2018 1449   AST 21 08/25/2018 1449   ALT 30 08/25/2018 1449   ALKPHOS 47 08/25/2018 1449   BILITOT 0.2 08/25/2018 1449      Component Value Date/Time   TSH 2.210 07/09/2018 1609     Ref. Range 07/09/2018 16:09  Vitamin D, 25-Hydroxy Latest Ref Range: 30.0 - 100.0 ng/mL 58.0    OBESITY BEHAVIORAL INTERVENTION VISIT  Today's visit was # 8   Starting weight: 242 lbs Starting date: 07/09/2018 Today's weight : 230 lbs  Today's date: 10/20/2018 Total lbs lost to date: 12    10/20/2018  Height 5\' 4"  (1.626 m)  Weight 230 lb (104.3 kg)  BMI (Calculated) 39.46  BLOOD PRESSURE - SYSTOLIC 111  BLOOD PRESSURE - DIASTOLIC 71   Body Fat % 48.5 %  Total Body Water (lbs) 80.6 lbs    ASK: We discussed the diagnosis of obesity with Neil Crouchebecca Utsey today and [name] agreed to give us permission to discuss obesity behavioral modification therapy today.  ASSESS: [name] has the diagnosis of obesity and her BMI today is 39.46 [name] is in the action stage of change   ADVISE: [name] was educated on the multiple health risks of obesity as  well as the benefit of weight loss to improve her health. She was advised of the need for long term treatment and the importance of lifestyle modifications to improve her current health and to decrease her risk of future health problems.  AGREE: Multiple dietary modification options and treatment options were discussed and  Ivorie agreed to follow the recommendations documented in the above note.  ARRANGE: Clytie was educated on the importance of frequent visits to treat obesity as outlined per CMS and USPSTF guidelines and agreed to  schedule her next follow up appointment today.  Corey Skains, am acting as transcriptionist for Abby Potash, PA-C I, Abby Potash, PA-C have reviewed above note and agree with its content

## 2018-10-23 ENCOUNTER — Encounter: Payer: Self-pay | Admitting: Physician Assistant

## 2018-10-23 ENCOUNTER — Other Ambulatory Visit: Payer: Self-pay

## 2018-10-23 ENCOUNTER — Ambulatory Visit (INDEPENDENT_AMBULATORY_CARE_PROVIDER_SITE_OTHER): Payer: Medicare Other | Admitting: Physician Assistant

## 2018-10-23 DIAGNOSIS — M545 Low back pain, unspecified: Secondary | ICD-10-CM

## 2018-10-23 NOTE — Progress Notes (Signed)
HPI: Ms. Crystal Jackson comes in today to go over the MRI of her lumbar spine.  She continues to have low back pain.  She continues to have tingling and numbness in the tips of all the toes in both feet.  She is having no radicular symptoms down the leg.  She is prediabetic.  She reports that she has lost 12 pounds since July with, weight wellness plan.  Spent placed on Neurontin in the past by her primary care doctor to help sleep.  She takes 100 to 300 mg occasionally. MRI of the lumbar spine dated 10/10/2018 showed bilateral facet joint effusions at L4-5.  Otherwise at L4-5 borderline left subarticular lateral recess stenosis due to disc bulge and facet arthropathy.  Borderline central narrowing.  Small Tarlov cyst upper sacrum.  Impression: Low back pain  Plan: We will have her undergo L4-5 facet injections with Dr. Ernestina Patches in the near future.  She follow-up with [name] a month after the injection to see what type of response she had to this.  Also asking to go up on her Neurontin take this 300 mg nightly.  Questions were encouraged and answered.  MRI images were reviewed with the patient.

## 2018-10-27 ENCOUNTER — Other Ambulatory Visit: Payer: Self-pay

## 2018-10-27 ENCOUNTER — Ambulatory Visit (INDEPENDENT_AMBULATORY_CARE_PROVIDER_SITE_OTHER): Payer: Medicare Other | Admitting: Psychology

## 2018-10-27 DIAGNOSIS — F330 Major depressive disorder, recurrent, mild: Secondary | ICD-10-CM | POA: Diagnosis not present

## 2018-10-27 NOTE — Progress Notes (Signed)
Office: [phone]  /  Fax: [phone]    Date: October 27, 2018    Appointment Start Time: 11:34am Duration: 23 minutes Provider: Lawerance Cruel, Psy.D. Type of Session: Individual Therapy  Location of Patient: Home Location of Provider: Provider's Home Type of Contact: Telepsychological Visit via [employer] Web[employer]   Session Content:Of note, this provider called Crystal Jackson at 11:33am as she did not present for the [facility] appointment. She reported she was experiencing difficulty connecting from her computer; therefore, assistance was provided. As such, today's appointment was initiated 4 minutes late.  Crystal Jackson is a 73 y.o. female presenting via [employer] Web[employer] for a follow-up appointment to address the previously established treatment goal of decreasing emotional eating. Today's appointment was a telepsychological visit, as it is an option for appointments to reduce exposure to COVID-19. Crystal Jackson expressed understanding regarding the rationale for telepsychological services, and provided verbal consent for today's appointment. Prior to proceeding with today's appointment, Crystal Jackson's physical location at the time of this appointment was obtained. In the event of technical difficulties, Crystal Jackson shared a phone number she could be reached at. Crystal Jackson and this provider participated in today's telepsychological service. Also, Crystal Jackson denied anyone else being present in the room or on the WebEx appointment.  This provider conducted a brief check-in and verbally administered the PHQ-9 and GAD-7. Crystal Jackson shared about recent events, including medical appointments and financial concerns. She also shared about engaging in mindfulness exercises, including videos by Crystal Jackson on YouTube. She described them as helpful. Positive reinforcement was provided. Crystal Jackson shared deviations from the meal plan due to having difficulty finding certain foods. It was recommended she discuss alternative options with Crystal Cliche, PA-C  during their next appointment (November 10, 2018); she agreed. Notably, Crystal Jackson denied episodes of emotional eating since the last appointment with this provider. Crystal Jackson was led through a mindfulness exercise, "A Taste of Mindfulness," during today's appointment. Her experience after was processed. Crystal Jackson described the exercise as "very relaxing" and it made her realize she was thirsty. Crystal Jackson provided verbal consent during today's appointment for this provider to send the handout for today's exercise via e-mail. Overall, Tala was receptive to today's session as evidenced by openness to sharing, responsiveness to feedback, and willingness to engage in mindfulness exercises.  Mental Status Examination:  Appearance: neat Behavior: cooperative Mood: euthymic Affect: mood congruent Speech: normal in rate, volume, and tone Eye Contact: appropriate Psychomotor Activity: appropriate Thought Process: linear, logical, and goal directed  Content/Perceptual Disturbances: no hallucinations, delusions, bizarre thinking or behavior reported or observed and no evidence of suicidal and homicidal ideation, plan, and intent Orientation: time, person, place and purpose of appointment Cognition/Sensorium: memory, attention, language, and fund of knowledge intact  Insight: good Judgment: good  Structured Assessment Results: The Patient Health Questionnaire-9 (PHQ-9) is a self-report measure that assesses symptoms and severity of depression over the course of the last two weeks. Crystal Jackson obtained a score of 2 suggesting minimal depression. Crystal Jackson finds the endorsed symptoms to be somewhat difficult. Little interest or pleasure in doing things 0  Feeling down, depressed, or hopeless 1  Trouble falling or staying asleep, or sleeping too much 0  Feeling tired or having little energy 0  Poor appetite or overeating 0  Feeling bad about yourself --- or that you are a failure or have let yourself or your family down  0  Trouble concentrating on things, such as reading the newspaper or watching television 1  Moving or speaking so slowly that other people could have noticed? Or  the opposite --- being so fidgety or restless that you have been moving around a lot more than usual 0  Thoughts that you would be better off dead or hurting yourself in some way 0  PHQ-9 Score 2    The Generalized Anxiety Disorder-7 (GAD-7) is a brief self-report measure that assesses symptoms of anxiety over the course of the last two weeks. Crystal Jackson obtained a score of 0. Feeling nervous, anxious, on edge 0  Not being able to stop or control worrying 0  Worrying too much about different things 0  Trouble relaxing 0  Being so restless that it's hard to sit still 0  Becoming easily annoyed or irritable 0  Feeling afraid as if something awful might happen 0  GAD-7 Score 0   Interventions:  Conducted a brief chart review Verbal administration of PHQ-9 and GAD-7 for symptom monitoring Provided empathic reflections and validation Reviewed content from the previous session Engaged patient in a mindfulness exercise Provided positive reinforcement Employed supportive psychotherapy interventions to facilitate reduced distress, and to improve coping skills with identified stressors Employed acceptance and commitment interventions to emphasize mindfulness and acceptance without struggle  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 Crystal Jackson has demonstrated progress in her goal as evidenced by increased awareness of hunger patterns and triggers for emotional eating. Crystal Jackson also reported a reduction in emotional eating and continues to demonstrate willingness to engage in learned skill(s).  Plan: Crystal Jackson continues to appear able and willing to  participate as evidenced by engagement in reciprocal conversation, and asking questions for clarification as appropriate. The next appointment will be scheduled in one month, which will be via [employer]. The next session will focus on reviewing learned skills, and termination.

## 2018-11-10 ENCOUNTER — Other Ambulatory Visit: Payer: Self-pay

## 2018-11-10 ENCOUNTER — Ambulatory Visit (INDEPENDENT_AMBULATORY_CARE_PROVIDER_SITE_OTHER): Payer: Medicare Other | Admitting: Physician Assistant

## 2018-11-10 VITALS — BP 120/70 | HR 75 | Temp 97.9°F | Ht 64.0 in | Wt 228.0 lb

## 2018-11-10 DIAGNOSIS — R7303 Prediabetes: Secondary | ICD-10-CM | POA: Diagnosis not present

## 2018-11-10 DIAGNOSIS — Z6839 Body mass index (BMI) 39.0-39.9, adult: Secondary | ICD-10-CM

## 2018-11-10 DIAGNOSIS — E559 Vitamin D deficiency, unspecified: Secondary | ICD-10-CM | POA: Diagnosis not present

## 2018-11-10 NOTE — Progress Notes (Signed)
Office: [phone]  /  Fax: [phone]   HPI:   Chief Complaint: OBESITY Truda is here to discuss her progress with her obesity treatment plan. She is on the  keep a food journal with 1200 calories and 85g of protein  daily and is following her eating plan approximately 80 % of the time. She states she is exercising 0 minutes 0 times per week. Aslyn reports that she indulged in chicken pot pie and carrot cake recently that her friend made for her. She is not getting all of her protein in daily. She wants to talk about holiday strategies today.  Her weight is 228 lb (103.4 kg) today and has had a weight loss of 2 pounds over a period of 3 weeks since her last visit. She has lost 14 lbs since starting treatment with [name].  Vitamin D deficiency Nirali has a diagnosis of vitamin D deficiency. She is currently taking vit D and denies nausea, vomiting or muscle weakness.  Pre-Diabetes Chanin has a diagnosis of prediabetes based on her elevated HgA1c and was informed this puts her at greater risk of developing diabetes. She is not taking metformin currently and continues to work on diet and exercise to decrease risk of diabetes. She denies nausea, polyphagia, or hypoglycemia.  ASSESSMENT AND PLAN:  Vitamin D deficiency - Plan: Vitamin D (25 hydroxy)  Prediabetes - Plan: Comprehensive metabolic panel, Hemoglobin A1c, Insulin, random  Class 2 severe obesity with serious comorbidity and body mass index (BMI) of 39.0 to 39.9 in adult, unspecified obesity type ([place])  PLAN: Vitamin D Deficiency Oceanna was informed that low vitamin D levels contributes to fatigue and are associated with obesity, breast, and colon cancer. She agrees to continue to take prescription Vit D @50 ,000 IU every week and will follow up for routine testing of vitamin D, at least 2-3 times per year. She was informed of the risk of over-replacement of vitamin D and agrees to not increase her dose unless she discusses  this with [name] first. We will repeat vit D level today.   Pre-Diabetes Kamaiyah will continue to work on weight loss, exercise, and decreasing simple carbohydrates in her diet to help decrease the risk of diabetes. We dicussed metformin including benefits and risks. She was informed that eating too many simple carbohydrates or too many calories at one sitting increases the likelihood of GI side effects. Anayely agreed to follow up with [name] as directed to monitor her progress. We will repeat HgA1c, Insulin, and CMP today.   Obesity Camera is currently in the action stage of change. As such, her goal is to continue with weight loss efforts She has agreed to keep a food journal with 1200 calories and 85g of protein daily.  Elfa has been instructed to work up to a goal of 150 minutes of combined cardio and strengthening exercise per week for weight loss and overall health benefits. We discussed the following Behavioral Modification Strategies today: increasing lean protein intake, work on meal planning and easy cooking plans and holiday eating strategies    Birdell has agreed to follow up with our clinic in 2-3 weeks. She was informed of the importance of frequent follow up visits to maximize her success with intensive lifestyle modifications for her multiple health conditions.  ALLERGIES: Allergies  Allergen Reactions  . Avapro [Irbesartan] Shortness Of Breath  . Dextromethorphan Hives and Shortness Of Breath  . Guaifenesin & Derivatives Hives and Shortness Of Breath  . Valtrex [Valacyclovir] Hives and Shortness  Of Breath  . Citalopram     Other reaction(s): Chills (intolerance)   . Codeine Nausea And Vomiting  . Duloxetine     Feel like in a fog Other reaction(s): Other (See Comments) Feel like in a fog   . Penicillin G Rash  . Vilazodone Nausea Only    MEDICATIONS: Current Outpatient Medications on File Prior to Visit  Medication Sig Dispense Refill  . albuterol (PROAIR HFA) 108  (90 Base) MCG/ACT inhaler Inhale into the lungs.    . ARNUITY ELLIPTA 200 MCG/ACT AEPB INHALE 1 PUFF INTO THE LUNGS DAILY 30 each 5  . Ascorbic Acid (VITAMIN C) 100 MG tablet Take 100 mg by mouth daily.    . Calcium Carbonate-Vitamin D (CALCIUM-VITAMIN D) 500-200 MG-UNIT tablet Take by mouth.    . chlorpheniramine (CHLOR-TRIMETON) 4 MG tablet Take 1 tablet (4 mg total) by mouth every 6 (six) hours. 120 tablet 0  . chlorthalidone (HYGROTON) 25 MG tablet Take 0.5 tablets (12.5 mg total) by mouth daily. 30 tablet 0  . Cholecalciferol (VITAMIN D-3) 25 MCG (1000 UT) CAPS Take by mouth.    . clotrimazole-betamethasone (LOTRISONE) cream Qd for rash    . diclofenac (VOLTAREN) 75 MG EC tablet TAKE 1 TABLET BY MOUTH TWICE DAILY WITH FOOD FOR 2 WEEKS THEN AS NEEDED AFTER THAT 180 tablet 0  . dicyclomine (BENTYL) 20 MG tablet Take 20 mg by mouth 4 (four) times daily -  before meals and at bedtime.     Marland Kitchen doxazosin (CARDURA) 1 MG tablet Take 1 mg by mouth daily.    Marland Kitchen estradiol (ESTRACE) 1 MG tablet Take 1 mg by mouth daily.    Marland Kitchen gabapentin (NEURONTIN) 100 MG capsule TK 1 TO 2 CS PO HS  11  . metoprolol tartrate (LOPRESSOR) 25 MG tablet Take 25-50 mg by mouth 2 (two) times daily. 25 mg in the am and 50 mg at night    . METRONIDAZOLE, TOPICAL, (METROLOTION) 0.75 % LOTN Apply topically.    . Multiple Vitamin (MULTIVITAMIN) tablet Take 1 tablet by mouth daily.     . Omega-3 Fatty Acids (FISH OIL) 500 MG CAPS Take by mouth.    . pantoprazole (PROTONIX) 40 MG tablet Take 40 mg by mouth daily.     . potassium chloride (K-DUR) 10 MEQ tablet TAKE 2 TABLETS BY MOUTH TWICE DAILY    . SUPER B COMPLEX/C PO Take by mouth.    Marland Kitchen VALACYCLOVIR HCL PO Take 500 mg by mouth 2 (two) times daily. For 3 days prn    . venlafaxine XR (EFFEXOR-XR) 75 MG 24 hr capsule TK 3 CS PO D  3   No current facility-administered medications on file prior to visit.     PAST MEDICAL HISTORY: Past Medical History:  Diagnosis Date  . Anxiety    . Arthritis   . Asthma   . Back pain   . Bilateral swelling of feet   . Constipation   . Depression   . Gallbladder problem   . GERD (gastroesophageal reflux disease)   . History of low potassium   . Hypertension   . Joint pain   . Obesity   . Palpitations   . Shortness of breath   . Sleep apnea     PAST SURGICAL HISTORY: Past Surgical History:  Procedure Laterality Date  . ABDOMINAL HYSTERECTOMY    . APPENDECTOMY    . CHOLECYSTECTOMY    . KNEE SURGERY Left   . SHOULDER SURGERY Right  SOCIAL HISTORY: Social History   Tobacco Use  . Smoking status: Never Smoker  . Smokeless tobacco: Never Used  Substance Use Topics  . Alcohol use: No    Alcohol/week: 0.0 standard drinks  . Drug use: No    FAMILY HISTORY: Family History  Problem Relation Age of Onset  . Diabetes Mother   . Heart disease Mother   . Anxiety disorder Mother   . Obesity Mother   . Diabetes Father   . Hypertension Father   . Obesity Father     ROS: Review of Systems  Constitutional: Positive for weight loss.  Gastrointestinal: Negative for nausea and vomiting.  Musculoskeletal:       Negative for muscle weakness  Endo/Heme/Allergies:       Negative for hypoglycemia     PHYSICAL EXAM: Blood pressure 120/70, pulse 75, temperature 97.9 F (36.6 C), temperature source Oral, height 5\' 4"  (1.626 m), weight 228 lb (103.4 kg), SpO2 98 %. Body mass index is 39.14 kg/m. Physical Exam Vitals signs reviewed.  Constitutional:      Appearance: Normal appearance. She is obese.  HENT:     Head: Normocephalic.     Nose: Nose normal.  Neck:     Musculoskeletal: Normal range of motion.  Cardiovascular:     Rate and Rhythm: Normal rate.  Pulmonary:     Effort: Pulmonary effort is normal.  Musculoskeletal: Normal range of motion.  Skin:    General: Skin is warm and dry.  Neurological:     Mental Status: She is alert and oriented to person, place, and time.  Psychiatric:        Mood and  Affect: Mood normal.        Behavior: Behavior normal.     RECENT LABS AND TESTS: BMET    Component Value Date/Time   NA 131 (L) 08/25/2018 1449   K 3.7 08/25/2018 1449   CL 89 (L) 08/25/2018 1449   CO2 24 08/25/2018 1449   GLUCOSE 113 (H) 08/25/2018 1449   GLUCOSE 100 (H) 10/24/2015 1306   BUN 26 08/25/2018 1449   CREATININE 1.07 (H) 08/25/2018 1449   CALCIUM 9.5 08/25/2018 1449   GFRNONAA 52 (L) 08/25/2018 1449   GFRAA 60 08/25/2018 1449   Lab Results  Component Value Date   HGBA1C 5.8 (H) 07/09/2018   Lab Results  Component Value Date   INSULIN 25.0 (H) 07/09/2018   CBC    Component Value Date/Time   WBC 6.0 07/09/2018 1609   WBC 5.6 10/24/2015 1306   RBC 4.48 07/09/2018 1609   RBC 4.17 10/24/2015 1306   HGB 13.6 07/09/2018 1609   HCT 39.7 07/09/2018 1609   PLT 241 07/09/2018 1609   MCV 89 07/09/2018 1609   MCH 30.4 07/09/2018 1609   MCH 29.0 10/24/2015 1306   MCHC 34.3 07/09/2018 1609   MCHC 33.5 10/24/2015 1306   RDW 13.1 07/09/2018 1609   LYMPHSABS 2.0 07/09/2018 1609   MONOABS 0.4 10/24/2015 1306   EOSABS 0.2 07/09/2018 1609   BASOSABS 0.0 07/09/2018 1609   Iron/TIBC/Ferritin/ %Sat No results found for: IRON, TIBC, FERRITIN, IRONPCTSAT Lipid Panel     Component Value Date/Time   CHOL 158 07/09/2018 1609   TRIG 92 07/09/2018 1609   HDL 54 07/09/2018 1609   LDLCALC 86 07/09/2018 1609   Hepatic Function Panel     Component Value Date/Time   PROT 7.0 08/25/2018 1449   ALBUMIN 4.3 08/25/2018 1449   AST 21 08/25/2018 1449  ALT 30 08/25/2018 1449   ALKPHOS 47 08/25/2018 1449   BILITOT 0.2 08/25/2018 1449      Component Value Date/Time   TSH 2.210 07/09/2018 1609     Ref. Range 07/09/2018 16:09  Vitamin D, 25-Hydroxy Latest Ref Range: 30.0 - 100.0 ng/mL 58.0     OBESITY BEHAVIORAL INTERVENTION VISIT  Today's visit was # 9   Starting weight: 242 lbs Starting date: 07/09/18 Today's weight : Weight: 228 lb (103.4 kg)  Today's date:  11/10/2018 Total lbs lost to date: 14 lbs At least 15 minutes were spent on discussing the following behavioral intervention visit.   ASK: We discussed the diagnosis of obesity with Neil Crouch today and Telina agreed to give [name] permission to discuss obesity behavioral modification therapy today.  ASSESS: Otilia has the diagnosis of obesity and her BMI today is 39.12 [place] is in the action stage of change   ADVISE: Anitra was educated on the multiple health risks of obesity as well as the benefit of weight loss to improve her health. She was advised of the need for long term treatment and the importance of lifestyle modifications to improve her current health and to decrease her risk of future health problems.  AGREE: Multiple dietary modification options and treatment options were discussed and  Ahleah agreed to follow the recommendations documented in the above note.  ARRANGE: Mylo was educated on the importance of frequent visits to treat obesity as outlined per CMS and USPSTF guidelines and agreed to schedule her next follow up appointment today.  Otis Peak, am acting as transcriptionist for [name], PA-C  I, [name], PA-C have reviewed above note and agree with its content

## 2018-11-11 ENCOUNTER — Telehealth (INDEPENDENT_AMBULATORY_CARE_PROVIDER_SITE_OTHER): Payer: Self-pay

## 2018-11-11 LAB — HEMOGLOBIN A1C
Est. average glucose Bld gHb Est-mCnc: 108 mg/dL
Hgb A1c MFr Bld: 5.4 % (ref 4.8–5.6)

## 2018-11-11 LAB — COMPREHENSIVE METABOLIC PANEL
ALT: 22 IU/L (ref 0–32)
AST: 24 IU/L (ref 0–40)
Albumin/Globulin Ratio: 1.9 (ref 1.2–2.2)
Albumin: 4.2 g/dL (ref 3.7–4.7)
Alkaline Phosphatase: 46 IU/L (ref 39–117)
BUN/Creatinine Ratio: 23 (ref 12–28)
BUN: 21 mg/dL (ref 8–27)
Bilirubin Total: 0.3 mg/dL (ref 0.0–1.2)
CO2: 27 mmol/L (ref 20–29)
Calcium: 9.1 mg/dL (ref 8.7–10.3)
Chloride: 98 mmol/L (ref 96–106)
Creatinine, Ser: 0.91 mg/dL (ref 0.57–1.00)
GFR calc Af Amer: 73 mL/min/{1.73_m2} (ref >59)
GFR calc non Af Amer: 63 mL/min/{1.73_m2} (ref >59)
Globulin, Total: 2.2 g/dL (ref 1.5–4.5)
Glucose: 109 mg/dL — ABNORMAL HIGH (ref 65–99)
Potassium: 3.8 mmol/L (ref 3.5–5.2)
Sodium: 138 mmol/L (ref 134–144)
Total Protein: 6.4 g/dL (ref 6.0–8.5)

## 2018-11-11 LAB — VITAMIN D 25 HYDROXY (VIT D DEFICIENCY, FRACTURES): Vit D, 25-Hydroxy: 90.2 ng/mL (ref 30.0–100.0)

## 2018-11-11 LAB — INSULIN, RANDOM: INSULIN: 33 u[IU]/mL — ABNORMAL HIGH (ref 2.6–24.9)

## 2018-11-11 NOTE — Telephone Encounter (Signed)
Spoke with the patient and informed her to hold her Vit D at this time per PA Linus Orn after her recent labs were reviewed. Patient verbalized understanding. Crystal Jackson, [place]

## 2018-11-11 NOTE — Progress Notes (Signed)
Please have her d/c her Vit D for now . Thanks

## 2018-11-12 NOTE — Progress Notes (Signed)
Office: [phone]  /  Fax: [phone]    Date: November 24, 2018   Appointment Start Time: 10:34am Duration: 23 minutes Provider: Glennie Isle, Psy.D. Type of Session: Individual Therapy  Location of Patient: Home Location of Provider: [facility] Type of Contact: Telepsychological Visit via [employer] WebEx   Session Content: Crystal Jackson is a 73 y.o. female presenting via [place] for a follow-up appointment to address the previously established treatment goal of decreasing emotional eating. Today's appointment was a telepsychological visit, as it is an option for appointments to reduce exposure to COVID-19. Crystal Jackson expressed understanding regarding the rationale for telepsychological services, and provided verbal consent for today's appointment. Prior to proceeding with today's appointment, Crystal Jackson's physical location at the time of this appointment was obtained. In the event of technical difficulties, Crystal Jackson shared a phone number she could be reached at. Crystal Jackson and this provider participated in today's telepsychological service. Also, Crystal Jackson denied anyone else being present in the room or on the WebEx appointment. Of note, this provider called Crystal Jackson at 10:32am as she did not present for the [facility] appointment. Crystal Jackson shared her computer was "being ugly" and she was in the process of trying to connect to the WebEx appointment. As such, today's appointment was initiated 4 minutes late.   This provider conducted a brief check-in and verbally administered the PHQ-9 and GAD-7. Crystal Jackson reported feeling "more than concerned" regarding COVID-19, which she feels is impacting her eating. She acknowledged eating out and deviating from the meal plan. Nevertheless, Crystal Jackson stated she engaged in learned skills to help with coping; positive reinforcement was provided. She continues to report a reduction in emotional eating since the onset of treatment with this provider. Furthermore, a  plan was developed to help Crystal Jackson cope with emotional eating in the future using learned skills. She wrote down the following plan: focus on hydration; be prepared with snacks congruent to the meal plan, especially when traveling, running errands, at work, etc.; pause to ask questions when triggered to eat (e.g., Am I really hungry?; Is there something bothering me? Will I feel better if I eat?); and engage in learned coping skills (e.g., mindfulness, pleasurable activities) after going through the aforementioned questions. Following today's appointment, Crystal Jackson noted a plan to organize an area for snacks in her kitchen and organize all handout shared. Crystal Jackson was receptive to today's session as evidenced by openness to sharing, responsiveness to feedback, and willingness to continue engaging in learned skills.  Mental Status Examination:  Appearance: neat Behavior: cooperative Mood: euthymic Affect: mood congruent Speech: normal in rate, volume, and tone Eye Contact: appropriate Psychomotor Activity: appropriate Thought Process: linear, logical, and goal directed  Content/Perceptual Disturbances: no hallucinations, delusions, bizarre thinking or behavior reported or observed and no evidence of suicidal and homicidal ideation, plan, and intent Orientation: time, person, place and purpose of appointment Cognition/Sensorium: memory, attention, language, and fund of knowledge intact  Insight: good Judgment: good  Structured Assessment Results: The Patient Health Questionnaire-9 (PHQ-9) is a self-report measure that assesses symptoms and severity of depression over the course of the last two weeks. Crystal Jackson obtained a score of 4 suggesting minimal depression. Crystal Jackson finds the endorsed symptoms to be somewhat difficult. Crystal Jackson 0  Feeling down, depressed, or hopeless 0  Trouble falling or staying asleep, or sleeping too much 0  Feeling tired or having Crystal  energy 0  Poor appetite or overeating 2  Feeling bad about yourself --- or that you are  a failure or have let yourself or your family down 0  Trouble concentrating on Jackson, such as reading the newspaper or watching television 0  Moving or speaking so slowly that other people could have noticed? Or the opposite --- being so fidgety or restless that you have been moving around a lot more than usual 2  Thoughts that you would be better off dead or hurting yourself in some way 0  PHQ-9 Score 4    The Generalized Anxiety Disorder-7 (GAD-7) is a brief self-report measure that assesses symptoms of anxiety over the course of the last two weeks. Crystal Jackson obtained a score of 1 suggesting minimal anxiety. Crystal Jackson finds the endorsed symptoms to be somewhat difficult. Feeling nervous, anxious, on edge 0  Not being able to stop or control worrying 0  Worrying too much about different Jackson 0  Trouble relaxing 0  Being so restless that it's hard to sit still 0  Becoming easily annoyed or irritable 1  Feeling afraid as if something awful might happen 0  GAD-7 Score 1   Interventions:  Conducted a brief chart review Verbal administration of PHQ-9 and GAD-7 for symptom monitoring Provided empathic reflections and validation Employed motivational interviewing skills to assess patient's willingness/desire to adhere to recommended medical treatments and assignments Reviewed learned skills Employed supportive psychotherapy interventions to facilitate reduced distress, and to improve coping skills with identified stressors  DSM-5 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 Crystal Jackson demonstrated progress in her goal as evidenced by increased awareness of hunger patterns and triggers for emotional eating. Crystal Jackson  also reported a reduction in emotional eating and continues to demonstrate willingness to engage in learned skill(s).  Plan: As previously planned, today was Crystal Jackson's last appointment with this provider. She noted, "I feel much better." She acknowledged understanding that she may request a follow-up appointment with this provider in the future as long as she is still established with the clinic. No further follow-up planned by this provider.

## 2018-11-12 NOTE — Progress Notes (Signed)
Spoke with pt informed to stop Vit D she verbalized understanding. Renee Ramus, LPN

## 2018-11-17 ENCOUNTER — Ambulatory Visit: Payer: Self-pay

## 2018-11-17 ENCOUNTER — Ambulatory Visit: Payer: Medicare Other | Admitting: Physical Medicine and Rehabilitation

## 2018-11-17 ENCOUNTER — Other Ambulatory Visit: Payer: Self-pay

## 2018-11-17 ENCOUNTER — Encounter: Payer: Self-pay | Admitting: Physical Medicine and Rehabilitation

## 2018-11-17 VITALS — BP 129/73 | HR 68

## 2018-11-17 DIAGNOSIS — M47816 Spondylosis without myelopathy or radiculopathy, lumbar region: Secondary | ICD-10-CM

## 2018-11-17 MED ORDER — METHYLPREDNISOLONE ACETATE 80 MG/ML IJ SUSP
80.0000 mg | Freq: Once | INTRAMUSCULAR | Status: AC
  Administered 2018-11-17: 14:00:00 80 mg

## 2018-11-17 NOTE — Progress Notes (Signed)
 .  Numeric Pain Rating Scale and Functional Assessment Average Pain 6   In the last MONTH (on 0-10 scale) has pain interfered with the following?  1. General activity like being  able to carry out your everyday physical activities such as walking, climbing stairs, carrying groceries, or moving a chair?  Rating(7)   +Driver, -BT, -Dye Allergies.  

## 2018-11-24 ENCOUNTER — Other Ambulatory Visit: Payer: Self-pay

## 2018-11-24 ENCOUNTER — Ambulatory Visit (INDEPENDENT_AMBULATORY_CARE_PROVIDER_SITE_OTHER): Payer: Medicare Other | Admitting: Psychology

## 2018-11-24 DIAGNOSIS — F3341 Major depressive disorder, recurrent, in partial remission: Secondary | ICD-10-CM | POA: Diagnosis not present

## 2018-12-02 ENCOUNTER — Other Ambulatory Visit: Payer: Self-pay

## 2018-12-02 ENCOUNTER — Ambulatory Visit (INDEPENDENT_AMBULATORY_CARE_PROVIDER_SITE_OTHER): Payer: Medicare Other | Admitting: Physician Assistant

## 2018-12-02 ENCOUNTER — Encounter (INDEPENDENT_AMBULATORY_CARE_PROVIDER_SITE_OTHER): Payer: Self-pay | Admitting: Physician Assistant

## 2018-12-02 VITALS — BP 108/70 | HR 85 | Temp 97.9°F | Ht 64.0 in | Wt 220.0 lb

## 2018-12-02 DIAGNOSIS — R7303 Prediabetes: Secondary | ICD-10-CM

## 2018-12-02 DIAGNOSIS — Z6837 Body mass index (BMI) 37.0-37.9, adult: Secondary | ICD-10-CM

## 2018-12-03 NOTE — Progress Notes (Signed)
Office: [phone]  /  Fax: [phone]   HPI:   Chief Complaint: OBESITY [name] is here to discuss her progress with her obesity treatment plan. She is on the Category 2 plan and is following her eating plan approximately 75 % of the time. She states she is exercising 0 minutes 0 times per week. [name] reports that her hunger is well controlled. She is getting up a little later in the morning, which pushes her meal times back. She wants to talk about strategies for that. Her weight is 220 lb (99.8 kg) today and has had a weight loss of 8 pounds over a period of 3 weeks since her last visit. She has lost 22 lbs since starting treatment with us.  Pre-Diabetes [name] has a diagnosis of prediabetes based on her elevated Hgb A1c and was informed this puts her at greater risk of developing diabetes. [name] is not on medications. Her last A1c was at 5.4 (11/10/18). She continues to work on diet and exercise to decrease risk of diabetes. She denies polyphagia.  ASSESSMENT AND PLAN:  Prediabetes  Class 2 severe obesity with serious comorbidity and body mass index (BMI) of 37.0 to 37.9 in adult, unspecified obesity type [facility](HCC)  PLAN:  Pre-Diabetes [name] will continue to work on weight loss, exercise, and decreasing simple carbohydrates in her diet to help decrease the risk of diabetes. She was informed that eating too many simple carbohydrates or too many calories at one sitting increases the likelihood of GI side effects. [name] will continue with the meal plan and follow up with us as directed to monitor her progress.  Obesity [name] is currently in the action stage of change. As such, her goal is to continue with weight loss efforts She has agreed to follow the Category 2 plan [name] has been instructed to work up to a goal of 150 minutes of combined cardio and strengthening exercise per week for weight loss and overall health benefits. We discussed the following Behavioral  Modification Strategies today: planning for success and work on meal planning and easy cooking plans  [name] has agreed to follow up with our clinic in 2 weeks. She was informed of the importance of frequent follow up visits to maximize her success with intensive lifestyle modifications for her multiple health conditions.  I spent > than 50% of the 25 minute visit on counseling as documented in the note.   ALLERGIES: Allergies  Allergen Reactions   Avapro [Irbesartan] Shortness Of Breath   Dextromethorphan Hives and Shortness Of Breath   Guaifenesin & Derivatives Hives and Shortness Of Breath   Valtrex [Valacyclovir] Hives and Shortness Of Breath   Citalopram     Other reaction(s): Chills (intolerance)    Codeine Nausea And Vomiting   Duloxetine     Feel like in a fog Other reaction(s): Other (See Comments) Feel like in a fog    Penicillin G Rash   Vilazodone Nausea Only    MEDICATIONS: Current Outpatient Medications on File Prior to Visit  Medication Sig Dispense Refill   albuterol (PROAIR HFA) 108 (90 Base) MCG/ACT inhaler Inhale into the lungs.     ARNUITY ELLIPTA 200 MCG/ACT AEPB INHALE 1 PUFF INTO THE LUNGS DAILY 30 each 5   Ascorbic Acid (VITAMIN C) 100 MG tablet Take 100 mg by mouth daily.     Calcium Carbonate-Vitamin D (CALCIUM-VITAMIN D) 500-200 MG-UNIT tablet Take by mouth.     chlorpheniramine (CHLOR-TRIMETON) 4 MG tablet Take 1 tablet (4 mg total)  by mouth every 6 (six) hours. 120 tablet 0   chlorthalidone (HYGROTON) 25 MG tablet Take 0.5 tablets (12.5 mg total) by mouth daily. 30 tablet 0   clotrimazole-betamethasone (LOTRISONE) cream Qd for rash     diclofenac (VOLTAREN) 75 MG EC tablet TAKE 1 TABLET BY MOUTH TWICE DAILY WITH FOOD FOR 2 WEEKS THEN AS NEEDED AFTER THAT 180 tablet 0   dicyclomine (BENTYL) 20 MG tablet Take 20 mg by mouth 4 (four) times daily -  before meals and at bedtime.      doxazosin (CARDURA) 1 MG tablet Take 1 mg by mouth  daily.     estradiol (ESTRACE) 1 MG tablet Take 1 mg by mouth daily.     gabapentin (NEURONTIN) 100 MG capsule TK 1 TO 2 CS PO HS  11   metoprolol tartrate (LOPRESSOR) 25 MG tablet Take 25-50 mg by mouth 2 (two) times daily. 25 mg in the am and 50 mg at night     METRONIDAZOLE, TOPICAL, (METROLOTION) 0.75 % LOTN Apply topically.     Multiple Vitamin (MULTIVITAMIN) tablet Take 1 tablet by mouth daily.      Omega-3 Fatty Acids (FISH OIL) 500 MG CAPS Take by mouth.     pantoprazole (PROTONIX) 40 MG tablet Take 40 mg by mouth daily.      potassium chloride (K-DUR) 10 MEQ tablet TAKE 2 TABLETS BY MOUTH TWICE DAILY     SUPER B COMPLEX/C PO Take by mouth.     VALACYCLOVIR HCL PO Take 500 mg by mouth 2 (two) times daily. For 3 days prn     venlafaxine XR (EFFEXOR-XR) 75 MG 24 hr capsule TK 3 CS PO D  3   No current facility-administered medications on file prior to visit.     PAST MEDICAL HISTORY: Past Medical History:  Diagnosis Date   Anxiety    Arthritis    Asthma    Back pain    Bilateral swelling of feet    Constipation    Depression    Gallbladder problem    GERD (gastroesophageal reflux disease)    History of low potassium    Hypertension    Joint pain    Obesity    Palpitations    Shortness of breath    Sleep apnea     PAST SURGICAL HISTORY: Past Surgical History:  Procedure Laterality Date   ABDOMINAL HYSTERECTOMY     APPENDECTOMY     CHOLECYSTECTOMY     KNEE SURGERY Left    SHOULDER SURGERY Right     SOCIAL HISTORY: Social History   Tobacco Use   Smoking status: Never Smoker   Smokeless tobacco: Never Used  Substance Use Topics   Alcohol use: No    Alcohol/week: 0.0 standard drinks   Drug use: No    FAMILY HISTORY: Family History  Problem Relation Age of Onset   Diabetes Mother    Heart disease Mother    Anxiety disorder Mother    Obesity Mother    Diabetes Father    Hypertension Father    Obesity Father      ROS: Review of Systems  Constitutional: Positive for weight loss.  Endo/Heme/Allergies:       Negative for polyphagia    PHYSICAL EXAM: Blood pressure 108/70, pulse 85, temperature 97.9 F (36.6 C), temperature source Oral, height 5\' 4"  (1.626 m), weight 220 lb (99.8 kg), SpO2 97 %. Body mass index is 37.76 kg/m. Physical Exam Vitals signs reviewed.  Constitutional:  Appearance: Normal appearance. She is well-developed. She is obese.  Cardiovascular:     Rate and Rhythm: Normal rate.  Pulmonary:     Effort: Pulmonary effort is normal.  Musculoskeletal: Normal range of motion.  Skin:    General: Skin is warm and dry.  Neurological:     Mental Status: She is alert and oriented to person, place, and time.  Psychiatric:        Mood and Affect: Mood normal.        Behavior: Behavior normal.     RECENT LABS AND TESTS: BMET    Component Value Date/Time   NA 138 11/10/2018 1000   K 3.8 11/10/2018 1000   CL 98 11/10/2018 1000   CO2 27 11/10/2018 1000   GLUCOSE 109 (H) 11/10/2018 1000   GLUCOSE 100 (H) 10/24/2015 1306   BUN 21 11/10/2018 1000   CREATININE 0.91 11/10/2018 1000   CALCIUM 9.1 11/10/2018 1000   GFRNONAA 63 11/10/2018 1000   GFRAA 73 11/10/2018 1000   Lab Results  Component Value Date   HGBA1C 5.4 11/10/2018   HGBA1C 5.8 (H) 07/09/2018   Lab Results  Component Value Date   INSULIN 33.0 (H) 11/10/2018   INSULIN 25.0 (H) 07/09/2018   CBC    Component Value Date/Time   WBC 6.0 07/09/2018 1609   WBC 5.6 10/24/2015 1306   RBC 4.48 07/09/2018 1609   RBC 4.17 10/24/2015 1306   HGB 13.6 07/09/2018 1609   HCT 39.7 07/09/2018 1609   PLT 241 07/09/2018 1609   MCV 89 07/09/2018 1609   MCH 30.4 07/09/2018 1609   MCH 29.0 10/24/2015 1306   MCHC 34.3 07/09/2018 1609   MCHC 33.5 10/24/2015 1306   RDW 13.1 07/09/2018 1609   LYMPHSABS 2.0 07/09/2018 1609   MONOABS 0.4 10/24/2015 1306   EOSABS 0.2 07/09/2018 1609   BASOSABS 0.0 07/09/2018 1609    Iron/TIBC/Ferritin/ %Sat No results found for: IRON, TIBC, FERRITIN, IRONPCTSAT Lipid Panel     Component Value Date/Time   CHOL 158 07/09/2018 1609   TRIG 92 07/09/2018 1609   HDL 54 07/09/2018 1609   LDLCALC 86 07/09/2018 1609   Hepatic Function Panel     Component Value Date/Time   PROT 6.4 11/10/2018 1000   ALBUMIN 4.2 11/10/2018 1000   AST 24 11/10/2018 1000   ALT 22 11/10/2018 1000   ALKPHOS 46 11/10/2018 1000   BILITOT 0.3 11/10/2018 1000      Component Value Date/Time   TSH 2.210 07/09/2018 1609     Ref. Range 11/10/2018 10:00  Vitamin D, 25-Hydroxy Latest Ref Range: 30.0 - 100.0 ng/mL 90.2    OBESITY BEHAVIORAL INTERVENTION VISIT  Today's visit was # 10   Starting weight: 242 lbs Starting date: 07/09/2018 Today's weight : 220 lbs Today's date: 12/02/2018 Total lbs lost to date: 22    12/02/2018  Height  (1.626 m)  Weight 220 lb (99.8 kg)  BMI (Calculated) 37.74  BLOOD PRESSURE - SYSTOLIC 108  BLOOD PRESSURE - DIASTOLIC 70   Body Fat % 47.8 %  Total Body Water (lbs) 74.6 lbs    ASK: We discussed the diagnosis of obesity with Neil Crouch today and Neelie agreed to give [name] permission to discuss obesity behavioral modification therapy today.  ASSESS: Anabell has the diagnosis of obesity and her BMI today is 37.74 Emerly is in the action stage of change   ADVISE: Renesmee was educated on the multiple health risks of obesity as well as the  benefit of weight loss to improve her health. She was advised of the need for long term treatment and the importance of lifestyle modifications to improve her current health and to decrease her risk of future health problems.  AGREE: Multiple dietary modification options and treatment options were discussed and  Krina agreed to follow the recommendations documented in the above note.  ARRANGE: Duha was educated on the importance of frequent visits to treat obesity as outlined per CMS and USPSTF  guidelines and agreed to schedule her next follow up appointment today.  Corey Skains, am acting as transcriptionist for Abby Potash, PA-C I, Abby Potash, PA-C have reviewed above note and agree with its content

## 2018-12-04 ENCOUNTER — Telehealth: Payer: Self-pay | Admitting: Pulmonary Disease

## 2018-12-04 MED ORDER — ARNUITY ELLIPTA 200 MCG/ACT IN AEPB: 1.0000 | INHALATION_SPRAY | Freq: Every day | RESPIRATORY_TRACT | 10 refills | Status: DC

## 2018-12-04 NOTE — Telephone Encounter (Signed)
Rx for arnuity was refilled  Spoke with the pt and notified that this was sent Nothing further needed

## 2018-12-17 ENCOUNTER — Ambulatory Visit: Payer: Medicare Other | Admitting: Physician Assistant

## 2018-12-18 ENCOUNTER — Ambulatory Visit (INDEPENDENT_AMBULATORY_CARE_PROVIDER_SITE_OTHER): Payer: Medicare Other | Admitting: Physician Assistant

## 2018-12-18 ENCOUNTER — Encounter (INDEPENDENT_AMBULATORY_CARE_PROVIDER_SITE_OTHER): Payer: Self-pay | Admitting: Physician Assistant

## 2018-12-18 ENCOUNTER — Other Ambulatory Visit: Payer: Self-pay

## 2018-12-18 VITALS — BP 112/71 | HR 81 | Temp 98.2°F | Ht 64.0 in | Wt 222.0 lb

## 2018-12-18 DIAGNOSIS — R7303 Prediabetes: Secondary | ICD-10-CM | POA: Diagnosis not present

## 2018-12-18 DIAGNOSIS — Z6838 Body mass index (BMI) 38.0-38.9, adult: Secondary | ICD-10-CM | POA: Diagnosis not present

## 2018-12-18 DIAGNOSIS — E559 Vitamin D deficiency, unspecified: Secondary | ICD-10-CM

## 2018-12-23 NOTE — Progress Notes (Signed)
Office: [phone]  /  Fax: [phone]   HPI:  Chief Complaint: OBESITY Crystal Jackson is here to discuss her progress with her obesity treatment plan. She is on the Category 2 plan and states she is following her eating plan approximately 50 % of the time. She states she is exercising 0 minutes 0 times per week.  Crystal Jackson reports that the last few weeks have been more challenging, due to her friends bringing her cookies. She states that she is going to throw out the rest of them. She is asking about other snack choices.  Vitamin D deficiency Crystal Jackson has a diagnosis of vitamin D deficiency. She is on calcium with viatmin D. Crystal Jackson has no nausea, vomiting or muscle weakness. She is at risk for over-supplementation. Her last level was at goal.   Pre-Diabetes Crystal Jackson has a diagnosis of prediabetes. Her last A1c was at 5.4 (11/10/18), and is down from 5.8 (07/09/18). She is not on medications. Crystal Jackson has no polyphagia.   Today's visit was # 11  Starting weight: 242 lbs Starting date: 07/09/2018 Today's weight : 222 lbs Today's date: 12/18/2018 Total lbs lost to date: 20 Total lbs lost since last in-office visit: 0  ASSESSMENT AND PLAN:  Vitamin D deficiency  Prediabetes  Class 2 severe obesity with serious comorbidity and body mass index (BMI) of 38.0 to 38.9 in adult, unspecified obesity type (HCC)  PLAN:  Vitamin D Deficiency Low vitamin D level contributes to fatigue and are associated with obesity, breast, and colon cancer. Crystal Jackson will continue with OTC medication and she will follow up for routine testing of vitamin D, at least 2-3 times per year to avoid over-replacement.  Pre-Diabetes Crystal Jackson will continue to work on weight loss, exercise, and decreasing simple carbohydrates to help decrease the risk of diabetes.   Obesity Crystal Jackson is currently in the action stage of change. As such, her goal is to continue with weight loss efforts She has agreed to follow the Category 2  plan Crystal Jackson has been instructed to work up to a goal of 150 minutes of combined cardio and strengthening exercise per week for weight loss and overall health benefits. We discussed the following Behavioral Modification Strategies today: better snacking choices and work on meal planning and easy cooking plans  Crystal Jackson has agreed to follow up with our clinic in 3 weeks. She was informed of the importance of frequent follow up visits to maximize her success with intensive lifestyle modifications for her multiple health conditions.  ALLERGIES: Allergies  Allergen Reactions  . Avapro [Irbesartan] Shortness Of Breath  . Dextromethorphan Hives and Shortness Of Breath  . Guaifenesin & Derivatives Hives and Shortness Of Breath  . Valtrex [Valacyclovir] Hives and Shortness Of Breath  . Citalopram     Other reaction(s): Chills (intolerance)   . Codeine Nausea And Vomiting  . Duloxetine     Feel like in a fog Other reaction(s): Other (See Comments) Feel like in a fog   . Penicillin G Rash  . Vilazodone Nausea Only    MEDICATIONS: Current Outpatient Medications on File Prior to Visit  Medication Sig Dispense Refill  . albuterol (PROAIR HFA) 108 (90 Base) MCG/ACT inhaler Inhale into the lungs.    . Ascorbic Acid (VITAMIN C) 100 MG tablet Take 100 mg by mouth daily.    . Calcium Carbonate-Vitamin D (CALCIUM-VITAMIN D) 500-200 MG-UNIT tablet Take by mouth.    . chlorpheniramine (CHLOR-TRIMETON) 4 MG tablet Take 1 tablet (4 mg total) by mouth every 6 (six)  hours. 120 tablet 0  . chlorthalidone (HYGROTON) 25 MG tablet Take 0.5 tablets (12.5 mg total) by mouth daily. 30 tablet 0  . clotrimazole-betamethasone (LOTRISONE) cream Qd for rash    . diclofenac (VOLTAREN) 75 MG EC tablet TAKE 1 TABLET BY MOUTH TWICE DAILY WITH FOOD FOR 2 WEEKS THEN AS NEEDED AFTER THAT 180 tablet 0  . dicyclomine (BENTYL) 20 MG tablet Take 20 mg by mouth 4 (four) times daily -  before meals and at bedtime.     Marland Kitchen doxazosin  (CARDURA) 1 MG tablet Take 1 mg by mouth daily.    Marland Kitchen estradiol (ESTRACE) 1 MG tablet Take 1 mg by mouth daily.    . Fluticasone Furoate (ARNUITY ELLIPTA) 200 MCG/ACT AEPB Inhale 1 puff into the lungs daily. 30 each 10  . gabapentin (NEURONTIN) 100 MG capsule TK 1 TO 2 CS PO HS  11  . metoprolol tartrate (LOPRESSOR) 25 MG tablet Take 25-50 mg by mouth 2 (two) times daily. 25 mg in the am and 50 mg at night    . METRONIDAZOLE, TOPICAL, (METROLOTION) 0.75 % LOTN Apply topically.    . Multiple Vitamin (MULTIVITAMIN) tablet Take 1 tablet by mouth daily.     . Omega-3 Fatty Acids (FISH OIL) 500 MG CAPS Take by mouth.    . pantoprazole (PROTONIX) 40 MG tablet Take 40 mg by mouth daily.     . potassium chloride (K-DUR) 10 MEQ tablet TAKE 2 TABLETS BY MOUTH TWICE DAILY    . SUPER B COMPLEX/C PO Take by mouth.    Marland Kitchen VALACYCLOVIR HCL PO Take 500 mg by mouth 2 (two) times daily. For 3 days prn    . venlafaxine XR (EFFEXOR-XR) 75 MG 24 hr capsule TK 3 CS PO D  3   No current facility-administered medications on file prior to visit.    PAST MEDICAL HISTORY: Past Medical History:  Diagnosis Date  . Anxiety   . Arthritis   . Asthma   . Back pain   . Bilateral swelling of feet   . Constipation   . Depression   . Gallbladder problem   . GERD (gastroesophageal reflux disease)   . History of low potassium   . Hypertension   . Joint pain   . Obesity   . Palpitations   . Shortness of breath   . Sleep apnea     PAST SURGICAL HISTORY: Past Surgical History:  Procedure Laterality Date  . ABDOMINAL HYSTERECTOMY    . APPENDECTOMY    . CHOLECYSTECTOMY    . KNEE SURGERY Left   . SHOULDER SURGERY Right     SOCIAL HISTORY: Social History   Tobacco Use  . Smoking status: Never Smoker  . Smokeless tobacco: Never Used  Substance Use Topics  . Alcohol use: No    Alcohol/week: 0.0 standard drinks  . Drug use: No    FAMILY HISTORY: Family History  Problem Relation Age of Onset  . Diabetes  Mother   . Heart disease Mother   . Anxiety disorder Mother   . Obesity Mother   . Diabetes Father   . Hypertension Father   . Obesity Father     ROS: Review of Systems  Constitutional: Negative for weight loss.  Gastrointestinal: Negative for nausea and vomiting.  Musculoskeletal:       Negative for muscle weakness  Endo/Heme/Allergies:       Negative for polyphagia    PHYSICAL EXAM: Blood pressure 112/71, pulse 81, temperature 98.2 F (36.8  C), temperature source Oral, height 5\' 4"  (1.626 m), weight 222 lb (100.7 kg), SpO2 98 %. Body mass index is 38.11 kg/m. Physical Exam Vitals reviewed.  Constitutional:      General: She is not in acute distress.    Appearance: Normal appearance. She is well-developed. She is obese.  Cardiovascular:     Rate and Rhythm: Normal rate.  Pulmonary:     Effort: Pulmonary effort is normal.  Musculoskeletal:        General: Normal range of motion.  Skin:    General: Skin is warm and dry.  Neurological:     Mental Status: She is alert and oriented to person, place, and time.  Psychiatric:        Mood and Affect: Mood normal.        Behavior: Behavior normal.     RECENT LABS AND TESTS: BMET    Component Value Date/Time   NA 138 11/10/2018 1000   K 3.8 11/10/2018 1000   CL 98 11/10/2018 1000   CO2 27 11/10/2018 1000   GLUCOSE 109 (H) 11/10/2018 1000   GLUCOSE 100 (H) 10/24/2015 1306   BUN 21 11/10/2018 1000   CREATININE 0.91 11/10/2018 1000   CALCIUM 9.1 11/10/2018 1000   GFRNONAA 63 11/10/2018 1000   GFRAA 73 11/10/2018 1000   Lab Results  Component Value Date   HGBA1C 5.4 11/10/2018   HGBA1C 5.8 (H) 07/09/2018   Lab Results  Component Value Date   INSULIN 33.0 (H) 11/10/2018   INSULIN 25.0 (H) 07/09/2018   CBC    Component Value Date/Time   WBC 6.0 07/09/2018 1609   WBC 5.6 10/24/2015 1306   RBC 4.48 07/09/2018 1609   RBC 4.17 10/24/2015 1306   HGB 13.6 07/09/2018 1609   HCT 39.7 07/09/2018 1609   PLT 241  07/09/2018 1609   MCV 89 07/09/2018 1609   MCH 30.4 07/09/2018 1609   MCH 29.0 10/24/2015 1306   MCHC 34.3 07/09/2018 1609   MCHC 33.5 10/24/2015 1306   RDW 13.1 07/09/2018 1609   LYMPHSABS 2.0 07/09/2018 1609   MONOABS 0.4 10/24/2015 1306   EOSABS 0.2 07/09/2018 1609   BASOSABS 0.0 07/09/2018 1609   Iron/TIBC/Ferritin/ %Sat No results found for: IRON, TIBC, FERRITIN, IRONPCTSAT Lipid Panel     Component Value Date/Time   CHOL 158 07/09/2018 1609   TRIG 92 07/09/2018 1609   HDL 54 07/09/2018 1609   LDLCALC 86 07/09/2018 1609   Hepatic Function Panel     Component Value Date/Time   PROT 6.4 11/10/2018 1000   ALBUMIN 4.2 11/10/2018 1000   AST 24 11/10/2018 1000   ALT 22 11/10/2018 1000   ALKPHOS 46 11/10/2018 1000   BILITOT 0.3 11/10/2018 1000      Component Value Date/Time   TSH 2.210 07/09/2018 1609     OBESITY BEHAVIORAL INTERVENTION VISIT DOCUMENTATION FOR INSURANCE (~15 minutes)    ASK: We discussed the diagnosis of obesity with Neil Crouchebecca Kobs today and [name] agreed to give us permission to discuss obesity behavioral modification therapy today.  ASSESS: [name] has the diagnosis of obesity and her BMI today is 38.09 [name] is in the action stage of change   ADVISE: [name] was educated on the multiple health risks of obesity as well as the benefit of weight loss to improve her health. She was advised of the need for long term treatment and the importance of lifestyle modifications to improve her current health and to decrease her risk of future health  problems.  AGREE: Multiple dietary modification options and treatment options were discussed and  Cheryln agreed to follow the recommendations documented in the above note.  ARRANGE: Chanie was educated on the importance of frequent visits to treat obesity as outlined per CMS and USPSTF guidelines and agreed to schedule her next follow up appointment today.   Corey Skains, am acting as  transcriptionist for Abby Potash, PA-C I, Abby Potash, PA-C have reviewed above note and agree with its content

## 2019-01-01 ENCOUNTER — Ambulatory Visit: Payer: Medicare Other | Admitting: Physician Assistant

## 2019-01-12 ENCOUNTER — Other Ambulatory Visit (INDEPENDENT_AMBULATORY_CARE_PROVIDER_SITE_OTHER): Payer: Self-pay | Admitting: Physician Assistant

## 2019-01-12 ENCOUNTER — Ambulatory Visit (INDEPENDENT_AMBULATORY_CARE_PROVIDER_SITE_OTHER): Payer: Medicare Other | Admitting: Physician Assistant

## 2019-01-12 ENCOUNTER — Other Ambulatory Visit: Payer: Self-pay

## 2019-01-12 ENCOUNTER — Encounter (INDEPENDENT_AMBULATORY_CARE_PROVIDER_SITE_OTHER): Payer: Self-pay | Admitting: Physician Assistant

## 2019-01-12 VITALS — BP 92/59 | HR 69 | Temp 98.0°F | Ht 64.0 in | Wt 222.0 lb

## 2019-01-12 DIAGNOSIS — I10 Essential (primary) hypertension: Secondary | ICD-10-CM

## 2019-01-12 DIAGNOSIS — R7303 Prediabetes: Secondary | ICD-10-CM | POA: Diagnosis not present

## 2019-01-12 DIAGNOSIS — Z6838 Body mass index (BMI) 38.0-38.9, adult: Secondary | ICD-10-CM | POA: Diagnosis not present

## 2019-01-12 MED ORDER — CHLORTHALIDONE 25 MG PO TABS: 12.5000 mg | ORAL_TABLET | Freq: Every day | ORAL | 0 refills | Status: DC

## 2019-01-14 ENCOUNTER — Ambulatory Visit: Payer: Medicare Other | Admitting: Physician Assistant

## 2019-01-14 ENCOUNTER — Encounter: Payer: Self-pay | Admitting: Physician Assistant

## 2019-01-14 ENCOUNTER — Other Ambulatory Visit: Payer: Self-pay

## 2019-01-14 DIAGNOSIS — M545 Low back pain, unspecified: Secondary | ICD-10-CM

## 2019-01-14 NOTE — Progress Notes (Signed)
Chief Complaint:   OBESITY Crystal Jackson is here to discuss her progress with her obesity treatment plan along with follow-up of her obesity related diagnoses. Crystal Jackson is on the Category 2 Plan and states she is following her eating plan approximately 50% of the time. Crystal Jackson states she is exercising 0 minutes 0 times per week.  Today's visit was #: 12 Starting weight: 242 lbs Starting date: 07/09/2018 Today's weight: 222 lbs Today's date: 01/12/2019 Total lbs lost to date: 20 Total lbs lost since last in-office visit: 0  Interim History: Crystal Jackson reports that she was off track during the holidays and she is having a hard time getting back on track since then. She has not been meal planning.  Subjective:   Essential hypertension  Crystal Jackson is on Chlorthalidone, Metoprolol and Cardura. Her blood pressure is normal. She has no chest pain or headache. Crystal Jackson reports some fatigue today, but she states she has not been drinking enough water.  Prediabetes Crystal Jackson's last A1c was 5.4 (11/10/18). She is not on medications. Crystal Jackson reports intermittent cravings.  Assessment/Plan:   Essential hypertension  Crystal Jackson is working on healthy weight loss and exercise to improve blood pressure control. We will watch for signs of hypotension as she continues her lifestyle modifications. Crystal Jackson agrees to continue Chlorthalidone 25 mg daily #30 with no refills.  Prediabetes Crystal Jackson will start exercise and she will continue to work on weight loss and decreasing simple carbohydrates to help decrease the risk of diabetes.   Obesity Crystal Jackson is currently in the action stage of change. As such, her goal is to continue with weight loss efforts. She has agreed to on the Category 2 Plan.   Crystal Jackson was instructed to walk around the block (10 minute walk) daily.  We discussed the following behavioral modification strategies today: decreasing simple carbohydrates and meal planning and cooking strategies.  Crystal Jackson has agreed to follow-up with our clinic in 2 weeks. She was informed of the importance of frequent follow-up visits to maximize her success with intensive lifestyle modifications for her multiple health conditions.   Objective:   Blood pressure (!) 92/59, pulse 69, temperature 98 F (36.7 C), temperature source Oral, height 5\' 4"  (1.626 m), weight 222 lb (100.7 kg), SpO2 97 %. Body mass index is 38.11 kg/m.  General: Cooperative, alert, well developed, in no acute distress. HEENT: Conjunctivae and lids unremarkable. Neck: No thyromegaly.  Cardiovascular: Regular rhythm.  Lungs: Normal work of breathing. Extremities: No edema.  Neurologic: No focal deficits.   Lab Results  Component Value Date   CREATININE 0.91 11/10/2018   BUN 21 11/10/2018   NA 138 11/10/2018   K 3.8 11/10/2018   CL 98 11/10/2018   CO2 27 11/10/2018   Lab Results  Component Value Date   ALT 22 11/10/2018   AST 24 11/10/2018   ALKPHOS 46 11/10/2018   BILITOT 0.3 11/10/2018   Lab Results  Component Value Date   HGBA1C 5.4 11/10/2018   HGBA1C 5.8 (H) 07/09/2018   Lab Results  Component Value Date   INSULIN 33.0 (H) 11/10/2018   INSULIN 25.0 (H) 07/09/2018   Lab Results  Component Value Date   TSH 2.210 07/09/2018   Lab Results  Component Value Date   CHOL 158 07/09/2018   HDL 54 07/09/2018   LDLCALC 86 07/09/2018   TRIG 92 07/09/2018   Lab Results  Component Value Date   WBC 6.0 07/09/2018   HGB 13.6 07/09/2018   HCT 39.7 07/09/2018  MCV 89 07/09/2018   PLT 241 07/09/2018   No results found for: IRON, TIBC, FERRITIN    Ref. Range 11/10/2018 10:00  Vitamin D, 25-Hydroxy Latest Ref Range: 30.0 - 100.0 ng/mL 90.2    Obesity Behavioral Intervention Documentation for Insurance:   Approximately 15 minutes were spent on the discussion below.  ASK: We discussed the diagnosis of obesity with Crystal Jackson today and Crystal Jackson agreed to give [name] permission to discuss obesity behavioral  modification therapy today.  ASSESS: Crystal Jackson has the diagnosis of obesity and her BMI today is 38.09. Crystal Jackson is in the action stage of change.   ADVISE: Crystal Jackson was educated on the multiple health risks of obesity as well as the benefit of weight loss to improve her health. She was advised of the need for long term treatment and the importance of lifestyle modifications to improve her current health and to decrease her risk of future health problems.  AGREE: Multiple dietary modification options and treatment options were discussed and Crystal Jackson agreed to follow the recommendations documented in the above note.  ARRANGE: Crystal Jackson was educated on the importance of frequent visits to treat obesity as outlined per CMS and USPSTF guidelines and agreed to schedule her next follow up appointment today.  Attestation Statements:   Reviewed by clinician on day of visit: allergies, medications, problem list, medical history, surgical history, family history, social history, and previous encounter notes.  Cristi Loron, am acting as Energy manager for [employer], PA-C.  I have reviewed the above documentation for accuracy and completeness, and I agree with the above. [name], PA-C

## 2019-01-14 NOTE — Progress Notes (Signed)
HPI: Crystal Jackson returns today due to low back pain.  She underwent bilateral L4-5 facet injections 11/17/2018 by Dr. Alvester Morin.  She states that the injections helped after 2 weeks of receiving the injections.  About 3 to 4 weeks ago she began having some achy pain in her lower back.  Begins to ache whenever she been up 30 minutes or so.  She is having no radicular symptoms down the right leg.  Pain starts in the low back area.  She states she feels as if her back is getting tired with standing.  Pain 6-7 out of 10 pain at worst.  She states it is nowhere near as bad as it was prior to the injections.  She has been using Voltaren gel on her back taking ibuprofen and using heat.  Review of systems negative for fevers chills.  Please see HPI otherwise negative  Physical exam: General: Well-developed well-nourished female no acute distress mood and affect appropriate Psych: Alert and oriented x3 Lower extremities: 5 out of 5 strength throughout the lower extremities against resistance.  Negative straight leg raise bilaterally.  Dorsiflexion plantarflexion bilateral ankles intact.  Impression: Low back pain without radicular symptoms  Plan: We will send her to physical therapy for back exercises at home exercise program this is to be a good few visits just to teach her exercise program she can repeat at home.  We will also have her undergo repeat bilateral L4-5 facet injections.  Questions encouraged and answered.  She will follow-up with [name] on an as-needed basis.

## 2019-01-15 ENCOUNTER — Other Ambulatory Visit: Payer: Self-pay | Admitting: Radiology

## 2019-01-15 DIAGNOSIS — M4807 Spinal stenosis, lumbosacral region: Secondary | ICD-10-CM

## 2019-01-20 NOTE — Procedures (Signed)
Lumbar Facet Joint Intra-Articular Injection(s) with Fluoroscopic Guidance  [name]      Date of Birth: Jul 03, 1945 MRN: [id number] PCP: Albertina Senegal, MD      Visit Date: 11/17/2018   Universal Protocol:    Date/Time: 11/17/2018  Consent Given By: the patient  Position: PRONE   Additional Comments: Vital signs were monitored before and after the procedure. Patient was prepped and draped in the usual sterile fashion. The correct patient, procedure, and site was verified.   Injection Procedure Details:  Procedure Site One Meds Administered:  Meds ordered this encounter  Medications  . methylPREDNISolone acetate (DEPO-MEDROL) injection 80 mg     Laterality: Bilateral  Location/Site:  L4-L5  Needle size: 22 guage  Needle type: Spinal  Needle Placement: Articular  Findings:  -Comments: Excellent flow of contrast producing a partial arthrogram.  Procedure Details: The fluoroscope beam is vertically oriented in AP, and the inferior recess is visualized beneath the lower pole of the inferior apophyseal process, which represents the target point for needle insertion. When direct visualization is difficult the target point is located at the medial projection of the vertebral pedicle. The region overlying each aforementioned target is locally anesthetized with a 1 to 2 ml. volume of 1% Lidocaine without Epinephrine.   The spinal needle was inserted into each of the above mentioned facet joints using biplanar fluoroscopic guidance. A 0.25 to 0.5 ml. volume of Isovue-250 was injected and a partial facet joint arthrogram was obtained. A single spot film was obtained of the resulting arthrogram.    One to 1.25 ml of the steroid/anesthetic solution was then injected into each of the facet joints noted above.   Additional Comments:  The patient tolerated the procedure well Dressing: 2 x 2 sterile gauze and Band-Aid    Post-procedure details: Patient was observed  during the procedure. Post-procedure instructions were reviewed.  Patient left the clinic in stable condition.

## 2019-01-20 NOTE — Progress Notes (Signed)
Crystal Jackson - 74 y.o. female MRN [id number]  Date of birth: [birth year]  Office Visit Note: Visit Date: 11/17/2018 PCP: Albertina Senegal, MD Referred by: Albertina Senegal, MD  Subjective: Chief Complaint  Patient presents with  . Lower Back - Pain   HPI: Crystal Jackson is a 74 y.o. female who comes in today At the request of Rexene Edison, PA-C for diagnostic and therapeutic interventional spine procedure.  Patient has pretty significant facet arthropathy at L4-5 with borderline narrowing of the recesses and canal.  No focal nerve compression.  She is having mostly axial low back pain worse with standing and ambulating.  She is tried and failed all other manner of conservative care.  She may be a candidate for radiofrequency ablation.  That would require double diagnostic block paradigm.  ROS Otherwise per HPI.  Assessment & Plan: Visit Diagnoses:  1. Spondylosis without myelopathy or radiculopathy, lumbar region     Plan: No additional findings.   Meds & Orders:  Meds ordered this encounter  Medications  . methylPREDNISolone acetate (DEPO-MEDROL) injection 80 mg    Orders Placed This Encounter  Procedures  . Facet Injection  . XR C-ARM NO REPORT    Follow-up: Return if symptoms worsen or fail to improve.   Procedures: No procedures performed  Lumbar Facet Joint Intra-Articular Injection(s) with Fluoroscopic Guidance  [name]      Date of Birth: 12/28/1945 MRN: [id number] PCP: Albertina Senegal, MD      Visit Date: 11/17/2018   Universal Protocol:    Date/Time: 11/17/2018  Consent Given By: the patient  Position: PRONE   Additional Comments: Vital signs were monitored before and after the procedure. Patient was prepped and draped in the usual sterile fashion. The correct patient, procedure, and site was verified.   Injection Procedure Details:  Procedure Site One Meds Administered:  Meds ordered this encounter  Medications  . methylPREDNISolone  acetate (DEPO-MEDROL) injection 80 mg     Laterality: Bilateral  Location/Site:  L4-L5  Needle size: 22 guage  Needle type: Spinal  Needle Placement: Articular  Findings:  -Comments: Excellent flow of contrast producing a partial arthrogram.  Procedure Details: The fluoroscope beam is vertically oriented in AP, and the inferior recess is visualized beneath the lower pole of the inferior apophyseal process, which represents the target point for needle insertion. When direct visualization is difficult the target point is located at the medial projection of the vertebral pedicle. The region overlying each aforementioned target is locally anesthetized with a 1 to 2 ml. volume of 1% Lidocaine without Epinephrine.   The spinal needle was inserted into each of the above mentioned facet joints using biplanar fluoroscopic guidance. A 0.25 to 0.5 ml. volume of Isovue-250 was injected and a partial facet joint arthrogram was obtained. A single spot film was obtained of the resulting arthrogram.    One to 1.25 ml of the steroid/anesthetic solution was then injected into each of the facet joints noted above.   Additional Comments:  The patient tolerated the procedure well Dressing: 2 x 2 sterile gauze and Band-Aid    Post-procedure details: Patient was observed during the procedure. Post-procedure instructions were reviewed.  Patient left the clinic in stable condition.     Clinical History: MRI LUMBAR SPINE WITHOUT CONTRAST  TECHNIQUE: Multiplanar, multisequence MR imaging of the lumbar spine was performed. No intravenous contrast was administered.  COMPARISON:  Radiographs from 10/01/2018  FINDINGS: Segmentation: The lowest lumbar type non-rib-bearing vertebra is labeled as L5.  Alignment:  2 mm degenerative anterolisthesis at L4-5.  Vertebrae: Cysts fluid signal intensity lesions favoring bilaterally Tarlov at the S2 level and on the left at the S1  level.  Degenerative perifacet edema bilaterally at L4-5 and L5-S1.  Conus medullaris and cauda equina: Conus extends to the L1 level. Conus and cauda equina appear normal.  Paraspinal and other soft tissues: Small common iliac lymph nodes are present.  Disc levels:  T12-L1: No impingement.  Mild disc bulge.  L1-2: Unremarkable.  L2-3: No impingement.  Mild disc bulge.  L3-4: Mild displacement of the right L3 nerve in the lateral extraforaminal space due to right lateral extraforaminal disc protrusion.  L4-5: Borderline left subarticular lateral recess stenosis due to disc bulge and facet arthropathy. Borderline central narrowing of the thecal sac. Bilateral facet joint effusions as on image 30/14.  L5-S1: No impingement. 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 Bilateral degenerative facet arthropathy.  IMPRESSION: 1. Lumbar spondylosis and degenerative disc disease resulting in mild impingement at L3-4. 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 3. Degenerative perifacet edema at L4-5 and L5-S1 with facet joint effusions bilaterally at L4-5.   Electronically Signed   By: Van Clines M.D.   On: 10/10/2018 14:25   She reports that she has never smoked. She has never used smokeless tobacco.  Recent Labs    07/09/18 1609 11/10/18 1000  HGBA1C 5.8* 5.4    Objective:  VS:  HT:    WT:   BMI:     BP:129/73  HR:68bpm  TEMP: ( )  RESP:  Physical Exam  Ortho Exam Imaging: No results found.  Past Medical/Family/Surgical/Social History: Medications & Allergies reviewed per EMR, new medications updated. Patient Active Problem List   Diagnosis Date Noted  . OSA (obstructive sleep apnea) 09/03/2018  . Mild persistent asthma without complication 41/66/0630  . Severe obesity (BMI >= 40) ([place]) 04/14/2018  . Unilateral primary osteoarthritis, left knee 04/14/2018   Past Medical History:  Diagnosis Date  . Anxiety   . Arthritis   . Asthma    . Back pain   . Bilateral swelling of feet   . Constipation   . Depression   . Gallbladder problem   . GERD (gastroesophageal reflux disease)   . History of low potassium   . Hypertension   . Joint pain   . Obesity   . Palpitations   . Shortness of breath   . Sleep apnea    Family History  Problem Relation Age of Onset  . Diabetes Mother   . Heart disease Mother   . Anxiety disorder Mother   . Obesity Mother   . Diabetes Father   . Hypertension Father   . Obesity Father    Past Surgical History:  Procedure Laterality Date  . ABDOMINAL HYSTERECTOMY    . APPENDECTOMY    . CHOLECYSTECTOMY    . KNEE SURGERY Left   . SHOULDER SURGERY Right    Social History   Occupational History  . Not on file  Tobacco Use  . Smoking status: Never Smoker  . Smokeless tobacco: Never Used  Substance and Sexual Activity  . Alcohol use: No    Alcohol/week: 0.0 standard drinks  . Drug use: No  . Sexual activity: Not on file

## 2019-01-22 ENCOUNTER — Encounter: Payer: Self-pay | Admitting: Physical Therapy

## 2019-01-22 ENCOUNTER — Ambulatory Visit: Payer: Medicare Other | Attending: Physician Assistant | Admitting: Physical Therapy

## 2019-01-22 ENCOUNTER — Other Ambulatory Visit: Payer: Self-pay

## 2019-01-22 DIAGNOSIS — M6281 Muscle weakness (generalized): Secondary | ICD-10-CM | POA: Diagnosis present

## 2019-01-22 DIAGNOSIS — M545 Low back pain, unspecified: Secondary | ICD-10-CM

## 2019-01-22 DIAGNOSIS — G8929 Other chronic pain: Secondary | ICD-10-CM | POA: Insufficient documentation

## 2019-01-22 DIAGNOSIS — R262 Difficulty in walking, not elsewhere classified: Secondary | ICD-10-CM

## 2019-01-22 NOTE — Therapy (Signed)
[facility] [address]  [address] [place], [place], [address] Phone: [phone]   Fax:  [phone]  Physical Therapy Evaluation  Patient Details  Name: Crystal Jackson MRN: [id number] Date of Birth: 1945-11-14 Referring Provider (PT): Richardean Canal, PA-C   Encounter Date: 01/22/2019  PT End of Session - 01/22/19 1408    Visit Number  1    Number of Visits  5    Date for PT Re-Evaluation  02/19/19    Authorization Type  [employer]HC Medicare    PT Start Time  1311    PT Stop Time  1400    PT Time Calculation (min)  49 min    Activity Tolerance  Patient tolerated treatment well    Behavior During Therapy  [facility] for tasks assessed/performed       Past Medical History:  Diagnosis Date  . Anxiety   . Arthritis   . Asthma   . Back pain   . Bilateral swelling of feet   . Constipation   . Depression   . Gallbladder problem   . GERD (gastroesophageal reflux disease)   . History of low potassium   . Hypertension   . Joint pain   . Obesity   . Palpitations   . Shortness of breath   . Sleep apnea     Past Surgical History:  Procedure Laterality Date  . ABDOMINAL HYSTERECTOMY    . APPENDECTOMY    . CHOLECYSTECTOMY    . KNEE SURGERY Left   . SHOULDER SURGERY Right     There were no vitals filed for this visit.   Subjective Assessment - 01/22/19 1313    Subjective  Patient reporting LBP starting in summer 2020 when she started to get a little more active in the yard. Was doing a lot of digging, pulling weeds, lifting, bending. Progressively got worse, without much benefit from meds or heat. Injection given in November helped a little. Pain worse with standing or slow walking. Walking is not as painful when she is going at a steady pace. Pain is located over B sides of LB, L>R. Denies radiation. Reporting intermittent N/T in B toes. Denies changes in B&B control. Better with ice and rest. Currently on a weight loss plan and is  trying to walk intermittently for exercise.    Pertinent History  SOB, palpitations, HTN, GERD, depression, B feet swelling, back pain, asthma, anxiety, R shoulder surgery, L knee surgery    Limitations  Standing;House hold activities    How long can you sit comfortably?  unlimited    How long can you stand comfortably?  15-20 min    How long can you walk comfortably?  10 min    Diagnostic tests  10/10/18 lumbar MRI: Lumbar spondylosis and degenerative disc disease resulting in mild impingement at L3-4. 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 Degenerative perifacet edema at L4-5 and L5-S1 with facet jointeffusions bilaterally at L4-5.    Patient Stated Goals  "to be able to walk around the block without pain, be able to stand at home without having to sit down"    Currently in Pain?  Yes    Pain Score  0-No pain    Pain Location  Back    Pain Orientation  Right;Left;Lower    Pain Descriptors / Indicators  Aching    Pain Type  Chronic pain         [facility] PT Assessment - 01/22/19 1322  Assessment   Medical Diagnosis  Spinal Stenosis of Lumbosacral Region    Referring Provider (PT)  Richardean Canal, PA-C    Onset Date/Surgical Date  06/02/18    Next MD Visit  not scheduled    Prior Therapy  yes- for knees      Precautions   Precautions  --   asthma     Balance Screen   Has the patient fallen in the past 6 months  No    Has the patient had a decrease in activity level because of a fear of falling?   No    Is the patient reluctant to leave their home because of a fear of falling?   No      Home Public house manager residence    Living Arrangements  Spouse/significant other    Available Help at Discharge  Family    Type of Home  House    Home Access  Stairs to enter    Entrance Stairs-Number of Steps  6    Entrance Stairs-Rails  Right;Left    Home Layout  One level    Home Equipment  [place] - single point      Prior Function   Level of Independence   Independent    Vocation  Retired    Leisure  walking for exercise      Cognition   Overall Cognitive Status  Within Functional Limits for tasks assessed      Observation/Other Assessments   Focus on Therapeutic Outcomes (FOTO)   Lumbar: 47 (53% limited, 44% predicted)      Sensation   Light Touch  Appears Intact      Coordination   Gross Motor Movements are Fluid and Coordinated  Yes      Posture/Postural Control   Posture/Postural Control  Postural limitations    Postural Limitations  Rounded Shoulders;Forward head      ROM / Strength   AROM / PROM / Strength  AROM;Strength      AROM   AROM Assessment Site  Lumbar    Lumbar Flexion  toes    Lumbar Extension  mildly limited   c/o slight dizziness   Lumbar - Right Side Bend  jt line    Lumbar - Left Side Bend  distal thigh    Lumbar - Right Rotation  WFL    Lumbar - Left Rotation  mildly limited      Strength   Strength Assessment Site  Hip;Knee;Ankle    Right/Left Hip  Right;Left    Right Hip Flexion  5/5    Right Hip Extension  4+/5    Right Hip External Rotation   4+/5    Right Hip Internal Rotation  4/5    Right Hip ABduction  4/5    Right Hip ADduction  4+/5    Left Hip Flexion  5/5    Left Hip Extension  4/5    Left Hip External Rotation  4+/5    Left Hip Internal Rotation  4/5    Left Hip ABduction  4/5    Left Hip ADduction  4+/5    Right/Left Knee  Right;Left    Right Knee Flexion  5/5    Right Knee Extension  5/5    Left Knee Flexion  5/5    Left Knee Extension  4+/5   audible painful pop   Right/Left Ankle  Right;Left    Right Ankle Dorsiflexion  5/5    Right Ankle Plantar  Flexion  4+/5    Left Ankle Dorsiflexion  4+/5    Left Ankle Plantar Flexion  4+/5      Flexibility   Soft Tissue Assessment /Muscle Length  yes    Hamstrings  B WNL    Quadriceps  B hip flexors WFL    Piriformis  B mildly tight in KTOS and fig 4      Palpation   Palpation comment  very mildly TTP in L QL and R piriformis       Ambulation/Gait   Assistive device  None    Gait Pattern  Step-through pattern;Poor foot clearance - right;Lateral trunk lean to right;Decreased step length - right;Decreased step length - left   increased L toe out   Ambulation Surface  Level;Indoor    Gait velocity  WFL                Objective measurements completed on examination: See above findings.              PT Education - 01/22/19 1405    Education Details  prognosis, POC, HEP    Person(s) Educated  Patient    Methods  Explanation;Demonstration;Tactile cues;Verbal cues;Handout    Comprehension  Returned demonstration;Verbalized understanding       PT Short Term Goals - 01/22/19 1417      PT SHORT TERM GOAL #1   Title  Patient to be independent with initial HEP.    Time  2    Period  Weeks    Status  New    Target Date  02/05/19        PT Long Term Goals - 01/22/19 1417      PT LONG TERM GOAL #1   Title  Patient to be independent with advanced HEP.    Time  4    Period  Weeks    Status  New    Target Date  02/19/19      PT LONG TERM GOAL #2   Title  Patient to report tolerance of 30 min of standing without pain limiting.    Time  4    Period  Weeks    Status  New    Target Date  02/12/19      PT LONG TERM GOAL #3   Title  Patient to demonstrate B LE strength >=4+/5.    Time  4    Period  Weeks    Status  New    Target Date  02/19/19      PT LONG TERM GOAL #4   Title  Patient to report tolerance of walking around her block without pain limiting.    Time  4    Period  Weeks    Status  New    Target Date  02/19/19             Plan - 01/22/19 1410    Clinical Impression Statement  Patient is a 74y/o F presenting to OPPT with c/o B LBP of 7 months duration. Pain began when she increased her workload in the yard, with progressive worsening over time. Pain has since improved with spinal injection given in November 2020. Patient denies radiation of pain or B&B changes  but does endorse intermittent N/T in B toes. Pain is worse with standing or slow walking. Patient presenting today with good lumbar AROM without pain, B hip weakness, mild B piriformis tightness, gait deviations, and minor TTP over L QL and R piriformis. Educated patient on  gentle hip stretching and strengthening HEP. Patient reported understanding. Would benefit from skilled PT services 1x/week for 4 weeks to address aforementioned impairments.    Personal Factors and Comorbidities  Age;Comorbidity 3+;Fitness;Past/Current Experience;Time since onset of injury/illness/exacerbation    Comorbidities  SOB, palpitations, HTN, GERD, depression, B feet swelling, back pain, asthma, anxiety, R shoulder surgery, L knee surgery    Examination-Activity Limitations  Carry;Stand;Hygiene/Grooming;Lift;Locomotion Level    Examination-Participation Restrictions  Church;Cleaning;Shop;Yard Work;Meal Prep;Laundry    Stability/Clinical Decision Making  Stable/Uncomplicated    Clinical Decision Making  Low    Rehab Potential  Good    PT Frequency  1x / week    PT Duration  4 weeks    PT Treatment/Interventions  ADLs/Self Care Home Management;Cryotherapy;Electrical Stimulation;Moist Heat;Traction;Balance training;Therapeutic exercise;Therapeutic activities;Functional mobility training;Stair training;Gait training;Ultrasound;Neuromuscular re-education;Patient/family education;Manual techniques;Vasopneumatic Device;Taping;Energy conservation;Dry needling;Passive range of motion    PT Next Visit Plan  reassess HEP; progress hip strengthening    Consulted and Agree with Plan of Care  Patient       Patient will benefit from skilled therapeutic intervention in order to improve the following deficits and impairments:  Decreased activity tolerance, Decreased strength, Pain, Improper body mechanics, Impaired flexibility, Postural dysfunction  Visit Diagnosis: Chronic bilateral low back pain without sciatica  Muscle weakness  (generalized)  Difficulty in walking, not elsewhere classified     Problem List Patient Active Problem List   Diagnosis Date Noted  . OSA (obstructive sleep apnea) 09/03/2018  . Mild persistent asthma without complication 09/03/2018  . Severe obesity (BMI >= 40) (HCC) 04/14/2018  . Unilateral primary osteoarthritis, left knee 04/14/2018    Anette Guarneri, PT, DPT 01/22/19 2:20 PM   [facility] [address]  [address] [place], [place], [address] Phone: [phone]   Fax:  [phone]  Name: Crystal Jackson MRN: [id number] Date of Birth: [birth year]

## 2019-01-26 ENCOUNTER — Encounter (INDEPENDENT_AMBULATORY_CARE_PROVIDER_SITE_OTHER): Payer: Self-pay | Admitting: Physician Assistant

## 2019-01-26 ENCOUNTER — Other Ambulatory Visit: Payer: Self-pay

## 2019-01-26 ENCOUNTER — Ambulatory Visit (INDEPENDENT_AMBULATORY_CARE_PROVIDER_SITE_OTHER): Payer: Medicare Other | Admitting: Physician Assistant

## 2019-01-26 VITALS — BP 110/65 | HR 75 | Temp 98.2°F | Ht 64.0 in | Wt 225.0 lb

## 2019-01-26 DIAGNOSIS — Z6838 Body mass index (BMI) 38.0-38.9, adult: Secondary | ICD-10-CM

## 2019-01-26 DIAGNOSIS — E559 Vitamin D deficiency, unspecified: Secondary | ICD-10-CM

## 2019-01-26 NOTE — Progress Notes (Signed)
Chief Complaint:   OBESITY Crystal Jackson is here to discuss her progress with her obesity treatment plan along with follow-up of her obesity related diagnoses. Crystal Jackson is on the Category 2 Plan. Crystal Jackson states she is walking.   Today's visit was #: 13 Starting weight: 242 lbs Starting date: 07/09/2018 Today's weight: 225 lbs Today's date: 01/26/2019 Total lbs lost to date: 17  Total lbs lost since last in-office visit: 0  Interim History: Crystal Jackson reports that her breakfast and lunch are on plan, but she does not like to cook, therefore doesn't always eat on plan for dinner. She is also cooking for her husband who eats potatoes, rice, and other simple carbs.  Subjective:   Vitamin D deficiency. Crystal Jackson is on OTC Vitamin D with calcium. No nausea, vomiting, or muscle weakness. Last Vitamin D level was 90.2 on 11/10/2018.   Assessment/Plan:   Vitamin D deficiency. Low Vitamin D level contributes to fatigue and are associated with obesity, breast, and colon cancer. She agrees to continue to take OTC Vitamin D and will follow-up for routine testing of Vitamin D, at least 2-3 times per year to avoid over-replacement.  Class 2 severe obesity with serious comorbidity and body mass index (BMI) of 38.0 to 38.9 in adult, unspecified obesity type (HCC).  Crystal Jackson is currently in the action stage of change. As such, her goal is to continue with weight loss efforts. She has agreed to the Category 2 Plan.   Exercise goals: Older adults should follow the adult guidelines. When older adults cannot meet the adult guidelines, they should be as physically active as their abilities and conditions will allow.  Older adults should do exercises that maintain or improve balance if they are at risk of falling.   Behavioral modification strategies: increasing lean protein intake and meal planning and cooking strategies.  Crystal Jackson has agreed to follow-up with our clinic in 2 weeks. She was informed of the  importance of frequent follow-up visits to maximize her success with intensive lifestyle modifications for her multiple health conditions.   Objective:   Blood pressure 110/65, pulse 75, temperature 98.2 F (36.8 C), height 5\' 4"  (1.626 m), weight 225 lb (102.1 kg), SpO2 95 %. Body mass index is 38.62 kg/m.  General: Cooperative, alert, well developed, in no acute distress. HEENT: Conjunctivae and lids unremarkable. Cardiovascular: Regular rhythm.  Lungs: Normal work of breathing. Neurologic: No focal deficits.   Lab Results  Component Value Date   CREATININE 0.91 11/10/2018   BUN 21 11/10/2018   NA 138 11/10/2018   K 3.8 11/10/2018   CL 98 11/10/2018   CO2 27 11/10/2018   Lab Results  Component Value Date   ALT 22 11/10/2018   AST 24 11/10/2018   ALKPHOS 46 11/10/2018   BILITOT 0.3 11/10/2018   Lab Results  Component Value Date   HGBA1C 5.4 11/10/2018   HGBA1C 5.8 (H) 07/09/2018   Lab Results  Component Value Date   INSULIN 33.0 (H) 11/10/2018   INSULIN 25.0 (H) 07/09/2018   Lab Results  Component Value Date   TSH 2.210 07/09/2018   Lab Results  Component Value Date   CHOL 158 07/09/2018   HDL 54 07/09/2018   LDLCALC 86 07/09/2018   TRIG 92 07/09/2018   Lab Results  Component Value Date   WBC 6.0 07/09/2018   HGB 13.6 07/09/2018   HCT 39.7 07/09/2018   MCV 89 07/09/2018   PLT 241 07/09/2018   No results found for:  IRON, TIBC, FERRITIN  Attestation Statements:   Reviewed by clinician on day of visit: allergies, medications, problem list, medical history, surgical history, family history, social history, and previous encounter notes.  Time spent on visit including pre-visit chart review and post-visit care was 31 minutes.   IMichaelene Song, am acting as transcriptionist for Abby Potash, PA-C   I have reviewed the above documentation for accuracy and completeness, and I agree with the above. Abby Potash, PA-C

## 2019-01-28 ENCOUNTER — Ambulatory Visit: Payer: Medicare Other

## 2019-01-28 ENCOUNTER — Other Ambulatory Visit: Payer: Self-pay

## 2019-01-28 VITALS — BP 134/84

## 2019-01-28 DIAGNOSIS — M545 Low back pain, unspecified: Secondary | ICD-10-CM

## 2019-01-28 DIAGNOSIS — M6281 Muscle weakness (generalized): Secondary | ICD-10-CM

## 2019-01-28 DIAGNOSIS — G8929 Other chronic pain: Secondary | ICD-10-CM

## 2019-01-28 DIAGNOSIS — R262 Difficulty in walking, not elsewhere classified: Secondary | ICD-10-CM

## 2019-01-28 NOTE — Patient Instructions (Addendum)

## 2019-01-28 NOTE — Therapy (Signed)
[facility] [address]  [place], [place], [address] Phone: [phone]   Fax:  [phone]  Physical Therapy Treatment  Patient Details  Name: Crystal Jackson MRN: [id number] Date of Birth: 01-May-1945 Referring Provider (PT): Erskine Emery, PA-C   Encounter Date: 01/28/2019  PT End of Session - 01/28/19 1407    Visit Number  2    Number of Visits  5    Date for PT Re-Evaluation  02/19/19    Authorization Type  [employer]HC Medicare    PT Start Time  1402    PT Stop Time  1447    PT Time Calculation (min)  45 min    Activity Tolerance  Patient tolerated treatment well    Behavior During Therapy  [facility] for tasks assessed/performed       Past Medical History:  Diagnosis Date  . Anxiety   . Arthritis   . Asthma   . Back pain   . Bilateral swelling of feet   . Constipation   . Depression   . Gallbladder problem   . GERD (gastroesophageal reflux disease)   . History of low potassium   . Hypertension   . Joint pain   . Obesity   . Palpitations   . Shortness of breath   . Sleep apnea     Past Surgical History:  Procedure Laterality Date  . ABDOMINAL HYSTERECTOMY    . APPENDECTOMY    . CHOLECYSTECTOMY    . KNEE SURGERY Left   . SHOULDER SURGERY Right     Vitals:   01/28/19 1404  BP: 134/84    Subjective Assessment - 01/28/19 1404    Subjective  Pt. doing well today with no new complaints.    Pertinent History  SOB, palpitations, HTN, GERD, depression, B feet swelling, back pain, asthma, anxiety, R shoulder surgery, L knee surgery    Diagnostic tests  10/10/18 lumbar MRI: Lumbar spondylosis and degenerative disc disease resulting in mild impingement at L3-4. 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 Degenerative perifacet edema at L4-5 and L5-S1 with facet jointeffusions bilaterally at L4-5.    Patient Stated Goals  "to be able to walk around the block without pain, be able to stand at home without having to  sit down"    Currently in Pain?  Yes    Pain Score  0-No pain   pain worsening to 6-7/10 at worst with prolonge walking   Pain Location  Back    Pain Orientation  Right;Left;Lower    Pain Descriptors / Indicators  Aching    Pain Type  Chronic pain                       [facility] Adult PT Treatment/Exercise - 01/28/19 0001      Lumbar Exercises: Stretches   Piriformis Stretch  Right;Left;2 reps;30 seconds    Piriformis Stretch Limitations  KTOS    Figure 4 Stretch  2 reps;20 seconds    Figure 4 Stretch Limitations  Figure-4 stretch       Lumbar Exercises: Aerobic   Nustep  Lvl 1, 6 min (LE only)       Lumbar Exercises: Standing   Other Standing Lumbar Exercises  standing abdcution hip kicker at counter topx 10 rpes    cues to maintain L neutral hip alignment      Lumbar Exercises: Supine   Clam  10 reps;3 seconds    Clam Limitations  red looped TB at knees     Bent Knee Raise  10 reps;3 seconds    Bent Knee Raise Limitations  red looped TB at knees     Bridge with clamshell  10 reps;3 seconds    Bridge with [employer] Limitations  + isometric hip abd/ER into yellow TB at knees       Lumbar Exercises: Sidelying   Clam  Right;Left;10 reps;3 seconds    Clam Limitations  yellow TB              PT Education - 01/28/19 1545    Education Details  HEP update;  standing hip abduction at counter    Person(s) Educated  Patient    Methods  Explanation;Demonstration;Verbal cues;Handout    Comprehension  Verbalized understanding;Returned demonstration;Verbal cues required       PT Short Term Goals - 01/28/19 1408      PT SHORT TERM GOAL #1   Title  Patient to be independent with initial HEP.    Time  2    Period  Weeks    Status  On-going    Target Date  02/05/19        PT Long Term Goals - 01/28/19 1409      PT LONG TERM GOAL #1   Title  Patient to be independent with advanced HEP.    Time  4    Period  Weeks    Status  On-going      PT LONG  TERM GOAL #2   Title  Patient to report tolerance of 30 min of standing without pain limiting.    Time  4    Period  Weeks    Status  On-going      PT LONG TERM GOAL #3   Title  Patient to demonstrate B LE strength >=4+/5.    Time  4    Period  Weeks    Status  On-going      PT LONG TERM GOAL #4   Title  Patient to report tolerance of walking around her block without pain limiting.    Time  4    Period  Weeks    Status  On-going            Plan - 01/28/19 1409    Clinical Impression Statement  [name] reporting she was able to perform HEP without issue.  Only min cueing for positioning required with HEP review today.  Pt. tolerated addition of standing hip abduction strengthening well thus updated HEP with this activity.  Pt. feels her LBP has improved some since initial eval.  Encouraged pt. to continue walking regularly and for daily adherence to HEP.  Pt. verbalized understanding.  Ended visit pain free thus modalities deferred. Pt. with onset of "dizziness" with sit>stand transition x 2 today intermittently during session.  BP recorded WFL in sitting however no formal testing for orthostatic hypotension recorded today.  May consider formal testing for orthostatic hypotension in coming sessions per pt. symptoms.    Comorbidities  SOB, palpitations, HTN, GERD, depression, B feet swelling, back pain, asthma, anxiety, R shoulder surgery, L knee surgery    Rehab Potential  Good    PT Treatment/Interventions  ADLs/Self Care Home Management;Cryotherapy;Electrical Stimulation;Moist Heat;Traction;Balance training;Therapeutic exercise;Therapeutic activities;Functional mobility training;Stair training;Gait training;Ultrasound;Neuromuscular re-education;Patient/family education;Manual techniques;Vasopneumatic Device;Taping;Energy conservation;Dry needling;Passive range of motion    PT Next Visit Plan  possible testing for orthostatic hypotension per pt. symptoms; Progress hip strengthening     Consulted and Agree  with Plan of Care  Patient       Patient will benefit from skilled therapeutic intervention in order to improve the following deficits and impairments:  Decreased activity tolerance, Decreased strength, Pain, Improper body mechanics, Impaired flexibility, Postural dysfunction  Visit Diagnosis: Chronic bilateral low back pain without sciatica  Muscle weakness (generalized)  Difficulty in walking, not elsewhere classified     Problem List Patient Active Problem List   Diagnosis Date Noted  . OSA (obstructive sleep apnea) 09/03/2018  . Mild persistent asthma without complication 09/03/2018  . Severe obesity (BMI >= 40) (HCC) 04/14/2018  . Unilateral primary osteoarthritis, left knee 04/14/2018    Kermit Balo, PTA 01/28/19 4:28 PM   [facility] [address]  [address] [place], [place], [address] Phone: [phone]   Fax:  [phone]  Name: Reesa Gotschall MRN: [id number] Date of Birth: 04-Apr-1945

## 2019-02-05 ENCOUNTER — Ambulatory Visit: Payer: Medicare Other | Attending: Physician Assistant

## 2019-02-05 ENCOUNTER — Other Ambulatory Visit: Payer: Self-pay

## 2019-02-05 VITALS — BP 138/68 | HR 71

## 2019-02-05 DIAGNOSIS — R262 Difficulty in walking, not elsewhere classified: Secondary | ICD-10-CM | POA: Insufficient documentation

## 2019-02-05 DIAGNOSIS — M545 Low back pain: Secondary | ICD-10-CM | POA: Insufficient documentation

## 2019-02-05 DIAGNOSIS — M6281 Muscle weakness (generalized): Secondary | ICD-10-CM | POA: Insufficient documentation

## 2019-02-05 DIAGNOSIS — G8929 Other chronic pain: Secondary | ICD-10-CM | POA: Insufficient documentation

## 2019-02-05 NOTE — Therapy (Signed)
[facility] [address]  [place], [place], [address] Phone: [phone]   Fax:  [phone]  Physical Therapy Treatment  Patient Details  Name: Crystal Jackson MRN: [id number] Date of Birth: 1945-03-16 Referring Provider (PT): Erskine Emery, PA-C   Encounter Date: 02/05/2019  PT End of Session - 02/05/19 1408    Visit Number  3    Number of Visits  5    Date for PT Re-Evaluation  02/19/19    Authorization Type  UHC Medicare    PT Start Time  1402    PT Stop Time  1440    PT Time Calculation (min)  38 min    Activity Tolerance  Patient tolerated treatment well    Behavior During Therapy  [facility] for tasks assessed/performed       Past Medical History:  Diagnosis Date  . Anxiety   . Arthritis   . Asthma   . Back pain   . Bilateral swelling of feet   . Constipation   . Depression   . Gallbladder problem   . GERD (gastroesophageal reflux disease)   . History of low potassium   . Hypertension   . Joint pain   . Obesity   . Palpitations   . Shortness of breath   . Sleep apnea     Past Surgical History:  Procedure Laterality Date  . ABDOMINAL HYSTERECTOMY    . APPENDECTOMY    . CHOLECYSTECTOMY    . KNEE SURGERY Left   . SHOULDER SURGERY Right     Vitals:   02/05/19 1407  BP: 138/68  Pulse: 71  SpO2: 98%    Subjective Assessment - 02/05/19 1407    Subjective  Pt. denies new issues.    Pertinent History  SOB, palpitations, HTN, GERD, depression, B feet swelling, back pain, asthma, anxiety, R shoulder surgery, L knee surgery    Diagnostic tests  10/10/18 lumbar MRI: Lumbar spondylosis and degenerative disc disease resulting in mild impingement at L3-4. 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 Degenerative perifacet edema at L4-5 and L5-S1 with facet jointeffusions bilaterally at L4-5.    Patient Stated Goals  "to be able to walk around the block without pain, be able to stand at home without having  to sit down"    Currently in Pain?  No/denies    Pain Score  0-No pain    Pain Location  Back    Pain Orientation  Left;Lower    Pain Descriptors / Indicators  Aching    Pain Type  Chronic pain    Pain Onset  More than a month ago    Pain Frequency  Intermittent    Aggravating Factors   housework, making bed    Pain Relieving Factors  unsure    Multiple Pain Sites  No                       [facility] Adult PT Treatment/Exercise - 02/05/19 0001      Lumbar Exercises: Stretches   Lower Trunk Rotation Limitations  5" x 10 reps     Piriformis Stretch  Right;Left;2 reps;30 seconds    Piriformis Stretch Limitations  KTOS - with towel     Figure 4 Stretch  2 reps;20 seconds    Figure 4 Stretch Limitations  Figure-4 stretch       Lumbar Exercises: Aerobic   Nustep  Lvl 2, 7 min (LE only)  Lumbar Exercises: Standing   Other Standing Lumbar Exercises  standing hip extension, abdcution hip kicker at counter topx 10 rpes       Lumbar Exercises: Supine   Clam  3 seconds   x 12 reps   Clam Limitations  red looped TB at knees     Bridge with clamshell  3 seconds   x 12 reps    Bridge with [employer] Limitations  + isometric hip abd/ER into red TB at knees       Lumbar Exercises: Sidelying   Clam  Right;Left;15 reps             PT Education - 02/05/19 1459    Education Details  HEP update; LTR,  standing hip flexion, extension    Person(s) Educated  Patient    Methods  Explanation;Demonstration;Verbal cues;Handout    Comprehension  Verbalized understanding;Returned demonstration;Verbal cues required       PT Short Term Goals - 02/05/19 1752      PT SHORT TERM GOAL #1   Title  Patient to be independent with initial HEP.    Time  2    Period  Weeks    Status  Achieved    Target Date  02/05/19        PT Long Term Goals - 01/28/19 1409      PT LONG TERM GOAL #1   Title  Patient to be independent with advanced HEP.    Time  4    Period  Weeks     Status  On-going      PT LONG TERM GOAL #2   Title  Patient to report tolerance of 30 min of standing without pain limiting.    Time  4    Period  Weeks    Status  On-going      PT LONG TERM GOAL #3   Title  Patient to demonstrate B LE strength >=4+/5.    Time  4    Period  Weeks    Status  On-going      PT LONG TERM GOAL #4   Title  Patient to report tolerance of walking around her block without pain limiting.    Time  4    Period  Weeks    Status  On-going            Plan - 02/05/19 1411    Clinical Impression Statement  Pt. reporting she is performing initial HEP daily and denies questions.  STG #1 met.  Tolerated mild progression of proximal hip strengthening today well.  Continued LE stretching to pt. tolerance.  Updated HEP with additional hip strengthening and LTR for improved lumbar AROM rotation.  Ended visit pain free thus modalities deferred.  Will monitor tolerance to updated HEP in coming session.    Comorbidities  SOB, palpitations, HTN, GERD, depression, B feet swelling, back pain, asthma, anxiety, R shoulder surgery, L knee surgery    Rehab Potential  Good    PT Treatment/Interventions  ADLs/Self Care Home Management;Cryotherapy;Electrical Stimulation;Moist Heat;Traction;Balance training;Therapeutic exercise;Therapeutic activities;Functional mobility training;Stair training;Gait training;Ultrasound;Neuromuscular re-education;Patient/family education;Manual techniques;Vasopneumatic Device;Taping;Energy conservation;Dry needling;Passive range of motion    PT Next Visit Plan  Progress hip strengthening    Consulted and Agree with Plan of Care  Patient       Patient will benefit from skilled therapeutic intervention in order to improve the following deficits and impairments:  Decreased activity tolerance, Decreased strength, Pain, Improper body mechanics, Impaired flexibility, Postural dysfunction  Visit Diagnosis:  Chronic bilateral low back pain without  sciatica  Muscle weakness (generalized)  Difficulty in walking, not elsewhere classified     Problem List Patient Active Problem List   Diagnosis Date Noted  . OSA (obstructive sleep apnea) 09/03/2018  . Mild persistent asthma without complication 90/56/4698  . Severe obesity (BMI >= 40) ([place]) 04/14/2018  . Unilateral primary osteoarthritis, left knee 04/14/2018    Bess [name], PTA 02/05/19 5:56 PM    [facility] [address]  [place], [place], [address] Phone: [phone]   Fax:  [phone]  Name: Crystal Jackson MRN: [id number] Date of Birth: June 03, 1945

## 2019-02-09 ENCOUNTER — Encounter (INDEPENDENT_AMBULATORY_CARE_PROVIDER_SITE_OTHER): Payer: Self-pay | Admitting: Physician Assistant

## 2019-02-09 ENCOUNTER — Other Ambulatory Visit: Payer: Self-pay

## 2019-02-09 ENCOUNTER — Ambulatory Visit (INDEPENDENT_AMBULATORY_CARE_PROVIDER_SITE_OTHER): Payer: Medicare Other | Admitting: Physician Assistant

## 2019-02-09 VITALS — BP 126/75 | HR 78 | Temp 97.8°F | Ht 64.0 in | Wt 224.0 lb

## 2019-02-09 DIAGNOSIS — I10 Essential (primary) hypertension: Secondary | ICD-10-CM | POA: Diagnosis not present

## 2019-02-09 DIAGNOSIS — E559 Vitamin D deficiency, unspecified: Secondary | ICD-10-CM

## 2019-02-09 DIAGNOSIS — Z6838 Body mass index (BMI) 38.0-38.9, adult: Secondary | ICD-10-CM | POA: Diagnosis not present

## 2019-02-09 NOTE — Progress Notes (Signed)
Chief Complaint:   OBESITY Crystal Jackson is here to discuss her progress with her obesity treatment plan along with follow-up of her obesity related diagnoses. Crystal Jackson is on the Category 2 Plan and states she is following her eating plan approximately 85% of the time. Crystal Jackson states she is walking/Physical Therapy 20 minutes 7 times per week.  Today's visit was #: 14 Starting weight: 242 lbs Starting date: 07/09/2018 Today's weight: 224 lbs Today's date: 02/09/2019 Total lbs lost to date: 18 Total lbs lost since last in-office visit: 1  Interim History: Crystal Jackson reports that she has had trouble with some constipation recently. She denies abdominal pain or blood in stool. She continues to struggle with getting all of her protein in.  Subjective:   Essential hypertension. Crystal Jackson is on Cardizem and metoprolol. Blood pressure normal. No chest pain.  BP Readings from Last 3 Encounters:  02/09/19 126/75  02/05/19 138/68  01/28/19 134/84   Lab Results  Component Value Date   CREATININE 0.91 11/10/2018   CREATININE 1.07 (H) 08/25/2018   CREATININE 0.98 07/09/2018   Vitamin D deficiency. Crystal Jackson is not currently on Vitamin D due to last level being high (90.2 on 11/10/2018).  Assessment/Plan:   Essential hypertension. Crystal Jackson is working on healthy weight loss and exercise to improve blood pressure control. We will watch for signs of hypotension as she continues her lifestyle modifications. She will continue her medication as prescribed.  Vitamin D deficiency. Low Vitamin D level contributes to fatigue and are associated with obesity, breast, and colon cancer. Will continue to monitor her level.  Class 2 severe obesity with serious comorbidity and body mass index (BMI) of 38.0 to 38.9 in adult, unspecified obesity type ([place]).  Crystal Jackson is currently in the action stage of change. As such, her goal is to continue with weight loss efforts. She has agreed to the Category 2 Plan.   Exercise  goals: Older adults should follow the adult guidelines. When older adults cannot meet the adult guidelines, they should be as physically active as their abilities and conditions will allow.   Behavioral modification strategies: increasing lean protein intake and meal planning and cooking strategies.  Crystal Jackson has agreed to follow-up with our clinic in 2 weeks. She was informed of the importance of frequent follow-up visits to maximize her success with intensive lifestyle modifications for her multiple health conditions.   Objective:   Blood pressure 126/75, pulse 78, temperature 97.8 F (36.6 C), temperature source Oral, height 5\' 4"  (1.626 m), weight 224 lb (101.6 kg), SpO2 97 %. Body mass index is 38.45 kg/m.  General: Cooperative, alert, well developed, in no acute distress. HEENT: Conjunctivae and lids unremarkable. Cardiovascular: Regular rhythm.  Lungs: Normal work of breathing. Neurologic: No focal deficits.   Lab Results  Component Value Date   CREATININE 0.91 11/10/2018   BUN 21 11/10/2018   NA 138 11/10/2018   K 3.8 11/10/2018   CL 98 11/10/2018   CO2 27 11/10/2018   Lab Results  Component Value Date   ALT 22 11/10/2018   AST 24 11/10/2018   ALKPHOS 46 11/10/2018   BILITOT 0.3 11/10/2018   Lab Results  Component Value Date   HGBA1C 5.4 11/10/2018   HGBA1C 5.8 (H) 07/09/2018   Lab Results  Component Value Date   INSULIN 33.0 (H) 11/10/2018   INSULIN 25.0 (H) 07/09/2018   Lab Results  Component Value Date   TSH 2.210 07/09/2018   Lab Results  Component Value Date  CHOL 158 07/09/2018   HDL 54 07/09/2018   LDLCALC 86 07/09/2018   TRIG 92 07/09/2018   Lab Results  Component Value Date   WBC 6.0 07/09/2018   HGB 13.6 07/09/2018   HCT 39.7 07/09/2018   MCV 89 07/09/2018   PLT 241 07/09/2018   No results found for: IRON, TIBC, FERRITIN  Obesity Behavioral Intervention Documentation for Insurance:   Approximately 15 minutes were spent on the  discussion below.  ASK: We discussed the diagnosis of obesity with Crystal Jackson today and Crystal Jackson agreed to give [name] permission to discuss obesity behavioral modification therapy today.  ASSESS: [name] has the diagnosis of obesity and her BMI today is 38.6. Sacheen is in the action stage of change.   ADVISE: Crystal Jackson was educated on the multiple health risks of obesity as well as the benefit of weight loss to improve her health. She was advised of the need for long term treatment and the importance of lifestyle modifications to improve her current health and to decrease her risk of future health problems.  AGREE: Multiple dietary modification options and treatment options were discussed and Crystal Jackson agreed to follow the recommendations documented in the above note.  ARRANGE: Crystal Jackson was educated on the importance of frequent visits to treat obesity as outlined per CMS and USPSTF guidelines and agreed to schedule her next follow up appointment today.  Attestation Statements:   Reviewed by clinician on day of visit: allergies, medications, problem list, medical history, surgical history, family history, social history, and previous encounter notes.  IMarianna Payment, am acting as transcriptionist for [name], PA-C   I have reviewed the above documentation for accuracy and completeness, and I agree with the above. [name], PA-C

## 2019-02-11 ENCOUNTER — Other Ambulatory Visit: Payer: Self-pay

## 2019-02-11 ENCOUNTER — Ambulatory Visit: Payer: Medicare Other

## 2019-02-11 DIAGNOSIS — M6281 Muscle weakness (generalized): Secondary | ICD-10-CM

## 2019-02-11 DIAGNOSIS — R262 Difficulty in walking, not elsewhere classified: Secondary | ICD-10-CM

## 2019-02-11 DIAGNOSIS — M545 Low back pain, unspecified: Secondary | ICD-10-CM

## 2019-02-11 DIAGNOSIS — G8929 Other chronic pain: Secondary | ICD-10-CM

## 2019-02-11 NOTE — Therapy (Signed)
[facility] [address]  [place], [place], [address] Phone: [phone]   Fax:  [phone]  Physical Therapy Treatment  Patient Details  Name: Crystal Jackson MRN: [id number] Date of Birth: 14-Nov-1945 Referring Provider (PT): Erskine Emery, PA-C   Encounter Date: 02/11/2019  PT End of Session - 02/11/19 1411    Visit Number  4    Number of Visits  5    Date for PT Re-Evaluation  02/19/19    Authorization Type  UHC Medicare    PT Start Time  1401    PT Stop Time  1439    PT Time Calculation (min)  38 min    Activity Tolerance  Patient tolerated treatment well    Behavior During Therapy  [facility] for tasks assessed/performed       Past Medical History:  Diagnosis Date  . Anxiety   . Arthritis   . Asthma   . Back pain   . Bilateral swelling of feet   . Constipation   . Depression   . Gallbladder problem   . GERD (gastroesophageal reflux disease)   . History of low potassium   . Hypertension   . Joint pain   . Obesity   . Palpitations   . Shortness of breath   . Sleep apnea     Past Surgical History:  Procedure Laterality Date  . ABDOMINAL HYSTERECTOMY    . APPENDECTOMY    . CHOLECYSTECTOMY    . KNEE SURGERY Left   . SHOULDER SURGERY Right     There were no vitals filed for this visit.  Subjective Assessment - 02/11/19 1409    Subjective  Pt. noting increased LBP yesterday and day before however unsure of trigger.    Pertinent History  SOB, palpitations, HTN, GERD, depression, B feet swelling, back pain, asthma, anxiety, R shoulder surgery, L knee surgery    Diagnostic tests  10/10/18 lumbar MRI: Lumbar spondylosis and degenerative disc disease resulting in mild impingement at L3-4. 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 Degenerative perifacet edema at L4-5 and L5-S1 with facet jointeffusions bilaterally at L4-5.    Patient Stated Goals  "to be able to walk around the block without pain, be able  to stand at home without having to sit down"    Currently in Pain?  No/denies    Pain Score  0-No pain    Pain Location  Back    Pain Orientation  Left;Lower    Pain Descriptors / Indicators  Aching    Pain Type  Chronic pain    Pain Onset  More than a month ago    Pain Frequency  Intermittent    Aggravating Factors   Unsure    Multiple Pain Sites  No                       [facility] Adult PT Treatment/Exercise - 02/11/19 0001      Self-Care   Self-Care  Other Self-Care Comments    Other Self-Care Comments   Reviewed comprehensive HEP to check for understanding and need for update       Lumbar Exercises: Stretches   Single Knee to Chest Stretch  Right;Left;30 seconds;2 reps    Single Knee to Chest Stretch Limitations  towel assistance    Lower Trunk Rotation Limitations  5" x 10 reps     Piriformis Stretch  Right;Left;30 seconds;1 rep    Piriformis Stretch Limitations  KTOS with towel       Lumbar Exercises: Aerobic   Nustep  Lvl 4, 6 min (LE only)       Lumbar Exercises: Standing   Heel Raises  10 reps;3 seconds    Heel Raises Limitations  heel raise/toe raise       Knee/Hip Exercises: Standing   Hip Flexion  Right;Left;10 reps;Knee straight    Hip Flexion Limitations  yellow TB at ankles; TM rail suppor t    Hip Abduction  Right;Left;10 reps;Knee straight    Abduction Limitations  yellow TB at ankles; TM rail support     Hip Extension  Right;Left;10 reps;Knee straight;Stengthening    Extension Limitations  yellow TB at ankles;TM support      Modalities   Modalities  Moist Heat      Moist Heat Therapy   Number Minutes Moist Heat  10 Minutes    Moist Heat Location  Lumbar Spine             PT Education - 02/11/19 1447    Education Details  HEP update;  yellow looped TB for 3-way standing hip kicker    Person(s) Educated  Patient    Methods  Explanation;Verbal cues    Comprehension  Verbal cues required       PT Short Term Goals - 02/05/19  1752      PT SHORT TERM GOAL #1   Title  Patient to be independent with initial HEP.    Time  2    Period  Weeks    Status  Achieved    Target Date  02/05/19        PT Long Term Goals - 02/11/19 1421      PT LONG TERM GOAL #1   Title  Patient to be independent with advanced HEP.    Time  4    Period  Weeks    Status  On-going      PT LONG TERM GOAL #2   Title  Patient to report tolerance of 30 min of standing without pain limiting.    Time  4    Period  Weeks    Status  Partially Met   02/11/19:  notes she can tolerate15 min standing without pain limiting then needs to sit to reduce her pain     PT LONG TERM GOAL #3   Title  Patient to demonstrate B LE strength >=4+/5.    Time  4    Period  Weeks    Status  On-going      PT LONG TERM GOAL #4   Title  Patient to report tolerance of walking around her block without pain limiting.    Time  4    Period  Weeks    Status  Achieved   02/11/19:  achieved when she walked on 02/07/19           Plan - 02/11/19 1412    Clinical Impression Statement  Pt. noting 65-70% improvement since starting physical therapy noting improved stamina and LE flexibility.  Reports that she can now stand for 15 min before pain limits her requiring a sitting rest break for relief.  STG #2 partially achieved.  Pt. noting was able to walk around her block in her neighborhood without pain limiting.  LTG #4 met.  Crystal Jackson wishes to transition to home program after next visit as she feels, "I can do the exercises on my own at home".  Will plan for final goal  testing and final HEP review at upcoming visit.    Comorbidities  SOB, palpitations, HTN, GERD, depression, B feet swelling, back pain, asthma, anxiety, R shoulder surgery, L knee surgery    Rehab Potential  Good    PT Treatment/Interventions  ADLs/Self Care Home Management;Cryotherapy;Electrical Stimulation;Moist Heat;Traction;Balance training;Therapeutic exercise;Therapeutic activities;Functional  mobility training;Stair training;Gait training;Ultrasound;Neuromuscular re-education;Patient/family education;Manual techniques;Vasopneumatic Device;Taping;Energy conservation;Dry needling;Passive range of motion    PT Next Visit Plan  Progress hip strengthening    Consulted and Agree with Plan of Care  Patient       Patient will benefit from skilled therapeutic intervention in order to improve the following deficits and impairments:  Decreased activity tolerance, Decreased strength, Pain, Improper body mechanics, Impaired flexibility, Postural dysfunction  Visit Diagnosis: Chronic bilateral low back pain without sciatica  Muscle weakness (generalized)  Difficulty in walking, not elsewhere classified     Problem List Patient Active Problem List   Diagnosis Date Noted  . OSA (obstructive sleep apnea) 09/03/2018  . Mild persistent asthma without complication 28/63/8177  . Severe obesity (BMI >= 40) ([place]) 04/14/2018  . Unilateral primary osteoarthritis, left knee 04/14/2018    Bess [name], PTA 02/11/19 2:59 PM   [facility] [address]  [place], [place], [address] Phone: [phone]   Fax:  [phone]  Name: Crystal Jackson MRN: [id number] Date of Birth: 10/12/1945

## 2019-02-18 ENCOUNTER — Ambulatory Visit: Payer: Medicare Other | Admitting: Physical Therapy

## 2019-02-23 ENCOUNTER — Other Ambulatory Visit: Payer: Self-pay

## 2019-02-23 ENCOUNTER — Ambulatory Visit (INDEPENDENT_AMBULATORY_CARE_PROVIDER_SITE_OTHER): Payer: Medicare Other | Admitting: Physician Assistant

## 2019-02-23 ENCOUNTER — Encounter (INDEPENDENT_AMBULATORY_CARE_PROVIDER_SITE_OTHER): Payer: Self-pay | Admitting: Physician Assistant

## 2019-02-23 VITALS — BP 143/82 | HR 81 | Temp 98.3°F | Ht 64.0 in | Wt 222.0 lb

## 2019-02-23 DIAGNOSIS — Z6838 Body mass index (BMI) 38.0-38.9, adult: Secondary | ICD-10-CM

## 2019-02-23 DIAGNOSIS — R7303 Prediabetes: Secondary | ICD-10-CM

## 2019-02-23 DIAGNOSIS — E559 Vitamin D deficiency, unspecified: Secondary | ICD-10-CM | POA: Diagnosis not present

## 2019-02-23 NOTE — Progress Notes (Signed)
Chief Complaint:   OBESITY Corby is here to discuss her progress with her obesity treatment plan along with follow-up of her obesity related diagnoses. Atha is on the Category 2 Plan and states she is following her eating plan approximately 75% of the time. Criss states she is walking and doing rehab exercising for 15 minutes 6-7 times per week.  Today's visit was #: 15 Starting weight: 242 lbs Starting date: 07/09/2018 Today's weight: 222 lbs Today's date: 02/23/2019 Total lbs lost to date: 20 lbs Total lbs lost since last in-office visit: 2 lbs  Interim History: Adama reports that she has lacked motivation the last 2 weeks.  She is ready to get back on track.  Subjective:   1. Vitamin D deficiency Verble's Vitamin D level was 90.2 on 11/11/2018. She is not currently taking vit D. She denies nausea, vomiting or muscle weakness.  2. Prediabetes Solymar has a diagnosis of prediabetes based on her elevated HgA1c and was informed this puts her at greater risk of developing diabetes. She continues to work on diet and exercise to decrease her risk of diabetes. She denies nausea or hypoglycemia.  No medications currently.  No polyphagia.  Lab Results  Component Value Date   HGBA1C 5.4 11/10/2018   Lab Results  Component Value Date   INSULIN 33.0 (H) 11/10/2018   INSULIN 25.0 (H) 07/09/2018   Assessment/Plan:   1. Vitamin D deficiency Low Vitamin D level contributes to fatigue and are associated with obesity, breast, and colon cancer.  2. Prediabetes Aashvi will continue to work on weight loss, exercise, and decreasing simple carbohydrates to help decrease the risk of diabetes.   3. Class 2 severe obesity with serious comorbidity and body mass index (BMI) of 38.0 to 38.9 in adult, unspecified obesity type ([facility]) Grover is currently in the action stage of change. As such, her goal is to continue with weight loss efforts. She has agreed to following a lower  carbohydrate, vegetable and lean protein rich diet plan.   Exercise goals: Older adults should follow the adult guidelines. When older adults cannot meet the adult guidelines, they should be as physically active as their abilities and conditions will allow.  Older adults should do exercises that maintain or improve balance if they are at risk of falling.   Behavioral modification strategies: meal planning and cooking strategies and keeping healthy foods in the home.  Emileigh has agreed to follow-up with our clinic in 2 weeks. She was informed of the importance of frequent follow-up visits to maximize her success with intensive lifestyle modifications for her multiple health conditions.   Objective:   Blood pressure (!) 143/82, pulse 81, temperature 98.3 F (36.8 C), temperature source Oral, height 5\' 4"  (1.626 m), weight 222 lb (100.7 kg), SpO2 99 %. Body mass index is 38.11 kg/m.  General: Cooperative, alert, well developed, in no acute distress. HEENT: Conjunctivae and lids unremarkable. Cardiovascular: Regular rhythm.  Lungs: Normal work of breathing. Neurologic: No focal deficits.   Lab Results  Component Value Date   CREATININE 0.91 11/10/2018   BUN 21 11/10/2018   NA 138 11/10/2018   K 3.8 11/10/2018   CL 98 11/10/2018   CO2 27 11/10/2018   Lab Results  Component Value Date   ALT 22 11/10/2018   AST 24 11/10/2018   ALKPHOS 46 11/10/2018   BILITOT 0.3 11/10/2018   Lab Results  Component Value Date   HGBA1C 5.4 11/10/2018   HGBA1C 5.8 (H) 07/09/2018  Lab Results  Component Value Date   INSULIN 33.0 (H) 11/10/2018   INSULIN 25.0 (H) 07/09/2018   Lab Results  Component Value Date   TSH 2.210 07/09/2018   Lab Results  Component Value Date   CHOL 158 07/09/2018   HDL 54 07/09/2018   LDLCALC 86 07/09/2018   TRIG 92 07/09/2018   Lab Results  Component Value Date   WBC 6.0 07/09/2018   HGB 13.6 07/09/2018   HCT 39.7 07/09/2018   MCV 89 07/09/2018   PLT  241 07/09/2018   Obesity Behavioral Intervention Documentation for Insurance:   Approximately 15 minutes were spent on the discussion below.  ASK: We discussed the diagnosis of obesity with Jermesha today and Maysie agreed to give [name] permission to discuss obesity behavioral modification therapy today.  ASSESS: Odena has the diagnosis of obesity and her BMI today is 38.2. Cherell is in the action stage of change.   ADVISE: Chade was educated on the multiple health risks of obesity as well as the benefit of weight loss to improve her health. She was advised of the need for long term treatment and the importance of lifestyle modifications to improve her current health and to decrease her risk of future health problems.  AGREE: Multiple dietary modification options and treatment options were discussed and Cheyla agreed to follow the recommendations documented in the above note.  ARRANGE: Tamera was educated on the importance of frequent visits to treat obesity as outlined per CMS and USPSTF guidelines and agreed to schedule her next follow up appointment today.  Attestation Statements:   Reviewed by clinician on day of visit: allergies, medications, problem list, medical history, surgical history, family history, social history, and previous encounter notes.  I, Water quality scientist, CMA, am acting as Location manager for [employer], PA-C.  I have reviewed the above documentation for accuracy and completeness, and I agree with the above. Abby Potash, PA-C

## 2019-02-25 ENCOUNTER — Ambulatory Visit: Payer: Medicare Other | Admitting: Physical Therapy

## 2019-02-25 ENCOUNTER — Encounter: Payer: Self-pay | Admitting: Physical Therapy

## 2019-02-25 ENCOUNTER — Other Ambulatory Visit: Payer: Self-pay

## 2019-02-25 DIAGNOSIS — M6281 Muscle weakness (generalized): Secondary | ICD-10-CM

## 2019-02-25 DIAGNOSIS — M545 Low back pain, unspecified: Secondary | ICD-10-CM

## 2019-02-25 DIAGNOSIS — R262 Difficulty in walking, not elsewhere classified: Secondary | ICD-10-CM

## 2019-02-25 DIAGNOSIS — G8929 Other chronic pain: Secondary | ICD-10-CM

## 2019-02-25 NOTE — Therapy (Signed)
[facility] [address]  [place], [place], [address] Phone: [phone]   Fax:  [phone]  Physical Therapy Discharge Summary  Patient Details  Name: Crystal Jackson MRN: [id number] Date of Birth: 07/28/1945 Referring Provider (PT): Erskine Emery, PA-C   Progress Note Reporting Period 01/22/19 to 02/25/19  See note below for Objective Data and Assessment of Progress/Goals.     Encounter Date: 02/25/2019  PT End of Session - 02/25/19 1451    Visit Number  5    Number of Visits  5    Date for PT Re-Evaluation  02/19/19    Authorization Type  [employer] Medicare    PT Start Time  6222    PT Stop Time  1449    PT Time Calculation (min)  50 min    Activity Tolerance  Patient tolerated treatment well    Behavior During Therapy  WFL for tasks assessed/performed       Past Medical History:  Diagnosis Date  . Anxiety   . Arthritis   . Asthma   . Back pain   . Bilateral swelling of feet   . Constipation   . Depression   . Gallbladder problem   . GERD (gastroesophageal reflux disease)   . History of low potassium   . Hypertension   . Joint pain   . Obesity   . Palpitations   . Shortness of breath   . Sleep apnea     Past Surgical History:  Procedure Laterality Date  . ABDOMINAL HYSTERECTOMY    . APPENDECTOMY    . CHOLECYSTECTOMY    . KNEE SURGERY Left   . SHOULDER SURGERY Right     There were no vitals filed for this visit.  Subjective Assessment - 02/25/19 1357    Subjective  Has lost her HEP sheets and requesting another copy. lLow back hasn't felt too bad lately- knees are giving her more trouble today. Wuld like to wrap up with PT today as she feels comfortable with her current exercise regimen. Reports 60-70% improvement.    Pertinent History  SOB, palpitations, HTN, GERD, depression, B feet swelling, back pain, asthma, anxiety, R shoulder surgery, L knee surgery    Diagnostic tests  10/10/18  lumbar MRI: Lumbar spondylosis and degenerative disc disease resulting in mild impingement at L3-4. 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 Degenerative perifacet edema at L4-5 and L5-S1 with facet jointeffusions bilaterally at L4-5.    Patient Stated Goals  "to be able to walk around the block without pain, be able to stand at home without having to sit down"    Pain Score  2    Pain Location  Knee    Pain Orientation  Right;Left    Pain Descriptors / Indicators  Aching    Pain Type  Chronic pain         [facility] PT Assessment - 02/25/19 0001      Observation/Other Assessments   Focus on Therapeutic Outcomes (FOTO)   Lumbar: 67 (33% limited, 44% predicted)      Strength   Right Hip Flexion  5/5    Right Hip Extension  4+/5    Right Hip External Rotation   4+/5    Right Hip Internal Rotation  4+/5    Right Hip ABduction  4+/5   audible cavitation in knees   Right Hip ADduction  4+/5    Left Hip Flexion  5/5    Left Hip Extension  4/5    Left Hip External Rotation  4+/5    Left Hip Internal Rotation  4+/5    Left Hip ABduction  4+/5   audible cavitation in knees   Left Hip ADduction  4+/5    Right Knee Flexion  5/5    Right Knee Extension  5/5    Left Knee Flexion  5/5    Left Knee Extension  4+/5   limited by crepitus   Right Ankle Dorsiflexion  4+/5    Right Ankle Plantar Flexion  4+/5    Left Ankle Dorsiflexion  4+/5    Left Ankle Plantar Flexion  4+/5                   [facility] Adult PT Treatment/Exercise - 02/25/19 0001      Exercises   Exercises  Knee/Hip      Lumbar Exercises: Aerobic   Nustep  Lvl 4, 6 min (LE only)       Lumbar Exercises: Supine   Bridge with clamshell  10 reps    Bridge with [employer] Limitations  + isometric hip abd/ER into green TB at knees       Lumbar Exercises: Sidelying   Clam  Right;Left;10 reps    Clam Limitations  red TB   cues to avoid rocking back     Knee/Hip Exercises: Standing   Hip Extension   Right;Left;10 reps;Knee straight;Stengthening    Extension Limitations  yellow TB at ankles;chair support   good posture     Knee/Hip Exercises: Seated   Other Seated Knee/Hip Exercises  sitting hip IR with yellow loop around ankles x10   more difficulty on L            PT Education - 02/25/19 1450    Education Details  update to HEP; discussion on objective progress and remaining impairments; discussion on maintaining consistency with HEP    Person(s) Educated  Patient    Methods  Explanation;Demonstration;Tactile cues;Verbal cues;Handout    Comprehension  Verbalized understanding;Returned demonstration       PT Short Term Goals - 02/25/19 1410      PT SHORT TERM GOAL #1   Title  Patient to be independent with initial HEP.    Time  2    Period  Weeks    Status  Achieved    Target Date  02/05/19        PT Long Term Goals - 02/25/19 1410      PT LONG TERM GOAL #1   Title  Patient to be independent with advanced HEP.    Time  4    Period  Weeks    Status  Achieved      PT LONG TERM GOAL #2   Title  Patient to report tolerance of 30 min of standing without pain limiting.    Time  4    Period  Weeks    Status  Partially Met   varies from 10-30 minutes     PT LONG TERM GOAL #3   Title  Patient to demonstrate B LE strength >=4+/5.    Time  4    Period  Weeks    Status  Partially Met   improvement in B hip IR and abduction     PT LONG TERM GOAL #4   Title  Patient to report tolerance of walking around her block without pain limiting.    Time  4    Period  Weeks  Status  Achieved   02/11/19:  achieved when she walked on 02/07/19           Plan - 02/25/19 1455    Clinical Impression Statement  Patient reporting 60-70% improvement in LB since initial eval. Notes that lately her knees have been giving her more trouble than her back. Patient has met or partially met all goals at this time and requesting to wrap up with PT today as she feels comfortable  with her exercise regimen. Notes tolerance for 10-30 min of standing before onset of pain. Also now able to walk around her block without pain. Strength testing revealed improvement in B hip IR and abduction; L hip extension most limiting. 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 Patient performed all ther-ex without complaints and reported understanding. Discussed strategies for staying motivated with exercises which patient appreciated. Patient is now D/C'd d/t progress with therapy.    Comorbidities  SOB, palpitations, HTN, GERD, depression, B feet swelling, back pain, asthma, anxiety, R shoulder surgery, L knee surgery    Rehab Potential  Good    PT Treatment/Interventions  ADLs/Self Care Home Management;Cryotherapy;Electrical Stimulation;Moist Heat;Traction;Balance training;Therapeutic exercise;Therapeutic activities;Functional mobility training;Stair training;Gait training;Ultrasound;Neuromuscular re-education;Patient/family education;Manual techniques;Vasopneumatic Device;Taping;Energy conservation;Dry needling;Passive range of motion    PT Next Visit Plan  DC at this time    Consulted and Agree with Plan of Care  Patient       Patient will benefit from skilled therapeutic intervention in order to improve the following deficits and impairments:  Decreased activity tolerance, Decreased strength, Pain, Improper body mechanics, Impaired flexibility, Postural dysfunction  Visit Diagnosis: Chronic bilateral low back pain without sciatica  Muscle weakness (generalized)  Difficulty in walking, not elsewhere classified     Problem List Patient Active Problem List   Diagnosis Date Noted  . OSA (obstructive sleep apnea) 09/03/2018  . Mild persistent asthma without complication 65/53/7482  . Severe obesity (BMI >= 40) ([place]) 04/14/2018  . Unilateral primary osteoarthritis, left knee 04/14/2018     PHYSICAL THERAPY DISCHARGE SUMMARY  Visits from Start of Care: 5  Current  functional level related to goals / functional outcomes: See above clinical impression   Remaining deficits: Decreased L hip extension strength; limited standing tolerance   Education / Equipment: HEP  Plan: Patient agrees to discharge.  Patient goals were partially met. Patient is being discharged due to meeting the stated rehab goals.  ?????     Janene Harvey, PT, DPT 02/25/19 3:05 PM   [facility] [address]  [place], [place], [address] Phone: [phone]   Fax:  [phone]  Name: Crystal Jackson MRN: [id number] Date of Birth: 18-Dec-1945

## 2019-02-26 ENCOUNTER — Encounter (INDEPENDENT_AMBULATORY_CARE_PROVIDER_SITE_OTHER): Payer: Self-pay | Admitting: Physician Assistant

## 2019-03-02 ENCOUNTER — Ambulatory Visit: Payer: Medicare Other | Admitting: Physical Medicine and Rehabilitation

## 2019-03-02 ENCOUNTER — Other Ambulatory Visit: Payer: Self-pay

## 2019-03-02 DIAGNOSIS — M47816 Spondylosis without myelopathy or radiculopathy, lumbar region: Secondary | ICD-10-CM | POA: Diagnosis not present

## 2019-03-02 NOTE — Telephone Encounter (Signed)
Please advise 

## 2019-03-03 ENCOUNTER — Encounter: Payer: Self-pay | Admitting: Physical Medicine and Rehabilitation

## 2019-03-03 NOTE — Progress Notes (Signed)
Patient came in today for evaluation management of axial low back pain with some relief in the past with interventional spine procedure.  She has been having low back pain worsening the MRI showing mainly facet arthropathy.  She received her vaccine yesterday the first dose of the Pfizer vaccine.  We talked at great length today about recommendations regarding steroid medications and injections around the time of the vaccine and immune modulating quality of steroids.  We elected to reschedule her till after her second dose of vaccine.  We talked about the use of modalities and current medications until that time.  She knows she can call between now and then if something were to worsen.  Brief exam was consistent with facet mediated low back pain worse with facet loading and she had good strength and she ambulates without aid.

## 2019-03-11 ENCOUNTER — Ambulatory Visit: Payer: Self-pay

## 2019-03-11 ENCOUNTER — Ambulatory Visit (INDEPENDENT_AMBULATORY_CARE_PROVIDER_SITE_OTHER): Payer: Medicare Other | Admitting: Physician Assistant

## 2019-03-11 ENCOUNTER — Encounter: Payer: Self-pay | Admitting: Physician Assistant

## 2019-03-11 ENCOUNTER — Ambulatory Visit: Payer: Medicare Other | Admitting: Physician Assistant

## 2019-03-11 ENCOUNTER — Other Ambulatory Visit: Payer: Self-pay

## 2019-03-11 DIAGNOSIS — M1711 Unilateral primary osteoarthritis, right knee: Secondary | ICD-10-CM

## 2019-03-11 DIAGNOSIS — M1712 Unilateral primary osteoarthritis, left knee: Secondary | ICD-10-CM

## 2019-03-11 MED ORDER — METHYLPREDNISOLONE ACETATE 40 MG/ML IJ SUSP
40.0000 mg | INTRAMUSCULAR | Status: AC | PRN
  Administered 2019-03-11: 40 mg via INTRA_ARTICULAR

## 2019-03-11 MED ORDER — LIDOCAINE HCL 1 % IJ SOLN
0.5000 mL | INTRAMUSCULAR | Status: AC | PRN
  Administered 2019-03-11: 17:00:00 .5 mL

## 2019-03-11 MED ORDER — LIDOCAINE HCL 1 % IJ SOLN
5.0000 mL | INTRAMUSCULAR | Status: AC | PRN
  Administered 2019-03-11: 17:00:00 5 mL

## 2019-03-11 MED ORDER — METHYLPREDNISOLONE ACETATE 40 MG/ML IJ SUSP
40.0000 mg | INTRAMUSCULAR | Status: AC | PRN
  Administered 2019-03-11: 17:00:00 40 mg via INTRA_ARTICULAR

## 2019-03-11 NOTE — Progress Notes (Signed)
Office Visit Note   [name]           Date of Birth: 1945/04/24           MRN: [id number] Visit Date: 03/11/2019              Requested by: Drake Leach, MD [address] [place],  [place] [address] PCP: Drake Leach, MD   Assessment & Plan: Visit Diagnoses:  1. Primary osteoarthritis of right knee   2. Primary osteoarthritis of left knee     Plan: We will see her back in 4 weeks to see how she is doing.  Knee was wrapped on the right with an Ace bandage which she will leave until this evening then removed.  She will work on Forensic scientist.  Continue her Voltaren gel.  Questions encouraged and answered  Follow-Up Instructions: Return in about 4 weeks (around 04/08/2019).   Orders:  Orders Placed This Encounter  Procedures  . Large Joint Inj  . XR Knee 1-2 Views Right  . XR Knee 1-2 Views Left   No orders of the defined types were placed in this encounter.     Procedures: Large Joint Inj: bilateral knee on 03/11/2019 5:19 PM Indications: pain Details: 22 G 1.5 in needle, anterolateral approach  Arthrogram: No  Medications (Right): 5 mL lidocaine 1 %; 40 mg methylPREDNISolone acetate 40 MG/ML Aspirate (Right): 87 mL yellow and blood-tinged Medications (Left): 0.5 mL lidocaine 1 %; 40 mg methylPREDNISolone acetate 40 MG/ML Outcome: tolerated well, no immediate complications Procedure, treatment alternatives, risks and benefits explained, specific risks discussed. Consent was given by the patient. Immediately prior to procedure a time out was called to verify the correct patient, procedure, equipment, support staff and site/side marked as required. Patient was prepped and draped in the usual sterile fashion.       Clinical Data: No additional findings.   Subjective: Chief Complaint  Patient presents with  . Right Knee - Pain  . Left Knee - Pain    HPI Crystal Jackson comes in today due to bilateral knee pain.  She has had no particular  injury to either knee.  However 2 weeks ago she sat in a car for several hours and also she went to a football game of her grandson and sat on some low bleachers.  Right knee pain is worse than the left.  She had no prior problems with the right knee.  She has had problems with left knee in the past.  Most of her pain in both knees anterior aspect.  Right knee does give way with her at times.  She also notes that the right knee has been swelling on her.  Has been using Voltaren gel and heat with some relief.  She tried over-the-counter braces for her knees which these does tend to slide down her legs. Review of Systems No fevers or chills.  Objective: Vital Signs: There were no vitals taken for this visit.  Physical Exam Constitutional:      Appearance: She is not ill-appearing or diaphoretic.  Pulmonary:     Effort: Pulmonary effort is normal.  Neurological:     Mental Status: She is alert and oriented to person, place, and time.  Psychiatric:        Behavior: Behavior normal.     Ortho Exam Bilateral knees good range of motion both knees.  She has tenderness medial lateral joint line of the right knee.  Positive effusion right knee.  Negative for erythema, ecchymosis or abnormal warmth of either knee.  Left knee no effusion.  Good range of motion of both knees.  No instability valgus varus stressing of either knee but does cause pain.  She Ellena E Specialty Comments:  No specialty comments available.  Imaging: XR Knee 1-2 Views Left  Result Date: 03/11/2019 Left knee 2 views: No acute fracture.  Moderate severe patellofemoral changes moderate medial joint line narrowing mild lateral joint line narrowing.  No subluxation dislocation.  XR Knee 1-2 Views Right  Result Date: 03/11/2019 Right knee AP lateral views: No acute fracture.  Moderately severe patellofemoral changes.  Mild lateral compartmental changes.  Medial compartment with moderate narrowing.  No subluxation  dislocation.    PMFS History: Patient Active Problem List   Diagnosis Date Noted  . OSA (obstructive sleep apnea) 09/03/2018  . Mild persistent asthma without complication 09/03/2018  . Severe obesity (BMI >= 40) ([facility]) 04/14/2018  . Unilateral primary osteoarthritis, left knee 04/14/2018   Past Medical History:  Diagnosis Date  . Anxiety   . Arthritis   . Asthma   . Back pain   . Bilateral swelling of feet   . Constipation   . Depression   . Gallbladder problem   . GERD (gastroesophageal reflux disease)   . History of low potassium   . Hypertension   . Joint pain   . Obesity   . Palpitations   . Shortness of breath   . Sleep apnea     Family History  Problem Relation Age of Onset  . Diabetes Mother   . Heart disease Mother   . Anxiety disorder Mother   . Obesity Mother   . Diabetes Father   . Hypertension Father   . Obesity Father     Past Surgical History:  Procedure Laterality Date  . ABDOMINAL HYSTERECTOMY    . APPENDECTOMY    . CHOLECYSTECTOMY    . KNEE SURGERY Left   . SHOULDER SURGERY Right    Social History   Occupational History  . Not on file  Tobacco Use  . Smoking status: Never Smoker  . Smokeless tobacco: Never Used  Substance and Sexual Activity  . Alcohol use: No    Alcohol/week: 0.0 standard drinks  . Drug use: No  . Sexual activity: Not on file

## 2019-03-18 ENCOUNTER — Other Ambulatory Visit (INDEPENDENT_AMBULATORY_CARE_PROVIDER_SITE_OTHER): Payer: Self-pay | Admitting: Physician Assistant

## 2019-03-18 DIAGNOSIS — I10 Essential (primary) hypertension: Secondary | ICD-10-CM

## 2019-03-19 ENCOUNTER — Encounter (INDEPENDENT_AMBULATORY_CARE_PROVIDER_SITE_OTHER): Payer: Self-pay | Admitting: Bariatrics

## 2019-03-19 ENCOUNTER — Other Ambulatory Visit: Payer: Self-pay

## 2019-03-19 ENCOUNTER — Ambulatory Visit (INDEPENDENT_AMBULATORY_CARE_PROVIDER_SITE_OTHER): Payer: Medicare Other | Admitting: Bariatrics

## 2019-03-19 VITALS — BP 130/78 | HR 71 | Temp 97.8°F | Ht 64.0 in | Wt 220.0 lb

## 2019-03-19 DIAGNOSIS — I10 Essential (primary) hypertension: Secondary | ICD-10-CM

## 2019-03-19 DIAGNOSIS — Z6837 Body mass index (BMI) 37.0-37.9, adult: Secondary | ICD-10-CM | POA: Diagnosis not present

## 2019-03-19 DIAGNOSIS — E8881 Metabolic syndrome: Secondary | ICD-10-CM

## 2019-03-23 ENCOUNTER — Encounter (INDEPENDENT_AMBULATORY_CARE_PROVIDER_SITE_OTHER): Payer: Self-pay | Admitting: Bariatrics

## 2019-03-23 ENCOUNTER — Other Ambulatory Visit (INDEPENDENT_AMBULATORY_CARE_PROVIDER_SITE_OTHER): Payer: Self-pay | Admitting: Bariatrics

## 2019-03-23 DIAGNOSIS — I10 Essential (primary) hypertension: Secondary | ICD-10-CM

## 2019-03-23 MED ORDER — CHLORTHALIDONE 25 MG PO TABS: 12.5000 mg | ORAL_TABLET | Freq: Every day | ORAL | 0 refills | Status: DC

## 2019-03-23 NOTE — Progress Notes (Signed)
Chief Complaint:   OBESITY Crystal Jackson is here to discuss her progress with her obesity treatment plan along with follow-up of her obesity related diagnoses. Crystal Jackson is on the Category 2 Plan and states she is following her eating plan approximately 60-70% of the time.   Today's visit was #: 51 Starting weight: 242 lbs Starting date: 07/09/2018 Today's weight: 220 lbs Today's date: 03/19/2019 Total lbs lost to date: 22 Total lbs lost since last in-office visit: 2  Interim History: Crystal Jackson is down an additional 2 lbs since her last visit.  Subjective:   Essential hypertension. Blood pressure is well controlled.  BP Readings from Last 3 Encounters:  03/19/19 130/78  02/23/19 (!) 143/82  02/09/19 126/75   Lab Results  Component Value Date   CREATININE 0.91 11/10/2018   CREATININE 1.07 (H) 08/25/2018   CREATININE 0.98 07/09/2018   Insulin resistance. Crystal Jackson has a diagnosis of insulin resistance based on her elevated fasting insulin level >5. She continues to work on diet and exercise to decrease her risk of diabetes. No polyphagia. Insulin was 13.4 on 03/11/2019.  Lab Results  Component Value Date   INSULIN 33.0 (H) 11/10/2018   INSULIN 25.0 (H) 07/09/2018   Lab Results  Component Value Date   HGBA1C 5.4 11/10/2018   Assessment/Plan:   Essential hypertension. Crystal Jackson is working on healthy weight loss and exercise to improve blood pressure control. We will watch for signs of hypotension as she continues her lifestyle modifications. She was given a prescription for chlorthalidone (HYGROTON) 25 MG tablet take 0.5 tablet (12.5 mg total) PO daily #30 with 0 refills.  Insulin resistance. Crystal Jackson will continue to work on weight loss, increasing exercise, increasing healthy fats and protein, and decreasing simple carbohydrates to help decrease the risk of diabetes. Crystal Jackson agreed to follow-up with [name] as directed to closely monitor her progress.  Class 2 severe obesity with  serious comorbidity and body mass index (BMI) of 37.0 to 37.9 in adult, unspecified obesity type (Crystal Jackson).  Crystal Jackson is currently in the action stage of change. As such, her goal is to continue with weight loss efforts. She has agreed to the Category 2 Plan alternating with the Pescatarian plan.  She will work on meal planning, mindful eating, and increasing her water intake.   Exercise goals: Older adults should follow the adult guidelines. When older adults cannot meet the adult guidelines, they should be as physically active as their abilities and conditions will allow.   Behavioral modification strategies: increasing lean protein intake, decreasing simple carbohydrates, increasing vegetables, increasing water intake, decreasing eating out, no skipping meals, meal planning and cooking strategies, keeping healthy foods in the home and planning for success.  Crystal Jackson has agreed to follow-up with our clinic in 2-3 weeks. She was informed of the importance of frequent follow-up visits to maximize her success with intensive lifestyle modifications for her multiple health conditions.   Objective:   Blood pressure 130/78, pulse 71, temperature 97.8 F (36.6 C), height 5\' 4"  (1.626 m), weight 220 lb (99.8 kg), SpO2 98 %. Body mass index is 37.76 kg/m.  General: Cooperative, alert, well developed, in no acute distress. HEENT: Conjunctivae and lids unremarkable. Cardiovascular: Regular rhythm.  Lungs: Normal work of breathing. Neurologic: No focal deficits.   Lab Results  Component Value Date   CREATININE 0.91 11/10/2018   BUN 21 11/10/2018   NA 138 11/10/2018   K 3.8 11/10/2018   CL 98 11/10/2018   CO2 27 11/10/2018  Lab Results  Component Value Date   ALT 22 11/10/2018   AST 24 11/10/2018   ALKPHOS 46 11/10/2018   BILITOT 0.3 11/10/2018   Lab Results  Component Value Date   HGBA1C 5.4 11/10/2018   HGBA1C 5.8 (H) 07/09/2018   Lab Results  Component Value Date   INSULIN 33.0 (H)  11/10/2018   INSULIN 25.0 (H) 07/09/2018   Lab Results  Component Value Date   TSH 2.210 07/09/2018   Lab Results  Component Value Date   CHOL 158 07/09/2018   HDL 54 07/09/2018   LDLCALC 86 07/09/2018   TRIG 92 07/09/2018   Lab Results  Component Value Date   WBC 6.0 07/09/2018   HGB 13.6 07/09/2018   HCT 39.7 07/09/2018   MCV 89 07/09/2018   PLT 241 07/09/2018   No results found for: IRON, TIBC, FERRITIN  Obesity Behavioral Intervention Documentation for Insurance:   Approximately 15 minutes were spent on the discussion below.  ASK: We discussed the diagnosis of obesity with Crystal Jackson today and Crystal Jackson agreed to give [name] permission to discuss obesity behavioral modification therapy today.  ASSESS: Crystal Jackson has the diagnosis of obesity and her BMI today is 37.9. Crystal Jackson is in the action stage of change.   ADVISE: Crystal Jackson was educated on the multiple health risks of obesity as well as the benefit of weight loss to improve her health. She was advised of the need for long term treatment and the importance of lifestyle modifications to improve her current health and to decrease her risk of future health problems.  AGREE: Multiple dietary modification options and treatment options were discussed and Crystal Jackson agreed to follow the recommendations documented in the above note.  ARRANGE: Crystal Jackson was educated on the importance of frequent visits to treat obesity as outlined per CMS and USPSTF guidelines and agreed to schedule her next follow up appointment today.  Attestation Statements:   Reviewed by clinician on day of visit: allergies, medications, problem list, medical history, surgical history, family history, social history, and previous encounter notes.  Fernanda Drum, am acting as Energy manager for [employer], DO   I have reviewed the above documentation for accuracy and completeness, and I agree with the above. Corinna Capra, DO

## 2019-03-30 ENCOUNTER — Telehealth: Payer: Self-pay | Admitting: Radiology

## 2019-03-30 NOTE — Telephone Encounter (Signed)
Patient states that she is scheduled injection with Dr. Alvester Morin on 04/07/2019, however she is scheduled for her 2nd COVID injection on 03/31/2019 and she wanted to let us know incase she needs to r/s---please advise.

## 2019-03-30 NOTE — Telephone Encounter (Signed)
2 weeks after 2nd vaccine injection is reccomended

## 2019-03-31 NOTE — Telephone Encounter (Signed)
I moved her appt to 04/20/2019 at her request

## 2019-04-06 ENCOUNTER — Encounter (INDEPENDENT_AMBULATORY_CARE_PROVIDER_SITE_OTHER): Payer: Self-pay | Admitting: Physician Assistant

## 2019-04-06 ENCOUNTER — Ambulatory Visit (INDEPENDENT_AMBULATORY_CARE_PROVIDER_SITE_OTHER): Payer: Medicare Other | Admitting: Physician Assistant

## 2019-04-06 ENCOUNTER — Other Ambulatory Visit: Payer: Self-pay

## 2019-04-06 ENCOUNTER — Encounter: Payer: Medicare Other | Admitting: Physical Medicine and Rehabilitation

## 2019-04-06 VITALS — BP 116/72 | HR 73 | Temp 97.9°F | Ht 64.0 in | Wt 222.0 lb

## 2019-04-06 DIAGNOSIS — R7303 Prediabetes: Secondary | ICD-10-CM

## 2019-04-06 DIAGNOSIS — Z6838 Body mass index (BMI) 38.0-38.9, adult: Secondary | ICD-10-CM | POA: Diagnosis not present

## 2019-04-06 DIAGNOSIS — E559 Vitamin D deficiency, unspecified: Secondary | ICD-10-CM

## 2019-04-06 NOTE — Progress Notes (Signed)
Chief Complaint:   OBESITY Crystal Jackson is here to discuss her progress with her obesity treatment plan along with follow-up of her obesity related diagnoses. Crystal Jackson is on the Category 2 Plan or the [place] and states she is following her eating plan approximately 75% of the time. Crystal Jackson states she is doing 0 minutes 0 times per week.  Today's visit was #: 62 Starting weight: 242 lbs Starting date: 07/09/2018 Today's weight: 222 lbs Today's date: 04/06/2019 Total lbs lost to date: 20 Total lbs lost since last in-office visit: 0  Interim History: Crystal Jackson states that she feels like she has lost her will power. Her husband is very picky with his food choices and she cooks to appease him many times.  Subjective:   1. Vitamin D deficiency Crystal Jackson is on calcium and Vit D combination. She denies nausea, vomiting, or muscle weakness.  2. Pre-diabetes Crystal Jackson is not on medications, and she denies polyphagia. Last A1c was 5.8.  Assessment/Plan:   1. Vitamin D deficiency Low Vitamin D level contributes to fatigue and are associated with obesity, breast, and colon cancer. Bailea agreed to continue taking calcium plus Vit D and will follow-up for routine testing of Vitamin D, at least 2-3 times per year to avoid over-replacement.  2. Pre-diabetes Crystal Jackson will continue to work on weight loss, exercise, and decreasing simple carbohydrates to help decrease the risk of diabetes.   3. Class 2 severe obesity with serious comorbidity and body mass index (BMI) of 38.0 to 38.9 in adult, unspecified obesity type (HCC) Crystal Jackson is currently in the action stage of change. As such, her goal is to continue with weight loss efforts. She has agreed to the Category 2 Plan and keeping a food journal and adhering to recommended goals of 400-500 calories and 35 grams of protein at supper daily.   Exercise goals: No exercise has been prescribed at this time.  Behavioral modification strategies: meal  planning and cooking strategies and planning for success.  Crystal Jackson has agreed to follow-up with our clinic in 2 weeks. She was informed of the importance of frequent follow-up visits to maximize her success with intensive lifestyle modifications for her multiple health conditions.   Objective:   Blood pressure 116/72, pulse 73, temperature 97.9 F (36.6 C), temperature source Oral, height 5\' 4"  (1.626 m), weight 222 lb (100.7 kg), SpO2 98 %. Body mass index is 38.11 kg/m.  General: Cooperative, alert, well developed, in no acute distress. HEENT: Conjunctivae and lids unremarkable. Cardiovascular: Regular rhythm.  Lungs: Normal work of breathing. Neurologic: No focal deficits.   Lab Results  Component Value Date   CREATININE 0.91 11/10/2018   BUN 21 11/10/2018   NA 138 11/10/2018   K 3.8 11/10/2018   CL 98 11/10/2018   CO2 27 11/10/2018   Lab Results  Component Value Date   ALT 22 11/10/2018   AST 24 11/10/2018   ALKPHOS 46 11/10/2018   BILITOT 0.3 11/10/2018   Lab Results  Component Value Date   HGBA1C 5.4 11/10/2018   HGBA1C 5.8 (H) 07/09/2018   Lab Results  Component Value Date   INSULIN 33.0 (H) 11/10/2018   INSULIN 25.0 (H) 07/09/2018   Lab Results  Component Value Date   TSH 2.210 07/09/2018   Lab Results  Component Value Date   CHOL 158 07/09/2018   HDL 54 07/09/2018   LDLCALC 86 07/09/2018   TRIG 92 07/09/2018   Lab Results  Component Value Date   WBC 6.0  07/09/2018   HGB 13.6 07/09/2018   HCT 39.7 07/09/2018   MCV 89 07/09/2018   PLT 241 07/09/2018   No results found for: IRON, TIBC, FERRITIN  Obesity Behavioral Intervention Documentation for Insurance:   Approximately 15 minutes were spent on the discussion below.  ASK: We discussed the diagnosis of obesity with Crystal Jackson today and Crystal Jackson agreed to give [name] permission to discuss obesity behavioral modification therapy today.  ASSESS: Crystal Jackson has the diagnosis of obesity and her BMI today  is 38.09. Crystal Jackson is in the action stage of change.   ADVISE: Crystal Jackson was educated on the multiple health risks of obesity as well as the benefit of weight loss to improve her health. She was advised of the need for long term treatment and the importance of lifestyle modifications to improve her current health and to decrease her risk of future health problems.  AGREE: Multiple dietary modification options and treatment options were discussed and Crystal Jackson agreed to follow the recommendations documented in the above note.  ARRANGE: Crystal Jackson was educated on the importance of frequent visits to treat obesity as outlined per CMS and USPSTF guidelines and agreed to schedule her next follow up appointment today.  Attestation Statements:   Reviewed by clinician on day of visit: allergies, medications, problem list, medical history, surgical history, family history, social history, and previous encounter notes.   [name], am acting as transcriptionist for [employer], PA-C.  I have reviewed the above documentation for accuracy and completeness, and I agree with the above. [name], PA-C

## 2019-04-07 ENCOUNTER — Encounter: Payer: Medicare Other | Admitting: Physical Medicine and Rehabilitation

## 2019-04-08 ENCOUNTER — Other Ambulatory Visit: Payer: Self-pay

## 2019-04-08 ENCOUNTER — Encounter: Payer: Self-pay | Admitting: Physician Assistant

## 2019-04-08 ENCOUNTER — Ambulatory Visit: Payer: Medicare Other | Admitting: Physician Assistant

## 2019-04-08 DIAGNOSIS — M1712 Unilateral primary osteoarthritis, left knee: Secondary | ICD-10-CM | POA: Diagnosis not present

## 2019-04-08 DIAGNOSIS — M1711 Unilateral primary osteoarthritis, right knee: Secondary | ICD-10-CM

## 2019-04-08 NOTE — Progress Notes (Signed)
Office Visit Note   [name]           Date of Birth: [birth year]           MRN: [id number] Visit Date: 04/08/2019              Requested by: Drake Leach, MD [address] [place],   [address] PCP: Drake Leach, MD   Assessment & Plan: Visit Diagnoses:  1. Primary osteoarthritis of right knee   2. Primary osteoarthritis of left knee     Plan:  Reminded her that she needs to wait at least 3 months between injections in her knees.  She will follow-up with [name] on an as-needed basis.  Did place an Ace bandage on her knee after aspiration of the knee today.  We obtain 50 cc of synovial fluid from the knee patient tolerated well.  She will remove the Ace bandage tonight before going to bed.  Follow-Up Instructions: Return if symptoms worsen or fail to improve.   Orders:  No orders of the defined types were placed in this encounter.  No orders of the defined types were placed in this encounter.     Procedures: No procedures performed   Clinical Data: No additional findings.   Subjective: Chief Complaint  Patient presents with  . Right Knee - Follow-up  . Left Knee - Follow-up    HPI Mrs. Tkach returns today status post bilateral knee injections 03/11/2019.  States that the right knee is doing much better.  She has had no swelling in the knee.  Left knee is better but it is still sore.  She notes some swelling of the knee.  Pain in the left knee is worse after sitting for long periods of time.  She has had no new injury to the knee.  She has known osteoarthritis of the left knee with moderate to severe patellofemoral changes and moderate medial joint line narrowing.  Review of Systems See HPI  Objective: Vital Signs: There were no vitals taken for this visit.  Physical Exam Constitutional:      Appearance: She is not ill-appearing or diaphoretic.  Neurological:     Mental Status: She is alert and oriented to person, place, and time.    Psychiatric:        Mood and Affect: Mood normal.     Ortho Exam Right knee good range of motion without pain.  Left knee positive effusion no abnormal warmth.  Good range of motion tenderness along medial joint line. Specialty Comments:  No specialty comments available.  Imaging: No results found.   PMFS History: Patient Active Problem List   Diagnosis Date Noted  . OSA (obstructive sleep apnea) 09/03/2018  . Mild persistent asthma without complication 70/01/7492  . Severe obesity (BMI >= 40) ([place]) 04/14/2018  . Unilateral primary osteoarthritis, left knee 04/14/2018   Past Medical History:  Diagnosis Date  . Anxiety   . Arthritis   . Asthma   . Back pain   . Bilateral swelling of feet   . Constipation   . Depression   . Gallbladder problem   . GERD (gastroesophageal reflux disease)   . History of low potassium   . Hypertension   . Joint pain   . Obesity   . Palpitations   . Shortness of breath   . Sleep apnea     Family History  Problem Relation Age of Onset  . Diabetes Mother   . Heart disease Mother   .  Anxiety disorder Mother   . Obesity Mother   . Diabetes Father   . Hypertension Father   . Obesity Father     Past Surgical History:  Procedure Laterality Date  . ABDOMINAL HYSTERECTOMY    . APPENDECTOMY    . CHOLECYSTECTOMY    . KNEE SURGERY Left   . SHOULDER SURGERY Right    Social History   Occupational History  . Not on file  Tobacco Use  . Smoking status: Never Smoker  . Smokeless tobacco: Never Used  Substance and Sexual Activity  . Alcohol use: No    Alcohol/week: 0.0 standard drinks  . Drug use: No  . Sexual activity: Not on file

## 2019-04-20 ENCOUNTER — Encounter: Payer: Medicare Other | Admitting: Physical Medicine and Rehabilitation

## 2019-04-22 ENCOUNTER — Ambulatory Visit (INDEPENDENT_AMBULATORY_CARE_PROVIDER_SITE_OTHER): Payer: Medicare Other | Admitting: Physician Assistant

## 2019-04-22 ENCOUNTER — Encounter (INDEPENDENT_AMBULATORY_CARE_PROVIDER_SITE_OTHER): Payer: Self-pay | Admitting: Physician Assistant

## 2019-04-22 ENCOUNTER — Other Ambulatory Visit: Payer: Self-pay

## 2019-04-22 VITALS — BP 111/69 | HR 60 | Temp 98.1°F | Ht 64.0 in | Wt 222.0 lb

## 2019-04-22 DIAGNOSIS — F3341 Major depressive disorder, recurrent, in partial remission: Secondary | ICD-10-CM

## 2019-04-22 DIAGNOSIS — I10 Essential (primary) hypertension: Secondary | ICD-10-CM | POA: Diagnosis not present

## 2019-04-22 DIAGNOSIS — Z6838 Body mass index (BMI) 38.0-38.9, adult: Secondary | ICD-10-CM | POA: Diagnosis not present

## 2019-04-22 NOTE — Progress Notes (Signed)
Chief Complaint:   OBESITY Crystal Jackson is here to discuss her progress with her obesity treatment plan along with follow-up of her obesity related diagnoses. Crystal Jackson is on the Category 2 Plan and journaling 400-500 calories and 35 grams of protein at supper and states she is following her eating plan approximately 75% of the time. Crystal Jackson states she is walking 10 minutes 2-3 times per week.  Today's visit was #: 18 Starting weight: 242 lbs Starting date: 07/09/2018 Today's weight: 222 lbs Today's date: 04/22/2019 Total lbs lost to date: 20 Total lbs lost since last in-office visit: 0  Interim History: Crystal Jackson reports that she sometimes feels overwhelmed with taking care of her husband (who is a picky eater) and dealing with back pain and knee pain.  Subjective:   Essential hypertension. Blood pressure is controlled today. No headache or chest pain. Crystal Jackson is on chlorthalidone.  BP Readings from Last 3 Encounters:  04/22/19 111/69  04/06/19 116/72  03/19/19 130/78   Lab Results  Component Value Date   CREATININE 0.91 11/10/2018   CREATININE 1.07 (H) 08/25/2018   CREATININE 0.98 07/09/2018   Major depressive disorder, recurrent episode, in partial remission (HCC). Crystal Jackson is on Effexor. No suicidal or homicidal ideations.  Assessment/Plan:   Essential hypertension. [name] is working on healthy weight loss and exercise to improve blood pressure control. We will watch for signs of hypotension as she continues her lifestyle modifications. She will continue her medication as directed.  Major depressive disorder, recurrent episode, in partial remission (HCC). Will schedule an appointment with Dr. Dewaine Conger, our bariatric psychologist, for evaluation.  Class 2 severe obesity with serious comorbidity and body mass index (BMI) of 38.0 to 38.9 in adult, unspecified obesity type (HCC).  Crystal Jackson is currently in the action stage of change. As such, her goal is to continue with weight  loss efforts. She has agreed to the Category 2 Plan and will journal 400-500 calories + 35 grams of protein at supper.   Exercise goals: Older adults should follow the adult guidelines. When older adults cannot meet the adult guidelines, they should be as physically active as their abilities and conditions will allow.   Behavioral modification strategies: increasing lean protein intake and meal planning and cooking strategies.  Crystal Jackson has agreed to follow-up with our clinic in 2 weeks. She was informed of the importance of frequent follow-up visits to maximize her success with intensive lifestyle modifications for her multiple health conditions.   Objective:   Blood pressure 111/69, pulse 60, temperature 98.1 F (36.7 C), temperature source Oral, height 5\' 4"  (1.626 m), weight 222 lb (100.7 kg), SpO2 98 %. Body mass index is 38.11 kg/m.  General: Cooperative, alert, well developed, in no acute distress. HEENT: Conjunctivae and lids unremarkable. Cardiovascular: Regular rhythm.  Lungs: Normal work of breathing. Neurologic: No focal deficits.   Lab Results  Component Value Date   CREATININE 0.91 11/10/2018   BUN 21 11/10/2018   NA 138 11/10/2018   K 3.8 11/10/2018   CL 98 11/10/2018   CO2 27 11/10/2018   Lab Results  Component Value Date   ALT 22 11/10/2018   AST 24 11/10/2018   ALKPHOS 46 11/10/2018   BILITOT 0.3 11/10/2018   Lab Results  Component Value Date   HGBA1C 5.4 11/10/2018   HGBA1C 5.8 (H) 07/09/2018   Lab Results  Component Value Date   INSULIN 33.0 (H) 11/10/2018   INSULIN 25.0 (H) 07/09/2018   Lab Results  Component Value  Date   TSH 2.210 07/09/2018   Lab Results  Component Value Date   CHOL 158 07/09/2018   HDL 54 07/09/2018   LDLCALC 86 07/09/2018   TRIG 92 07/09/2018   Lab Results  Component Value Date   WBC 6.0 07/09/2018   HGB 13.6 07/09/2018   HCT 39.7 07/09/2018   MCV 89 07/09/2018   PLT 241 07/09/2018   No results found for: IRON,  TIBC, FERRITIN  Obesity Behavioral Intervention Documentation for Insurance:   Approximately 15 minutes were spent on the discussion below.  ASK: We discussed the diagnosis of obesity with Kiylee today and Ermagene agreed to give [name] permission to discuss obesity behavioral modification therapy today.  ASSESS: Crystal Jackson has the diagnosis of obesity and her BMI today is 38.1. Sakshi is in the action stage of change.   ADVISE: Crystal Jackson was educated on the multiple health risks of obesity as well as the benefit of weight loss to improve her health. She was advised of the need for long term treatment and the importance of lifestyle modifications to improve her current health and to decrease her risk of future health problems.  AGREE: Multiple dietary modification options and treatment options were discussed and Renly agreed to follow the recommendations documented in the above note.  ARRANGE: Crystal Jackson was educated on the importance of frequent visits to treat obesity as outlined per CMS and USPSTF guidelines and agreed to schedule her next follow up appointment today.  Attestation Statements:   Reviewed by clinician on day of visit: allergies, medications, problem list, medical history, surgical history, family history, social history, and previous encounter notes.  IMichaelene Song, am acting as transcriptionist for Abby Potash, PA-C   I have reviewed the above documentation for accuracy and completeness, and I agree with the above. Abby Potash, PA-C

## 2019-04-28 ENCOUNTER — Ambulatory Visit: Payer: Medicare Other | Admitting: Physical Medicine and Rehabilitation

## 2019-04-28 ENCOUNTER — Other Ambulatory Visit: Payer: Self-pay

## 2019-04-28 ENCOUNTER — Ambulatory Visit: Payer: Self-pay

## 2019-04-28 ENCOUNTER — Encounter: Payer: Self-pay | Admitting: Physical Medicine and Rehabilitation

## 2019-04-28 VITALS — BP 125/76 | HR 72

## 2019-04-28 DIAGNOSIS — M47816 Spondylosis without myelopathy or radiculopathy, lumbar region: Secondary | ICD-10-CM

## 2019-04-28 MED ORDER — METHYLPREDNISOLONE ACETATE 80 MG/ML IJ SUSP
40.0000 mg | Freq: Once | INTRAMUSCULAR | Status: DC
Start: 2019-04-28 — End: 2021-03-10

## 2019-04-28 NOTE — Progress Notes (Signed)
Pt states pain across the lower back. Pt states pain started awhile ago. Pt states last injection 11/17/18 helped some and felt better , but all pain was not relieved. Pt states standing and twisting makes pain worse. Sitting and relaxing helps with pain.   .Numeric Pain Rating Scale and Functional Assessment Average Pain 4   In the last MONTH (on 0-10 scale) has pain interfered with the following?  1. General activity like being  able to carry out your everyday physical activities such as walking, climbing stairs, carrying groceries, or moving a chair?  Rating(5)   +Driver, -BT, -Dye Allergies.

## 2019-04-28 NOTE — Progress Notes (Signed)
Office: [phone]  /  Fax: [phone]    Date: May 12, 2019   Appointment Start Time: 3:41pm Duration: 32 minutes Provider: Glennie Isle, Psy.D. Type of Session: Individual Therapy  Location of Patient: Home Location of Provider: Provider's Home Type of Contact: Telepsychological Visit via MyChart Video Visit  Session Content: Crystal Jackson is a 74 y.o. female presenting via MyChart Video Visit for a follow-up appointment to address the previously established treatment goal of decreasing emotional eating. Today's appointment was a telepsychological visit due to COVID-19. Crystal Jackson provided verbal consent for today's telepsychological appointment and she is aware she is responsible for securing confidentiality on her end of the session. Prior to proceeding with today's appointment, Crystal Jackson's physical location at the time of this appointment was obtained as well a phone number she could be reached at in the event of technical difficulties. Crystal Jackson and this provider participated in today's telepsychological service.   Sayda acknowledged understanding that for today's appointment and future telepsychological appointments, MyChart Video Visit will be utilized. She also verbally acknowledged understanding that the information outlined in the telepsychological informed consent form signed at the onset of treatment would still be applicable despite the change in the videoconferencing platform.   This provider conducted a brief check-in and verbally administered the PHQ-9 and GAD-7. Crystal Jackson discussed being "discouraged" with her weight loss since the holidays in 2020. She noted she wonders if there is something "underlying" that is keeping her stuck. She also discussed feeling "aggravated" with her husband and his eating habits and symptoms of dementia. This provider recommended longer-term therapeutic services due to current stressors. Crystal Jackson requested to meet with this provider for a few appointments to  address eating habits before deciding to initiate services with a new provider.  Crystal Jackson's recent eating habits were explored. She acknowledged snacking frequently; therefore, she was engaged in problem solving to help reduce accessibility to snacks. Psychoeducation regarding SMART goals was also provided and Crystal Jackson was engaged in goal setting. The following goal was established: Crystal Jackson will eat dinner congruent to her meal plan at least 3 out of 7 days a week between now and the next appointment. Crystal Jackson was receptive to today's appointment as evidenced by openness to sharing, responsiveness to feedback, and willingness to work toward the established SMART goal.  Mental Status Examination:  Appearance: well groomed and appropriate hygiene  Behavior: appropriate to circumstances Mood: euthymic Affect: mood congruent Speech: normal in rate, volume, and tone Eye Contact: appropriate Psychomotor Activity: appropriate Gait: unable to assess Thought Process: linear, logical, and goal directed  Thought Content/Perception: denies suicidal and homicidal ideation, plan, and intent and no hallucinations, delusions, bizarre thinking or behavior reported or observed Orientation: time, person, place and purpose of appointment Memory/Concentration: memory, attention, language, and fund of knowledge intact  Insight/Judgment: good  Structured Assessments Results: The Patient Health Questionnaire-9 (PHQ-9) is a self-report measure that assesses symptoms and severity of depression over the course of the last two weeks. Crystal Jackson obtained a score of 10 suggesting moderate depression. Naja finds the endorsed symptoms to be somewhat difficult. [0= Not at all; 1= Several days; 2= More than half the days; 3= Nearly every day] Little interest or pleasure in doing things 0  Feeling down, depressed, or hopeless 1  Trouble falling or staying asleep, or sleeping too much 2  Feeling tired or having little energy 2    Poor appetite or overeating 2  Feeling bad about yourself --- or that you are a failure or have let yourself or your  family down 2  Trouble concentrating on things, such as reading the newspaper or watching television 1  Moving or speaking so slowly that other people could have noticed? Or the opposite --- being so fidgety or restless that you have been moving around a lot more than usual 0  Thoughts that you would be better off dead or hurting yourself in some way 0  PHQ-9 Score 10    The Generalized Anxiety Disorder-7 (GAD-7) is a brief self-report measure that assesses symptoms of anxiety over the course of the last two weeks. Crystal Jackson obtained a score of 5 suggesting mild anxiety. Crystal Jackson finds the endorsed symptoms to be somewhat difficult. [0= Not at all; 1= Several days; 2= Over half the days; 3= Nearly every day] Feeling nervous, anxious, on edge 1  Not being able to stop or control worrying 0  Worrying too much about different things 2  Trouble relaxing 0  Being so restless that it's hard to sit still 0  Becoming easily annoyed or irritable 2  Feeling afraid as if something awful might happen 0  GAD-7 Score 5   Interventions:  Conducted a brief chart review Verbally administered PHQ-9 and GAD-7 for symptom monitoring Provided empathic reflections and validation Employed supportive psychotherapy interventions to facilitate reduced distress and to improve coping skills with identified stressors Employed motivational interviewing skills to assess patient's willingness/desire to adhere to recommended medical treatments and assignments Engaged patient in goal setting Engaged patient in problem solving Recommended/discussed option for longer-term therapeutic services Psychoeducation provided regarding SMART goals  DSM-5 Diagnosis(es): 296.31 (F33.0) Major Depressive Disorder, Recurrent Episode, Mild, With Anxious Distress  Treatment Goal & Progress: During the initial appointment  with this provider, the following treatment goal was established: decrease emotional eating. Based on recent events, the treatment goal was switched to increasing coping skills.  Plan: The next appointment will be scheduled in approximately two weeks, which will be via MyChart Video Visit. The next session will focus on working towards the established treatment goal.

## 2019-05-06 ENCOUNTER — Ambulatory Visit (INDEPENDENT_AMBULATORY_CARE_PROVIDER_SITE_OTHER): Payer: Medicare Other | Admitting: Physician Assistant

## 2019-05-06 ENCOUNTER — Other Ambulatory Visit: Payer: Self-pay

## 2019-05-06 ENCOUNTER — Encounter (INDEPENDENT_AMBULATORY_CARE_PROVIDER_SITE_OTHER): Payer: Self-pay | Admitting: Physician Assistant

## 2019-05-06 VITALS — BP 118/74 | HR 70 | Temp 98.1°F | Ht 64.0 in | Wt 221.0 lb

## 2019-05-06 DIAGNOSIS — E559 Vitamin D deficiency, unspecified: Secondary | ICD-10-CM

## 2019-05-06 DIAGNOSIS — Z6838 Body mass index (BMI) 38.0-38.9, adult: Secondary | ICD-10-CM | POA: Diagnosis not present

## 2019-05-06 NOTE — Progress Notes (Signed)
Chief Complaint:   OBESITY Crystal Jackson is here to discuss her progress with her obesity treatment plan along with follow-up of her obesity related diagnoses. Crystal Jackson is on the Category 2 Plan/Pescatarian plan and states she is following her eating plan approximately 85% of the time. Crystal Jackson states she is walking 10-20 minutes 2-3 times per week.  Today's visit was #: 31 Starting weight: 242 lbs Starting date: 07/09/2018 Today's weight: 221 lbs Today's date: 05/06/2019 Total lbs lost to date: 21 Total lbs lost since last in-office visit: 1  Interim History: Crystal Jackson states that she is back to weighing her protein and is following the plan closely. She is not drinking enough water.  Subjective:   Vitamin D deficiency. Crystal Jackson is on Vitamin D. No nausea, vomiting, or muscle weakness. Last Vitamin D 90.2 on 11/10/2018.  Assessment/Plan:   Vitamin D deficiency. Low Vitamin D level contributes to fatigue and are associated with obesity, breast, and colon cancer. She agrees to continue to take Vitamin D and will follow-up for routine testing of Vitamin D, at least 2-3 times per year to avoid over-replacement.  Class 2 severe obesity with serious comorbidity and body mass index (BMI) of 38.0 to 38.9 in adult, unspecified obesity type (Crystal Jackson).  Crystal Jackson is currently in the action stage of change. As such, her goal is to continue with weight loss efforts. She has agreed to the Category 2 Plan.   Exercise goals: Older adults should follow the adult guidelines. When older adults cannot meet the adult guidelines, they should be as physically active as their abilities and conditions will allow.   Behavioral modification strategies: increasing water intake and meal planning and cooking strategies.  Crystal Jackson has agreed to follow-up with our clinic in 3 weeks. She was informed of the importance of frequent follow-up visits to maximize her success with intensive lifestyle modifications for her multiple  health conditions.   Objective:   Blood pressure 118/74, pulse 70, temperature 98.1 F (36.7 C), temperature source Oral, height 5\' 4"  (1.626 m), weight 221 lb (100.2 kg), SpO2 97 %. Body mass index is 37.93 kg/m.  General: Cooperative, alert, well developed, in no acute distress. HEENT: Conjunctivae and lids unremarkable. Cardiovascular: Regular rhythm.  Lungs: Normal work of breathing. Neurologic: No focal deficits.   Lab Results  Component Value Date   CREATININE 0.91 11/10/2018   BUN 21 11/10/2018   NA 138 11/10/2018   K 3.8 11/10/2018   CL 98 11/10/2018   CO2 27 11/10/2018   Lab Results  Component Value Date   ALT 22 11/10/2018   AST 24 11/10/2018   ALKPHOS 46 11/10/2018   BILITOT 0.3 11/10/2018   Lab Results  Component Value Date   HGBA1C 5.4 11/10/2018   HGBA1C 5.8 (H) 07/09/2018   Lab Results  Component Value Date   INSULIN 33.0 (H) 11/10/2018   INSULIN 25.0 (H) 07/09/2018   Lab Results  Component Value Date   TSH 2.210 07/09/2018   Lab Results  Component Value Date   CHOL 158 07/09/2018   HDL 54 07/09/2018   LDLCALC 86 07/09/2018   TRIG 92 07/09/2018   Lab Results  Component Value Date   WBC 6.0 07/09/2018   HGB 13.6 07/09/2018   HCT 39.7 07/09/2018   MCV 89 07/09/2018   PLT 241 07/09/2018   No results found for: IRON, TIBC, FERRITIN  Obesity Behavioral Intervention Documentation for Insurance:   Approximately 15 minutes were spent on the discussion below.  ASK:  We discussed the diagnosis of obesity with Crystal Jackson today and Crystal Jackson agreed to give [name] permission to discuss obesity behavioral modification therapy today.  ASSESS: Crystal Jackson has the diagnosis of obesity and her BMI today is 38.0. Crystal Jackson is in the action stage of change.   ADVISE: Crystal Jackson was educated on the multiple health risks of obesity as well as the benefit of weight loss to improve her health. She was advised of the need for long term treatment and the importance of  lifestyle modifications to improve her current health and to decrease her risk of future health problems.  AGREE: Multiple dietary modification options and treatment options were discussed and Crystal Jackson agreed to follow the recommendations documented in the above note.  ARRANGE: Crystal Jackson was educated on the importance of frequent visits to treat obesity as outlined per CMS and USPSTF guidelines and agreed to schedule her next follow up appointment today.  Attestation Statements:   Reviewed by clinician on day of visit: allergies, medications, problem list, medical history, surgical history, family history, social history, and previous encounter notes.  Crystal Jackson, am acting as transcriptionist for Crystal Cliche, Crystal Jackson   I have reviewed the above documentation for accuracy and completeness, and I agree with the above. Crystal Cliche, Crystal Jackson

## 2019-05-07 ENCOUNTER — Encounter (INDEPENDENT_AMBULATORY_CARE_PROVIDER_SITE_OTHER): Payer: Self-pay | Admitting: Physician Assistant

## 2019-05-07 NOTE — Telephone Encounter (Signed)
Please advise 

## 2019-05-11 ENCOUNTER — Telehealth (INDEPENDENT_AMBULATORY_CARE_PROVIDER_SITE_OTHER): Payer: Medicare Other | Admitting: Psychology

## 2019-05-12 ENCOUNTER — Telehealth (INDEPENDENT_AMBULATORY_CARE_PROVIDER_SITE_OTHER): Payer: Medicare Other | Admitting: Psychology

## 2019-05-12 ENCOUNTER — Other Ambulatory Visit: Payer: Self-pay

## 2019-05-12 DIAGNOSIS — F330 Major depressive disorder, recurrent, mild: Secondary | ICD-10-CM | POA: Diagnosis not present

## 2019-05-13 NOTE — Progress Notes (Signed)
  Office: [phone]  /  Fax: [phone]    Date: May 27, 2019   Appointment Start Time: 3:59pm Duration: 26 minutes Provider: Glennie Isle, Psy.D. Type of Session: Individual Therapy  Location of Patient: Home Location of Provider: Provider's Home Type of Contact: Telepsychological Visit via MyChart Video Visit  Session Content: Crystal Jackson is a 74 y.o. female presenting via MyChart Video Visit for a follow-up appointment to address the previously established treatment goal of increasing coping skills. Today's appointment was a telepsychological visit due to COVID-19. Crystal Jackson provided verbal consent for today's telepsychological appointment and she is aware she is responsible for securing confidentiality on her end of the session. Prior to proceeding with today's appointment, Crystal Jackson's physical location at the time of this appointment was obtained as well a phone number she could be reached at in the event of technical difficulties. Crystal Jackson and this provider participated in today's telepsychological service. Of note, today's appointment was switched to a regular telephone call with Crystal Jackson's verbal consent at 4:04pm due to an unstable connection.  This provider conducted a brief check-in. Crystal Jackson dicussed ongoing stressors. Associated thoughts and feelings were briefly processed. This provider also reviewed pleasurable activities previously discussed to assist with self-care. Crystal Jackson was receptive as evidenced by her reflecting on what she enjoys to do (e.g., read, watch a [employer] movie, visiting with neighbors and grandchildren, calling friends) and scheduling "me time" on a daily basis. Regarding eating, Crystal Jackson stated eating has improved, adding she lost weight. The previously established SMART goal for dinner was reviewed. Crystal Jackson indicated she met her goal and plans to plan ahead, as that was reportedly helped her be successful. Positive reinforcement was provided. Crystal Jackson was receptive to  today's appointment as evidenced by openness to sharing, responsiveness to feedback, and willingness to continue engaging in pleasurable activities.  Mental Status Examination:  Appearance: well groomed and appropriate hygiene  Behavior: appropriate to circumstances Mood: euthymic Affect: mood congruent Speech: normal in rate, volume, and tone Eye Contact: appropriate Psychomotor Activity: appropriate Gait: unable to assess Thought Process: linear, logical, and goal directed  Thought Content/Perception: no hallucinations, delusions, bizarre thinking or behavior reported or observed and no evidence of suicidal and homicidal ideation, plan, and intent Orientation: time, person, place and purpose of appointment Memory/Concentration: memory, attention, language, and fund of knowledge intact  Insight/Judgment: good  Interventions:  Conducted a brief chart review Provided empathic reflections and validation Reviewed content from the previous session Provided positive reinforcement Employed supportive psychotherapy interventions to facilitate reduced distress and to improve coping skills with identified stressors Employed motivational interviewing skills to assess patient's willingness/desire to adhere to recommended medical treatments and assignments Reviewed pleasurable activities for self-care  DSM-5 Diagnosis(es): 296.31 (F33.0) Major Depressive Disorder, Recurrent Episode, Mild, With Anxious Distress  Treatment Goal & Progress: During the initial appointment with this provider, the following treatment goal was established: increase coping skills. Crystal Jackson has demonstrated progress in her goal as evidenced by increased awareness of hunger patterns and increased awareness of triggers for emotional eating. Crystal Jackson also demonstrates willingness to engage in pleasurable activities.  Plan: Based on recent progress, frequency of appointments will be reduced. As such, the next appointment will be  scheduled in three weeks, which will be via MyChart Video Visit. The next session will focus on working towards the established treatment goal.

## 2019-05-27 ENCOUNTER — Other Ambulatory Visit: Payer: Self-pay

## 2019-05-27 ENCOUNTER — Ambulatory Visit (INDEPENDENT_AMBULATORY_CARE_PROVIDER_SITE_OTHER): Payer: Medicare Other | Admitting: Bariatrics

## 2019-05-27 ENCOUNTER — Telehealth (INDEPENDENT_AMBULATORY_CARE_PROVIDER_SITE_OTHER): Payer: Medicare Other | Admitting: Psychology

## 2019-05-27 ENCOUNTER — Encounter (INDEPENDENT_AMBULATORY_CARE_PROVIDER_SITE_OTHER): Payer: Self-pay | Admitting: Bariatrics

## 2019-05-27 ENCOUNTER — Other Ambulatory Visit (INDEPENDENT_AMBULATORY_CARE_PROVIDER_SITE_OTHER): Payer: Self-pay | Admitting: Bariatrics

## 2019-05-27 VITALS — BP 107/65 | HR 79 | Temp 97.9°F | Ht 64.0 in | Wt 217.0 lb

## 2019-05-27 DIAGNOSIS — F3289 Other specified depressive episodes: Secondary | ICD-10-CM | POA: Diagnosis not present

## 2019-05-27 DIAGNOSIS — I10 Essential (primary) hypertension: Secondary | ICD-10-CM

## 2019-05-27 DIAGNOSIS — E8881 Metabolic syndrome: Secondary | ICD-10-CM | POA: Diagnosis not present

## 2019-05-27 DIAGNOSIS — F330 Major depressive disorder, recurrent, mild: Secondary | ICD-10-CM | POA: Diagnosis not present

## 2019-05-27 DIAGNOSIS — K5909 Other constipation: Secondary | ICD-10-CM | POA: Diagnosis not present

## 2019-05-27 DIAGNOSIS — Z6837 Body mass index (BMI) 37.0-37.9, adult: Secondary | ICD-10-CM

## 2019-05-27 MED ORDER — CHLORTHALIDONE 25 MG PO TABS: 12.5000 mg | ORAL_TABLET | Freq: Every day | ORAL | 0 refills | Status: DC

## 2019-05-27 NOTE — Progress Notes (Signed)
Chief Complaint:   OBESITY Crystal Jackson is here to discuss her progress with her obesity treatment plan along with follow-up of her obesity related diagnoses. Crystal Jackson is on the Category 2 Plan and the Pescatarian Plan and states she is following her eating plan approximately 85-90% of the time. Crystal Jackson states she has increased her activity.   Today's visit was #: 20 Starting weight: 242 lbs Starting date: 07/09/2018 Today's weight: 217 lbs Today's date: 05/27/2019 Total lbs lost to date: 25 Total lbs lost since last in-office visit: 4  Interim History: Crystal Jackson is down 4 lbs.  Subjective:   Insulin resistance. Crystal Jackson has a diagnosis of insulin resistance based on her elevated fasting insulin level >5. She continues to work on diet and exercise to decrease her risk of diabetes.  Lab Results  Component Value Date   INSULIN 33.0 (H) 11/10/2018   INSULIN 25.0 (H) 07/09/2018   Lab Results  Component Value Date   HGBA1C 5.4 11/10/2018   Essential hypertension. Blood pressure is controlled.  BP Readings from Last 3 Encounters:  05/27/19 107/65  05/06/19 118/74  04/28/19 125/76   Lab Results  Component Value Date   CREATININE 0.91 11/10/2018   CREATININE 1.07 (H) 08/25/2018   CREATININE 0.98 07/09/2018   Other depression, with emotional eating. Crystal Jackson is struggling with emotional eating and using food for comfort to the extent that it is negatively impacting her health. She has been working on behavior modification techniques to help reduce her emotional eating and has been somewhat successful. She shows no sign of suicidal or homicidal ideations. Crystal Jackson is seeing Dr. Dewaine Conger.  Other constipation. Crystal Jackson is taking fiber (Benefiber in a.m.) and 2 stool softeners for her constipation.  Assessment/Plan:   Insulin resistance. Nikayla will continue to work on weight loss, exercise, increasing healthy fats and protein, and decreasing simple carbohydrates to help decrease the  risk of diabetes. Crystal Jackson agreed to follow-up with [name] as directed to closely monitor her progress. Comprehensive metabolic panel, Hemoglobin A1c, Insulin, random, Lipid Panel With LDL/HDL Ratio labs drawn today.  Essential hypertension. Crystal Jackson is working on healthy weight loss and exercise to improve blood pressure control. We will watch for signs of hypotension as she continues her lifestyle modifications. Prescription was given for chlorthalidone (HYGROTON) 25 MG tablet take 0.5 tablet (12.5 mg total) daily #30 with 0 refills. Comprehensive metabolic panel, Hemoglobin A1c, Insulin, random, Lipid Panel With LDL/HDL Ratio labs ordered today.  Other depression, with emotional eating. Behavior modification techniques were discussed today to help Crystal Jackson deal with her emotional/non-hunger eating behaviors.  Orders and follow up as documented in patient record. Crystal Jackson will continue seeing Dr. Dewaine Conger as scheduled.  Other constipation. Crystal Jackson was informed that a decrease in bowel movement frequency is normal while losing weight, but stools should not be hard or painful. Orders and follow up as documented in patient record. She will try glycerin suppositories and Miralax daily with water.  Counseling Getting to Good Bowel Health: Your goal is to have one soft bowel movement each day. Drink at least 8 glasses of water each day. Eat plenty of fiber (goal is over 25 grams each day). It is best to get most of your fiber from dietary sources which includes leafy green vegetables, fresh fruit, and whole grains. You may need to add fiber with the help of OTC fiber supplements. These include Metamucil, Citrucel, and Flaxseed. If you are still having trouble, try adding Miralax or Magnesium Citrate. If all of these  changes do not work, Cabin crew.  Class 2 severe obesity with serious comorbidity and body mass index (BMI) of 37.0 to 37.9 in adult, unspecified obesity type ([place]).  Crystal Jackson is currently in  the action stage of change. As such, her goal is to continue with weight loss efforts. She has agreed to the Category 2 Plan and the [place].  She will work on meal planning and intentional eating.    Exercise goals: Older adults should follow the adult guidelines. When older adults cannot meet the adult guidelines, they should be as physically active as their abilities and conditions will allow.   Behavioral modification strategies: increasing lean protein intake, decreasing simple carbohydrates, increasing vegetables, increasing water intake, decreasing eating out, no skipping meals, meal planning and cooking strategies and keeping healthy foods in the home.  Crystal Jackson has agreed to follow-up with our clinic in 2 weeks. She was informed of the importance of frequent follow-up visits to maximize her success with intensive lifestyle modifications for her multiple health conditions.   Crystal Jackson was informed we would discuss her lab results at her next visit unless there is a critical issue that needs to be addressed sooner. Crystal Jackson agreed to keep her next visit at the agreed upon time to discuss these results.  Objective:   Blood pressure 107/65, pulse 79, temperature 97.9 F (36.6 C), temperature source Oral, height 5\' 4"  (1.626 m), weight 217 lb (98.4 kg), SpO2 94 %. Body mass index is 37.25 kg/m.  General: Cooperative, alert, well developed, in no acute distress. HEENT: Conjunctivae and lids unremarkable. Cardiovascular: Regular rhythm.  Lungs: Normal work of breathing. Neurologic: No focal deficits.   Lab Results  Component Value Date   CREATININE 0.91 11/10/2018   BUN 21 11/10/2018   NA 138 11/10/2018   K 3.8 11/10/2018   CL 98 11/10/2018   CO2 27 11/10/2018   Lab Results  Component Value Date   ALT 22 11/10/2018   AST 24 11/10/2018   ALKPHOS 46 11/10/2018   BILITOT 0.3 11/10/2018   Lab Results  Component Value Date   HGBA1C 5.4 11/10/2018   HGBA1C 5.8 (H)  07/09/2018   Lab Results  Component Value Date   INSULIN 33.0 (H) 11/10/2018   INSULIN 25.0 (H) 07/09/2018   Lab Results  Component Value Date   TSH 2.210 07/09/2018   Lab Results  Component Value Date   CHOL 158 07/09/2018   HDL 54 07/09/2018   LDLCALC 86 07/09/2018   TRIG 92 07/09/2018   Lab Results  Component Value Date   WBC 6.0 07/09/2018   HGB 13.6 07/09/2018   HCT 39.7 07/09/2018   MCV 89 07/09/2018   PLT 241 07/09/2018   No results found for: IRON, TIBC, FERRITIN  Obesity Behavioral Intervention Documentation for Insurance:   Approximately 15 minutes were spent on the discussion below.  ASK: We discussed the diagnosis of obesity with Crystal Jackson today and Crystal Jackson agreed to give [name] permission to discuss obesity behavioral modification therapy today.  ASSESS: Shawny has the diagnosis of obesity and her BMI today is 37.2. Crystal Jackson is in the action stage of change.   ADVISE: Crystal Jackson was educated on the multiple health risks of obesity as well as the benefit of weight loss to improve her health. She was advised of the need for long term treatment and the importance of lifestyle modifications to improve her current health and to decrease her risk of future health problems.  AGREE: Multiple dietary modification options and  treatment options were discussed and Crystal Jackson agreed to follow the recommendations documented in the above note.  ARRANGE: Crystal Jackson was educated on the importance of frequent visits to treat obesity as outlined per CMS and USPSTF guidelines and agreed to schedule her next follow up appointment today.  Attestation Statements:   Reviewed by clinician on day of visit: allergies, medications, problem list, medical history, surgical history, family history, social history, and previous encounter notes.  Fernanda Drum, am acting as Energy manager for [employer], DO   I have reviewed the above documentation for accuracy and completeness, and I agree  with the above. Corinna Capra, DO

## 2019-05-28 LAB — COMPREHENSIVE METABOLIC PANEL
ALT: 23 IU/L (ref 0–32)
AST: 26 IU/L (ref 0–40)
Albumin/Globulin Ratio: 1.6 (ref 1.2–2.2)
Albumin: 4.1 g/dL (ref 3.7–4.7)
Alkaline Phosphatase: 51 IU/L (ref 48–121)
BUN/Creatinine Ratio: 25 (ref 12–28)
BUN: 25 mg/dL (ref 8–27)
Bilirubin Total: 0.3 mg/dL (ref 0.0–1.2)
CO2: 27 mmol/L (ref 20–29)
Calcium: 9.6 mg/dL (ref 8.7–10.3)
Chloride: 96 mmol/L (ref 96–106)
Creatinine, Ser: 1 mg/dL (ref 0.57–1.00)
GFR calc Af Amer: 65 mL/min/{1.73_m2} (ref >59)
GFR calc non Af Amer: 56 mL/min/{1.73_m2} — ABNORMAL LOW (ref >59)
Globulin, Total: 2.6 g/dL (ref 1.5–4.5)
Glucose: 103 mg/dL — ABNORMAL HIGH (ref 65–99)
Potassium: 3.9 mmol/L (ref 3.5–5.2)
Sodium: 138 mmol/L (ref 134–144)
Total Protein: 6.7 g/dL (ref 6.0–8.5)

## 2019-05-28 LAB — LIPID PANEL WITH LDL/HDL RATIO
Cholesterol, Total: 141 mg/dL (ref 100–199)
HDL: 55 mg/dL (ref >39)
LDL Chol Calc (NIH): 69 mg/dL (ref 0–99)
LDL/HDL Ratio: 1.3 ratio (ref 0.0–3.2)
Triglycerides: 88 mg/dL (ref 0–149)
VLDL Cholesterol Cal: 17 mg/dL (ref 5–40)

## 2019-05-28 LAB — HEMOGLOBIN A1C
Est. average glucose Bld gHb Est-mCnc: 105 mg/dL
Hgb A1c MFr Bld: 5.3 % (ref 4.8–5.6)

## 2019-05-28 LAB — INSULIN, RANDOM: INSULIN: 16.2 u[IU]/mL (ref 2.6–24.9)

## 2019-06-03 NOTE — Progress Notes (Signed)
  Office: [phone]  /  Fax: [phone]    Date: June 17, 2019   Appointment Start Time: 4:38pm Duration: 26 minutes Provider: Lawerance Cruel, Psy.D. Type of Session: Individual Therapy  Location of Patient: Home Location of Provider: Provider's Home Type of Contact: Telepsychological Visit via Telephone Call  Session Content: This provider called Crystal Jackson at 4:33pm as she did not present for the telepsychological appointment. She noted she lost track of time, but noted she would join. This provider called again at 4:37pm as Crystal Jackson did not join and she explained her computer was not working. She provided verbal consent to proceed via a regular telephone call. As such, today's appointment was initiated 8 minutes late.   Crystal Jackson is a 74 y.o. female presenting via telephone call for a follow-up appointment to address the previously established treatment goal of increasing coping skills. Today's appointment was a telepsychological visit due to COVID-19. Julea provided verbal consent for today's telepsychological appointment and she is aware she is responsible for securing confidentiality on her end of the session. Prior to proceeding with today's appointment, Crystal Jackson's physical location at the time of this appointment was obtained as well a phone number she could be reached at in the event of technical difficulties. Crystal Jackson and this provider participated in today's telepsychological service.   This provider conducted a brief check-in. Crystal Jackson reported she is "more calm" than she has been, but expressed concern about recent weight gain. This provider explored what Crystal Jackson feels has contributed to weight gain. She acknowledged she has deviated from her plan and skipped meals due to doctors' appointments and expressed difficulty consuming enough water. As such, strategies (e.g., packing snacks or meals when going for doctor's appointments, setting alarms to drink water) to help address the  aforementioned were discussed. She noted, "I think they will help." This provider also discussed the consequences of not eating regularly/skipping meals. Crystal Jackson was receptive to today's appointment as evidenced by openness to sharing, responsiveness to feedback, and willingness to implement discussed strategies .  Mental Status Examination:  Appearance: well groomed and appropriate hygiene  Behavior: appropriate to circumstances Mood: euthymic Affect: mood congruent Speech: normal in rate, volume, and tone Eye Contact: appropriate Psychomotor Activity: appropriate Gait: unable to assess Thought Process: linear, logical, and goal directed  Thought Content/Perception: no hallucinations, delusions, bizarre thinking or behavior reported or observed and no evidence of suicidal and homicidal ideation, plan, and intent Orientation: time, person, place, and purpose of appointment Memory/Concentration: memory, attention, language, and fund of knowledge intact  Insight/Judgment: good   Interventions:  Conducted a brief chart review Provided empathic reflections and validation Employed supportive psychotherapy interventions to facilitate reduced distress and to improve coping skills with identified stressors Employed motivational interviewing skills to assess patient's willingness/desire to adhere to recommended medical treatments and assignments Engaged patient in problem solving  DSM-5 Diagnosis(es): 296.31 (F33.0) Major Depressive Disorder, Recurrent Episode, Mild, With Anxious Distress  Treatment Goal & Progress: During the initial appointment with this provider, the following treatment goal was established: increase coping skills. Crystal Jackson has demonstrated progress in her goal as evidenced by increased awareness of hunger patterns and increased awareness of triggers for emotional eating. Crystal Jackson also continues to demonstrate willingness to engage in learned skill(s).  Plan: The next appointment  will be scheduled in approximately two weeks, which will be via MyChart Video Visit. The next session will focus on working towards the established treatment goal.

## 2019-06-10 ENCOUNTER — Encounter (INDEPENDENT_AMBULATORY_CARE_PROVIDER_SITE_OTHER): Payer: Self-pay | Admitting: Physician Assistant

## 2019-06-11 NOTE — Telephone Encounter (Signed)
Please advise 

## 2019-06-17 ENCOUNTER — Other Ambulatory Visit: Payer: Self-pay

## 2019-06-17 ENCOUNTER — Encounter (INDEPENDENT_AMBULATORY_CARE_PROVIDER_SITE_OTHER): Payer: Self-pay

## 2019-06-17 ENCOUNTER — Telehealth (INDEPENDENT_AMBULATORY_CARE_PROVIDER_SITE_OTHER): Payer: Medicare Other | Admitting: Psychology

## 2019-06-17 DIAGNOSIS — F330 Major depressive disorder, recurrent, mild: Secondary | ICD-10-CM

## 2019-06-18 ENCOUNTER — Telehealth: Payer: Self-pay | Admitting: Orthopaedic Surgery

## 2019-06-18 NOTE — Telephone Encounter (Signed)
Pt called asking that another referral be put in for PT at a facility on [address] that the pt believes is called [facility]. Pt also asked that some mobic be called in.   [phone]

## 2019-06-18 NOTE — Telephone Encounter (Signed)
LMOM for patient letting her know we did send her PT referral to [facility] Also asked her to let me know if its Mobic she wants or Diclofenac. We've given her the Diclofenac in the past

## 2019-06-18 NOTE — Progress Notes (Signed)
  Office: [phone]  /  Fax: [phone]    Date: July 02, 2019   Appointment Start Time: 10:31am Duration: 22 minutes Provider: Lawerance Cruel, Psy.D. Type of Session: Individual Therapy  Location of Patient: Home Location of Provider: Provider's Home Type of Contact: Telepsychological Visit via MyChart Video Visit  Session Content: Crystal Jackson is a 74 y.o. female presenting via MyChart Video Visit for a follow-up appointment to address the previously established treatment goal of increasing coping skills. Today's appointment was a telepsychological visit due to COVID-19. Crystal Jackson provided verbal consent for today's telepsychological appointment and she is aware she is responsible for securing confidentiality on her end of the session. Prior to proceeding with today's appointment, Crystal Jackson's physical location at the time of this appointment was obtained as well a phone number she could be reached at in the event of technical difficulties. Crystal Jackson and this provider participated in today's telepsychological service.   This provider conducted a brief check-in. Crystal Jackson stated she is following the low carb plan, but discussed "struggles." This was further explored and she was receptive to discussing recipe options with Crystal Cliche, PA-C. Session then focused on reviewing mindfulness. She added, "It would be good to get back on track." She was led through a mindfulness exercise and her experience was processed. This provider discussed the utilization of YouTube for mindfulness exercises (e.g., exercises by Rhae Hammock). Furthermore, termination planning was discussed. Crystal Jackson was receptive to a follow-up appointment in 3-4 weeks and an additional follow-up/termination appointment in 3-4 weeks after that. Crystal Jackson was receptive to today's appointment as evidenced by openness to sharing, responsiveness to feedback, and willingness to continue engaging in mindfulness exercises.  Mental Status Examination:    Appearance: well groomed and appropriate hygiene  Behavior: appropriate to circumstances Mood: euthymic Affect: mood congruent Speech: normal in rate, volume, and tone Eye Contact: appropriate Psychomotor Activity: appropriate Gait: unable to assess Thought Process: linear, logical, and goal directed  Thought Content/Perception: no hallucinations, delusions, bizarre thinking or behavior reported or observed and no evidence of suicidal and homicidal ideation, plan, and intent Orientation: time, person, place, and purpose of appointment Memory/Concentration: memory, attention, language, and fund of knowledge intact  Insight/Judgment: good  Interventions:  Conducted a brief chart review Provided empathic reflections and validation Employed supportive psychotherapy interventions to facilitate reduced distress and to improve coping skills with identified stressors Engaged patient in mindfulness exercise(s) Employed acceptance and commitment interventions to emphasize mindfulness and acceptance without struggle Discussed termination planning  DSM-5 Diagnosis(es): 296.31 (F33.0) Major Depressive Disorder, Recurrent Episode, Mild, With Anxious Distress  Treatment Goal & Progress: During the initial appointment with this provider, the following treatment goal was established: increase coping skills. Crystal Jackson has demonstrated progress in her goal as evidenced by increased awareness of hunger patterns, increased awareness of triggers for emotional eating and reduction in emotional eating. Crystal Jackson also continues to demonstrate willingness to engage in learned skill(s).  Plan: The next appointment will be scheduled in three weeks, which will be via MyChart Video Visit. The next session will focus on working towards the established treatment goal.

## 2019-06-22 ENCOUNTER — Ambulatory Visit (INDEPENDENT_AMBULATORY_CARE_PROVIDER_SITE_OTHER): Payer: Medicare Other | Admitting: Physician Assistant

## 2019-06-22 ENCOUNTER — Encounter (INDEPENDENT_AMBULATORY_CARE_PROVIDER_SITE_OTHER): Payer: Self-pay | Admitting: Physician Assistant

## 2019-06-22 ENCOUNTER — Other Ambulatory Visit: Payer: Self-pay

## 2019-06-22 VITALS — BP 117/63 | HR 66 | Temp 97.6°F | Ht 64.0 in | Wt 220.0 lb

## 2019-06-22 DIAGNOSIS — I10 Essential (primary) hypertension: Secondary | ICD-10-CM

## 2019-06-22 DIAGNOSIS — Z6837 Body mass index (BMI) 37.0-37.9, adult: Secondary | ICD-10-CM

## 2019-06-22 NOTE — Progress Notes (Signed)
Chief Complaint:   OBESITY Crystal Jackson is here to discuss her progress with her obesity treatment plan along with follow-up of her obesity related diagnoses. Crystal Jackson is on the Category 2 Plan and the [place] and states she is following her eating plan approximately 75-80% of the time. Anagha states she is exercising 0 minutes 0 times per week.  Today's visit was #: 21 Starting weight: 242 lbs Starting date: 07/09/2018 Today's weight: 220 lbs Today's date: 06/22/2019 Total lbs lost to date: 22 Total lbs lost since last in-office visit: 0  Interim History: Crystal Jackson reports that she has lost motivation to follow the plan.  Subjective:   Essential hypertension. Blood pressure is controlled. No chest pain or headache. Crystal Jackson is on Cardura or  Lopressor.  BP Readings from Last 3 Encounters:  06/22/19 117/63  05/27/19 107/65  05/06/19 118/74   Lab Results  Component Value Date   CREATININE 1.00 05/27/2019   CREATININE 0.91 11/10/2018   CREATININE 1.07 (H) 08/25/2018   Assessment/Plan:   Essential hypertension. Crystal Jackson is working on healthy weight loss and exercise to improve blood pressure control. We will watch for signs of hypotension as she continues her lifestyle modifications. She will continue her medication as directed.  Class 2 severe obesity with serious comorbidity and body mass index (BMI) of 37.0 to 37.9 in adult, unspecified obesity type ([place]).  Crystal Jackson is currently in the action stage of change. As such, her goal is to continue with weight loss efforts. She has agreed to following a lower carbohydrate, vegetable and lean protein rich diet plan.   Exercise goals: Older adults should follow the adult guidelines. When older adults cannot meet the adult guidelines, they should be as physically active as their abilities and conditions will allow.   Behavioral modification strategies: meal planning and cooking strategies and keeping healthy foods in the  home.  Crystal Jackson has agreed to follow-up with our clinic in 2 weeks. She was informed of the importance of frequent follow-up visits to maximize her success with intensive lifestyle modifications for her multiple health conditions.   Objective:   Blood pressure 117/63, pulse 66, temperature 97.6 F (36.4 C), temperature source Oral, height 5\' 4"  (1.626 m), weight 220 lb (99.8 kg), SpO2 97 %. Body mass index is 37.76 kg/m.  General: Cooperative, alert, well developed, in no acute distress. HEENT: Conjunctivae and lids unremarkable. Cardiovascular: Regular rhythm.  Lungs: Normal work of breathing. Neurologic: No focal deficits.   Lab Results  Component Value Date   CREATININE 1.00 05/27/2019   BUN 25 05/27/2019   NA 138 05/27/2019   K 3.9 05/27/2019   CL 96 05/27/2019   CO2 27 05/27/2019   Lab Results  Component Value Date   ALT 23 05/27/2019   AST 26 05/27/2019   ALKPHOS 51 05/27/2019   BILITOT 0.3 05/27/2019   Lab Results  Component Value Date   HGBA1C 5.3 05/27/2019   HGBA1C 5.4 11/10/2018   HGBA1C 5.8 (H) 07/09/2018   Lab Results  Component Value Date   INSULIN 16.2 05/27/2019   INSULIN 33.0 (H) 11/10/2018   INSULIN 25.0 (H) 07/09/2018   Lab Results  Component Value Date   TSH 2.210 07/09/2018   Lab Results  Component Value Date   CHOL 141 05/27/2019   HDL 55 05/27/2019   LDLCALC 69 05/27/2019   TRIG 88 05/27/2019   Lab Results  Component Value Date   WBC 6.0 07/09/2018   HGB 13.6 07/09/2018  HCT 39.7 07/09/2018   MCV 89 07/09/2018   PLT 241 07/09/2018   No results found for: IRON, TIBC, FERRITIN  Obesity Behavioral Intervention Documentation for Insurance:   Approximately 15 minutes were spent on the discussion below.  ASK: We discussed the diagnosis of obesity with Bernece today and Maribell agreed to give [name] permission to discuss obesity behavioral modification therapy today.  ASSESS: Crystal Jackson has the diagnosis of obesity and her BMI today  is 37.9. Crystal Jackson is in the action stage of change.   ADVISE: Crystal Jackson was educated on the multiple health risks of obesity as well as the benefit of weight loss to improve her health. She was advised of the need for long term treatment and the importance of lifestyle modifications to improve her current health and to decrease her risk of future health problems.  AGREE: Multiple dietary modification options and treatment options were discussed and Crystal Jackson agreed to follow the recommendations documented in the above note.  ARRANGE: Crystal Jackson was educated on the importance of frequent visits to treat obesity as outlined per CMS and USPSTF guidelines and agreed to schedule her next follow up appointment today.  Attestation Statements:   Reviewed by clinician on day of visit: allergies, medications, problem list, medical history, surgical history, family history, social history, and previous encounter notes.  IMarianna Payment, am acting as transcriptionist for [name], PA-C   I have reviewed the above documentation for accuracy and completeness, and I agree with the above. [name], PA-C

## 2019-06-23 NOTE — Procedures (Signed)
Lumbar Facet Joint Intra-Articular Injection(s) with Fluoroscopic Guidance  [name]      Date of Birth: 10-09-1945 MRN: [id number] PCP: Melida Quitter, MD      Visit Date: 04/28/2019   Universal Protocol:    Date/Time: 04/28/2019  Consent Given By: the patient  Position: PRONE   Additional Comments: Vital signs were monitored before and after the procedure. Patient was prepped and draped in the usual sterile fashion. The correct patient, procedure, and site was verified.   Injection Procedure Details:  Procedure Site One Meds Administered:  Meds ordered this encounter  Medications  . methylPREDNISolone acetate (DEPO-MEDROL) injection 40 mg     Laterality: Bilateral  Location/Site:  L4-L5  Needle size: 22 guage  Needle type: Spinal  Needle Placement: Articular  Findings:  -Comments: Excellent flow of contrast producing a partial arthrogram.  Procedure Details: The fluoroscope beam is vertically oriented in AP, and the inferior recess is visualized beneath the lower pole of the inferior apophyseal process, which represents the target point for needle insertion. When direct visualization is difficult the target point is located at the medial projection of the vertebral pedicle. The region overlying each aforementioned target is locally anesthetized with a 1 to 2 ml. volume of 1% Lidocaine without Epinephrine.   The spinal needle was inserted into each of the above mentioned facet joints using biplanar fluoroscopic guidance. A 0.25 to 0.5 ml. volume of Isovue-250 was injected and a partial facet joint arthrogram was obtained. A single spot film was obtained of the resulting arthrogram.    One to 1.25 ml of the steroid/anesthetic solution was then injected into each of the facet joints noted above.   Additional Comments:  The patient tolerated the procedure well Dressing: 2 x 2 sterile gauze and Band-Aid    Post-procedure details: Patient was observed  during the procedure. Post-procedure instructions were reviewed.  Patient left the clinic in stable condition.

## 2019-06-23 NOTE — Progress Notes (Signed)
Crystal Jackson - 74 y.o. female MRN [id number]  Date of birth: 13-Sep-1945  Office Visit Note: Visit Date: 04/28/2019 PCP: Melida Quitter, MD Referred by: Albertina Senegal, MD  Subjective: Chief Complaint  Patient presents with  . Lower Back - Pain   HPI:  Crystal Jackson is a 74 y.o. female who comes in today for planned repeat Bilateral L4-L5 lumbar facet/medial branch block with fluoroscopic guidance.  The patient has failed conservative care including home exercise, medications, time and activity modification.  This injection will be diagnostic and hopefully therapeutic.  Please see requesting physician notes for further details and justification.    Exam shows concordant low back pain with facet joint loading and extension. Patient received more than 80% pain relief from prior injection.  We did have a discussion today about the possibility of radiofrequency ablation of the lumbar facet joints at L4-5.  We completed intra-articular injection because of the nature of her spine condition with some fluid-filled facet joints.  Also body habitus comes into play.  She did get several months of really good relief although not 100%.  I think the best approach is to complete therapeutic injection again intra-articularly today.  Depending on relief would look at diagnostic medial branch blocks.    Referring: Rexene Edison, P.A.-C  ROS Otherwise per HPI.  Assessment & Plan: Visit Diagnoses:  1. Spondylosis without myelopathy or radiculopathy, lumbar region     Plan: No additional findings.   Meds & Orders:  Meds ordered this encounter  Medications  . methylPREDNISolone acetate (DEPO-MEDROL) injection 40 mg    Orders Placed This Encounter  Procedures  . Facet Injection  . XR C-ARM NO REPORT    Follow-up: Return if symptoms worsen or fail to improve.   Procedures: No procedures performed  Lumbar Facet Joint Intra-Articular Injection(s) with Fluoroscopic Guidance  [name]      Date of Birth: 1945/03/10 MRN: [id number] PCP: Melida Quitter, MD      Visit Date: 04/28/2019   Universal Protocol:    Date/Time: 04/28/2019  Consent Given By: the patient  Position: PRONE   Additional Comments: Vital signs were monitored before and after the procedure. Patient was prepped and draped in the usual sterile fashion. The correct patient, procedure, and site was verified.   Injection Procedure Details:  Procedure Site One Meds Administered:  Meds ordered this encounter  Medications  . methylPREDNISolone acetate (DEPO-MEDROL) injection 40 mg     Laterality: Bilateral  Location/Site:  L4-L5  Needle size: 22 guage  Needle type: Spinal  Needle Placement: Articular  Findings:  -Comments: Excellent flow of contrast producing a partial arthrogram.  Procedure Details: The fluoroscope beam is vertically oriented in AP, and the inferior recess is visualized beneath the lower pole of the inferior apophyseal process, which represents the target point for needle insertion. When direct visualization is difficult the target point is located at the medial projection of the vertebral pedicle. The region overlying each aforementioned target is locally anesthetized with a 1 to 2 ml. volume of 1% Lidocaine without Epinephrine.   The spinal needle was inserted into each of the above mentioned facet joints using biplanar fluoroscopic guidance. A 0.25 to 0.5 ml. volume of Isovue-250 was injected and a partial facet joint arthrogram was obtained. A single spot film was obtained of the resulting arthrogram.    One to 1.25 ml of the steroid/anesthetic solution was then injected into each of the facet joints noted above.   Additional  Comments:  The patient tolerated the procedure well Dressing: 2 x 2 sterile gauze and Band-Aid    Post-procedure details: Patient was observed during the procedure. Post-procedure instructions were reviewed.  Patient left the clinic in  stable condition.     Clinical History: MRI LUMBAR SPINE WITHOUT CONTRAST  TECHNIQUE: Multiplanar, multisequence MR imaging of the lumbar spine was performed. No intravenous contrast was administered.  COMPARISON:  Radiographs from 10/01/2018  FINDINGS: Segmentation: The lowest lumbar type non-rib-bearing vertebra is labeled as L5.  Alignment:  2 mm degenerative anterolisthesis at L4-5.  Vertebrae: Cysts fluid signal intensity lesions favoring bilaterally Tarlov at the S2 level and on the left at the S1 level.  Degenerative perifacet edema bilaterally at L4-5 and L5-S1.  Conus medullaris and cauda equina: Conus extends to the L1 level. Conus and cauda equina appear normal.  Paraspinal and other soft tissues: Small common iliac lymph nodes are present.  Disc levels:  T12-L1: No impingement.  Mild disc bulge.  L1-2: Unremarkable.  L2-3: No impingement.  Mild disc bulge.  L3-4: Mild displacement of the right L3 nerve in the lateral extraforaminal space due to right lateral extraforaminal disc protrusion.  L4-5: Borderline left subarticular lateral recess stenosis due to disc bulge and facet arthropathy. Borderline central narrowing of the thecal sac. Bilateral facet joint effusions as on image 30/14.  L5-S1: No impingement. 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 Bilateral degenerative facet arthropathy.  IMPRESSION: 1. Lumbar spondylosis and degenerative disc disease resulting in mild impingement at L3-4. 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 3. Degenerative perifacet edema at L4-5 and L5-S1 with facet joint effusions bilaterally at L4-5.   Electronically Signed   By: Van Clines M.D.   On: 10/10/2018 14:25     Objective:  VS:  HT:    WT:   BMI:     BP:125/76  HR:72bpm  TEMP: ( )  RESP:  Physical Exam Constitutional:      General: She is not in acute distress.    Appearance: Normal appearance. She is not  ill-appearing.  HENT:     Head: Normocephalic and atraumatic.     Right Ear: External ear normal.     Left Ear: External ear normal.  Eyes:     Extraocular Movements: Extraocular movements intact.  Cardiovascular:     Rate and Rhythm: Normal rate.     Pulses: Normal pulses.  Musculoskeletal:     Right lower leg: No edema.     Left lower leg: No edema.     Comments: Patient has good distal strength with no pain over the greater trochanters.  No clonus or focal weakness.  Concordant low back pain with facet loading.  Skin:    Findings: No erythema, lesion or rash.  Neurological:     General: No focal deficit present.     Mental Status: She is alert and oriented to person, place, and time.     Sensory: No sensory deficit.     Motor: No weakness or abnormal muscle tone.     Coordination: Coordination normal.  Psychiatric:        Mood and Affect: Mood normal.        Behavior: Behavior normal.      Imaging: No results found.

## 2019-06-25 ENCOUNTER — Other Ambulatory Visit: Payer: Self-pay

## 2019-06-25 DIAGNOSIS — M1711 Unilateral primary osteoarthritis, right knee: Secondary | ICD-10-CM

## 2019-06-25 DIAGNOSIS — M4807 Spinal stenosis, lumbosacral region: Secondary | ICD-10-CM

## 2019-06-25 DIAGNOSIS — M1712 Unilateral primary osteoarthritis, left knee: Secondary | ICD-10-CM

## 2019-06-25 MED ORDER — MELOXICAM 7.5 MG PO TABS
7.5000 mg | ORAL_TABLET | Freq: Two times a day (BID) | ORAL | 1 refills | Status: DC | PRN
Start: 2019-06-25 — End: 2019-10-01

## 2019-07-02 ENCOUNTER — Telehealth (INDEPENDENT_AMBULATORY_CARE_PROVIDER_SITE_OTHER): Payer: Medicare Other | Admitting: Psychology

## 2019-07-02 ENCOUNTER — Other Ambulatory Visit: Payer: Self-pay

## 2019-07-02 ENCOUNTER — Encounter (INDEPENDENT_AMBULATORY_CARE_PROVIDER_SITE_OTHER): Payer: Self-pay

## 2019-07-02 DIAGNOSIS — F330 Major depressive disorder, recurrent, mild: Secondary | ICD-10-CM

## 2019-07-07 ENCOUNTER — Ambulatory Visit (INDEPENDENT_AMBULATORY_CARE_PROVIDER_SITE_OTHER): Payer: Medicare Other | Admitting: Physician Assistant

## 2019-07-09 ENCOUNTER — Ambulatory Visit (INDEPENDENT_AMBULATORY_CARE_PROVIDER_SITE_OTHER): Payer: Medicare Other | Admitting: Physician Assistant

## 2019-07-09 ENCOUNTER — Encounter (INDEPENDENT_AMBULATORY_CARE_PROVIDER_SITE_OTHER): Payer: Self-pay | Admitting: Physician Assistant

## 2019-07-09 ENCOUNTER — Other Ambulatory Visit: Payer: Self-pay

## 2019-07-09 DIAGNOSIS — Z6837 Body mass index (BMI) 37.0-37.9, adult: Secondary | ICD-10-CM | POA: Diagnosis not present

## 2019-07-09 DIAGNOSIS — E559 Vitamin D deficiency, unspecified: Secondary | ICD-10-CM

## 2019-07-09 DIAGNOSIS — G4733 Obstructive sleep apnea (adult) (pediatric): Secondary | ICD-10-CM

## 2019-07-09 NOTE — Progress Notes (Signed)
Chief Complaint:   OBESITY Crystal Jackson is here to discuss her progress with her obesity treatment plan along with follow-up of her obesity related diagnoses. Greenly is following a lower carbohydrate, vegetable and lean protein rich diet plan and states she is following her eating plan approximately 85% of the time. Ronie states she is walking 10 minutes 3 times per week.  Today's visit was #: 22 Starting weight: 242 lbs Starting date: 07/09/2018 Today's weight: 219 lbs Today's date: 07/09/2019 Total lbs lost to date: 23 Total lbs lost since last in-office visit: 1  Interim History: Crystal Jackson did not care for the low carb meal plan, as she missed bread and milk. She is ready to get back on a category plan.  Subjective:   Vitamin D deficiency. [name] is on Vitamin D supplementation. No nausea, vomiting, or muscle weakness.    Ref. Range 11/10/2018 10:00  Vitamin D, 25-Hydroxy Latest Ref Range: 30.0 - 100.0 ng/mL 90.2   OSA (obstructive sleep apnea). Lindsy uses CPAP regularly. She is followed by Pulmonology.  Assessment/Plan:   Vitamin D deficiency. Low Vitamin D level contributes to fatigue and are associated with obesity, breast, and colon cancer. She agrees to continue to take Vitamin D as directed and will follow-up for routine testing of Vitamin D, at least 2-3 times per year to avoid over-replacement.  OSA (obstructive sleep apnea). Intensive lifestyle modifications are the first line treatment for this issue. We discussed several lifestyle modifications today and she will continue to work on diet, exercise and weight loss efforts. We will continue to monitor. Orders and follow up as documented in patient record. Crystal Jackson will continue with CPAP as directed.  Counseling  Sleep apnea is a condition in which breathing pauses or becomes shallow during sleep. This happens over and over during the night. This disrupts your sleep and keeps your body from getting the rest that it  needs, which can cause tiredness and lack of energy (fatigue) during the day.  Sleep apnea treatment: If you were given a device to open your airway while you sleep, USE IT!  Sleep hygiene:   Limit or avoid alcohol, caffeinated beverages, and cigarettes, especially close to bedtime.   Do not eat a large meal or eat spicy foods right before bedtime. This can lead to digestive discomfort that can make it hard for you to sleep.  Keep a sleep diary to help you and your health care provider figure out what could be causing your insomnia.  . Make your bedroom a dark, comfortable place where it is easy to fall asleep. ? Put up shades or blackout curtains to block light from outside. ? Use a white noise machine to block noise. ? Keep the temperature cool. . Limit screen use before bedtime. This includes: ? Watching TV. ? Using your smartphone, tablet, or computer. . Stick to a routine that includes going to bed and waking up at the same times every day and night. This can help you fall asleep faster. Consider making a quiet activity, such as reading, part of your nighttime routine. . Try to avoid taking naps during the day so that you sleep better at night. . Get out of bed if you are still awake after 15 minutes of trying to sleep. Keep the lights down, but try reading or doing a quiet activity. When you feel sleepy, go back to bed.  Class 2 severe obesity with serious comorbidity and body mass index (BMI) of 37.0 to 37.9  in adult, unspecified obesity type (HCC).  Levora is currently in the action stage of change. As such, her goal is to continue with weight loss efforts. She has agreed to the Category 2 Plan.   Exercise goals: Older adults should follow the adult guidelines. When older adults cannot meet the adult guidelines, they should be as physically active as their abilities and conditions will allow.   Behavioral modification strategies: avoiding temptations and planning for  success.  Marlisa has agreed to follow-up with our clinic in 3 weeks. She was informed of the importance of frequent follow-up visits to maximize her success with intensive lifestyle modifications for her multiple health conditions.   Objective:   There were no vitals taken for this visit. There is no height or weight on file to calculate BMI.  General: Cooperative, alert, well developed, in no acute distress. HEENT: Conjunctivae and lids unremarkable. Cardiovascular: Regular rhythm.  Lungs: Normal work of breathing. Neurologic: No focal deficits.   Lab Results  Component Value Date   CREATININE 1.00 05/27/2019   BUN 25 05/27/2019   NA 138 05/27/2019   K 3.9 05/27/2019   CL 96 05/27/2019   CO2 27 05/27/2019   Lab Results  Component Value Date   ALT 23 05/27/2019   AST 26 05/27/2019   ALKPHOS 51 05/27/2019   BILITOT 0.3 05/27/2019   Lab Results  Component Value Date   HGBA1C 5.3 05/27/2019   HGBA1C 5.4 11/10/2018   HGBA1C 5.8 (H) 07/09/2018   Lab Results  Component Value Date   INSULIN 16.2 05/27/2019   INSULIN 33.0 (H) 11/10/2018   INSULIN 25.0 (H) 07/09/2018   Lab Results  Component Value Date   TSH 2.210 07/09/2018   Lab Results  Component Value Date   CHOL 141 05/27/2019   HDL 55 05/27/2019   LDLCALC 69 05/27/2019   TRIG 88 05/27/2019   Lab Results  Component Value Date   WBC 6.0 07/09/2018   HGB 13.6 07/09/2018   HCT 39.7 07/09/2018   MCV 89 07/09/2018   PLT 241 07/09/2018   No results found for: IRON, TIBC, FERRITIN  Obesity Behavioral Intervention Documentation for Insurance:   Approximately 15 minutes were spent on the discussion below.  ASK: We discussed the diagnosis of obesity with Anija today and Mozella agreed to give [name] permission to discuss obesity behavioral modification therapy today.  ASSESS: Crystal Jackson has the diagnosis of obesity and her BMI today is 37.7. Crystal Jackson is in the action stage of change.   ADVISE: [name] was  educated on the multiple health risks of obesity as well as the benefit of weight loss to improve her health. She was advised of the need for long term treatment and the importance of lifestyle modifications to improve her current health and to decrease her risk of future health problems.  AGREE: Multiple dietary modification options and treatment options were discussed and Ritha agreed to follow the recommendations documented in the above note.  ARRANGE: Kateria was educated on the importance of frequent visits to treat obesity as outlined per CMS and USPSTF guidelines and agreed to schedule her next follow up appointment today.  Attestation Statements:   Reviewed by clinician on day of visit: allergies, medications, problem list, medical history, surgical history, family history, social history, and previous encounter notes.  IMarianna Payment, am acting as transcriptionist for [name], PA-C   I have reviewed the above documentation for accuracy and completeness, and I agree with the above. [facility]  Cephus Richer, PA-C

## 2019-07-09 NOTE — Progress Notes (Signed)
Office: [phone]  /  Fax: [phone]    Date: July 23, 2019   Appointment Start Time: 10:26am Duration: 22 minutes Provider: Lawerance Cruel, Psy.D. Type of Session: Individual Therapy  Location of Patient: Home Location of Provider: Provider's Home Type of Contact: Telepsychological Visit via MyChart Video Visit  Session Content: Crystal Jackson is a 74 y.o. female presenting via MyChart Video Visit for a follow-up appointment to address the previously established treatment goal of increasing coping skills. Today's appointment was a telepsychological visit due to COVID-19. Crystal Jackson provided verbal consent for today's telepsychological appointment and she is aware she is responsible for securing confidentiality on her end of the session. Prior to proceeding with today's appointment, Crystal Jackson's physical location at the time of this appointment was obtained as well a phone number she could be reached at in the event of technical difficulties. Crystal Jackson and this provider participated in today's telepsychological service.   This provider conducted a brief check-in and verbally administered the PHQ-9 and GAD-7. Crystal Jackson shared she started physical therapy, noting it is helpful. Session focused further on mindfulness to assist with coping. She stated she is engaging in mindfulness exercises via YouTube, adding, "I think it helps. It helps to re-set you." Positive reinforcement was provided. Crystal Jackson was led through a mindfulness exercise focusing on emotions, as she described having "blah days" at times and feeling guilty when she deviates from her meal plan. Her experience was processed. Crystal Jackson provided verbal consent during today's appointment for this provider to send the handout fort today's exercise. via e-mail. Crystal Jackson was receptive to today's appointment as evidenced by openness to sharing, responsiveness to feedback, and willingness to continue engaging in mindfulness exercises.  Mental Status  Examination:  Appearance: well groomed and appropriate hygiene  Behavior: appropriate to circumstances Mood: euthymic Affect: mood congruent Speech: normal in rate, volume, and tone Eye Contact: appropriate Psychomotor Activity: appropriate Gait: unable to assess Thought Process: linear, logical, and goal directed  Thought Content/Perception: no hallucinations, delusions, bizarre thinking or behavior reported or observed and no evidence of suicidal and homicidal ideation, plan, and intent Orientation: time, person, place, and purpose of appointment Memory/Concentration: memory, attention, language, and fund of knowledge intact  Insight/Judgment: good  Structured Assessments Results: The Patient Health Questionnaire-9 (PHQ-9) is a self-report measure that assesses symptoms and severity of depression over the course of the last two weeks. Crystal Jackson obtained a score of 3 suggesting minimal depression. Crystal Jackson finds the endorsed symptoms to be somewhat difficult. [0= Not at all; 1= Several days; 2= More than half the days; 3= Nearly every day] Little interest or pleasure in doing things 0  Feeling down, depressed, or hopeless 1  Trouble falling or staying asleep, or sleeping too much 0  Feeling tired or having little energy 1  Poor appetite or overeating 1  Feeling bad about yourself --- or that you are a failure or have let yourself or your family down 0  Trouble concentrating on things, such as reading the newspaper or watching television 0  Moving or speaking so slowly that other people could have noticed? Or the opposite --- being so fidgety or restless that you have been moving around a lot more than usual 0  Thoughts that you would be better off dead or hurting yourself in some way 0  PHQ-9 Score 3    The Generalized Anxiety Disorder-7 (GAD-7) is a brief self-report measure that assesses symptoms of anxiety over the course of the last two weeks. Crystal Jackson obtained a score of 1  suggesting  minimal anxiety. Crystal Jackson finds the endorsed symptoms to be somewhat difficult. [0= Not at all; 1= Several days; 2= Over half the days; 3= Nearly every day] Feeling nervous, anxious, on edge 0  Not being able to stop or control worrying 0  Worrying too much about different things 0  Trouble relaxing 0  Being so restless that it's hard to sit still 0  Becoming easily annoyed or irritable 1  Feeling afraid as if something awful might happen 0  GAD-7 Score 1   Interventions:  Conducted a brief chart review Verbally administered PHQ-9 and GAD-7 for symptom monitoring Provided empathic reflections and validation Employed supportive psychotherapy interventions to facilitate reduced distress and to improve coping skills with identified stressors Engaged patient in mindfulness exercise(s) Employed acceptance and commitment interventions to emphasize mindfulness and acceptance without struggle  Provided positive reinforcement   DSM-5 Diagnosis(es): 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 Crystal Jackson has demonstrated progress in her goal as evidenced by increased awareness of hunger patterns, increased awareness of triggers for emotional eating and reduction in emotional eating. Crystal Jackson also continues to demonstrate willingness to engage in learned skill(s).  Plan: The next appointment will be scheduled in one month, which will be via MyChart Video Visit. The next session will focus on termination.

## 2019-07-10 ENCOUNTER — Encounter (INDEPENDENT_AMBULATORY_CARE_PROVIDER_SITE_OTHER): Payer: Self-pay | Admitting: Physician Assistant

## 2019-07-13 NOTE — Telephone Encounter (Signed)
Please advise (pt of [name]) Jeralene Peters, LPN

## 2019-07-14 ENCOUNTER — Ambulatory Visit: Payer: Medicare Other | Attending: Physician Assistant | Admitting: Physical Therapy

## 2019-07-14 ENCOUNTER — Encounter: Payer: Self-pay | Admitting: Physical Therapy

## 2019-07-14 ENCOUNTER — Other Ambulatory Visit: Payer: Self-pay

## 2019-07-14 DIAGNOSIS — R2681 Unsteadiness on feet: Secondary | ICD-10-CM | POA: Diagnosis present

## 2019-07-14 DIAGNOSIS — R262 Difficulty in walking, not elsewhere classified: Secondary | ICD-10-CM | POA: Diagnosis present

## 2019-07-14 DIAGNOSIS — G8929 Other chronic pain: Secondary | ICD-10-CM | POA: Insufficient documentation

## 2019-07-14 DIAGNOSIS — M545 Low back pain: Secondary | ICD-10-CM | POA: Insufficient documentation

## 2019-07-14 DIAGNOSIS — M6281 Muscle weakness (generalized): Secondary | ICD-10-CM | POA: Insufficient documentation

## 2019-07-14 DIAGNOSIS — M25561 Pain in right knee: Secondary | ICD-10-CM | POA: Diagnosis present

## 2019-07-14 DIAGNOSIS — M25562 Pain in left knee: Secondary | ICD-10-CM | POA: Diagnosis present

## 2019-07-14 NOTE — Therapy (Signed)
[facility] [place] [address]  [address] [place], [place], [address] Phone: [phone]   Fax:  [phone]  Physical Therapy Evaluation  Patient Details  Name: Crystal CrouchRebecca Jackson MRN: [id number] Date of Birth: [birth year] Referring Provider (PT): Richardean CanalGilbert Clark, MD   Encounter Date: 07/14/2019   PT End of Session - 07/14/19 1459    Visit Number 1    Number of Visits 9    Date for PT Re-Evaluation 09/08/19    Authorization Type U[employer] Medicare    PT Start Time 1359    PT Stop Time 1451    PT Time Calculation (min) 52 min    Activity Tolerance Patient tolerated treatment well    Behavior During Therapy WFL for tasks assessed/performed           Past Medical History:  Diagnosis Date   Anxiety    Arthritis    Asthma    Back pain    Bilateral swelling of feet    Constipation    Depression    Gallbladder problem    GERD (gastroesophageal reflux disease)    History of low potassium    Hypertension    Joint pain    Obesity    Palpitations    Shortness of breath    Sleep apnea     Past Surgical History:  Procedure Laterality Date   ABDOMINAL HYSTERECTOMY     APPENDECTOMY     CHOLECYSTECTOMY     KNEE SURGERY Left    SHOULDER SURGERY Right     There were no vitals filed for this visit.    Subjective Assessment - 07/14/19 1401    Subjective Patient reports that by the end of her last POC in late February 2021, she was in pretty good shape. [place] getting out of the car. The next day she had considerable swelling in the knees- was seen by Ortho and they aspirated the R knee and injected B knees with cortisone. D/t this pain she has been less active and has noticed some imbalance. Notes that she now walks gingerly and is especially nervous walking on grass. Notes that she had a fall earlier this year when walking in the dark, hitting her forehead on a  windowsill and having to have her husband help her stand up. Denies any lasting injury. Thinking about getting a L TKA later this year. Still having LBP that is located along B LB without N/T or B&B changes.  Worse with housework and yard work. Better with sitting. Also having pain in the feet with prolonged standing.    Pertinent History SOB, palpitations, HTN, GERD, depression, B feet swelling, back pain, asthma, anxiety, R shoulder surgery, L knee surgery    Limitations Lifting;Standing;Walking;House hold activities    How long can you sit comfortably? unlimited    How long can you stand comfortably? 30-45 min    How long can you walk comfortably? 1,000 steps limited    Diagnostic tests none recent    Patient Stated Goals get rid of pain    Currently in Pain? Yes    Pain Score 0-No pain    Pain Location Back    Pain Orientation Right;Left;Lower    Pain Descriptors / Indicators Aching    Pain Type Chronic pain    Multiple Pain Sites Yes    Pain Score 0    Pain Location Knee    Pain Orientation Right;Left  Pain Descriptors / Indicators Aching    Pain Type Chronic pain              [facility] PT Assessment - 07/14/19 1411      Assessment   Medical Diagnosis Primary OA of R knee, Primary OA of L knee, spinal stenosis of lumbosacral region    Referring Provider (PT) Richardean Canal, MD    Onset Date/Surgical Date 03/04/19    Next MD Visit not scheduled    Prior Therapy yes- LBP and knee pain      Precautions   Precautions None      Balance Screen   Has the patient fallen in the past 6 months Yes    How many times? 1   fell in the dark hitting head on windowsill   Has the patient had a decrease in activity level because of a fear of falling?  Yes    Is the patient reluctant to leave their home because of a fear of falling?  No      Home Tourist information centre manager residence    Living Arrangements Spouse/significant other    Available Help at Discharge Family     Type of Home House    Home Access Stairs to enter    Entrance Stairs-Number of Steps 6    Entrance Stairs-Rails Right;Left    Home Layout One level    Home Equipment [place] - single point      Prior Function   Level of Independence Independent    Vocation Retired    Leisure gardening, walking      Cognition   Overall Cognitive Status Within Functional Limits for tasks assessed      Observation/Other Assessments   Observations mild-mod edema over B knees      Sensation   Light Touch Appears Intact   reports N/T in B toes     Coordination   Gross Motor Movements are Fluid and Coordinated Yes      Posture/Postural Control   Posture/Postural Control Postural limitations    Postural Limitations Forward head;Rounded Shoulders      ROM / Strength   AROM / PROM / Strength AROM;Strength;PROM      AROM   AROM Assessment Site Lumbar;Knee;Ankle    Right/Left Knee Right;Left    Right Knee Extension 0    Right Knee Flexion 125    Left Knee Extension 0    Left Knee Flexion 124    Right/Left Ankle Right;Left    Right Ankle Dorsiflexion 10    Left Ankle Dorsiflexion 10    Lumbar Flexion toes    Lumbar Extension moderately limited   c/o "equilibrium off"   Lumbar - Right Side Bend proximal shin   mild discomfort   Lumbar - Left Side Bend jt line   mild discomfort   Lumbar - Right Rotation WNL    Lumbar - Left Rotation WNL      PROM   PROM Assessment Site Knee    Right/Left Knee Right;Left    Right Knee Extension -5    Right Knee Flexion 132   discomfort   Left Knee Extension 0    Left Knee Flexion 126   discomfort     Strength   Strength Assessment Site Hip;Knee;Ankle    Right/Left Hip Right;Left    Right Hip Flexion 4/5    Right Hip ABduction 4+/5    Right Hip ADduction 4+/5    Left Hip Flexion 4/5    Left  Hip ABduction 4+/5    Left Hip ADduction 4+/5    Right/Left Knee Right;Left    Right Knee Flexion 4+/5    Right Knee Extension 4/5   pain   Left Knee Flexion 4+/5      Left Knee Extension 4/5   pain   Right/Left Ankle Right;Left    Right Ankle Dorsiflexion 4+/5    Right Ankle Plantar Flexion 4+/5    Right Ankle Inversion --    Right Ankle Eversion --    Left Ankle Dorsiflexion 4/5    Left Ankle Plantar Flexion 4+/5      Palpation   Patella mobility B mildly limited M/L, moderately limited superior/inferior    Palpation comment no TTP over B knees, L calf tight to palpation, increased soft tissue restriction in R lumbar paraspinals and L piriformis      Ambulation/Gait   Assistive device None    Gait Pattern Step-through pattern;Decreased stance time - left;Decreased step length - right;Decreased weight shift to left;Lateral trunk lean to right;Lateral hip instability;Lateral trunk lean to left;Trunk flexed    Ambulation Surface Level;Indoor    Gait velocity decreased                      Objective measurements completed on examination: See above findings.               PT Education - 07/14/19 1458    Education Details prognosis, POC, HEP- Access Code: [email or web address]; discussion on possible need to f/u with MD about cause of imbalance and possible need for vestibular assessment    Person(s) Educated Patient    Methods Explanation;Demonstration;Tactile cues;Verbal cues;Handout    Comprehension Verbalized understanding;Returned demonstration            PT Short Term Goals - 07/14/19 1510      PT SHORT TERM GOAL #1   Title Patient to be independent with initial HEP.    Time 3    Period Weeks    Status New    Target Date 08/04/19             PT Long Term Goals - 07/14/19 1510      PT LONG TERM GOAL #1   Title Patient to be independent with advanced HEP.    Time 8    Period Weeks    Status New    Target Date 09/08/19      PT LONG TERM GOAL #2   Title Patient to report 70% improvement in B knee and LBP.    Time 8    Period Weeks    Status New    Target Date 09/08/19      PT LONG TERM GOAL #3   Title  Patient to demonstrate B LE strength >=4+/5.    Time 8    Period Weeks    Status New    Target Date 09/08/19      PT LONG TERM GOAL #4   Title Patient to demonstrate lumbar AROM WFL and without pain limiting.    Time 8    Period Weeks    Status New    Target Date 09/08/19      PT LONG TERM GOAL #5   Title Patient to score >21/24 on DGI in order to decrease risk of falls.    Time 8    Period Weeks    Status New    Target Date 09/08/19  Plan - 07/14/19 1500    Clinical Impression Statement Patient is a 73y/o F presenting to OPPT with c/o chronic B knee and LBP since March 2021. Patient notes good relief of pain after last POC in late February, but notes return of pain after a long car ride. Now struggling with B knee pain and edema. LBP occurs over B sides- denies N/T down LEs or B&B changes, however does report some isolated N/T in B toes. Patient admits to 1 fall that occurred in the dark in the last year and also notes instability when walking on grass. Patient today presenting with rounded posture, good but painful B knee ROM, limited and painful lumbar AROM, B quad weakness, B patellar hypomobility, and gait deviations. Patient was educated on gentle stretching and strengthening HEP and reported understanding. Would benefit from skilled PT services 1x/week for 8 weeks to address aforementioned impairments.    Personal Factors and Comorbidities Age;Sex;Comorbidity 3+;Fitness;Past/Current Experience;Time since onset of injury/illness/exacerbation    Comorbidities SOB, palpitations, HTN, GERD, depression, B feet swelling, back pain, asthma, anxiety, R shoulder surgery, L knee surgery    Examination-Activity Limitations Bathing;Sleep;Bed Mobility;Bend;Squat;Stairs;Caring for Others;Carry;Stand;Toileting;Dressing;Transfers;Hygiene/Grooming;Lift;Reach Overhead;Locomotion Level    Examination-Participation Restrictions Church;Cleaning;Shop;Volunteer;Community  Activity;Driving;Yard Work;Laundry;Meal Prep    Stability/Clinical Decision Making Stable/Uncomplicated    Clinical Decision Making Low    Rehab Potential Good    PT Frequency 1x / week    PT Duration 8 weeks    PT Treatment/Interventions ADLs/Self Care Home Management;Cryotherapy;Canalith Repostioning;Electrical Stimulation;Moist Heat;Traction;Balance training;Therapeutic exercise;Therapeutic activities;Functional mobility training;Stair training;Gait training;Ultrasound;Neuromuscular re-education;Patient/family education;Manual techniques;Vestibular;Vasopneumatic Device;Taping;Energy conservation;Dry needling;Passive range of motion;Iontophoresis 4mg /ml Dexamethasone    PT Next Visit Plan reassess HEP, M-CTSIB and DGI, progress quad strength and lumbopelvic ROM    Consulted and Agree with Plan of Care Patient           Patient will benefit from skilled therapeutic intervention in order to improve the following deficits and impairments:  Abnormal gait, Hypomobility, Increased edema, Decreased activity tolerance, Decreased strength, Increased fascial restricitons, Pain, Decreased balance, Difficulty walking, Increased muscle spasms, Improper body mechanics, Decreased range of motion, Postural dysfunction, Impaired flexibility  Visit Diagnosis: Chronic bilateral low back pain without sciatica  Chronic pain of left knee  Chronic pain of right knee  Difficulty in walking, not elsewhere classified  Unsteadiness on feet     Problem List Patient Active Problem List   Diagnosis Date Noted   OSA (obstructive sleep apnea) 09/03/2018   Mild persistent asthma without complication 09/03/2018   Severe obesity (BMI >= 40) (HCC) 04/14/2018   Unilateral primary osteoarthritis, left knee 04/14/2018      04/16/2018, PT, DPT 07/14/19 3:16 PM    [facility] [place] [address]  [address] [place], [place], [place] Phone:  [phone]   Fax:  [phone]  Name: Dorla Guizar MRN: Crystal Jackson Date of Birth: 03/30/1945

## 2019-07-22 ENCOUNTER — Other Ambulatory Visit: Payer: Self-pay

## 2019-07-22 ENCOUNTER — Ambulatory Visit: Payer: Medicare Other

## 2019-07-22 DIAGNOSIS — M545 Low back pain: Secondary | ICD-10-CM | POA: Diagnosis not present

## 2019-07-22 DIAGNOSIS — R2681 Unsteadiness on feet: Secondary | ICD-10-CM

## 2019-07-22 DIAGNOSIS — R262 Difficulty in walking, not elsewhere classified: Secondary | ICD-10-CM

## 2019-07-22 DIAGNOSIS — G8929 Other chronic pain: Secondary | ICD-10-CM

## 2019-07-22 DIAGNOSIS — M25562 Pain in left knee: Secondary | ICD-10-CM

## 2019-07-22 DIAGNOSIS — M25561 Pain in right knee: Secondary | ICD-10-CM

## 2019-07-22 NOTE — Therapy (Signed)
[facility] [address]  [address] [place], [place], [address] Phone: [phone]   Fax:  [phone]  Physical Therapy Treatment  Patient Details  Name: Crystal Jackson MRN: [id number] Date of Birth: March 24, 1945 Referring Provider (PT): Richardean Canal, MD   Encounter Date: 07/22/2019   PT End of Session - 07/22/19 1413    Visit Number 2    Number of Visits 9    Date for PT Re-Evaluation 09/08/19    Authorization Type UHC Medicare    PT Start Time 1404    PT Stop Time 1450    PT Time Calculation (min) 46 min    Activity Tolerance Patient tolerated treatment well    Behavior During Therapy [facility] for tasks assessed/performed           Past Medical History:  Diagnosis Date  . Anxiety   . Arthritis   . Asthma   . Back pain   . Bilateral swelling of feet   . Constipation   . Depression   . Gallbladder problem   . GERD (gastroesophageal reflux disease)   . History of low potassium   . Hypertension   . Joint pain   . Obesity   . Palpitations   . Shortness of breath   . Sleep apnea     Past Surgical History:  Procedure Laterality Date  . ABDOMINAL HYSTERECTOMY    . APPENDECTOMY    . CHOLECYSTECTOMY    . KNEE SURGERY Left   . SHOULDER SURGERY Right     There were no vitals filed for this visit.   Subjective Assessment - 07/22/19 1411    Subjective Doing ok.    Pertinent History SOB, palpitations, HTN, GERD, depression, B feet swelling, back pain, asthma, anxiety, R shoulder surgery, L knee surgery    Diagnostic tests none recent    Patient Stated Goals get rid of pain    Currently in Pain? No/denies    Pain Score 0-No pain   up to 4-5/10 after standing in kitchen   Pain Location Back    Pain Orientation Right;Left;Lower    Multiple Pain Sites Yes    Pain Score 6    Pain Location Knee    Pain Orientation Left    Pain Descriptors / Indicators Constant   " something doing something in there" bone/on/bone"     Pain Type Chronic pain              [facility] PT Assessment - 07/22/19 0001      Standardized Balance Assessment   Standardized Balance Assessment Dynamic Gait Index      Dynamic Gait Index   Level Surface Normal    Change in Gait Speed Mild Impairment    Gait with Horizontal Head Turns Moderate Impairment    Gait with Vertical Head Turns Mild Impairment    Gait and Pivot Turn Mild Impairment    Step Over Obstacle Mild Impairment    Step Around Obstacles Mild Impairment    Steps Moderate Impairment    Total Score 15    DGI comment: 15/30                         [facility] Adult PT Treatment/Exercise - 07/22/19 0001      Lumbar Exercises: Stretches   Lower Trunk Rotation Limitations 5" x 10 reps     Figure 4 Stretch 1 rep;30 seconds    Figure 4 Stretch Limitations B  Gastroc Stretch Right;Left;1 rep;30 seconds    Gastroc Stretch Limitations leaning into wall       Lumbar Exercises: Aerobic   Nustep Lvl 2, 6 min (UE/LE)      Lumbar Exercises: Seated   Sit to Stand 10 reps   Patient reporting challange with this activity   Sit to Stand Limitations x 2 no hands, x 4 reps R hand pushoff, 4 more reps L hand pushoff       Lumbar Exercises: Supine   Straight Leg Raise 10 reps;3 seconds      Lumbar Exercises: Sidelying   Other Sidelying Lumbar Exercises B "open book" stretch 3" x 10 reps                     PT Short Term Goals - 07/22/19 1414      PT SHORT TERM GOAL #1   Title Patient to be independent with initial HEP.    Time 3    Period Weeks    Status On-going    Target Date 08/04/19             PT Long Term Goals - 07/22/19 1414      PT LONG TERM GOAL #1   Title Patient to be independent with advanced HEP.    Time 8    Period Weeks    Status On-going      PT LONG TERM GOAL #2   Title Patient to report 70% improvement in B knee and LBP.    Time 8    Period Weeks    Status On-going      PT LONG TERM GOAL #3   Title Patient  to demonstrate B LE strength >=4+/5.    Time 8    Period Weeks    Status On-going      PT LONG TERM GOAL #4   Title Patient to demonstrate lumbar AROM WFL and without pain limiting.    Time 8    Period Weeks    Status On-going      PT LONG TERM GOAL #5   Title Patient to score >21/24 on DGI in order to decrease risk of falls.    Time 8    Period Weeks    Status On-going                 Plan - 07/22/19 1415    Clinical Impression Statement Crystal Jackson reporting she has been performing some of HEP activities laying on floor and this has been difficult for her requiring husband to help her up.  Encouraged pt. to perform all "laying down exercises" on bed to avoid undue difficulty.  Scored 15/30 on DGI revealing significant fall risk and most balance deficit with dynamic gait head turns horizontally and step-overs/stairs.  HEP review revealed a few minor adjustments/instruction required for proper calf stretching technique on wall however good tolerance.  Ended visit pain without increased pain from baseline thus modalities deferred.    Comorbidities SOB, palpitations, HTN, GERD, depression, B feet swelling, back pain, asthma, anxiety, R shoulder surgery, L knee surgery    Rehab Potential Good    PT Frequency 1x / week    PT Duration 8 weeks    PT Treatment/Interventions ADLs/Self Care Home Management;Cryotherapy;Canalith Repostioning;Electrical Stimulation;Moist Heat;Traction;Balance training;Therapeutic exercise;Therapeutic activities;Functional mobility training;Stair training;Gait training;Ultrasound;Neuromuscular re-education;Patient/family education;Manual techniques;Vestibular;Vasopneumatic Device;Taping;Energy conservation;Dry needling;Passive range of motion;Iontophoresis 4mg /ml Dexamethasone    PT Next Visit Plan M-CTSIB, progress quad strength and lumbopelvic ROM    Consulted  and Agree with Plan of Care Patient           Patient will benefit from skilled therapeutic  intervention in order to improve the following deficits and impairments:  Abnormal gait, Hypomobility, Increased edema, Decreased activity tolerance, Decreased strength, Increased fascial restricitons, Pain, Decreased balance, Difficulty walking, Increased muscle spasms, Improper body mechanics, Decreased range of motion, Postural dysfunction, Impaired flexibility  Visit Diagnosis: Chronic bilateral low back pain without sciatica  Chronic pain of left knee  Chronic pain of right knee  Difficulty in walking, not elsewhere classified  Unsteadiness on feet     Problem List Patient Active Problem List   Diagnosis Date Noted  . OSA (obstructive sleep apnea) 09/03/2018  . Mild persistent asthma without complication 09/03/2018  . Severe obesity (BMI >= 40) (HCC) 04/14/2018  . Unilateral primary osteoarthritis, left knee 04/14/2018    Kermit Balo, PTA 07/22/19 4:50 PM   [facility] [address]  [address] [place], [place], [address] Phone: [phone]   Fax:  [phone]  Name: Crystal Jackson MRN: [id number] Date of Birth: 03-30-1945

## 2019-07-23 ENCOUNTER — Telehealth (INDEPENDENT_AMBULATORY_CARE_PROVIDER_SITE_OTHER): Payer: Medicare Other | Admitting: Psychology

## 2019-07-23 DIAGNOSIS — F3341 Major depressive disorder, recurrent, in partial remission: Secondary | ICD-10-CM

## 2019-07-29 ENCOUNTER — Encounter: Payer: Self-pay | Admitting: Physical Therapy

## 2019-07-29 ENCOUNTER — Ambulatory Visit: Payer: Medicare Other | Admitting: Physical Therapy

## 2019-07-29 ENCOUNTER — Other Ambulatory Visit: Payer: Self-pay

## 2019-07-29 DIAGNOSIS — M545 Low back pain: Secondary | ICD-10-CM | POA: Diagnosis not present

## 2019-07-29 DIAGNOSIS — G8929 Other chronic pain: Secondary | ICD-10-CM

## 2019-07-29 DIAGNOSIS — R262 Difficulty in walking, not elsewhere classified: Secondary | ICD-10-CM

## 2019-07-29 DIAGNOSIS — M6281 Muscle weakness (generalized): Secondary | ICD-10-CM

## 2019-07-29 DIAGNOSIS — R2681 Unsteadiness on feet: Secondary | ICD-10-CM

## 2019-07-29 DIAGNOSIS — M25562 Pain in left knee: Secondary | ICD-10-CM

## 2019-07-29 NOTE — Patient Instructions (Addendum)
   Kinesiology tape  What is kinesiology tape?  There are many brands of kinesiology tape. KTape, Rock Tape, Body Sport, Dynamic tape, to name a few.  It is an elasticized tape designed to support the body's natural healing process. This tape provides stability and support to muscles and joints without restricting motion.  It can also help decrease swelling in the area of application.  How does it work?  The tape microscopically lifts and decompresses the skin to allow for drainage of lymph (swelling) to flow away from area, reducing inflammation. The tape has the ability to help re-educate the neuromuscular system by targeting specific receptors in the skin. The presence of the tape increases the body's awareness of posture and body mechanics.  Do not use with:  . Open wounds . Skin lesions . Adhesive allergies  In some rare cases, mild/moderate skin irritation can occur. This can include redness, itchiness, or hives. If this occurs, immediately remove tape and consult your primary care physician if symptoms are severe or do not resolve within 2 days.  Safe removal of the tape:  To remove tape safely, hold nearby skin with one hand and gentle roll tape down with other hand. You can apply oil or conditioner to tape while in shower prior to removal to loosen adhesive. DO NOT swiftly rip tape off like a band-aid, as this could cause skin tears and additional skin irritation.     For questions, please contact your therapist at:  [facility] [place] [address]  [address] [place], [place], [address] Phone: [phone]   Fax:  [phone]     

## 2019-07-29 NOTE — Therapy (Signed)
[facility] [address]  [address] [place], [place], [address] Phone: [phone]   Fax:  [phone]  Physical Therapy Treatment  Patient Details  Name: Crystal Jackson MRN: [id number] Date of Birth: February 10, 1945 Referring Provider (PT): Richardean Canal, MD   Encounter Date: 07/29/2019   PT End of Session - 07/29/19 1444    Visit Number 3    Number of Visits 9    Date for PT Re-Evaluation 09/08/19    Authorization Type [employer] Medicare    PT Start Time 1400    PT Stop Time 1441    PT Time Calculation (min) 41 min    Equipment Utilized During Treatment Gait belt    Activity Tolerance Patient tolerated treatment well    Behavior During Therapy [facility] for tasks assessed/performed           Past Medical History:  Diagnosis Date  . Anxiety   . Arthritis   . Asthma   . Back pain   . Bilateral swelling of feet   . Constipation   . Depression   . Gallbladder problem   . GERD (gastroesophageal reflux disease)   . History of low potassium   . Hypertension   . Joint pain   . Obesity   . Palpitations   . Shortness of breath   . Sleep apnea     Past Surgical History:  Procedure Laterality Date  . ABDOMINAL HYSTERECTOMY    . APPENDECTOMY    . CHOLECYSTECTOMY    . KNEE SURGERY Left   . SHOULDER SURGERY Right     There were no vitals filed for this visit.   Subjective Assessment - 07/29/19 1359    Subjective THings have been going okay except for the STS exercise. Feels like it bothered her back.    Pertinent History SOB, palpitations, HTN, GERD, depression, B feet swelling, back pain, asthma, anxiety, R shoulder surgery, L knee surgery    Diagnostic tests none recent    Patient Stated Goals get rid of pain    Currently in Pain? No/denies                             [facility] Adult PT Treatment/Exercise - 07/29/19 0001      Neuro Re-ed    Neuro Re-ed Details  STS sitting and standing on foam with CGA  x5, with EC x5; romberg + horizontal VOR 2x10   poor eccentric control upon sitting     Exercises   Exercises Lumbar      Lumbar Exercises: Aerobic   Nustep Lvl 3, 6 min (UE/LE)      Lumbar Exercises: Seated   Sit to Stand 10 reps   without UE support   Sit to Stand Limitations last 6 reps sitting on airex      Lumbar Exercises: Quadruped   Madcat/Old Horse 10 reps   cues to encourage anterior pelvic tilt     Manual Therapy   Manual Therapy Taping    Kinesiotex Create Space      Kinesiotix   Create Space R knee chondromalacia taping pattern                   PT Education - 07/29/19 1442    Education Details update to HEP; edu on KT tape precaustions, wear time, removal    Person(s) Educated Patient    Methods Explanation;Demonstration;Tactile cues;Verbal cues;Handout    Comprehension  Verbalized understanding;Returned demonstration            PT Short Term Goals - 07/22/19 1414      PT SHORT TERM GOAL #1   Title Patient to be independent with initial HEP.    Time 3    Period Weeks    Status On-going    Target Date 08/04/19             PT Long Term Goals - 07/22/19 1414      PT LONG TERM GOAL #1   Title Patient to be independent with advanced HEP.    Time 8    Period Weeks    Status On-going      PT LONG TERM GOAL #2   Title Patient to report 70% improvement in B knee and LBP.    Time 8    Period Weeks    Status On-going      PT LONG TERM GOAL #3   Title Patient to demonstrate B LE strength >=4+/5.    Time 8    Period Weeks    Status On-going      PT LONG TERM GOAL #4   Title Patient to demonstrate lumbar AROM WFL and without pain limiting.    Time 8    Period Weeks    Status On-going      PT LONG TERM GOAL #5   Title Patient to score >21/24 on DGI in order to decrease risk of falls.    Time 8    Period Weeks    Status On-going                 Plan - 07/29/19 1444    Clinical Impression Statement Patient reporting LBP  with STS at home, thus reviewed this exercise to assess for patient's tolerance. Patient with report of mild knee pain with this exercise, thus elevated seat with better tolerance. Added compliant surface which patient reporting unsteadiness, but with good use of ankle strategy. Able to progress with EC, with patient demonstrating more instability but no LOB. Instability again noted with Romberg with gaze stabilization, but not to the point of LOB. Applied KT tape to R knee for hopeful improvement in pain. Updated HEP with exercises that were well-tolerated today. Patient reported understanding and without complaints at end of session.    Comorbidities SOB, palpitations, HTN, GERD, depression, B feet swelling, back pain, asthma, anxiety, R shoulder surgery, L knee surgery    PT Treatment/Interventions ADLs/Self Care Home Management;Cryotherapy;Canalith Repostioning;Electrical Stimulation;Moist Heat;Traction;Balance training;Therapeutic exercise;Therapeutic activities;Functional mobility training;Stair training;Gait training;Ultrasound;Neuromuscular re-education;Patient/family education;Manual techniques;Vestibular;Vasopneumatic Device;Taping;Energy conservation;Dry needling;Passive range of motion;Iontophoresis 4mg /ml Dexamethasone    PT Next Visit Plan balance exercises, progress quad strength and lumbopelvic ROM    Consulted and Agree with Plan of Care Patient           Patient will benefit from skilled therapeutic intervention in order to improve the following deficits and impairments:  Abnormal gait, Hypomobility, Increased edema, Decreased activity tolerance, Decreased strength, Increased fascial restricitons, Pain, Decreased balance, Difficulty walking, Increased muscle spasms, Improper body mechanics, Decreased range of motion, Postural dysfunction, Impaired flexibility  Visit Diagnosis: Chronic bilateral low back pain without sciatica  Chronic pain of left knee  Chronic pain of right  knee  Difficulty in walking, not elsewhere classified  Unsteadiness on feet  Muscle weakness (generalized)     Problem List Patient Active Problem List   Diagnosis Date Noted  . OSA (obstructive sleep apnea) 09/03/2018  . Mild persistent asthma  without complication 09/03/2018  . Severe obesity (BMI >= 40) (HCC) 04/14/2018  . Unilateral primary osteoarthritis, left knee 04/14/2018      Anette Guarneri, PT, DPT 07/29/19 2:46 PM   [facility] [address]  [address] [place], [place], [address] Phone: [phone]   Fax:  [phone]  Name: Crystal Jackson MRN: [id number] Date of Birth: [birth year]

## 2019-07-30 ENCOUNTER — Ambulatory Visit (INDEPENDENT_AMBULATORY_CARE_PROVIDER_SITE_OTHER): Payer: Medicare Other | Admitting: Physician Assistant

## 2019-07-30 ENCOUNTER — Encounter (INDEPENDENT_AMBULATORY_CARE_PROVIDER_SITE_OTHER): Payer: Self-pay | Admitting: Physician Assistant

## 2019-07-30 ENCOUNTER — Other Ambulatory Visit: Payer: Self-pay

## 2019-07-30 ENCOUNTER — Other Ambulatory Visit (INDEPENDENT_AMBULATORY_CARE_PROVIDER_SITE_OTHER): Payer: Self-pay | Admitting: Physician Assistant

## 2019-07-30 VITALS — BP 111/72 | HR 66 | Temp 97.6°F | Ht 64.0 in | Wt 219.0 lb

## 2019-07-30 DIAGNOSIS — I10 Essential (primary) hypertension: Secondary | ICD-10-CM

## 2019-07-30 DIAGNOSIS — Z6837 Body mass index (BMI) 37.0-37.9, adult: Secondary | ICD-10-CM

## 2019-07-30 DIAGNOSIS — R7303 Prediabetes: Secondary | ICD-10-CM

## 2019-07-30 DIAGNOSIS — E66812 Obesity, class 2: Secondary | ICD-10-CM

## 2019-07-30 MED ORDER — CHLORTHALIDONE 25 MG PO TABS: 12.5000 mg | ORAL_TABLET | Freq: Every day | ORAL | 0 refills | Status: DC

## 2019-07-30 NOTE — Progress Notes (Signed)
Chief Complaint:   OBESITY Crystal Jackson is here to discuss her progress with her obesity treatment plan along with follow-up of her obesity related diagnoses. Crystal Jackson is following a lower carbohydrate, vegetable and lean protein rich diet plan and the Category 2 Plan and states she is following her eating plan approximately 75-80% of the time. [name] states she is doing PT 30 minutes 3-4 times per week.  Today's visit was #: 23 Starting weight: 242 lbs Starting date: 07/09/2018 Today's weight: 219 lbs Today's date: 07/30/2019 Total lbs lost to date: 23 Total lbs lost since last in-office visit: 0  Interim History: Comfort reports that she is having trouble staying on track due to "summer foods" such as hot dogs and hamburgers. She declines GLP-1 today to help with decreasing her hunger.  Subjective:   Essential hypertension. Blood pressure is controlled. She is tolerating her medications without side effects.  BP Readings from Last 3 Encounters:  07/30/19 111/72  06/22/19 117/63  05/27/19 107/65   Lab Results  Component Value Date   CREATININE 1.00 05/27/2019   CREATININE 0.91 11/10/2018   CREATININE 1.07 (H) 08/25/2018   Prediabetes. [name] has a diagnosis of prediabetes based on her elevated HgA1c and was informed this puts her at greater risk of developing diabetes. She continues to work on diet and exercise to decrease her risk of diabetes. She denies nausea or hypoglycemia. Crystal Jackson is on no medication. She endorses polyphagia.  Lab Results  Component Value Date   HGBA1C 5.3 05/27/2019   Lab Results  Component Value Date   INSULIN 16.2 05/27/2019   INSULIN 33.0 (H) 11/10/2018   INSULIN 25.0 (H) 07/09/2018   Assessment/Plan:   Essential hypertension. Crystal Jackson is working on healthy weight loss and exercise to improve blood pressure control. We will watch for signs of hypotension as she continues her lifestyle modifications. Refill was given for chlorthalidone  (HYGROTON) 25 MG tablet #30 with 0 refills.  Prediabetes. Crystal Jackson will continue to work on weight loss, exercise, and decreasing simple carbohydrates to help decrease the risk of diabetes. We discussed GLP-1 with [name] today, which she declines.  Class 2 severe obesity with serious comorbidity and body mass index (BMI) of 37.0 to 37.9 in adult, unspecified obesity type (HCC).  [name] is currently in the action stage of change. As such, her goal is to continue with weight loss efforts. She has agreed to the Category 2 Plan.   Exercise goals: Older adults should follow the adult guidelines. When older adults cannot meet the adult guidelines, they should be as physically active as their abilities and conditions will allow.   Behavioral modification strategies: increasing lean protein intake and meal planning and cooking strategies.  Crystal Jackson has agreed to follow-up with our clinic in 3 weeks. She was informed of the importance of frequent follow-up visits to maximize her success with intensive lifestyle modifications for her multiple health conditions.   Objective:   Blood pressure 111/72, pulse 66, temperature 97.6 F (36.4 C), temperature source Oral, height 5\' 4"  (1.626 m), weight (!) 219 lb (99.3 kg), SpO2 97 %. Body mass index is 37.59 kg/m.  General: Cooperative, alert, well developed, in no acute distress. HEENT: Conjunctivae and lids unremarkable. Cardiovascular: Regular rhythm.  Lungs: Normal work of breathing. Neurologic: No focal deficits.   Lab Results  Component Value Date   CREATININE 1.00 05/27/2019   BUN 25 05/27/2019   NA 138 05/27/2019   K 3.9 05/27/2019   CL 96 05/27/2019  CO2 27 05/27/2019   Lab Results  Component Value Date   ALT 23 05/27/2019   AST 26 05/27/2019   ALKPHOS 51 05/27/2019   BILITOT 0.3 05/27/2019   Lab Results  Component Value Date   HGBA1C 5.3 05/27/2019   HGBA1C 5.4 11/10/2018   HGBA1C 5.8 (H) 07/09/2018   Lab Results  Component  Value Date   INSULIN 16.2 05/27/2019   INSULIN 33.0 (H) 11/10/2018   INSULIN 25.0 (H) 07/09/2018   Lab Results  Component Value Date   TSH 2.210 07/09/2018   Lab Results  Component Value Date   CHOL 141 05/27/2019   HDL 55 05/27/2019   LDLCALC 69 05/27/2019   TRIG 88 05/27/2019   Lab Results  Component Value Date   WBC 6.0 07/09/2018   HGB 13.6 07/09/2018   HCT 39.7 07/09/2018   MCV 89 07/09/2018   PLT 241 07/09/2018   No results found for: IRON, TIBC, FERRITIN  Obesity Behavioral Intervention Documentation for Insurance:   Approximately 15 minutes were spent on the discussion below.  ASK: We discussed the diagnosis of obesity with Crystal Jackson today and Crystal Jackson agreed to give [name] permission to discuss obesity behavioral modification therapy today.  ASSESS: [name] has the diagnosis of obesity and her BMI today is 37.7. [name] is in the action stage of change.   ADVISE: Crystal Jackson was educated on the multiple health risks of obesity as well as the benefit of weight loss to improve her health. She was advised of the need for long term treatment and the importance of lifestyle modifications to improve her current health and to decrease her risk of future health problems.  AGREE: Multiple dietary modification options and treatment options were discussed and Crystal Jackson agreed to follow the recommendations documented in the above note.  ARRANGE: Crystal Jackson was educated on the importance of frequent visits to treat obesity as outlined per CMS and USPSTF guidelines and agreed to schedule her next follow up appointment today.  Attestation Statements:   Reviewed by clinician on day of visit: allergies, medications, problem list, medical history, surgical history, family history, social history, and previous encounter notes.  IMarianna Payment, am acting as transcriptionist for Crystal Cliche, PA-C   I have reviewed the above documentation for accuracy and completeness, and I agree with the  above. Crystal Cliche, PA-C

## 2019-08-05 ENCOUNTER — Ambulatory Visit: Payer: Medicare Other

## 2019-08-06 ENCOUNTER — Encounter (INDEPENDENT_AMBULATORY_CARE_PROVIDER_SITE_OTHER): Payer: Self-pay | Admitting: Physician Assistant

## 2019-08-06 NOTE — Telephone Encounter (Signed)
Thank you :)

## 2019-08-06 NOTE — Progress Notes (Signed)
  Office: [phone]  /  Fax: [phone]    Date: August 20, 2019   Appointment Start Time: 10:33am Duration: 22 minutes Provider: Lawerance Cruel, Psy.D. Type of Session: Individual Therapy  Location of Patient: Home Location of Provider: Provider's Home Type of Contact: Telepsychological Visit via MyChart Video Visit  Session Content: This provider called Crystal Jackson at 10:32am as she did not present for the telepsychological appointment. She stated she is having computer problems. Assistance was provided. As such, today's appointment was initiated 3 minutes late. Crystal Jackson is a 74 y.o. female presenting for a follow-up appointment to address the previously established treatment goal of increasing coping skills. Today's appointment was a telepsychological visit due to COVID-19. Holle provided verbal consent for today's telepsychological appointment and she is aware she is responsible for securing confidentiality on her end of the session. Prior to proceeding with today's appointment, [name]'s physical location at the time of this appointment was obtained as well a phone number she could be reached at in the event of technical difficulties. [name] and this provider participated in today's telepsychological service.   This provider conducted a brief check-in. [name] reported dealing with "a whole lot of family stuff" and "house stuff." She stated she recently loss two pounds and believes she is doing better with her meal plan. Positive reinforcement was provided. She acknowledged she has not engaged in mindfulness regularly, but expressed desire to incorporate exercises into her routine. Thus, she was engaged in problem solving to assist her with the aforementioned. She reported willingness to engage in a mindfulness exercise every morning when sitting in her den to drink coffee. She was led through a mindfulness exercise during today's appointment and her experience was processed. [name] was  receptive to today's appointment as evidenced by openness to sharing, responsiveness to feedback, and willingness to continue engaging in learned skills.  Mental Status Examination:  Appearance: well groomed and appropriate hygiene  Behavior: appropriate to circumstances Mood: euthymic Affect: mood congruent Speech: normal in rate, volume, and tone Eye Contact: appropriate Psychomotor Activity: appropriate Gait: unable to assess Thought Process: linear, logical, and goal directed  Thought Content/Perception: no hallucinations, delusions, bizarre thinking or behavior reported or observed and no evidence of suicidal and homicidal ideation, plan, and intent Orientation: time, person, place, and purpose of appointment Memory/Concentration: memory, attention, language, and fund of knowledge intact  Insight/Judgment: good  Interventions:  Conducted a brief chart review Provided empathic reflections and validation Employed supportive psychotherapy interventions to facilitate reduced distress and to improve coping skills with identified stressors Engaged patient in problem solving Engaged patient in a mindfulness exercise Employed acceptance and commitment interventions to emphasize mindfulness and acceptance without struggle  DSM-5 Diagnosis(es): 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 [name] demonstrated progress in her goal as evidenced by increased awareness of hunger patterns and increased awareness of triggers for emotional eating. [name] also continues to demonstrate willingness to engage in learned skill(s).  Plan: As previously planned, today was [name]'s last appointment with this provider. She acknowledged understanding that she may request a follow-up appointment with this provider in the  future as long as she is still established with the clinic. No further follow-up planned by this provider.

## 2019-08-06 NOTE — Telephone Encounter (Signed)
I called and left a message on her machine and sent her a my chart message to schedule the appt for next week.

## 2019-08-06 NOTE — Telephone Encounter (Signed)
Please advise. Thanks.  

## 2019-08-06 NOTE — Telephone Encounter (Signed)
Hey call her please and see if she can come in next week (change her appointment to next week) to fully discuss the GLP-1 medication

## 2019-08-11 ENCOUNTER — Encounter (INDEPENDENT_AMBULATORY_CARE_PROVIDER_SITE_OTHER): Payer: Self-pay | Admitting: Physician Assistant

## 2019-08-11 ENCOUNTER — Other Ambulatory Visit: Payer: Self-pay

## 2019-08-11 ENCOUNTER — Ambulatory Visit (INDEPENDENT_AMBULATORY_CARE_PROVIDER_SITE_OTHER): Payer: Medicare Other | Admitting: Physician Assistant

## 2019-08-11 VITALS — BP 111/68 | HR 75 | Temp 97.8°F | Ht 64.0 in | Wt 219.0 lb

## 2019-08-11 DIAGNOSIS — R7303 Prediabetes: Secondary | ICD-10-CM

## 2019-08-11 DIAGNOSIS — G4733 Obstructive sleep apnea (adult) (pediatric): Secondary | ICD-10-CM | POA: Diagnosis not present

## 2019-08-11 DIAGNOSIS — Z6837 Body mass index (BMI) 37.0-37.9, adult: Secondary | ICD-10-CM | POA: Diagnosis not present

## 2019-08-11 DIAGNOSIS — E66812 Obesity, class 2: Secondary | ICD-10-CM

## 2019-08-11 MED ORDER — VICTOZA 18 MG/3ML ~~LOC~~ SOPN: 1.8000 mg | PEN_INJECTOR | Freq: Every day | SUBCUTANEOUS | 0 refills | Status: DC

## 2019-08-11 NOTE — Progress Notes (Signed)
Chief Complaint:   OBESITY Crystal Jackson is here to discuss her progress with her obesity treatment plan along with follow-up of her obesity related diagnoses. Crystal Jackson is on the Category 2 Plan and states she is following her eating plan approximately 50% of the time. [name] states she is walking/PT 20 minutes 3 times per week.  Today's visit was #: 24 Starting weight: 242 lbs Starting date: 07/09/2018 Today's weight: 219 lbs Today's date: 08/11/2019 Total lbs lost to date: 23 Total lbs lost since last in-office visit: 0  Interim History: Markelle reports that she has "no willpower." She does well on plan for a while and then "falls off of the wagon."  Subjective:   Prediabetes. Crystal Jackson has a diagnosis of prediabetes based on her elevated HgA1c and was informed this puts her at greater risk of developing diabetes. She continues to work on diet and exercise to decrease her risk of diabetes. She denies nausea or hypoglycemia. Crystal Jackson is on no medication. She reports polyphagia.  Lab Results  Component Value Date   HGBA1C 5.3 05/27/2019   Lab Results  Component Value Date   INSULIN 16.2 05/27/2019   INSULIN 33.0 (H) 11/10/2018   INSULIN 25.0 (H) 07/09/2018   OSA (obstructive sleep apnea). Crystal Jackson is using CPAP nightly as prescribed.  Assessment/Plan:   Prediabetes. [name] will continue to work on weight loss, exercise, and decreasing simple carbohydrates to help decrease the risk of diabetes. She will start liraglutide (VICTOZA) 18 MG/3ML SOPN 0.6 mg daily #3 pens.  OSA (obstructive sleep apnea). Intensive lifestyle modifications are the first line treatment for this issue. We discussed several lifestyle modifications today and she will continue to work on diet, exercise and weight loss efforts. We will continue to monitor. Orders and follow up as documented in patient record. Crystal Jackson will continue with CPAP usage nightly.  Counseling  Sleep apnea is a condition in which  breathing pauses or becomes shallow during sleep. This happens over and over during the night. This disrupts your sleep and keeps your body from getting the rest that it needs, which can cause tiredness and lack of energy (fatigue) during the day.  Sleep apnea treatment: If you were given a device to open your airway while you sleep, USE IT!  Sleep hygiene:   Limit or avoid alcohol, caffeinated beverages, and cigarettes, especially close to bedtime.   Do not eat a large meal or eat spicy foods right before bedtime. This can lead to digestive discomfort that can make it hard for you to sleep.  Keep a sleep diary to help you and your health care provider figure out what could be causing your insomnia.  . Make your bedroom a dark, comfortable place where it is easy to fall asleep. ? Put up shades or blackout curtains to block light from outside. ? Use a white noise machine to block noise. ? Keep the temperature cool. . Limit screen use before bedtime. This includes: ? Watching TV. ? Using your smartphone, tablet, or computer. . Stick to a routine that includes going to bed and waking up at the same times every day and night. This can help you fall asleep faster. Consider making a quiet activity, such as reading, part of your nighttime routine. . Try to avoid taking naps during the day so that you sleep better at night. . Get out of bed if you are still awake after 15 minutes of trying to sleep. Keep the lights down, but try reading or  doing a quiet activity. When you feel sleepy, go back to bed.  Class 2 severe obesity with serious comorbidity and body mass index (BMI) of 37.0 to 37.9 in adult, unspecified obesity type (HCC).  Crystal Jackson is currently in the action stage of change. As such, her goal is to continue with weight loss efforts. She has agreed to keeping a food journal and adhering to recommended goals of 1200 calories and 85 grams of protein daily.   Exercise goals: Older adults should  follow the adult guidelines. When older adults cannot meet the adult guidelines, they should be as physically active as their abilities and conditions will allow.   Behavioral modification strategies: meal planning and cooking strategies and keeping healthy foods in the home.  Crystal Jackson has agreed to follow-up with our clinic in 2 weeks. She was informed of the importance of frequent follow-up visits to maximize her success with intensive lifestyle modifications for her multiple health conditions.   Objective:   Blood pressure 111/68, pulse 75, temperature 97.8 F (36.6 C), temperature source Oral, height 5\' 4"  (1.626 m), weight 219 lb (99.3 kg), SpO2 97 %. Body mass index is 37.59 kg/m.  General: Cooperative, alert, well developed, in no acute distress. HEENT: Conjunctivae and lids unremarkable. Cardiovascular: Regular rhythm.  Lungs: Normal work of breathing. Neurologic: No focal deficits.   Lab Results  Component Value Date   CREATININE 1.00 05/27/2019   BUN 25 05/27/2019   NA 138 05/27/2019   K 3.9 05/27/2019   CL 96 05/27/2019   CO2 27 05/27/2019   Lab Results  Component Value Date   ALT 23 05/27/2019   AST 26 05/27/2019   ALKPHOS 51 05/27/2019   BILITOT 0.3 05/27/2019   Lab Results  Component Value Date   HGBA1C 5.3 05/27/2019   HGBA1C 5.4 11/10/2018   HGBA1C 5.8 (H) 07/09/2018   Lab Results  Component Value Date   INSULIN 16.2 05/27/2019   INSULIN 33.0 (H) 11/10/2018   INSULIN 25.0 (H) 07/09/2018   Lab Results  Component Value Date   TSH 2.210 07/09/2018   Lab Results  Component Value Date   CHOL 141 05/27/2019   HDL 55 05/27/2019   LDLCALC 69 05/27/2019   TRIG 88 05/27/2019   Lab Results  Component Value Date   WBC 6.0 07/09/2018   HGB 13.6 07/09/2018   HCT 39.7 07/09/2018   MCV 89 07/09/2018   PLT 241 07/09/2018   No results found for: IRON, TIBC, FERRITIN  Obesity Behavioral Intervention Documentation for Insurance:   Approximately 15  minutes were spent on the discussion below.  ASK: We discussed the diagnosis of obesity with Crystal Jackson today and Crystal Jackson agreed to give Crystal Jackson permission to discuss obesity behavioral modification therapy today.  ASSESS: [name] has the diagnosis of obesity and her BMI today is 37.6. Kindel is in the action stage of change.   ADVISE: Mega was educated on the multiple health risks of obesity as well as the benefit of weight loss to improve her health. She was advised of the need for long term treatment and the importance of lifestyle modifications to improve her current health and to decrease her risk of future health problems.  AGREE: Multiple dietary modification options and treatment options were discussed and Elliott agreed to follow the recommendations documented in the above note.  ARRANGE: Nalina was educated on the importance of frequent visits to treat obesity as outlined per CMS and USPSTF guidelines and agreed to schedule her next follow up appointment  today.  Attestation Statements:   Reviewed by clinician on day of visit: allergies, medications, problem list, medical history, surgical history, family history, social history, and previous encounter notes.  IMichaelene Song, am acting as transcriptionist for Abby Potash, PA-C   I have reviewed the above documentation for accuracy and completeness, and I agree with the above. Abby Potash, PA-C

## 2019-08-12 ENCOUNTER — Encounter: Payer: Self-pay | Admitting: Physical Therapy

## 2019-08-12 ENCOUNTER — Ambulatory Visit: Payer: Medicare Other | Attending: Physician Assistant | Admitting: Physical Therapy

## 2019-08-12 DIAGNOSIS — R2681 Unsteadiness on feet: Secondary | ICD-10-CM | POA: Diagnosis present

## 2019-08-12 DIAGNOSIS — M25561 Pain in right knee: Secondary | ICD-10-CM | POA: Diagnosis present

## 2019-08-12 DIAGNOSIS — G8929 Other chronic pain: Secondary | ICD-10-CM | POA: Diagnosis present

## 2019-08-12 DIAGNOSIS — M6281 Muscle weakness (generalized): Secondary | ICD-10-CM | POA: Diagnosis present

## 2019-08-12 DIAGNOSIS — M545 Low back pain, unspecified: Secondary | ICD-10-CM

## 2019-08-12 DIAGNOSIS — M25562 Pain in left knee: Secondary | ICD-10-CM | POA: Insufficient documentation

## 2019-08-12 DIAGNOSIS — R262 Difficulty in walking, not elsewhere classified: Secondary | ICD-10-CM | POA: Diagnosis present

## 2019-08-12 NOTE — Therapy (Signed)
[facility] Outpatient Rehabilitation [facility] [address]  [address] [place], [place], [address] Phone: [phone]   Fax:  [phone]  Physical Therapy Treatment  Patient Details  Name: Crystal Jackson MRN: [id number] Date of Birth: 06-Mar-1945 Referring Provider (PT): Richardean Canal, MD   Encounter Date: 08/12/2019   PT End of Session - 08/12/19 1456    Visit Number 4    Number of Visits 9    Date for PT Re-Evaluation 09/08/19    Authorization Type [employer] Medicare    PT Start Time 1400    PT Stop Time 1442    PT Time Calculation (min) 42 min    Activity Tolerance Patient tolerated treatment well    Behavior During Therapy [facility] for tasks assessed/performed           Past Medical History:  Diagnosis Date  . Anxiety   . Arthritis   . Asthma   . Back pain   . Bilateral swelling of feet   . Constipation   . Depression   . Gallbladder problem   . GERD (gastroesophageal reflux disease)   . History of low potassium   . Hypertension   . Joint pain   . Obesity   . Palpitations   . Shortness of breath   . Sleep apnea     Past Surgical History:  Procedure Laterality Date  . ABDOMINAL HYSTERECTOMY    . APPENDECTOMY    . CHOLECYSTECTOMY    . KNEE SURGERY Left   . SHOULDER SURGERY Right     There were no vitals filed for this visit.   Subjective Assessment - 08/12/19 1402    Subjective Has been feeling "wimpy." L inferior knee has been acting up since the weekend and hasn't had much energy d/t the heat.    Pertinent History SOB, palpitations, HTN, GERD, depression, B feet swelling, back pain, asthma, anxiety, R shoulder surgery, L knee surgery    Diagnostic tests none recent    Patient Stated Goals get rid of pain    Currently in Pain? Yes    Pain Score 3    Pain Location Knee    Pain Orientation Left    Pain Descriptors / Indicators Discomfort    Pain Type Chronic pain                             [facility] Adult PT  Treatment/Exercise - 08/12/19 0001      Lumbar Exercises: Stretches   Hip Flexor Stretch Left;2 reps;30 seconds    Hip Flexor Stretch Limitations mod thomas with strap    Quad Stretch Left;2 reps;30 seconds    Quad Stretch Limitations prone with strap      Lumbar Exercises: Aerobic   Nustep Lvl 4, 6 min (UE/LE)      Lumbar Exercises: Seated   Other Seated Lumbar Exercises prayer stretch 5x5" forward, 5x5" to R and L diagonals with orange pball      Lumbar Exercises: Supine   Dead Bug 10 reps    Dead Bug Limitations orange pball on belly    Bridge with [employer] 10 reps    Bridge with [employer] Limitations cues to avoid overactivation of HS    Large Ball Abdominal Isometric 10 reps    Large Ball Abdominal Isometric Limitations 10x5" with orange pball on belly   cues to avoid valsalva     Kinesiotix   Create Space L knee chondromalacia  taping pattern                   PT Education - 08/12/19 1450    Education Details update to HEP; edu on energy conservation with yardwork and gardening    Person(s) Educated Patient    Methods Explanation;Demonstration;Tactile cues;Verbal cues;Handout    Comprehension Verbalized understanding;Returned demonstration            PT Short Term Goals - 07/22/19 1414      PT SHORT TERM GOAL #1   Title Patient to be independent with initial HEP.    Time 3    Period Weeks    Status On-going    Target Date 08/04/19             PT Long Term Goals - 07/22/19 1414      PT LONG TERM GOAL #1   Title Patient to be independent with advanced HEP.    Time 8    Period Weeks    Status On-going      PT LONG TERM GOAL #2   Title Patient to report 70% improvement in B knee and LBP.    Time 8    Period Weeks    Status On-going      PT LONG TERM GOAL #3   Title Patient to demonstrate B LE strength >=4+/5.    Time 8    Period Weeks    Status On-going      PT LONG TERM GOAL #4   Title Patient to demonstrate lumbar AROM WFL and  without pain limiting.    Time 8    Period Weeks    Status On-going      PT LONG TERM GOAL #5   Title Patient to score >21/24 on DGI in order to decrease risk of falls.    Time 8    Period Weeks    Status On-going                 Plan - 08/12/19 1458    Clinical Impression Statement Patient reporting L inferior knee pain since the weekend. Reports that last weekend she did a lot of yardwork and feels that this has also contributed to some increased back pain. Noted good benefit from KT tape applied last session and requesting application today. Patient pinpointing pain to L patellar tendon, thus worked on quad stretching for hopeful pressure relief. Patient noted most benefit from hip flexor stretch, thus updated this into HEP. Patient tolerated core strengthening mat ther-ex with cueing for proper form to avoid HS cramping with report of improved tolerance after cueing. Patient tolerated all ther-ex without c/o pain. Ended session with KT tape to L knee per patient's request. No complaints at end of session.    Comorbidities SOB, palpitations, HTN, GERD, depression, B feet swelling, back pain, asthma, anxiety, R shoulder surgery, L knee surgery    PT Treatment/Interventions ADLs/Self Care Home Management;Cryotherapy;Canalith Repostioning;Electrical Stimulation;Moist Heat;Traction;Balance training;Therapeutic exercise;Therapeutic activities;Functional mobility training;Stair training;Gait training;Ultrasound;Neuromuscular re-education;Patient/family education;Manual techniques;Vestibular;Vasopneumatic Device;Taping;Energy conservation;Dry needling;Passive range of motion;Iontophoresis 4mg /ml Dexamethasone    PT Next Visit Plan balance exercises, progress quad strength and lumbopelvic ROM    Consulted and Agree with Plan of Care Patient           Patient will benefit from skilled therapeutic intervention in order to improve the following deficits and impairments:  Abnormal gait,  Hypomobility, Increased edema, Decreased activity tolerance, Decreased strength, Increased fascial restricitons, Pain, Decreased balance, Difficulty walking, Increased muscle spasms, Improper  body mechanics, Decreased range of motion, Postural dysfunction, Impaired flexibility  Visit Diagnosis: Chronic bilateral low back pain without sciatica  Chronic pain of left knee  Chronic pain of right knee  Difficulty in walking, not elsewhere classified  Unsteadiness on feet  Muscle weakness (generalized)     Problem List Patient Active Problem List   Diagnosis Date Noted  . OSA (obstructive sleep apnea) 09/03/2018  . Mild persistent asthma without complication 09/03/2018  . Severe obesity (BMI >= 40) (HCC) 04/14/2018  . Unilateral primary osteoarthritis, left knee 04/14/2018    Anette Guarneri, PT, DPT 08/12/19 3:03 PM   [facility] [address]  [address] [place], [place], [address] Phone: [phone]   Fax:  [phone]  Name: Crystal Jackson MRN: [id number] Date of Birth: October 21, 1945

## 2019-08-17 ENCOUNTER — Encounter (INDEPENDENT_AMBULATORY_CARE_PROVIDER_SITE_OTHER): Payer: Self-pay | Admitting: Physician Assistant

## 2019-08-18 ENCOUNTER — Other Ambulatory Visit (INDEPENDENT_AMBULATORY_CARE_PROVIDER_SITE_OTHER): Payer: Self-pay | Admitting: *Deleted

## 2019-08-18 MED ORDER — INSULIN PEN NEEDLE 32G X 4 MM MISC
0 refills | Status: DC
Start: 2019-08-18 — End: 2019-12-01

## 2019-08-19 ENCOUNTER — Other Ambulatory Visit: Payer: Self-pay

## 2019-08-19 ENCOUNTER — Ambulatory Visit (INDEPENDENT_AMBULATORY_CARE_PROVIDER_SITE_OTHER): Payer: Medicare Other | Admitting: Physician Assistant

## 2019-08-19 ENCOUNTER — Ambulatory Visit: Payer: Medicare Other

## 2019-08-19 DIAGNOSIS — M545 Low back pain: Secondary | ICD-10-CM | POA: Diagnosis not present

## 2019-08-19 DIAGNOSIS — R262 Difficulty in walking, not elsewhere classified: Secondary | ICD-10-CM

## 2019-08-19 DIAGNOSIS — M6281 Muscle weakness (generalized): Secondary | ICD-10-CM

## 2019-08-19 DIAGNOSIS — R2681 Unsteadiness on feet: Secondary | ICD-10-CM

## 2019-08-19 DIAGNOSIS — G8929 Other chronic pain: Secondary | ICD-10-CM

## 2019-08-19 NOTE — Therapy (Signed)
[facility] Outpatient Rehabilitation [facility] [address]  [address] [place], [place], [address] Phone: [phone]   Fax:  [phone]  Physical Therapy Treatment  Patient Details  Name: Crystal Jackson MRN: [id number] Date of Birth: 02-06-1945 Referring Provider (PT): Richardean Canal, MD   Encounter Date: 08/19/2019   PT End of Session - 08/19/19 1413    Visit Number 5    Number of Visits 9    Date for PT Re-Evaluation 09/08/19    Authorization Type UHC Medicare    PT Start Time 1401    PT Stop Time 1441    PT Time Calculation (min) 40 min    Activity Tolerance Patient tolerated treatment well    Behavior During Therapy [facility] for tasks assessed/performed           Past Medical History:  Diagnosis Date  . Anxiety   . Arthritis   . Asthma   . Back pain   . Bilateral swelling of feet   . Constipation   . Depression   . Gallbladder problem   . GERD (gastroesophageal reflux disease)   . History of low potassium   . Hypertension   . Joint pain   . Obesity   . Palpitations   . Shortness of breath   . Sleep apnea     Past Surgical History:  Procedure Laterality Date  . ABDOMINAL HYSTERECTOMY    . APPENDECTOMY    . CHOLECYSTECTOMY    . KNEE SURGERY Left   . SHOULDER SURGERY Right     There were no vitals filed for this visit.   Subjective Assessment - 08/19/19 1402    Subjective Pt. noting my knees hurt.    Pertinent History SOB, palpitations, HTN, GERD, depression, B feet swelling, back pain, asthma, anxiety, R shoulder surgery, L knee surgery    Diagnostic tests none recent    Patient Stated Goals get rid of pain    Currently in Pain? Yes    Pain Score 4     Pain Location Knee    Pain Orientation Right;Left    Pain Descriptors / Indicators Pressure    Pain Type Chronic pain    Multiple Pain Sites No                             [facility] Adult PT Treatment/Exercise - 08/19/19 0001      Lumbar Exercises: Stretches    Lower Trunk Rotation Limitations 5" x 10 reps       Lumbar Exercises: Aerobic   Nustep Lvl 4, 6 min (UE/LE)      Lumbar Exercises: Supine   Bridge 15 reps;3 seconds    Straight Leg Raise 10 reps;3 seconds    Straight Leg Raises Limitations cues for quad set      Lumbar Exercises: Quadruped   Madcat/Old Horse 10 reps      Knee/Hip Exercises: Standing   Hip Flexion Right;Left;10 reps;Knee bent;Stengthening    Hip Flexion Limitations machine support     Hip Abduction Right;Left;10 reps;Knee straight;Stengthening    Abduction Limitations machine bolster support    Hip Extension Left;Right;10 reps;Knee straight;Stengthening    Extension Limitations machine bolster support    Forward Step Up Left;Right;10 reps    Forward Step Up Limitations 6" step at TM rail                   PT Education - 08/19/19 1440  Education Details HEP update; counter squat    Person(s) Educated Patient    Methods Explanation;Demonstration;Verbal cues;Handout    Comprehension Verbalized understanding;Returned demonstration;Verbal cues required            PT Short Term Goals - 08/19/19 1413      PT SHORT TERM GOAL #1   Title Patient to be independent with initial HEP.    Time 3    Period Weeks    Status Achieved    Target Date 08/04/19             PT Long Term Goals - 07/22/19 1414      PT LONG TERM GOAL #1   Title Patient to be independent with advanced HEP.    Time 8    Period Weeks    Status On-going      PT LONG TERM GOAL #2   Title Patient to report 70% improvement in B knee and LBP.    Time 8    Period Weeks    Status On-going      PT LONG TERM GOAL #3   Title Patient to demonstrate B LE strength >=4+/5.    Time 8    Period Weeks    Status On-going      PT LONG TERM GOAL #4   Title Patient to demonstrate lumbar AROM WFL and without pain limiting.    Time 8    Period Weeks    Status On-going      PT LONG TERM GOAL #5   Title Patient to score >21/24 on DGI in  order to decrease risk of falls.    Time 8    Period Weeks    Status On-going                 Plan - 08/19/19 1414    Clinical Impression Statement Pt. reporting she understands initial HEP and is tolerating daily performance well.  STG #1.  Was able to progress proximal hip strengthening and lumbopelvic ROM activities without issue today.  Pt. noting LE fatigue however no increased pain.  Does feel that her knee pain is improving since starting therapy however noting recent increase in LBP without known trigger.  Will continue to work toward improving lumbopelvic strength and mobility.    Comorbidities SOB, palpitations, HTN, GERD, depression, B feet swelling, back pain, asthma, anxiety, R shoulder surgery, L knee surgery    Rehab Potential Good    PT Frequency 1x / week    PT Duration 8 weeks    PT Treatment/Interventions ADLs/Self Care Home Management;Cryotherapy;Canalith Repostioning;Electrical Stimulation;Moist Heat;Traction;Balance training;Therapeutic exercise;Therapeutic activities;Functional mobility training;Stair training;Gait training;Ultrasound;Neuromuscular re-education;Patient/family education;Manual techniques;Vestibular;Vasopneumatic Device;Taping;Energy conservation;Dry needling;Passive range of motion;Iontophoresis 4mg /ml Dexamethasone    PT Next Visit Plan balance exercises, progress quad strength and lumbopelvic ROM    Consulted and Agree with Plan of Care Patient           Patient will benefit from skilled therapeutic intervention in order to improve the following deficits and impairments:  Abnormal gait, Hypomobility, Increased edema, Decreased activity tolerance, Decreased strength, Increased fascial restricitons, Pain, Decreased balance, Difficulty walking, Increased muscle spasms, Improper body mechanics, Decreased range of motion, Postural dysfunction, Impaired flexibility  Visit Diagnosis: Chronic bilateral low back pain without sciatica  Chronic pain of  left knee  Chronic pain of right knee  Difficulty in walking, not elsewhere classified  Unsteadiness on feet  Muscle weakness (generalized)     Problem List Patient Active Problem List   Diagnosis Date  Noted  . OSA (obstructive sleep apnea) 09/03/2018  . Mild persistent asthma without complication 09/03/2018  . Severe obesity (BMI >= 40) (HCC) 04/14/2018  . Unilateral primary osteoarthritis, left knee 04/14/2018    Kermit Balo, PTA 08/19/19 2:46 PM   [facility] Out[facility] [address]  [address] [place], [place], [address] Phone: [phone]   Fax:  [phone]  Name: Cheronda Erck MRN: [id number] Date of Birth: [birth year]

## 2019-08-20 ENCOUNTER — Telehealth (INDEPENDENT_AMBULATORY_CARE_PROVIDER_SITE_OTHER): Payer: Medicare Other | Admitting: Psychology

## 2019-08-20 DIAGNOSIS — F3341 Major depressive disorder, recurrent, in partial remission: Secondary | ICD-10-CM

## 2019-08-24 ENCOUNTER — Encounter (INDEPENDENT_AMBULATORY_CARE_PROVIDER_SITE_OTHER): Payer: Self-pay | Admitting: Physician Assistant

## 2019-08-24 NOTE — Telephone Encounter (Signed)
Please advise. Thank you

## 2019-08-26 ENCOUNTER — Ambulatory Visit: Payer: Medicare Other | Admitting: Physical Therapy

## 2019-08-27 ENCOUNTER — Encounter (INDEPENDENT_AMBULATORY_CARE_PROVIDER_SITE_OTHER): Payer: Self-pay | Admitting: Physician Assistant

## 2019-08-27 ENCOUNTER — Other Ambulatory Visit: Payer: Self-pay

## 2019-08-27 ENCOUNTER — Ambulatory Visit (INDEPENDENT_AMBULATORY_CARE_PROVIDER_SITE_OTHER): Payer: Medicare Other | Admitting: Physician Assistant

## 2019-08-27 VITALS — BP 118/74 | HR 72 | Temp 97.6°F | Ht 64.0 in | Wt 219.0 lb

## 2019-08-27 DIAGNOSIS — Z6837 Body mass index (BMI) 37.0-37.9, adult: Secondary | ICD-10-CM | POA: Diagnosis not present

## 2019-08-27 DIAGNOSIS — R7303 Prediabetes: Secondary | ICD-10-CM

## 2019-08-27 DIAGNOSIS — K2100 Gastro-esophageal reflux disease with esophagitis, without bleeding: Secondary | ICD-10-CM | POA: Diagnosis not present

## 2019-08-27 NOTE — Progress Notes (Signed)
Chief Complaint:   OBESITY Crystal Jackson is here to discuss her progress with her obesity treatment plan along with follow-up of her obesity related diagnoses. Crystal Jackson is on the Category 2 Plan and journaling and states she is following her eating plan approximately 50% of the time. Crystal Jackson states she is doing PT 20--30 minutes 3-4 times per week.  Today's visit was #: 25 Starting weight: 242 lbs Starting date: 07/09/2018 Today's weight: 219 lbs Today's date: 08/27/2019 Total lbs lost to date: 23 Total lbs lost since last in-office visit: 0  Interim History: Crystal Jackson has been taking her Victoza for 3 days (though she missed 3 doses in between) and she notes nausea and one episode of vomiting, however, she wants to continue taking it.  Subjective:   Gastroesophageal reflux disease with esophagitis, unspecified whether hemorrhage. Crystal Jackson is on Protonix twice daily from her PCP and denies current symptoms. No hematochezia or black stools.  Prediabetes. Crystal Jackson has a diagnosis of prediabetes based on her elevated HgA1c and was informed this puts her at greater risk of developing diabetes. She continues to work on diet and exercise to decrease her risk of diabetes. She denies nausea or hypoglycemia. Crystal Jackson is on Victoza and reports nausea with one episode of vomiting.   Lab Results  Component Value Date   HGBA1C 5.3 05/27/2019   Lab Results  Component Value Date   INSULIN 16.2 05/27/2019   INSULIN 33.0 (H) 11/10/2018   INSULIN 25.0 (H) 07/09/2018   Assessment/Plan:   Gastroesophageal reflux disease with esophagitis, unspecified whether hemorrhage. Intensive lifestyle modifications are the first line treatment for this issue. We discussed several lifestyle modifications today and she will continue to work on diet, exercise and weight loss efforts. Orders and follow up as documented in patient record. Crystal Jackson will continue her medication as directed and continue follow-up with her PCP  as scheduled.  Counseling . If a person has gastroesophageal reflux disease (GERD), food and stomach acid move back up into the esophagus and cause symptoms or problems such as damage to the esophagus. . Anti-reflux measures include: raising the head of the bed, avoiding tight clothing or belts, avoiding eating late at night, not lying down shortly after mealtime, and achieving weight loss. . Avoid ASA, NSAID's, caffeine, alcohol, and tobacco.  . OTC Pepcid and/or Tums are often very helpful for as needed use.  Marland Kitchen However, for persisting chronic or daily symptoms, stronger medications like Omeprazole may be needed. . You may need to avoid foods and drinks such as: ? Coffee and tea (with or without caffeine). ? Drinks that contain alcohol. ? Energy drinks and sports drinks. ? Bubbly (carbonated) drinks or sodas. ? Chocolate and cocoa. ? Peppermint and mint flavorings. ? Garlic and onions. ? Horseradish. ? Spicy and acidic foods. These include peppers, chili powder, curry powder, vinegar, hot sauces, and BBQ sauce. ? Citrus fruit juices and citrus fruits, such as oranges, lemons, and limes. ? Tomato-based foods. These include red sauce, chili, salsa, and pizza with red sauce. ? Fried and fatty foods. These include donuts, french fries, potato chips, and high-fat dressings. ? High-fat meats. These include hot dogs, rib eye steak, sausage, ham, and bacon.  Prediabetes. Crystal Jackson will continue to work on weight loss, exercise, and decreasing simple carbohydrates to help decrease the risk of diabetes. She will decrease her dose of Victoza to 0.3 mg daily with breakfast and will increase dose as tolerated.  Class 2 severe obesity with serious comorbidity and body  mass index (BMI) of 37.0 to 37.9 in adult, unspecified obesity type (HCC).  Crystal Jackson is currently in the action stage of change. As such, her goal is to continue with weight loss efforts. She has agreed to the Category 2 Plan and will  journal 1200 calories and 85 grams of protein daily.   Exercise goals: Older adults should follow the adult guidelines. When older adults cannot meet the adult guidelines, they should be as physically active as their abilities and conditions will allow.   Behavioral modification strategies: meal planning and cooking strategies and keeping healthy foods in the home.  Crystal Jackson has agreed to follow-up with our clinic in 2-3 weeks. She was informed of the importance of frequent follow-up visits to maximize her success with intensive lifestyle modifications for her multiple health conditions.   Objective:   Blood pressure 118/74, pulse 72, temperature 97.6 F (36.4 C), temperature source Oral, height 5\' 4"  (1.626 m), weight 219 lb (99.3 kg), SpO2 97 %. Body mass index is 37.59 kg/m.  General: Cooperative, alert, well developed, in no acute distress. HEENT: Conjunctivae and lids unremarkable. Cardiovascular: Regular rhythm.  Lungs: Normal work of breathing. Neurologic: No focal deficits.   Lab Results  Component Value Date   CREATININE 1.00 05/27/2019   BUN 25 05/27/2019   NA 138 05/27/2019   K 3.9 05/27/2019   CL 96 05/27/2019   CO2 27 05/27/2019   Lab Results  Component Value Date   ALT 23 05/27/2019   AST 26 05/27/2019   ALKPHOS 51 05/27/2019   BILITOT 0.3 05/27/2019   Lab Results  Component Value Date   HGBA1C 5.3 05/27/2019   HGBA1C 5.4 11/10/2018   HGBA1C 5.8 (H) 07/09/2018   Lab Results  Component Value Date   INSULIN 16.2 05/27/2019   INSULIN 33.0 (H) 11/10/2018   INSULIN 25.0 (H) 07/09/2018   Lab Results  Component Value Date   TSH 2.210 07/09/2018   Lab Results  Component Value Date   CHOL 141 05/27/2019   HDL 55 05/27/2019   LDLCALC 69 05/27/2019   TRIG 88 05/27/2019   Lab Results  Component Value Date   WBC 6.0 07/09/2018   HGB 13.6 07/09/2018   HCT 39.7 07/09/2018   MCV 89 07/09/2018   PLT 241 07/09/2018   No results found for: IRON, TIBC,  FERRITIN  Obesity Behavioral Intervention Documentation for Insurance:   Approximately 15 minutes were spent on the discussion below.  ASK: We discussed the diagnosis of obesity with Crystal Jackson today and Crystal Jackson agreed to give Crystal Jackson permission to discuss obesity behavioral modification therapy today.  ASSESS: Crystal Jackson has the diagnosis of obesity and her BMI today is 37.6. Crystal Jackson is in the action stage of change.   ADVISE: Crystal Jackson was educated on the multiple health risks of obesity as well as the benefit of weight loss to improve her health. She was advised of the need for long term treatment and the importance of lifestyle modifications to improve her current health and to decrease her risk of future health problems.  AGREE: Multiple dietary modification options and treatment options were discussed and Crystal Jackson agreed to follow the recommendations documented in the above note.  ARRANGE: Crystal Jackson was educated on the importance of frequent visits to treat obesity as outlined per CMS and USPSTF guidelines and agreed to schedule her next follow up appointment today.  Attestation Statements:   Reviewed by clinician on day of visit: allergies, medications, problem list, medical history, surgical history, family history, social history,  and previous encounter notes.  IMichaelene Song, am acting as transcriptionist for Abby Potash, PA-C   I have reviewed the above documentation for accuracy and completeness, and I agree with the above. Abby Potash, PA-C

## 2019-08-31 ENCOUNTER — Encounter: Payer: Self-pay | Admitting: Physical Therapy

## 2019-08-31 ENCOUNTER — Other Ambulatory Visit: Payer: Self-pay

## 2019-08-31 ENCOUNTER — Ambulatory Visit: Payer: Medicare Other | Admitting: Physical Therapy

## 2019-08-31 DIAGNOSIS — M25562 Pain in left knee: Secondary | ICD-10-CM

## 2019-08-31 DIAGNOSIS — R262 Difficulty in walking, not elsewhere classified: Secondary | ICD-10-CM

## 2019-08-31 DIAGNOSIS — M545 Low back pain: Secondary | ICD-10-CM | POA: Diagnosis not present

## 2019-08-31 DIAGNOSIS — M6281 Muscle weakness (generalized): Secondary | ICD-10-CM

## 2019-08-31 DIAGNOSIS — G8929 Other chronic pain: Secondary | ICD-10-CM

## 2019-08-31 DIAGNOSIS — R2681 Unsteadiness on feet: Secondary | ICD-10-CM

## 2019-08-31 NOTE — Therapy (Addendum)
[facility] [address]  [place], [place], [address] Phone: [phone]   Fax:  [phone]  Physical Therapy Treatment  Patient Details  Name: Crystal Jackson MRN: [id number] Date of Birth: Dec 02, 1945 Referring Provider (PT): Erskine Emery, MD   Progress Note Reporting Period 07/14/19 to 08/31/19  See note below for Objective Data and Assessment of Progress/Goals.     Encounter Date: 08/31/2019   PT End of Session - 08/31/19 1707    Visit Number 6    Number of Visits 9    Date for PT Re-Evaluation 09/08/19    Authorization Type [employer] Medicare    PT Start Time 6283    PT Stop Time 1533    PT Time Calculation (min) 39 min    Equipment Utilized During Treatment Gait belt    Activity Tolerance Patient tolerated treatment well    Behavior During Therapy WFL for tasks assessed/performed           Past Medical History:  Diagnosis Date  . Anxiety   . Arthritis   . Asthma   . Back pain   . Bilateral swelling of feet   . Constipation   . Depression   . Gallbladder problem   . GERD (gastroesophageal reflux disease)   . History of low potassium   . Hypertension   . Joint pain   . Obesity   . Palpitations   . Shortness of breath   . Sleep apnea     Past Surgical History:  Procedure Laterality Date  . ABDOMINAL HYSTERECTOMY    . APPENDECTOMY    . CHOLECYSTECTOMY    . KNEE SURGERY Left   . SHOULDER SURGERY Right     There were no vitals filed for this visit.   Subjective Assessment - 08/31/19 1457    Subjective Started taking a new medication that made her vomit last week. Feeling better now. Feels like she is doing well at home, although some days she is "lazy." Feels like she ready to wrap up with PT.    Pertinent History SOB, palpitations, HTN, GERD, depression, B feet swelling, back pain, asthma, anxiety, R shoulder surgery, L knee surgery    Diagnostic tests none recent    Patient Stated Goals  get rid of pain    Currently in Pain? No/denies              [facility] PT Assessment - 08/31/19 0001      AROM   Lumbar Flexion toes    Lumbar Extension moderately limited    Lumbar - Right Side Bend mid shin    Lumbar - Left Side Bend mid shin    Lumbar - Right Rotation WNL    Lumbar - Left Rotation WNL      Strength   Right Hip Flexion 4+/5    Right Hip ABduction 4+/5    Right Hip ADduction 4+/5    Left Hip Flexion 4+/5    Left Hip ABduction 4+/5    Left Hip ADduction 4+/5    Right Knee Flexion 4+/5    Right Knee Extension 4+/5    Left Knee Flexion 4+/5    Left Knee Extension 4+/5    Right Ankle Dorsiflexion 4+/5    Right Ankle Plantar Flexion 4+/5    Left Ankle Dorsiflexion 4/5    Left Ankle Plantar Flexion 4+/5      Dynamic Gait Index   Level Surface Normal    Change in Gait  Speed Normal    Gait with Horizontal Head Turns Normal    Gait with Vertical Head Turns Normal    Gait and Pivot Turn Normal    Step Over Obstacle Mild Impairment    Step Around Obstacles Mild Impairment    Steps Moderate Impairment    Total Score 20                         [facility] Adult PT Treatment/Exercise - 08/31/19 0001      Lumbar Exercises: Stretches   Other Lumbar Stretch Exercise R/L hip flexor stretch at counter 30" each      Lumbar Exercises: Aerobic   Recumbent Bike L1x6 min                  PT Education - 08/31/19 1706    Education Details discussion on objective measurements and remaining impairments; update/consolidation of HEP    Person(s) Educated Patient    Methods Explanation;Demonstration;Tactile cues;Verbal cues;Handout    Comprehension Verbalized understanding;Returned demonstration            PT Short Term Goals - 08/31/19 1500      PT SHORT TERM GOAL #1   Title Patient to be independent with initial HEP.    Time 3    Period Weeks    Status Achieved    Target Date 08/04/19             PT Long Term Goals - 08/31/19 1500        PT LONG TERM GOAL #1   Title Patient to be independent with advanced HEP.    Time 8    Period Weeks    Status Achieved      PT LONG TERM GOAL #2   Title Patient to report 70% improvement in B knee and LBP.    Time 8    Period Weeks    Status Achieved   reports 75-80% improvement     PT LONG TERM GOAL #3   Title Patient to demonstrate B LE strength >=4+/5.    Time 8    Period Weeks    Status Partially Met   L dorsiflexion strength limiting     PT LONG TERM GOAL #4   Title Patient to demonstrate lumbar AROM WFL and without pain limiting.    Time 8    Period Weeks    Status Partially Met   improved in pain levels and in B sidebending AROM     PT LONG TERM GOAL #5   Title Patient to score >21/24 on DGI in order to decrease risk of falls.    Time 8    Period Weeks    Status On-going   08/31/19: 20/24                Plan - 08/31/19 1713    Clinical Impression Statement Patient reporting that she is feeling better since last week, as she was having vomiting d/t a new medication that she was taking. Patient reports that she is doing well with her HEP at home despite having "lazy" days and would like to wrap up with PT at this time. Patient reports 75-80% improvement in her pain. Strength testing has improved considerably, now with L dorsiflexion strength most limiting. Lumbar AROM has improved in pain levels and in B sidebending AROM. Patient has also demonstrated a considerable improvement in dynamic balance, now scoring 20/24 on DGI. Patient's score still falls within the higher  fall risk category. However, d/t patient's compliance with HEP, she will continue to benefit from LE strengthening, mobility, and balance training at home. Updated HEP for max benefit- patient reported understanding. Patient has demonstrated good progress with PT thus far and is now ready to transition to HEP on 30 day hold.    Comorbidities SOB, palpitations, HTN, GERD, depression, B feet swelling, back  pain, asthma, anxiety, R shoulder surgery, L knee surgery    Rehab Potential Good    PT Frequency 1x / week    PT Duration 8 weeks    PT Treatment/Interventions ADLs/Self Care Home Management;Cryotherapy;Canalith Repostioning;Electrical Stimulation;Moist Heat;Traction;Balance training;Therapeutic exercise;Therapeutic activities;Functional mobility training;Stair training;Gait training;Ultrasound;Neuromuscular re-education;Patient/family education;Manual techniques;Vestibular;Vasopneumatic Device;Taping;Energy conservation;Dry needling;Passive range of motion;Iontophoresis 4mg /ml Dexamethasone    PT Next Visit Plan 30 day hold at this time    Consulted and Agree with Plan of Care Patient           Patient will benefit from skilled therapeutic intervention in order to improve the following deficits and impairments:  Abnormal gait, Hypomobility, Increased edema, Decreased activity tolerance, Decreased strength, Increased fascial restricitons, Pain, Decreased balance, Difficulty walking, Increased muscle spasms, Improper body mechanics, Decreased range of motion, Postural dysfunction, Impaired flexibility  Visit Diagnosis: Chronic bilateral low back pain without sciatica  Chronic pain of left knee  Chronic pain of right knee  Difficulty in walking, not elsewhere classified  Unsteadiness on feet  Muscle weakness (generalized)     Problem List Patient Active Problem List   Diagnosis Date Noted  . OSA (obstructive sleep apnea) 09/03/2018  . Mild persistent asthma without complication 93/26/7124  . Severe obesity (BMI >= 40) ([place]) 04/14/2018  . Unilateral primary osteoarthritis, left knee 04/14/2018     Janene Harvey, PT, DPT 08/31/19 5:18 PM   [facility] [address]  [place], [place], [address] Phone: [phone]   Fax:  [phone]  Name: Crystal Jackson MRN: [id number] Date of Birth:  01/22/1945  PHYSICAL THERAPY DISCHARGE SUMMARY  Visits from Start of Care: 6  Current functional level related to goals / functional outcomes: See above clinical impression; patient did not return during 30 day hold   Remaining deficits: Decreased ankle strength, lumbar ROM, and imbalance    Education / Equipment: HEP  Plan: Patient agrees to discharge.  Patient goals were partially met. Patient is being discharged due to being pleased with the current functional level.  ?????     Janene Harvey, PT, DPT 10/20/19 2:59 PM

## 2019-09-03 ENCOUNTER — Encounter (INDEPENDENT_AMBULATORY_CARE_PROVIDER_SITE_OTHER): Payer: Self-pay | Admitting: Physician Assistant

## 2019-09-03 NOTE — Telephone Encounter (Signed)
Please advise 

## 2019-09-17 ENCOUNTER — Ambulatory Visit (INDEPENDENT_AMBULATORY_CARE_PROVIDER_SITE_OTHER): Payer: Medicare Other | Admitting: Physician Assistant

## 2019-09-17 ENCOUNTER — Encounter (INDEPENDENT_AMBULATORY_CARE_PROVIDER_SITE_OTHER): Payer: Self-pay | Admitting: Physician Assistant

## 2019-09-17 ENCOUNTER — Other Ambulatory Visit: Payer: Self-pay

## 2019-09-17 ENCOUNTER — Other Ambulatory Visit (INDEPENDENT_AMBULATORY_CARE_PROVIDER_SITE_OTHER): Payer: Self-pay | Admitting: Physician Assistant

## 2019-09-17 VITALS — BP 119/75 | HR 74 | Temp 97.6°F | Ht 64.0 in | Wt 217.0 lb

## 2019-09-17 DIAGNOSIS — I10 Essential (primary) hypertension: Secondary | ICD-10-CM

## 2019-09-17 DIAGNOSIS — Z6837 Body mass index (BMI) 37.0-37.9, adult: Secondary | ICD-10-CM

## 2019-09-17 DIAGNOSIS — R7303 Prediabetes: Secondary | ICD-10-CM

## 2019-09-17 DIAGNOSIS — E66812 Obesity, class 2: Secondary | ICD-10-CM

## 2019-09-17 MED ORDER — CHLORTHALIDONE 25 MG PO TABS: 12.5000 mg | ORAL_TABLET | Freq: Every day | ORAL | 0 refills | Status: DC

## 2019-09-20 NOTE — Progress Notes (Signed)
Chief Complaint:   OBESITY Crystal Jackson is here to discuss her progress with her obesity treatment plan along with follow-up of her obesity related diagnoses. Crystal Jackson is on the Category 2 Plan and keeping a food journal and adhering to recommended goals of 1200 calories and 85 grams of protein daily and states she is following her eating plan approximately 90% of the time. Crystal Jackson states she is doing physical therapy at home.  Today's visit was #: 26 Starting weight: 242 lbs Starting date: 07/09/2018 Today's weight: 217 lbs Today's date: 09/17/2019 Total lbs lost to date: 25 Total lbs lost since last in-office visit: 2  Interim History: Crystal Jackson states that she is feeling good on Victoza. Her appetite and cravings are controlled.  Subjective:   1. Essential hypertension Crystal Jackson is on chlorthalidone and Cardura. Her blood pressure is controlled today.  2. Pre-diabetes Crystal Jackson is on Victoza, and she is tolerating it well. Her appetite is controlled.  Assessment/Plan:   1. Essential hypertension Crystal Jackson is working on healthy weight loss and exercise to improve blood pressure control. We will watch for signs of hypotension as she continues her lifestyle modifications. We will refill chlorthalidone 12.5 mg q daily with a 90 supply, with no refill.  2. Pre-diabetes Crystal Jackson will continue Victoza, and will continue to work on weight loss, exercise, and decreasing simple carbohydrates to help decrease the risk of diabetes.    3. Class 2 severe obesity with serious comorbidity and body mass index (BMI) of 37.0 to 37.9 in adult, unspecified obesity type (HCC) Crystal Jackson is currently in the action stage of change. As such, her goal is to continue with weight loss efforts. She has agreed to the Category 2 Plan.   Exercise goals: As is.  Behavioral modification strategies: meal planning and cooking strategies and keeping healthy foods in the home.  Crystal Jackson has agreed to follow-up with our clinic  in 2 weeks. She was informed of the importance of frequent follow-up visits to maximize her success with intensive lifestyle modifications for her multiple health conditions.   Objective:   Blood pressure 119/75, pulse 74, temperature 97.6 F (36.4 C), height 5\' 4"  (1.626 m), weight 217 lb (98.4 kg), SpO2 97 %. Body mass index is 37.25 kg/m.  General: Cooperative, alert, well developed, in no acute distress. HEENT: Conjunctivae and lids unremarkable. Cardiovascular: Regular rhythm.  Lungs: Normal work of breathing. Neurologic: No focal deficits.   Lab Results  Component Value Date   CREATININE 1.00 05/27/2019   BUN 25 05/27/2019   NA 138 05/27/2019   K 3.9 05/27/2019   CL 96 05/27/2019   CO2 27 05/27/2019   Lab Results  Component Value Date   ALT 23 05/27/2019   AST 26 05/27/2019   ALKPHOS 51 05/27/2019   BILITOT 0.3 05/27/2019   Lab Results  Component Value Date   HGBA1C 5.3 05/27/2019   HGBA1C 5.4 11/10/2018   HGBA1C 5.8 (H) 07/09/2018   Lab Results  Component Value Date   INSULIN 16.2 05/27/2019   INSULIN 33.0 (H) 11/10/2018   INSULIN 25.0 (H) 07/09/2018   Lab Results  Component Value Date   TSH 2.210 07/09/2018   Lab Results  Component Value Date   CHOL 141 05/27/2019   HDL 55 05/27/2019   LDLCALC 69 05/27/2019   TRIG 88 05/27/2019   Lab Results  Component Value Date   WBC 6.0 07/09/2018   HGB 13.6 07/09/2018   HCT 39.7 07/09/2018   MCV 89 07/09/2018  PLT 241 07/09/2018   No results found for: IRON, TIBC, FERRITIN  Obesity Behavioral Intervention:   Approximately 15 minutes were spent on the discussion below.  ASK: We discussed the diagnosis of obesity with Crystal Jackson today and Crystal Jackson agreed to give [name] permission to discuss obesity behavioral modification therapy today.  ASSESS: Crystal Jackson has the diagnosis of obesity and her BMI today is 37.23. Crystal Jackson is in the action stage of change.   ADVISE: Crystal Jackson was educated on the multiple health  risks of obesity as well as the benefit of weight loss to improve her health. She was advised of the need for long term treatment and the importance of lifestyle modifications to improve her current health and to decrease her risk of future health problems.  AGREE: Multiple dietary modification options and treatment options were discussed and Crystal Jackson agreed to follow the recommendations documented in the above note.  ARRANGE: Crystal Jackson was educated on the importance of frequent visits to treat obesity as outlined per CMS and USPSTF guidelines and agreed to schedule her next follow up appointment today.  Attestation Statements:   Reviewed by clinician on day of visit: allergies, medications, problem list, medical history, surgical history, family history, social history, and previous encounter notes.   [name], am acting as transcriptionist for [employer], PA-C.  I have reviewed the above documentation for accuracy and completeness, and I agree with the above. [name], PA-C

## 2019-09-25 ENCOUNTER — Encounter: Payer: Self-pay | Admitting: Pulmonary Disease

## 2019-09-25 ENCOUNTER — Other Ambulatory Visit: Payer: Self-pay

## 2019-09-25 ENCOUNTER — Ambulatory Visit: Payer: Medicare Other | Admitting: Pulmonary Disease

## 2019-09-25 VITALS — BP 126/62 | HR 88 | Temp 97.3°F | Ht 64.0 in | Wt 220.6 lb

## 2019-09-25 DIAGNOSIS — J4530 Mild persistent asthma, uncomplicated: Secondary | ICD-10-CM | POA: Diagnosis not present

## 2019-09-25 DIAGNOSIS — Z23 Encounter for immunization: Secondary | ICD-10-CM

## 2019-09-25 DIAGNOSIS — G4733 Obstructive sleep apnea (adult) (pediatric): Secondary | ICD-10-CM

## 2019-09-25 NOTE — Patient Instructions (Signed)
I am glad you are doing well with regard to your breathing Continue the inhalers Follow-up in 1 year

## 2019-09-25 NOTE — Progress Notes (Signed)
Azaliah Carrero    [id number]    Jul 30, 1945  Primary Care Physician:Wile, Nyoka Cowden, MD  Referring Physician: Melida Quitter, MD [address] [place],  [place] [address]  Chief complaint:   Follow up for Upper airway chronic cough,  Asthmatic bronchitis. GERD OSA on auto CPAP  HPI: Mrs. Franey is a 74 year old with past medical history of high blood pressure. She had been diagnosed with bronchitis as a child. She reports increasing frequency of attacks of the past few years. Her latest episode was in Nov 2017  Her symptoms start with sinusitis postnasal drip which then proceeds to cough with yellow mucus, wheezing, chest congestion. She does not report any fevers or chills. She has seasonal allergies, rhinitis, postnasal drip. She has significant issues with acid reflux and heartburn and is on Protonix 40 mg twice a day. She was assessed by GI and was told she has a small hiatal hernia.  Symptoms have stabilized on Arnuity inhaler.  In 2019 she stopped it temporarily and noted increasing cough, dyspnea.  Interim History: Symptoms are stable on a new OT She is doing well on CPAP with no issues  Outpatient Encounter Medications as of 09/25/2019  Medication Sig   albuterol (PROAIR HFA) 108 (90 Base) MCG/ACT inhaler Inhale into the lungs.   Ascorbic Acid (VITAMIN C) 100 MG tablet Take 100 mg by mouth daily.   Calcium Carbonate-Vitamin D (CALCIUM-VITAMIN D) 500-200 MG-UNIT tablet Take by mouth.   chlorpheniramine (CHLOR-TRIMETON) 4 MG tablet Take 1 tablet (4 mg total) by mouth every 6 (six) hours.   chlorthalidone (HYGROTON) 25 MG tablet TAKE 1/2 TABLET(12.5 MG) BY MOUTH DAILY   clotrimazole-betamethasone (LOTRISONE) cream Qd for rash   diclofenac (VOLTAREN) 75 MG EC tablet TAKE 1 TABLET BY MOUTH TWICE DAILY WITH FOOD FOR 2 WEEKS THEN AS NEEDED AFTER THAT   dicyclomine (BENTYL) 20 MG tablet Take 20 mg by mouth 4 (four) times daily -  before meals and at bedtime. As needed     doxazosin (CARDURA) 1 MG tablet Take 1 mg by mouth daily.   estradiol (ESTRACE) 1 MG tablet Take 1 mg by mouth daily.   Fluticasone Furoate (ARNUITY ELLIPTA) 200 MCG/ACT AEPB Inhale 1 puff into the lungs daily.   gabapentin (NEURONTIN) 100 MG capsule TK 1 TO 2 CS PO HS   Insulin Pen Needle 32G X 4 MM MISC Use once daily   liraglutide (VICTOZA) 18 MG/3ML SOPN Inject 0.3 mLs (1.8 mg total) into the skin daily.   Magnesium 500 MG CAPS Take by mouth.   meloxicam (MOBIC) 7.5 MG tablet Take 1 tablet (7.5 mg total) by mouth 2 (two) times daily as needed for pain.   metoprolol tartrate (LOPRESSOR) 25 MG tablet Take 25-50 mg by mouth 2 (two) times daily. 50 mg in the am and 50 mg at night   METRONIDAZOLE, TOPICAL, (METROLOTION) 0.75 % LOTN Apply topically.   Multiple Vitamin (MULTIVITAMIN) tablet Take 1 tablet by mouth daily.    Omega-3 Fatty Acids (FISH OIL) 500 MG CAPS Take by mouth.   pantoprazole (PROTONIX) 40 MG tablet Take 40 mg by mouth daily.    potassium chloride (K-DUR) 10 MEQ tablet TAKE 2 TABLETS BY MOUTH TWICE DAILY   SUPER B COMPLEX/C PO Take by mouth.   VALACYCLOVIR HCL PO Take 500 mg by mouth 2 (two) times daily. For 3 days prn   venlafaxine XR (EFFEXOR-XR) 75 MG 24 hr capsule TK 3 CS PO  D   Facility-Administered Encounter Medications as of 09/25/2019  Medication   methylPREDNISolone acetate (DEPO-MEDROL) injection 40 mg   Physical Exam: Blood pressure 126/62, pulse 88, temperature (!) 97.3 F (36.3 C), temperature source Oral, height 5\' 4"  (1.626 m), weight 220 lb 9.6 oz (100.1 kg), SpO2 96 %. Gen:      No acute distress HEENT:  EOMI, sclera anicteric Neck:     No masses; no thyromegaly Lungs:    Clear to auscultation bilaterally; normal respiratory effort CV:         Regular rate and rhythm; no murmurs Abd:      + bowel sounds; soft, non-tender; no palpable masses, no distension Ext:    No edema; adequate peripheral perfusion Skin:      Warm and dry; no  rash Neuro: alert and oriented x 3 Psych: normal mood and affect  Data Reviewed: PFTs 7/44/17 FVC 2.54 [83%], FEV1 1.9 to [83%), F/F 76, TLC 89%, DLCO 72%. Minimal obstructive lung disease with mild reduction in diffusion capacity that corrects for alveolar volume.  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 Low O2 sats of 71%.  CPAP download 09/23/2019 97% usage with residual AHI of 0.3, median pressure of 5.4  CXR 11/28/15- No acute cardiopulmonary disease. Images reviewed.  FENO 04/15/17-18  Assessment/Plan: Upper airway cough syndrome, Asthmatic bronchitis. Her PFTs show minimal obstruction. However there is marked reduction in mid flow rates indicating small airway disease with significant improvement post albuterol inhaler.   Symptoms are stable on steroid inhaler.  It appears that she needs to continue on this as she has worsening symptoms after she ran out of inhalers in the past. Use chlorphentermine and Flonase nasal spray as needed during periods of increased postnasal drip.  OSA Mild OSA on recent home sleep study.  Continues on AutoSet CPAP 5- 15 Download reviewed today with good compliance  GERD Continues on protonix  Plan: - Continue chlorpheniramine, flonase as needed - Continue arnuity inhaler, albuterol PRN -  Autoset CPAP. Work on weight loss.  04/17/17 MD [facility] 09/25/2019, 11:39 AM  CC: 09/27/2019, MD

## 2019-10-01 ENCOUNTER — Encounter (INDEPENDENT_AMBULATORY_CARE_PROVIDER_SITE_OTHER): Payer: Self-pay | Admitting: Physician Assistant

## 2019-10-01 ENCOUNTER — Other Ambulatory Visit: Payer: Self-pay

## 2019-10-01 ENCOUNTER — Ambulatory Visit (INDEPENDENT_AMBULATORY_CARE_PROVIDER_SITE_OTHER): Payer: Medicare Other | Admitting: Physician Assistant

## 2019-10-01 VITALS — BP 101/69 | HR 78 | Temp 97.6°F | Ht 64.0 in | Wt 215.0 lb

## 2019-10-01 DIAGNOSIS — G4733 Obstructive sleep apnea (adult) (pediatric): Secondary | ICD-10-CM

## 2019-10-01 DIAGNOSIS — Z6837 Body mass index (BMI) 37.0-37.9, adult: Secondary | ICD-10-CM

## 2019-10-01 DIAGNOSIS — Z9989 Dependence on other enabling machines and devices: Secondary | ICD-10-CM | POA: Diagnosis not present

## 2019-10-01 DIAGNOSIS — E559 Vitamin D deficiency, unspecified: Secondary | ICD-10-CM

## 2019-10-01 NOTE — Progress Notes (Signed)
Chief Complaint:   OBESITY Crystal Jackson is here to discuss her progress with her obesity treatment plan along with follow-up of her obesity related diagnoses. Crystal Jackson is on the Category 2 Plan and states she is following her eating plan approximately 50% of the time. Crystal Jackson states she is doing yard work for 30-60 minutes 4 times per week.  Today's visit was #: 27 Starting weight: 242 lbs Starting date: 07/09/2018 Today's weight: 215 lbs Today's date: 10/01/2019 Total lbs lost to date: 27 Total lbs lost since last in-office visit: 2  Interim History: Crystal Jackson continues to do well with weight loss and making good choices. She is on 0.9 mg of Victoza, and she states her hunger is controlled.  Subjective:   1. Vitamin D deficiency Crystal Jackson is on OTC multivitamins with Vit D. Last Vit D level was within normal range.  2. OSA on CPAP Crystal Jackson uses CPAP as prescribed. She followed up with Pulmonology last week.  Assessment/Plan:   1. Vitamin D deficiency Low Vitamin D level contributes to fatigue and are associated with obesity, breast, and colon cancer. We will recheck her Vit D level at her next visit. Crystal Jackson will follow-up for routine testing of Vitamin D, at least 2-3 times per year to avoid over-replacement.  2. OSA on CPAP Intensive lifestyle modifications are the first line treatment for this issue. We discussed several lifestyle modifications today. Crystal Jackson will continue her CPAP and will follow up with Pulmonology as needed. She will continue to work on diet, exercise and weight loss efforts. We will continue to monitor. Orders and follow up as documented in patient record.   3. Class 2 severe obesity with serious comorbidity and body mass index (BMI) of 37.0 to 37.9 in adult, unspecified obesity type (HCC) Crystal Jackson is currently in the action stage of change. As such, her goal is to continue with weight loss efforts. She has agreed to the Category 2 Plan or keeping a food journal and  adhering to recommended goals of 1200 calories and 85 grams of protein daily.   We will recheck labs at her next visit.  Exercise goals: As is.  Behavioral modification strategies: meal planning and cooking strategies and avoiding temptations.  Crystal Jackson has agreed to follow-up with our clinic in 2 weeks. She was informed of the importance of frequent follow-up visits to maximize her success with intensive lifestyle modifications for her multiple health conditions.   Objective:   Blood pressure 101/69, pulse 78, temperature 97.6 F (36.4 C), temperature source Oral, height 5\' 4"  (1.626 m), weight 215 lb (97.5 kg), SpO2 97 %. Body mass index is 36.9 kg/m.  General: Cooperative, alert, well developed, in no acute distress. HEENT: Conjunctivae and lids unremarkable. Cardiovascular: Regular rhythm.  Lungs: Normal work of breathing. Neurologic: No focal deficits.   Lab Results  Component Value Date   CREATININE 1.00 05/27/2019   BUN 25 05/27/2019   NA 138 05/27/2019   K 3.9 05/27/2019   CL 96 05/27/2019   CO2 27 05/27/2019   Lab Results  Component Value Date   ALT 23 05/27/2019   AST 26 05/27/2019   ALKPHOS 51 05/27/2019   BILITOT 0.3 05/27/2019   Lab Results  Component Value Date   HGBA1C 5.3 05/27/2019   HGBA1C 5.4 11/10/2018   HGBA1C 5.8 (H) 07/09/2018   Lab Results  Component Value Date   INSULIN 16.2 05/27/2019   INSULIN 33.0 (H) 11/10/2018   INSULIN 25.0 (H) 07/09/2018   Lab Results  Component Value Date   TSH 2.210 07/09/2018   Lab Results  Component Value Date   CHOL 141 05/27/2019   HDL 55 05/27/2019   LDLCALC 69 05/27/2019   TRIG 88 05/27/2019   Lab Results  Component Value Date   WBC 6.0 07/09/2018   HGB 13.6 07/09/2018   HCT 39.7 07/09/2018   MCV 89 07/09/2018   PLT 241 07/09/2018   No results found for: IRON, TIBC, FERRITIN  Obesity Behavioral Intervention:   Approximately 15 minutes were spent on the discussion below.  ASK: We  discussed the diagnosis of obesity with Lamis today and Crystal Jackson agreed to give [name] permission to discuss obesity behavioral modification therapy today.  ASSESS: Crystal Jackson has the diagnosis of obesity and her BMI today is 36.89. Crystal Jackson is in the action stage of change.   ADVISE: Crystal Jackson was educated on the multiple health risks of obesity as well as the benefit of weight loss to improve her health. She was advised of the need for long term treatment and the importance of lifestyle modifications to improve her current health and to decrease her risk of future health problems.  AGREE: Multiple dietary modification options and treatment options were discussed and Crystal Jackson agreed to follow the recommendations documented in the above note.  ARRANGE: Crystal Jackson was educated on the importance of frequent visits to treat obesity as outlined per CMS and USPSTF guidelines and agreed to schedule her next follow up appointment today.  Attestation Statements:   Reviewed by clinician on day of visit: allergies, medications, problem list, medical history, surgical history, family history, social history, and previous encounter notes.   [name], am acting as transcriptionist for [employer], PA-C.  I have reviewed the above documentation for accuracy and completeness, and I agree with the above. [name], PA-C

## 2019-10-14 ENCOUNTER — Encounter: Payer: Self-pay | Admitting: Physician Assistant

## 2019-10-14 ENCOUNTER — Ambulatory Visit: Payer: Medicare Other | Admitting: Physician Assistant

## 2019-10-14 DIAGNOSIS — M1712 Unilateral primary osteoarthritis, left knee: Secondary | ICD-10-CM | POA: Diagnosis not present

## 2019-10-14 MED ORDER — METHYLPREDNISOLONE ACETATE 40 MG/ML IJ SUSP
40.0000 mg | INTRAMUSCULAR | Status: AC | PRN
  Administered 2019-10-14: 40 mg via INTRA_ARTICULAR

## 2019-10-14 MED ORDER — LIDOCAINE HCL 1 % IJ SOLN
5.0000 mL | INTRAMUSCULAR | Status: AC | PRN
  Administered 2019-10-14: 5 mL

## 2019-10-14 NOTE — Progress Notes (Signed)
Office Visit Note   [name]           Date of Birth: 18-Aug-1945           MRN: [id number] Visit Date: 10/14/2019              Requested by: Melida Quitter, MD [address] [place],  [place] [address] PCP: Melida Quitter, MD   Assessment & Plan: Visit Diagnoses: No diagnosis found.  Plan: We will have her follow-up with [name] on an as-needed basis.  She knows to wait at least 3 months between injections.  Questions were encouraged and answered at length.  Would like AP and lateral view of the left knee upon return.  Follow-Up Instructions: Return if symptoms worsen or fail to improve.   Orders:  Orders Placed This Encounter  Procedures  . Large Joint Inj   No orders of the defined types were placed in this encounter.     Procedures: Large Joint Inj: L knee on 10/14/2019 4:57 PM Indications: pain Details: 22 G 1.5 in needle, superolateral approach  Arthrogram: No  Medications: 40 mg methylPREDNISolone acetate 40 MG/ML; 5 mL lidocaine 1 % Aspirate: 42 mL yellow Outcome: tolerated well, no immediate complications Procedure, treatment alternatives, risks and benefits explained, specific risks discussed. Consent was given by the patient. Immediately prior to procedure a time out was called to verify the correct patient, procedure, equipment, support staff and site/side marked as required. Patient was prepped and draped in the usual sterile fashion.       Clinical Data: No additional findings.   Subjective: Chief Complaint  Patient presents with  . Left Knee - Pain    HPI Crystal Jackson well-known to our department service comes in today with left knee pain.  She is requesting injection in the knee.  Last seen in the office on 04/08/2019 and a aspiration was performed.  She had cortisone injections in both knees on 03/11/2019.  She is done well until recently.  She have a pain mostly posterior aspect knee when walking.  She does relate that 3 months ago she fell and  tripped over a box in her room whenever she was walking in the dark may have injured her knee at that time.  She states now she is just having a constant dull achy pain in the knee.  She has known osteoarthritis left knee.  Review of Systems Negative for fevers chills shortness of breath chest pain  Objective: Vital Signs: There were no vitals taken for this visit.  Physical Exam Constitutional:      Appearance: She is not ill-appearing or diaphoretic.  Pulmonary:     Effort: Pulmonary effort is normal.  Neurological:     Mental Status: She is alert and oriented to person, place, and time.     Ortho Exam Bilateral knees no abnormal warmth or erythema.  Left knee positive effusion.  She has good range of motion of both knees but unable to extend the left knee extension.  Significant crepitus with passive range of motion of the left knee. Specialty Comments:  No specialty comments available.  Imaging: No results found.   PMFS History: Patient Active Problem List   Diagnosis Date Noted  . OSA (obstructive sleep apnea) 09/03/2018  . Mild persistent asthma without complication 09/03/2018  . Severe obesity (BMI >= 40) (HCC) 04/14/2018  . Unilateral primary osteoarthritis, left knee 04/14/2018  . Asthma in adult, mild intermittent, uncomplicated 02/26/2017  . Allergic rhinitis  09/05/2013   Past Medical History:  Diagnosis Date  . Anxiety   . Arthritis   . Asthma   . Back pain   . Bilateral swelling of feet   . Constipation   . Depression   . Gallbladder problem   . GERD (gastroesophageal reflux disease)   . History of low potassium   . Hypertension   . Joint pain   . Obesity   . Palpitations   . Shortness of breath   . Sleep apnea     Family History  Problem Relation Age of Onset  . Diabetes Mother   . Heart disease Mother   . Anxiety disorder Mother   . Obesity Mother   . Diabetes Father   . Hypertension Father   . Obesity Father     Past Surgical History:    Procedure Laterality Date  . ABDOMINAL HYSTERECTOMY    . APPENDECTOMY    . CHOLECYSTECTOMY    . KNEE SURGERY Left   . SHOULDER SURGERY Right    Social History   Occupational History  . Not on file  Tobacco Use  . Smoking status: Never Smoker  . Smokeless tobacco: Never Used  Substance and Sexual Activity  . Alcohol use: No    Alcohol/week: 0.0 standard drinks  . Drug use: No  . Sexual activity: Not on file

## 2019-10-19 ENCOUNTER — Ambulatory Visit (INDEPENDENT_AMBULATORY_CARE_PROVIDER_SITE_OTHER): Payer: Medicare Other | Admitting: Physician Assistant

## 2019-10-19 ENCOUNTER — Encounter (INDEPENDENT_AMBULATORY_CARE_PROVIDER_SITE_OTHER): Payer: Self-pay | Admitting: Physician Assistant

## 2019-10-19 ENCOUNTER — Other Ambulatory Visit: Payer: Self-pay

## 2019-10-19 VITALS — BP 119/74 | HR 76 | Temp 97.9°F | Ht 64.0 in | Wt 214.0 lb

## 2019-10-19 DIAGNOSIS — E559 Vitamin D deficiency, unspecified: Secondary | ICD-10-CM

## 2019-10-19 DIAGNOSIS — R7303 Prediabetes: Secondary | ICD-10-CM

## 2019-10-19 DIAGNOSIS — Z6836 Body mass index (BMI) 36.0-36.9, adult: Secondary | ICD-10-CM | POA: Diagnosis not present

## 2019-10-20 LAB — COMPREHENSIVE METABOLIC PANEL
ALT: 25 IU/L (ref 0–32)
AST: 21 IU/L (ref 0–40)
Albumin/Globulin Ratio: 1.4 (ref 1.2–2.2)
Albumin: 3.9 g/dL (ref 3.7–4.7)
Alkaline Phosphatase: 47 IU/L (ref 44–121)
BUN/Creatinine Ratio: 23 (ref 12–28)
BUN: 24 mg/dL (ref 8–27)
Bilirubin Total: 0.3 mg/dL (ref 0.0–1.2)
CO2: 26 mmol/L (ref 20–29)
Calcium: 9.2 mg/dL (ref 8.7–10.3)
Chloride: 92 mmol/L — ABNORMAL LOW (ref 96–106)
Creatinine, Ser: 1.04 mg/dL — ABNORMAL HIGH (ref 0.57–1.00)
GFR calc Af Amer: 62 mL/min/{1.73_m2} (ref >59)
GFR calc non Af Amer: 54 mL/min/{1.73_m2} — ABNORMAL LOW (ref >59)
Globulin, Total: 2.8 g/dL (ref 1.5–4.5)
Glucose: 90 mg/dL (ref 65–99)
Potassium: 3.7 mmol/L (ref 3.5–5.2)
Sodium: 132 mmol/L — ABNORMAL LOW (ref 134–144)
Total Protein: 6.7 g/dL (ref 6.0–8.5)

## 2019-10-20 LAB — HEMOGLOBIN A1C
Est. average glucose Bld gHb Est-mCnc: 108 mg/dL
Hgb A1c MFr Bld: 5.4 % (ref 4.8–5.6)

## 2019-10-20 LAB — VITAMIN D 25 HYDROXY (VIT D DEFICIENCY, FRACTURES): Vit D, 25-Hydroxy: 51.9 ng/mL (ref 30.0–100.0)

## 2019-10-20 LAB — INSULIN, RANDOM: INSULIN: 22.6 u[IU]/mL (ref 2.6–24.9)

## 2019-10-20 NOTE — Progress Notes (Signed)
Chief Complaint:   OBESITY Crystal Jackson is here to discuss her progress with her obesity treatment plan along with follow-up of her obesity related diagnoses. Crystal Jackson is on the Category 2 Plan and states she is following her eating plan approximately 65% of the time. Crystal Jackson states she is walking 10 minutes 2-4 times per week.  Today's visit was #: 28 Starting weight: 242 lbs Starting date: 07/09/2018 Today's weight: 214 lbs Today's date: 10/19/2019 Total lbs lost to date: 28 Total lbs lost since last in-office visit: 1  Interim History: Crystal Jackson continues to do well with weight loss. She traveled this weekend and indulged in ice cream. Overall her hunger is controlled.  Subjective:   Prediabetes. Crystal Jackson has a diagnosis of prediabetes based on her elevated HgA1c and was informed this puts her at greater risk of developing diabetes. She continues to work on diet and exercise to decrease her risk of diabetes. She denies nausea or hypoglycemia. Crystal Jackson is on Victoza, which she is tolerating well. She is due for labs.  Lab Results  Component Value Date   INSULIN 16.2 05/27/2019   INSULIN 33.0 (H) 11/10/2018   INSULIN 25.0 (H) 07/09/2018   Vitamin D deficiency. Crystal Jackson is on Vitamin D with calcium. She is walking a few times weekly outside. She is due for labs.   Ref. Range 10/19/2019 09:51  Vitamin D, 25-Hydroxy Latest Ref Range: 30.0 - 100.0 ng/mL 51.9   Assessment/Plan:   Prediabetes. Crystal Jackson will continue to work on weight loss, exercise, and decreasing simple carbohydrates to help decrease the risk of diabetes. She will increase Victoza to 1.2 mg (no prescription needed). Labs will be checked today.  Vitamin D deficiency. Low Vitamin D level contributes to fatigue and are associated with obesity, breast, and colon cancer. She agrees to continue to take calcium with Vitamin D as directed and VITAMIN D 25 Hydroxy (Vit-D Deficiency, Fractures) level will be checked today.    Class 2 severe obesity with serious comorbidity and body mass index (BMI) of 36.0 to 36.9 in adult, unspecified obesity type (HCC).  Crystal Jackson is currently in the action stage of change. As such, her goal is to continue with weight loss efforts. She has agreed to the Category 2 Plan.   Exercise goals: Older adults should follow the adult guidelines. When older adults cannot meet the adult guidelines, they should be as physically active as their abilities and conditions will allow.   Behavioral modification strategies: meal planning and cooking strategies and keeping healthy foods in the home.  Crystal Jackson has agreed to follow-up with our clinic in 2 weeks. She was informed of the importance of frequent follow-up visits to maximize her success with intensive lifestyle modifications for her multiple health conditions.   Crystal Jackson was informed we would discuss her lab results at her next visit unless there is a critical issue that needs to be addressed sooner. Crystal Jackson agreed to keep her next visit at the agreed upon time to discuss these results.  Objective:   Blood pressure 119/74, pulse 76, temperature 97.9 F (36.6 C), temperature source Oral, height 5\' 4"  (1.626 m), weight 214 lb (97.1 kg), SpO2 98 %. Body mass index is 36.73 kg/m.  General: Cooperative, alert, well developed, in no acute distress. HEENT: Conjunctivae and lids unremarkable. Cardiovascular: Regular rhythm.  Lungs: Normal work of breathing. Neurologic: No focal deficits.   Lab Results  Component Value Date   HGBA1C 5.3 05/27/2019   HGBA1C 5.4 11/10/2018   HGBA1C 5.8 (  H) 07/09/2018   Lab Results  Component Value Date   INSULIN 16.2 05/27/2019   INSULIN 33.0 (H) 11/10/2018   INSULIN 25.0 (H) 07/09/2018   Lab Results  Component Value Date   TSH 2.210 07/09/2018   Lab Results  Component Value Date   CHOL 141 05/27/2019   HDL 55 05/27/2019   LDLCALC 69 05/27/2019   TRIG 88 05/27/2019   Lab Results  Component  Value Date   WBC 6.0 07/09/2018   HGB 13.6 07/09/2018   HCT 39.7 07/09/2018   MCV 89 07/09/2018   PLT 241 07/09/2018   No results found for: IRON, TIBC, FERRITIN  Obesity Behavioral Intervention:   Approximately 15 minutes were spent on the discussion below.  ASK: We discussed the diagnosis of obesity with Crystal Jackson today and Crystal Jackson agreed to give [name] permission to discuss obesity behavioral modification therapy today.  ASSESS: Crystal Jackson has the diagnosis of obesity and her BMI today is 36.8. Crystal Jackson is in the action stage of change.   ADVISE: Crystal Jackson was educated on the multiple health risks of obesity as well as the benefit of weight loss to improve her health. She was advised of the need for long term treatment and the importance of lifestyle modifications to improve her current health and to decrease her risk of future health problems.  AGREE: Multiple dietary modification options and treatment options were discussed and Crystal Jackson agreed to follow the recommendations documented in the above note.  ARRANGE: Crystal Jackson was educated on the importance of frequent visits to treat obesity as outlined per CMS and USPSTF guidelines and agreed to schedule her next follow up appointment today.  Attestation Statements:   Reviewed by clinician on day of visit: allergies, medications, problem list, medical history, surgical history, family history, social history, and previous encounter notes.  IMarianna Payment, am acting as transcriptionist for [name], PA-C   I have reviewed the above documentation for accuracy and completeness, and I agree with the above. [name], PA-C

## 2019-11-02 ENCOUNTER — Encounter (INDEPENDENT_AMBULATORY_CARE_PROVIDER_SITE_OTHER): Payer: Self-pay | Admitting: Physician Assistant

## 2019-11-02 ENCOUNTER — Ambulatory Visit (INDEPENDENT_AMBULATORY_CARE_PROVIDER_SITE_OTHER): Payer: Medicare Other | Admitting: Physician Assistant

## 2019-11-02 ENCOUNTER — Other Ambulatory Visit: Payer: Self-pay

## 2019-11-02 VITALS — BP 110/74 | HR 72 | Temp 97.6°F | Ht 64.0 in | Wt 216.0 lb

## 2019-11-02 DIAGNOSIS — E66812 Obesity, class 2: Secondary | ICD-10-CM

## 2019-11-02 DIAGNOSIS — I10 Essential (primary) hypertension: Secondary | ICD-10-CM | POA: Diagnosis not present

## 2019-11-02 DIAGNOSIS — Z6837 Body mass index (BMI) 37.0-37.9, adult: Secondary | ICD-10-CM | POA: Diagnosis not present

## 2019-11-02 DIAGNOSIS — R7303 Prediabetes: Secondary | ICD-10-CM | POA: Diagnosis not present

## 2019-11-02 DIAGNOSIS — E559 Vitamin D deficiency, unspecified: Secondary | ICD-10-CM

## 2019-11-02 MED ORDER — VICTOZA 18 MG/3ML ~~LOC~~ SOPN: 1.8000 mg | PEN_INJECTOR | Freq: Every day | SUBCUTANEOUS | 0 refills | Status: DC

## 2019-11-02 NOTE — Progress Notes (Signed)
Chief Complaint:   OBESITY Crystal Jackson is here to discuss her progress with her obesity treatment plan along with follow-up of her obesity related diagnoses. Crystal Jackson is on the Category 2 Plan and states she is following her eating plan approximately 50% of the time. Hero states she is walking 10-30 minutes 4 times per week.  Today's visit was #: 29 Starting weight: 242 lbs Starting date: 07/09/2018 Today's weight: 216 lbs Today's date: 11/02/2019 Total lbs lost to date: 26 Total lbs lost since last in-office visit: 0  Interim History: Crystal Jackson states that since last visit she has been "eating whatever I want." She is taking 1.2 mg of Victoza and feeling nauseous in the evening.  Subjective:   Pre-diabetes. Lark has a diagnosis of prediabetes based on her elevated HgA1c and was informed this puts her at greater risk of developing diabetes. She continues to work on diet and exercise to decrease her risk of diabetes. She denies nausea or hypoglycemia. Crystal Jackson is on Victoza and reports evening nausea associated with the medication.  Lab Results  Component Value Date   HGBA1C 5.4 10/19/2019   Lab Results  Component Value Date   INSULIN 22.6 10/19/2019   INSULIN 16.2 05/27/2019   INSULIN 33.0 (H) 11/10/2018   INSULIN 25.0 (H) 07/09/2018   Vitamin D deficiency. Crystal Jackson is on calcium/Vitamin D, which she is tolerating well. Last level was at goal.   Ref. Range 10/19/2019 09:51  Vitamin D, 25-Hydroxy Latest Ref Range: 30.0 - 100.0 ng/mL 51.9   Essential hypertension. Blood pressure is controlled. Crystal Jackson notes dizziness when going from a sitting to a standing position.  BP Readings from Last 3 Encounters:  11/02/19 110/74  10/19/19 119/74  10/01/19 101/69   Lab Results  Component Value Date   CREATININE 1.04 (H) 10/19/2019   CREATININE 1.00 05/27/2019   CREATININE 0.91 11/10/2018   Assessment/Plan:   Pre-diabetes. Suzi will continue to work on weight loss,  exercise, and decreasing simple carbohydrates to help decrease the risk of diabetes. Refill was given for liraglutide (VICTOZA) 18 MG/3ML SOPN #3 pens. Dezarae can decrease Victoza to 0.9 mg and may go back to 1.2 mg when nausea resolves.  Vitamin D deficiency. Low Vitamin D level contributes to fatigue and are associated with obesity, breast, and colon cancer. She agrees to continue to take Vitamin D as directed and will follow-up for routine testing of Vitamin D, at least 2-3 times per year to avoid over-replacement.  Essential hypertension. Sheniece is working on healthy weight loss and exercise to improve blood pressure control. We will watch for signs of hypotension as she continues her lifestyle modifications. She will stop chlorthalidone, check blood pressure daily and bring in daily log to her next visit.  Class 2 severe obesity with serious comorbidity and body mass index (BMI) of 37.0 to 37.9 in adult, unspecified obesity type (HCC).  Crystal Jackson is currently in the action stage of change. As such, her goal is to continue with weight loss efforts. She has agreed to the Category 2 Plan.   Exercise goals: Older adults should follow the adult guidelines. When older adults cannot meet the adult guidelines, they should be as physically active as their abilities and conditions will allow.   Behavioral modification strategies: meal planning and cooking strategies and better snacking choices.  Crystal Jackson has agreed to follow-up with our clinic in 2 weeks. She was informed of the importance of frequent follow-up visits to maximize her success with intensive lifestyle modifications  for her multiple health conditions.   Objective:   Blood pressure 110/74, pulse 72, temperature 97.6 F (36.4 C), height 5\' 4"  (1.626 m), weight 216 lb (98 kg), SpO2 99 %. Body mass index is 37.08 kg/m.  General: Cooperative, alert, well developed, in no acute distress. HEENT: Conjunctivae and lids  unremarkable. Cardiovascular: Regular rhythm.  Lungs: Normal work of breathing. Neurologic: No focal deficits.   Lab Results  Component Value Date   CREATININE 1.04 (H) 10/19/2019   BUN 24 10/19/2019   NA 132 (L) 10/19/2019   K 3.7 10/19/2019   CL 92 (L) 10/19/2019   CO2 26 10/19/2019   Lab Results  Component Value Date   ALT 25 10/19/2019   AST 21 10/19/2019   ALKPHOS 47 10/19/2019   BILITOT 0.3 10/19/2019   Lab Results  Component Value Date   HGBA1C 5.4 10/19/2019   HGBA1C 5.3 05/27/2019   HGBA1C 5.4 11/10/2018   HGBA1C 5.8 (H) 07/09/2018   Lab Results  Component Value Date   INSULIN 22.6 10/19/2019   INSULIN 16.2 05/27/2019   INSULIN 33.0 (H) 11/10/2018   INSULIN 25.0 (H) 07/09/2018   Lab Results  Component Value Date   TSH 2.210 07/09/2018   Lab Results  Component Value Date   CHOL 141 05/27/2019   HDL 55 05/27/2019   LDLCALC 69 05/27/2019   TRIG 88 05/27/2019   Lab Results  Component Value Date   WBC 6.0 07/09/2018   HGB 13.6 07/09/2018   HCT 39.7 07/09/2018   MCV 89 07/09/2018   PLT 241 07/09/2018   No results found for: IRON, TIBC, FERRITIN  Obesity Behavioral Intervention:   Approximately 15 minutes were spent on the discussion below.  ASK: We discussed the diagnosis of obesity with Crystal Jackson today and Deliana agreed to give Crystal Jackson permission to discuss obesity behavioral modification therapy today.  ASSESS: Crystal Jackson has the diagnosis of obesity and her BMI today is 37.2. Crystal Jackson is in the action stage of change.   ADVISE: Crystal Jackson was educated on the multiple health risks of obesity as well as the benefit of weight loss to improve her health. She was advised of the need for long term treatment and the importance of lifestyle modifications to improve her current health and to decrease her risk of future health problems.  AGREE: Multiple dietary modification options and treatment options were discussed and Ren agreed to follow the  recommendations documented in the above note.  ARRANGE: Crystal Jackson was educated on the importance of frequent visits to treat obesity as outlined per CMS and USPSTF guidelines and agreed to schedule her next follow up appointment today.  Attestation Statements:   Reviewed by clinician on day of visit: allergies, medications, problem list, medical history, surgical history, family history, social history, and previous encounter notes.  ILurena Jackson, am acting as transcriptionist for Marianna Payment, PA-C   I have reviewed the above documentation for accuracy and completeness, and I agree with the above. [name] c

## 2019-11-16 ENCOUNTER — Other Ambulatory Visit: Payer: Self-pay

## 2019-11-16 ENCOUNTER — Encounter (INDEPENDENT_AMBULATORY_CARE_PROVIDER_SITE_OTHER): Payer: Self-pay | Admitting: Physician Assistant

## 2019-11-16 ENCOUNTER — Ambulatory Visit (INDEPENDENT_AMBULATORY_CARE_PROVIDER_SITE_OTHER): Payer: Medicare Other | Admitting: Physician Assistant

## 2019-11-16 VITALS — BP 124/71 | HR 75 | Temp 97.6°F | Ht 64.0 in | Wt 219.0 lb

## 2019-11-16 DIAGNOSIS — Z6837 Body mass index (BMI) 37.0-37.9, adult: Secondary | ICD-10-CM

## 2019-11-16 DIAGNOSIS — I10 Essential (primary) hypertension: Secondary | ICD-10-CM

## 2019-11-16 DIAGNOSIS — R7303 Prediabetes: Secondary | ICD-10-CM | POA: Diagnosis not present

## 2019-11-16 DIAGNOSIS — E66812 Obesity, class 2: Secondary | ICD-10-CM

## 2019-11-16 NOTE — Progress Notes (Signed)
Chief Complaint:   OBESITY Crystal Jackson is here to discuss her progress with her obesity treatment plan along with follow-up of her obesity related diagnoses. Crystal Jackson is on the Category 2 Plan and states she is following her eating plan approximately 50% of the time. Crystal Jackson states she is walking 10-15 minutes 3 times per week.  Today's visit was #: 30 Starting weight: 242 lbs Starting date: 07/09/2018 Today's weight: 219 lbs Today's date: 11/16/2019 Total lbs lost to date: 23 Total lbs lost since last in-office visit: 0  Interim History: Crystal Jackson's water weight is up today. She notes that this morning she began having some burning with urination, chills, and lower abdominal pain. She states that her dizziness that was discussed in her previous visit has resolved after restarting "ear exercises."  Subjective:   Essential hypertension. Blood pressures at home have been in the normal to slightly elevated range. No  chest pain or headache. Crystal Jackson has noted an increase in bilateral lower extremity edema.  BP Readings from Last 3 Encounters:  11/16/19 124/71  11/02/19 110/74  10/19/19 119/74   Lab Results  Component Value Date   CREATININE 1.04 (H) 10/19/2019   CREATININE 1.00 05/27/2019   CREATININE 0.91 11/10/2018   Prediabetes. Crystal Jackson has a diagnosis of prediabetes based on her elevated HgA1c and was informed this puts her at greater risk of developing diabetes. She continues to work on diet and exercise to decrease her risk of diabetes. She denies nausea or hypoglycemia. Crystal Jackson is on Victoza 0.9 mg. She forgets to take it 1-2 times per week.  Lab Results  Component Value Date   HGBA1C 5.4 10/19/2019   Lab Results  Component Value Date   INSULIN 22.6 10/19/2019   INSULIN 16.2 05/27/2019   INSULIN 33.0 (H) 11/10/2018   INSULIN 25.0 (H) 07/09/2018   Assessment/Plan:   Essential hypertension. Crystal Jackson is working on healthy weight loss and exercise to improve  blood pressure control. We will watch for signs of hypotension as she continues her lifestyle modifications. She will restart chlorthalidone (HYGROTON) 25 MG tablet 1/2 tab daily (no refill).  Prediabetes. Crystal Jackson will continue to work on weight loss, exercise, and decreasing simple carbohydrates to help decrease the risk of diabetes. She will increase Victoza to 1.2 mg (no script).  Class 2 severe obesity with serious comorbidity and body mass index (BMI) of 37.0 to 37.9 in adult, unspecified obesity type (HCC).  Crystal Jackson is currently in the action stage of change. As such, her goal is to continue with weight loss efforts. She has agreed to the Category 2 Plan.   She will call her PCP today regarding a possible UTI.  Exercise goals: Older adults should follow the adult guidelines. When older adults cannot meet the adult guidelines, they should be as physically active as their abilities and conditions will allow.   Behavioral modification strategies: meal planning and cooking strategies and keeping healthy foods in the home.  Crystal Jackson has agreed to follow-up with our clinic in 2 weeks. She was informed of the importance of frequent follow-up visits to maximize her success with intensive lifestyle modifications for her multiple health conditions.   Objective:   Blood pressure 124/71, pulse 75, temperature 97.6 F (36.4 C), height 5\' 4"  (1.626 m), weight 219 lb (99.3 kg), SpO2 100 %. Body mass index is 37.59 kg/m.  General: Cooperative, alert, well developed, in no acute distress. HEENT: Conjunctivae and lids unremarkable. Cardiovascular: Regular rhythm.  Lungs: Normal work of breathing. Neurologic:  No focal deficits.   Lab Results  Component Value Date   CREATININE 1.04 (H) 10/19/2019   BUN 24 10/19/2019   NA 132 (L) 10/19/2019   K 3.7 10/19/2019   CL 92 (L) 10/19/2019   CO2 26 10/19/2019   Lab Results  Component Value Date   ALT 25 10/19/2019   AST 21 10/19/2019   ALKPHOS 47  10/19/2019   BILITOT 0.3 10/19/2019   Lab Results  Component Value Date   HGBA1C 5.4 10/19/2019   HGBA1C 5.3 05/27/2019   HGBA1C 5.4 11/10/2018   HGBA1C 5.8 (H) 07/09/2018   Lab Results  Component Value Date   INSULIN 22.6 10/19/2019   INSULIN 16.2 05/27/2019   INSULIN 33.0 (H) 11/10/2018   INSULIN 25.0 (H) 07/09/2018   Lab Results  Component Value Date   TSH 2.210 07/09/2018   Lab Results  Component Value Date   CHOL 141 05/27/2019   HDL 55 05/27/2019   LDLCALC 69 05/27/2019   TRIG 88 05/27/2019   Lab Results  Component Value Date   WBC 6.0 07/09/2018   HGB 13.6 07/09/2018   HCT 39.7 07/09/2018   MCV 89 07/09/2018   PLT 241 07/09/2018   No results found for: IRON, TIBC, FERRITIN  Obesity Behavioral Intervention:   Approximately 15 minutes were spent on the discussion below.  ASK: We discussed the diagnosis of obesity with Crystal Jackson today and Crystal Jackson agreed to give [name] permission to discuss obesity behavioral modification therapy today.  ASSESS: Crystal Jackson has the diagnosis of obesity and her BMI today is 37.6. Crystal Jackson is in the action stage of change.   ADVISE: Crystal Jackson was educated on the multiple health risks of obesity as well as the benefit of weight loss to improve her health. She was advised of the need for long term treatment and the importance of lifestyle modifications to improve her current health and to decrease her risk of future health problems.  AGREE: Multiple dietary modification options and treatment options were discussed and Crystal Jackson agreed to follow the recommendations documented in the above note.  ARRANGE: Crystal Jackson was educated on the importance of frequent visits to treat obesity as outlined per CMS and USPSTF guidelines and agreed to schedule her next follow up appointment today.  Attestation Statements:   Reviewed by clinician on day of visit: allergies, medications, problem list, medical history, surgical history, family history, social  history, and previous encounter notes.  Crystal Jackson, am acting as transcriptionist for [name], PA-C   I have reviewed the above documentation for accuracy and completeness, and I agree with the above. [name], PA-C

## 2019-11-17 MED ORDER — CHLORTHALIDONE 25 MG PO TABS: ORAL_TABLET | ORAL | 0 refills | Status: DC

## 2019-12-01 ENCOUNTER — Encounter (INDEPENDENT_AMBULATORY_CARE_PROVIDER_SITE_OTHER): Payer: Self-pay | Admitting: Physician Assistant

## 2019-12-01 ENCOUNTER — Other Ambulatory Visit: Payer: Self-pay

## 2019-12-01 ENCOUNTER — Ambulatory Visit (INDEPENDENT_AMBULATORY_CARE_PROVIDER_SITE_OTHER): Payer: Medicare Other | Admitting: Physician Assistant

## 2019-12-01 VITALS — BP 120/76 | HR 70 | Temp 97.6°F | Ht 64.0 in | Wt 215.0 lb

## 2019-12-01 DIAGNOSIS — Z6837 Body mass index (BMI) 37.0-37.9, adult: Secondary | ICD-10-CM

## 2019-12-01 DIAGNOSIS — G4733 Obstructive sleep apnea (adult) (pediatric): Secondary | ICD-10-CM | POA: Diagnosis not present

## 2019-12-01 DIAGNOSIS — R7303 Prediabetes: Secondary | ICD-10-CM | POA: Diagnosis not present

## 2019-12-02 MED ORDER — INSULIN PEN NEEDLE 32G X 4 MM MISC: 0 refills | Status: DC

## 2019-12-02 NOTE — Progress Notes (Signed)
Chief Complaint:   OBESITY Crystal Jackson is here to discuss her progress with her obesity treatment plan along with follow-up of her obesity related diagnoses. Crystal Jackson is on the Category 2 Plan and states she is following her eating plan approximately 50% of the time. Crystal Jackson states she is walking 10-15 minutes 2-3 times per week.  Today's visit was #: 31 Starting weight: 242 lbs Starting date: 07/09/2018 Today's weight: 215 lbs Today's date: 12/01/2019 Total lbs lost to date: 27 Total lbs lost since last in-office visit: 4  Interim History: Crystal Jackson reports that she is feeling very tired recently. She is only drinking 32 oz of water daily and is skipping lunch sometimes. She is on Victoza 1.2 mg and her hunger is controlled.  Subjective:   Prediabetes. Crystal Jackson has a diagnosis of prediabetes based on her elevated HgA1c and was informed this puts her at greater risk of developing diabetes. She continues to work on diet and exercise to decrease her risk of diabetes. She denies nausea or hypoglycemia. Crystal Jackson is on Victoza. No nausea, vomiting, or diarrhea. Last A1c 5.4.  Lab Results  Component Value Date   HGBA1C 5.4 10/19/2019   Lab Results  Component Value Date   INSULIN 22.6 10/19/2019   INSULIN 16.2 05/27/2019   INSULIN 33.0 (H) 11/10/2018   INSULIN 25.0 (H) 07/09/2018   OSA (obstructive sleep apnea). Crystal Jackson uses CPAP nightly as prescribed and reports sleeping well.  Assessment/Plan:   Prediabetes. Crystal Jackson will continue to work on weight loss, exercise, and decreasing simple carbohydrates to help decrease the risk of diabetes. Refill was given for Insulin Pen Needle 32G X 4 MM MISC #100 with 0 refills.  OSA (obstructive sleep apnea). Intensive lifestyle modifications are the first line treatment for this issue. We discussed several lifestyle modifications today and she will continue to work on diet, exercise and weight loss efforts. We will continue to monitor. Orders and  follow up as documented in patient record. Crystal Jackson will follow-up with her pulmonologist as scheduled and continue CPAP use as directed.   Class 2 severe obesity with serious comorbidity and body mass index (BMI) of 37.0 to 37.9 in adult, unspecified obesity type (HCC).  Crystal Jackson is currently in the action stage of change. As such, her goal is to continue with weight loss efforts. She has agreed to the Category 2 Plan.   Exercise goals: Older adults should follow the adult guidelines. When older adults cannot meet the adult guidelines, they should be as physically active as their abilities and conditions will allow.   Behavioral modification strategies: increasing water intake and no skipping meals.  Crystal Jackson has agreed to follow-up with our clinic in 2-3 weeks. She was informed of the importance of frequent follow-up visits to maximize her success with intensive lifestyle modifications for her multiple health conditions.   Objective:   Blood pressure 120/76, pulse 70, temperature 97.6 F (36.4 C), height 5\' 4"  (1.626 m), weight 215 lb (97.5 kg), SpO2 97 %. Body mass index is 36.9 kg/m.  General: Cooperative, alert, well developed, in no acute distress. HEENT: Conjunctivae and lids unremarkable. Cardiovascular: Regular rhythm.  Lungs: Normal work of breathing. Neurologic: No focal deficits.   Lab Results  Component Value Date   CREATININE 1.04 (H) 10/19/2019   BUN 24 10/19/2019   NA 132 (L) 10/19/2019   K 3.7 10/19/2019   CL 92 (L) 10/19/2019   CO2 26 10/19/2019   Lab Results  Component Value Date   ALT 25  10/19/2019   AST 21 10/19/2019   ALKPHOS 47 10/19/2019   BILITOT 0.3 10/19/2019   Lab Results  Component Value Date   HGBA1C 5.4 10/19/2019   HGBA1C 5.3 05/27/2019   HGBA1C 5.4 11/10/2018   HGBA1C 5.8 (H) 07/09/2018   Lab Results  Component Value Date   INSULIN 22.6 10/19/2019   INSULIN 16.2 05/27/2019   INSULIN 33.0 (H) 11/10/2018   INSULIN 25.0 (H) 07/09/2018    Lab Results  Component Value Date   TSH 2.210 07/09/2018   Lab Results  Component Value Date   CHOL 141 05/27/2019   HDL 55 05/27/2019   LDLCALC 69 05/27/2019   TRIG 88 05/27/2019   Lab Results  Component Value Date   WBC 6.0 07/09/2018   HGB 13.6 07/09/2018   HCT 39.7 07/09/2018   MCV 89 07/09/2018   PLT 241 07/09/2018   No results found for: IRON, TIBC, FERRITIN  Obesity Behavioral Intervention:   Approximately 15 minutes were spent on the discussion below.  ASK: We discussed the diagnosis of obesity with Crystal Jackson today and Crystal Jackson agreed to give [name] permission to discuss obesity behavioral modification therapy today.  ASSESS: Crystal Jackson has the diagnosis of obesity and her BMI today is 37.0. Crystal Jackson is in the action stage of change.   ADVISE: Crystal Jackson was educated on the multiple health risks of obesity as well as the benefit of weight loss to improve her health. She was advised of the need for long term treatment and the importance of lifestyle modifications to improve her current health and to decrease her risk of future health problems.  AGREE: Multiple dietary modification options and treatment options were discussed and Crystal Jackson agreed to follow the recommendations documented in the above note.  ARRANGE: Crystal Jackson was educated on the importance of frequent visits to treat obesity as outlined per CMS and USPSTF guidelines and agreed to schedule her next follow up appointment today.  Attestation Statements:   Reviewed by clinician on day of visit: allergies, medications, problem list, medical history, surgical history, family history, social history, and previous encounter notes.  IMarianna Payment, am acting as transcriptionist for [name], PA-C   I have reviewed the above documentation for accuracy and completeness, and I agree with the above. [name], PA-C

## 2019-12-09 ENCOUNTER — Encounter (INDEPENDENT_AMBULATORY_CARE_PROVIDER_SITE_OTHER): Payer: Self-pay

## 2019-12-09 ENCOUNTER — Other Ambulatory Visit (INDEPENDENT_AMBULATORY_CARE_PROVIDER_SITE_OTHER): Payer: Self-pay | Admitting: Physician Assistant

## 2019-12-09 DIAGNOSIS — R7303 Prediabetes: Secondary | ICD-10-CM

## 2019-12-09 NOTE — Telephone Encounter (Signed)
Lat seen by Kennith Center

## 2019-12-09 NOTE — Telephone Encounter (Signed)
Sent patient a message.

## 2019-12-09 NOTE — Telephone Encounter (Signed)
Last seen by [name] 

## 2019-12-10 NOTE — Telephone Encounter (Signed)
The patient stated :"No, I still have another pen.  No need for refill Thanks for checking ".

## 2019-12-22 ENCOUNTER — Other Ambulatory Visit: Payer: Self-pay

## 2019-12-22 ENCOUNTER — Encounter (INDEPENDENT_AMBULATORY_CARE_PROVIDER_SITE_OTHER): Payer: Self-pay | Admitting: Physician Assistant

## 2019-12-22 ENCOUNTER — Ambulatory Visit (INDEPENDENT_AMBULATORY_CARE_PROVIDER_SITE_OTHER): Payer: Medicare Other | Admitting: Physician Assistant

## 2019-12-22 VITALS — BP 118/77 | HR 83 | Temp 97.8°F | Ht 64.0 in | Wt 215.0 lb

## 2019-12-22 DIAGNOSIS — Z6836 Body mass index (BMI) 36.0-36.9, adult: Secondary | ICD-10-CM | POA: Diagnosis not present

## 2019-12-22 DIAGNOSIS — R7303 Prediabetes: Secondary | ICD-10-CM

## 2019-12-22 DIAGNOSIS — I10 Essential (primary) hypertension: Secondary | ICD-10-CM | POA: Diagnosis not present

## 2019-12-22 MED ORDER — VICTOZA 18 MG/3ML ~~LOC~~ SOPN: 1.8000 mg | PEN_INJECTOR | Freq: Every day | SUBCUTANEOUS | 0 refills | Status: DC

## 2019-12-22 MED ORDER — CHLORTHALIDONE 25 MG PO TABS: ORAL_TABLET | ORAL | 0 refills | Status: DC

## 2019-12-23 NOTE — Progress Notes (Signed)
Chief Complaint:   OBESITY Crystal Jackson is here to discuss her progress with her obesity treatment plan along with follow-up of her obesity related diagnoses. Crystal Jackson is on the Category 2 Plan and states she is following her eating plan approximately 50% of the time. Crystal Jackson states she is walking for 10 minutes 3 times per week.  Today's visit was #: 32 Starting weight: 242 lbs Starting date: 07/09/2018 Today's weight: 215 lbs Today's date: 12/22/2019 Total lbs lost to date: 27 lbs Total lbs lost since last in-office visit: 0  Interim History: Crystal Jackson is happy with maintaining her weight given the amount of stress eating she has done in the past few weeks.  Subjective:   1. Prediabetes Crystal Jackson's last A1c was 5.4.  He is on Victoza 1.2 mg daily and tolerating it well.  No nausea, vomiting, diarrhea.  Lab Results  Component Value Date   HGBA1C 5.4 10/19/2019   Lab Results  Component Value Date   INSULIN 22.6 10/19/2019   INSULIN 16.2 05/27/2019   INSULIN 33.0 (H) 11/10/2018   INSULIN 25.0 (H) 07/09/2018   2. Essential hypertension On chlorthalidone, Cardura, lopressor.  Blood pressure controlled.  No chest pain or headache.  BP Readings from Last 3 Encounters:  12/22/19 118/77  12/01/19 120/76  11/16/19 124/71   Assessment/Plan:   1. Prediabetes Crystal Jackson will continue to work on weight loss, exercise, and decreasing simple carbohydrates to help decrease the risk of diabetes.    -Refill liraglutide (VICTOZA) 18 MG/3ML SOPN; Inject 1.8 mg into the skin daily.  Dispense: 9 mL; Refill: 0  2. Essential hypertension Crystal Jackson is working on healthy weight loss and exercise to improve blood pressure control. We will watch for signs of hypotension as she continues her lifestyle modifications.  -Refill chlorthalidone (HYGROTON) 25 MG tablet; Take 1/2 tab daily  Dispense: 45 tablet; Refill: 0  3. Class 2 severe obesity with serious comorbidity and body mass index (BMI) of 36.0 to  36.9 in adult, unspecified obesity type (HCC)  Crystal Jackson is currently in the action stage of change. As such, her goal is to continue with weight loss efforts. She has agreed to the Category 2 Plan.   Exercise goals: As is.  Behavioral modification strategies: better snacking choices and emotional eating strategies.  Crystal Jackson has agreed to follow-up with our clinic in 3 weeks. She was informed of the importance of frequent follow-up visits to maximize her success with intensive lifestyle modifications for her multiple health conditions.   Objective:   Blood pressure 118/77, pulse 83, temperature 97.8 F (36.6 C), height 5\' 4"  (1.626 m), weight 215 lb (97.5 kg), SpO2 98 %. Body mass index is 36.9 kg/m.  General: Cooperative, alert, well developed, in no acute distress. HEENT: Conjunctivae and lids unremarkable. Cardiovascular: Regular rhythm.  Lungs: Normal work of breathing. Neurologic: No focal deficits.   Lab Results  Component Value Date   CREATININE 1.04 (H) 10/19/2019   BUN 24 10/19/2019   NA 132 (L) 10/19/2019   K 3.7 10/19/2019   CL 92 (L) 10/19/2019   CO2 26 10/19/2019   Lab Results  Component Value Date   ALT 25 10/19/2019   AST 21 10/19/2019   ALKPHOS 47 10/19/2019   BILITOT 0.3 10/19/2019   Lab Results  Component Value Date   HGBA1C 5.4 10/19/2019   HGBA1C 5.3 05/27/2019   HGBA1C 5.4 11/10/2018   HGBA1C 5.8 (H) 07/09/2018   Lab Results  Component Value Date   INSULIN 22.6 10/19/2019  INSULIN 16.2 05/27/2019   INSULIN 33.0 (H) 11/10/2018   INSULIN 25.0 (H) 07/09/2018   Lab Results  Component Value Date   TSH 2.210 07/09/2018   Lab Results  Component Value Date   CHOL 141 05/27/2019   HDL 55 05/27/2019   LDLCALC 69 05/27/2019   TRIG 88 05/27/2019   Lab Results  Component Value Date   WBC 6.0 07/09/2018   HGB 13.6 07/09/2018   HCT 39.7 07/09/2018   MCV 89 07/09/2018   PLT 241 07/09/2018   Obesity Behavioral Intervention:   Approximately  15 minutes were spent on the discussion below.  ASK: We discussed the diagnosis of obesity with Crystal Jackson today and Crystal Jackson agreed to give [name] permission to discuss obesity behavioral modification therapy today.  ASSESS: Crystal Jackson has the diagnosis of obesity and her BMI today is 36.9. Crystal Jackson is in the action stage of change.   ADVISE: Crystal Jackson was educated on the multiple health risks of obesity as well as the benefit of weight loss to improve her health. She was advised of the need for long term treatment and the importance of lifestyle modifications to improve her current health and to decrease her risk of future health problems.  AGREE: Multiple dietary modification options and treatment options were discussed and Crystal Jackson agreed to follow the recommendations documented in the above note.  ARRANGE: Crystal Jackson was educated on the importance of frequent visits to treat obesity as outlined per CMS and USPSTF guidelines and agreed to schedule her next follow up appointment today.  Attestation Statements:   Reviewed by clinician on day of visit: allergies, medications, problem list, medical history, surgical history, family history, social history, and previous encounter notes.  I, Insurance claims handler, CMA, am acting as transcriptionist for [name], PA-C  I have reviewed the above documentation for accuracy and completeness, and I agree with the above. [name], PA-C

## 2019-12-24 ENCOUNTER — Other Ambulatory Visit: Payer: Self-pay | Admitting: Pulmonary Disease

## 2019-12-25 ENCOUNTER — Other Ambulatory Visit (INDEPENDENT_AMBULATORY_CARE_PROVIDER_SITE_OTHER): Payer: Self-pay | Admitting: Physician Assistant

## 2019-12-25 DIAGNOSIS — I10 Essential (primary) hypertension: Secondary | ICD-10-CM

## 2019-12-30 ENCOUNTER — Encounter (INDEPENDENT_AMBULATORY_CARE_PROVIDER_SITE_OTHER): Payer: Self-pay | Admitting: Physician Assistant

## 2019-12-30 DIAGNOSIS — I10 Essential (primary) hypertension: Secondary | ICD-10-CM

## 2019-12-31 MED ORDER — CHLORTHALIDONE 25 MG PO TABS: ORAL_TABLET | ORAL | 0 refills | Status: DC

## 2020-01-12 ENCOUNTER — Ambulatory Visit (INDEPENDENT_AMBULATORY_CARE_PROVIDER_SITE_OTHER): Payer: Medicare Other | Admitting: Physician Assistant

## 2020-01-12 ENCOUNTER — Encounter (INDEPENDENT_AMBULATORY_CARE_PROVIDER_SITE_OTHER): Payer: Self-pay | Admitting: Physician Assistant

## 2020-01-12 ENCOUNTER — Other Ambulatory Visit: Payer: Self-pay

## 2020-01-12 VITALS — BP 103/65 | HR 76 | Temp 97.9°F | Ht 64.0 in | Wt 216.0 lb

## 2020-01-12 DIAGNOSIS — Z6837 Body mass index (BMI) 37.0-37.9, adult: Secondary | ICD-10-CM | POA: Diagnosis not present

## 2020-01-12 DIAGNOSIS — G4733 Obstructive sleep apnea (adult) (pediatric): Secondary | ICD-10-CM | POA: Diagnosis not present

## 2020-01-12 DIAGNOSIS — R7303 Prediabetes: Secondary | ICD-10-CM

## 2020-01-13 NOTE — Progress Notes (Signed)
Chief Complaint:   OBESITY Crystal Jackson is here to discuss her progress with her obesity treatment plan along with follow-up of her obesity related diagnoses. Crystal Jackson is on the Category 2 Plan and states she is following her eating plan approximately 50% of the time. Crystal Jackson states she is walking for 10 minutes 1 time per week.  Today's visit was #: 33 Starting weight: 242 lbs Starting date: 07/09/2018 Today's weight: 216 lbs Today's date: 01/12/2020 Total lbs lost to date: 26 Total lbs lost since last in-office visit: 0  Interim History: Crystal Jackson reports that her eating has been "up and down" over the holidays. She is trying to get re-motivated to get back on the plan.  Subjective:   1. Pre-diabetes Crystal Jackson is on Victoza 1.2 mg, and she denies nausea, vomiting, or diarrhea. Her hunger is not well controlled.  2. OSA (obstructive sleep apnea) Crystal Jackson is using CPAP as prescribed, and she is sleeping well.  Assessment/Plan:   1. Pre-diabetes Crystal Jackson agreed to increase Victoza to 1.5 mg, no refill needed. She will continue to work on weight loss, exercise, and decreasing simple carbohydrates to help decrease the risk of diabetes.   2. OSA (obstructive sleep apnea) Intensive lifestyle modifications are the first line treatment for this issue. We discussed several lifestyle modifications today. Crystal Jackson will continue to follow up with Dr. Isaiah Serge if needed regarding CPAP. She will continue to work on diet, exercise and weight loss efforts. We will continue to monitor. Orders and follow up as documented in patient record.   3. Class 2 severe obesity with serious comorbidity and body mass index (BMI) of 37.0 to 37.9 in adult, unspecified obesity type (HCC) Crystal Jackson is currently in the action stage of change. As such, her goal is to continue with weight loss efforts. She has agreed to the Category 2 Plan.   Exercise goals: As is.  Behavioral modification strategies: meal planning and cooking  strategies and keeping healthy foods in the home.  Crystal Jackson has agreed to follow-up with our clinic in 2 weeks. She was informed of the importance of frequent follow-up visits to maximize her success with intensive lifestyle modifications for her multiple health conditions.   Objective:   Blood pressure 103/65, pulse 76, temperature 97.9 F (36.6 C), height 5\' 4"  (1.626 m), weight 216 lb (98 kg), SpO2 98 %. Body mass index is 37.08 kg/m.  General: Cooperative, alert, well developed, in no acute distress. HEENT: Conjunctivae and lids unremarkable. Cardiovascular: Regular rhythm.  Lungs: Normal work of breathing. Neurologic: No focal deficits.   Lab Results  Component Value Date   CREATININE 1.04 (H) 10/19/2019   BUN 24 10/19/2019   NA 132 (L) 10/19/2019   K 3.7 10/19/2019   CL 92 (L) 10/19/2019   CO2 26 10/19/2019   Lab Results  Component Value Date   ALT 25 10/19/2019   AST 21 10/19/2019   ALKPHOS 47 10/19/2019   BILITOT 0.3 10/19/2019   Lab Results  Component Value Date   HGBA1C 5.4 10/19/2019   HGBA1C 5.3 05/27/2019   HGBA1C 5.4 11/10/2018   HGBA1C 5.8 (H) 07/09/2018   Lab Results  Component Value Date   INSULIN 22.6 10/19/2019   INSULIN 16.2 05/27/2019   INSULIN 33.0 (H) 11/10/2018   INSULIN 25.0 (H) 07/09/2018   Lab Results  Component Value Date   TSH 2.210 07/09/2018   Lab Results  Component Value Date   CHOL 141 05/27/2019   HDL 55 05/27/2019   LDLCALC 69  05/27/2019   TRIG 88 05/27/2019   Lab Results  Component Value Date   WBC 6.0 07/09/2018   HGB 13.6 07/09/2018   HCT 39.7 07/09/2018   MCV 89 07/09/2018   PLT 241 07/09/2018   No results found for: IRON, TIBC, FERRITIN  Obesity Behavioral Intervention:   Approximately 15 minutes were spent on the discussion below.  ASK: We discussed the diagnosis of obesity with Crystal Jackson today and Crystal Jackson agreed to give Crystal Jackson permission to discuss obesity behavioral modification therapy  today.  ASSESS: Crystal Jackson has the diagnosis of obesity and her BMI today is 37.06. Crystal Jackson is in the action stage of change.   ADVISE: Crystal Jackson was educated on the multiple health risks of obesity as well as the benefit of weight loss to improve her health. She was advised of the need for long term treatment and the importance of lifestyle modifications to improve her current health and to decrease her risk of future health problems.  AGREE: Multiple dietary modification options and treatment options were discussed and Crystal Jackson agreed to follow the recommendations documented in the above note.  ARRANGE: Crystal Jackson was educated on the importance of frequent visits to treat obesity as outlined per CMS and USPSTF guidelines and agreed to schedule her next follow up appointment today.  Attestation Statements:   Reviewed by clinician on day of visit: allergies, medications, problem list, medical history, surgical history, family history, social history, and previous encounter notes.   Crystal Jackson, am acting as transcriptionist for [employer], PA-C.  I have reviewed the above documentation for accuracy and completeness, and I agree with the above. [name], PA-C

## 2020-01-26 ENCOUNTER — Ambulatory Visit (INDEPENDENT_AMBULATORY_CARE_PROVIDER_SITE_OTHER): Payer: Medicare Other | Admitting: Physician Assistant

## 2020-01-26 ENCOUNTER — Encounter (INDEPENDENT_AMBULATORY_CARE_PROVIDER_SITE_OTHER): Payer: Self-pay | Admitting: Physician Assistant

## 2020-01-26 ENCOUNTER — Other Ambulatory Visit: Payer: Self-pay

## 2020-01-26 VITALS — BP 125/75 | HR 86 | Temp 98.0°F | Ht 64.0 in | Wt 215.0 lb

## 2020-01-26 DIAGNOSIS — I10 Essential (primary) hypertension: Secondary | ICD-10-CM | POA: Diagnosis not present

## 2020-01-26 DIAGNOSIS — Z6837 Body mass index (BMI) 37.0-37.9, adult: Secondary | ICD-10-CM

## 2020-01-26 DIAGNOSIS — R7303 Prediabetes: Secondary | ICD-10-CM

## 2020-01-26 NOTE — Progress Notes (Unsigned)
Chief Complaint:   OBESITY Crystal Jackson is here to discuss her progress with her obesity treatment plan along with follow-up of her obesity related diagnoses. Crystal Jackson is on the Category 2 Plan and states she is following her eating plan approximately 50% of the time. Crystal Jackson states she is doing 0 minutes 0 times per week.  Today's visit was #: 34 Starting weight: 242 lbs Starting date: 07/09/2018 Today's weight: 215 lbs Today's date: 01/26/2020 Total lbs lost to date: 27 Total lbs lost since last in-office visit: 1  Interim History: Crystal Jackson is happy about losing 1 lb because she has not been following the plan and has been less active recently. She is eating a late breakfast and sometimes skips lunch.  Subjective:   1. Essential hypertension Crystal Jackson's blood pressure is controlled, and is managed by her primary care physician. She is on metoprolol, chlorthalidone, Cardura. She denies dizziness or lightheadedness.  2. Pre-diabetes Crystal Jackson is on Victoza 1.5 mg, and she is tolerating it well. She continues to want to snack often especially in the afternoon.  Assessment/Plan:   1. Essential hypertension Crystal Jackson will continue her medications, and will continue working on healthy weight loss and exercise to improve blood pressure control. We will watch for signs of hypotension as she continues her lifestyle modifications. She will continue to follow up with her primary care physician.  2. Pre-diabetes Crystal Jackson will continue to work on weight loss, exercise, and decreasing simple carbohydrates to help decrease the risk of diabetes. Crystal Jackson agreed to increase Victoza to 1.8 mg, no refill needed.  3. Class 2 severe obesity with serious comorbidity and body mass index (BMI) of 37.0 to 37.9 in adult, unspecified obesity type (HCC) Crystal Jackson is currently in the action stage of change. As such, her goal is to continue with weight loss efforts. She has agreed to the Category 2 Plan.   Exercise goals:  No exercise has been prescribed at this time.  Behavioral modification strategies: no skipping meals and meal planning and cooking strategies.  Crystal Jackson has agreed to follow-up with our clinic in 2 weeks. She was informed of the importance of frequent follow-up visits to maximize her success with intensive lifestyle modifications for her multiple health conditions.   Objective:   Blood pressure 125/75, pulse 86, temperature 98 F (36.7 C), height 5\' 4"  (1.626 m), weight 215 lb (97.5 kg), SpO2 96 %. Body mass index is 36.9 kg/m.  General: Cooperative, alert, well developed, in no acute distress. HEENT: Conjunctivae and lids unremarkable. Cardiovascular: Regular rhythm.  Lungs: Normal work of breathing. Neurologic: No focal deficits.   Lab Results  Component Value Date   CREATININE 1.04 (H) 10/19/2019   BUN 24 10/19/2019   NA 132 (L) 10/19/2019   K 3.7 10/19/2019   CL 92 (L) 10/19/2019   CO2 26 10/19/2019   Lab Results  Component Value Date   ALT 25 10/19/2019   AST 21 10/19/2019   ALKPHOS 47 10/19/2019   BILITOT 0.3 10/19/2019   Lab Results  Component Value Date   HGBA1C 5.4 10/19/2019   HGBA1C 5.3 05/27/2019   HGBA1C 5.4 11/10/2018   HGBA1C 5.8 (H) 07/09/2018   Lab Results  Component Value Date   INSULIN 22.6 10/19/2019   INSULIN 16.2 05/27/2019   INSULIN 33.0 (H) 11/10/2018   INSULIN 25.0 (H) 07/09/2018   Lab Results  Component Value Date   TSH 2.210 07/09/2018   Lab Results  Component Value Date   CHOL 141 05/27/2019  HDL 55 05/27/2019   LDLCALC 69 05/27/2019   TRIG 88 05/27/2019   Lab Results  Component Value Date   WBC 6.0 07/09/2018   HGB 13.6 07/09/2018   HCT 39.7 07/09/2018   MCV 89 07/09/2018   PLT 241 07/09/2018   No results found for: IRON, TIBC, FERRITIN  Obesity Behavioral Intervention:   Approximately 15 minutes were spent on the discussion below.  ASK: We discussed the diagnosis of obesity with Crystal Jackson today and Crystal Jackson agreed  to give [name] permission to discuss obesity behavioral modification therapy today.  ASSESS: Crystal Jackson has the diagnosis of obesity and her BMI today is 36.89. Crystal Jackson is in the action stage of change.   ADVISE: Crystal Jackson was educated on the multiple health risks of obesity as well as the benefit of weight loss to improve her health. She was advised of the need for long term treatment and the importance of lifestyle modifications to improve her current health and to decrease her risk of future health problems.  AGREE: Multiple dietary modification options and treatment options were discussed and Crystal Jackson agreed to follow the recommendations documented in the above note.  ARRANGE: Crystal Jackson was educated on the importance of frequent visits to treat obesity as outlined per CMS and USPSTF guidelines and agreed to schedule her next follow up appointment today.  Attestation Statements:   Reviewed by clinician on day of visit: allergies, medications, problem list, medical history, surgical history, family history, social history, and previous encounter notes.   [name], am acting as transcriptionist for [employer], PA-C.  I have reviewed the above documentation for accuracy and completeness, and I agree with the above. [name], PA-C

## 2020-02-08 ENCOUNTER — Other Ambulatory Visit (INDEPENDENT_AMBULATORY_CARE_PROVIDER_SITE_OTHER): Payer: Self-pay | Admitting: Physician Assistant

## 2020-02-08 DIAGNOSIS — R7303 Prediabetes: Secondary | ICD-10-CM

## 2020-02-10 ENCOUNTER — Encounter (INDEPENDENT_AMBULATORY_CARE_PROVIDER_SITE_OTHER): Payer: Self-pay | Admitting: Physician Assistant

## 2020-02-10 ENCOUNTER — Ambulatory Visit (INDEPENDENT_AMBULATORY_CARE_PROVIDER_SITE_OTHER): Payer: Medicare Other | Admitting: Physician Assistant

## 2020-02-10 ENCOUNTER — Other Ambulatory Visit: Payer: Self-pay

## 2020-02-10 VITALS — BP 113/70 | HR 78 | Temp 97.8°F | Ht 64.0 in | Wt 215.0 lb

## 2020-02-10 DIAGNOSIS — E65 Localized adiposity: Secondary | ICD-10-CM | POA: Diagnosis not present

## 2020-02-10 DIAGNOSIS — R7303 Prediabetes: Secondary | ICD-10-CM | POA: Diagnosis not present

## 2020-02-10 DIAGNOSIS — Z6836 Body mass index (BMI) 36.0-36.9, adult: Secondary | ICD-10-CM | POA: Diagnosis not present

## 2020-02-10 MED ORDER — VICTOZA 18 MG/3ML ~~LOC~~ SOPN: 1.8000 mg | PEN_INJECTOR | Freq: Every day | SUBCUTANEOUS | 0 refills | Status: DC

## 2020-02-10 NOTE — Progress Notes (Signed)
Chief Complaint:   OBESITY Crystal Jackson is here to discuss her progress with her obesity treatment plan along with follow-up of her obesity related diagnoses. Crystal Jackson is on the Category 2 Plan and states she is following her eating plan approximately 50% of the time. Crystal Jackson states she is walking for 10 minutes 1 time per week.  Today's visit was #: 35 Starting weight: 242 lbs Starting date: 07/09/2018 Today's weight: 215 lbs Today's date: 02/10/2020 Total lbs lost to date: 27 Total lbs lost since last in-office visit: 0  Interim History: Crystal Jackson reports that she feels full often when it is meal time due to some snacking in between meals. She is not getting enough protein daily. She continues to crave sweets when she is stressed.  Subjective:   1. Visceral obesity Crystal Jackson's last measurement on our scale was 15, and the goal is <13.  2. Pre-diabetes Crystal Jackson is on Victoza 1.5 mg, but she states she continues to have cravings. Her hunger is controlled.  Assessment/Plan:   1. Visceral obesity Crystal Jackson will continue with weight loss, and she will continue to follow up as directed.  2. Pre-diabetes Crystal Jackson will continue to work on weight loss, exercise, and decreasing simple carbohydrates to help decrease the risk of diabetes. We will refill Victoza for 1 month.  - liraglutide (VICTOZA) 18 MG/3ML SOPN; Inject 1.8 mg into the skin daily.  Dispense: 9 mL; Refill: 0  3. Class 2 severe obesity with serious comorbidity and body mass index (BMI) of 36.0 to 36.9 in adult, unspecified obesity type (HCC) Crystal Jackson is currently in the action stage of change. As such, her goal is to continue with weight loss efforts. She has agreed to the Category 2 Plan.   Exercise goals: As is.  Behavioral modification strategies: better snacking choices and avoiding temptations.  Crystal Jackson has agreed to follow-up with our clinic in 2 weeks. She was informed of the importance of frequent follow-up visits to  maximize her success with intensive lifestyle modifications for her multiple health conditions.   Objective:   Blood pressure 113/70, pulse 78, temperature 97.8 F (36.6 C), height 5\' 4"  (1.626 m), weight 215 lb (97.5 kg), SpO2 98 %. Body mass index is 36.9 kg/m.  General: Cooperative, alert, well developed, in no acute distress. HEENT: Conjunctivae and lids unremarkable. Cardiovascular: Regular rhythm.  Lungs: Normal work of breathing. Neurologic: No focal deficits.   Lab Results  Component Value Date   CREATININE 1.04 (H) 10/19/2019   BUN 24 10/19/2019   NA 132 (L) 10/19/2019   K 3.7 10/19/2019   CL 92 (L) 10/19/2019   CO2 26 10/19/2019   Lab Results  Component Value Date   ALT 25 10/19/2019   AST 21 10/19/2019   ALKPHOS 47 10/19/2019   BILITOT 0.3 10/19/2019   Lab Results  Component Value Date   HGBA1C 5.4 10/19/2019   HGBA1C 5.3 05/27/2019   HGBA1C 5.4 11/10/2018   HGBA1C 5.8 (H) 07/09/2018   Lab Results  Component Value Date   INSULIN 22.6 10/19/2019   INSULIN 16.2 05/27/2019   INSULIN 33.0 (H) 11/10/2018   INSULIN 25.0 (H) 07/09/2018   Lab Results  Component Value Date   TSH 2.210 07/09/2018   Lab Results  Component Value Date   CHOL 141 05/27/2019   HDL 55 05/27/2019   LDLCALC 69 05/27/2019   TRIG 88 05/27/2019   Lab Results  Component Value Date   WBC 6.0 07/09/2018   HGB 13.6 07/09/2018  HCT 39.7 07/09/2018   MCV 89 07/09/2018   PLT 241 07/09/2018   No results found for: IRON, TIBC, FERRITIN  Obesity Behavioral Intervention:   Approximately 15 minutes were spent on the discussion below.  ASK: We discussed the diagnosis of obesity with [name] today and [name] agreed to give [name] permission to discuss obesity behavioral modification therapy today.  ASSESS: [name] has the diagnosis of obesity and her BMI today is 36.99. [name] is in the action stage of change.   ADVISE: [name] was educated on the multiple health risks of obesity  as well as the benefit of weight loss to improve her health. She was advised of the need for long term treatment and the importance of lifestyle modifications to improve her current health and to decrease her risk of future health problems.  AGREE: Multiple dietary modification options and treatment options were discussed and [name] agreed to follow the recommendations documented in the above note.  ARRANGE: [name] was educated on the importance of frequent visits to treat obesity as outlined per CMS and USPSTF guidelines and agreed to schedule her next follow up appointment today.  Attestation Statements:   Reviewed by clinician on day of visit: allergies, medications, problem list, medical history, surgical history, family history, social history, and previous encounter notes.   [name], am acting as transcriptionist for [employer], PA-C.  I have reviewed the above documentation for accuracy and completeness, and I agree with the above. [name], PA-C

## 2020-02-22 ENCOUNTER — Encounter (INDEPENDENT_AMBULATORY_CARE_PROVIDER_SITE_OTHER): Payer: Self-pay | Admitting: Physician Assistant

## 2020-02-22 ENCOUNTER — Other Ambulatory Visit: Payer: Self-pay

## 2020-02-22 ENCOUNTER — Ambulatory Visit (INDEPENDENT_AMBULATORY_CARE_PROVIDER_SITE_OTHER): Payer: Medicare Other | Admitting: Physician Assistant

## 2020-02-22 VITALS — BP 118/75 | HR 76 | Temp 97.6°F | Ht 64.0 in | Wt 212.0 lb

## 2020-02-22 DIAGNOSIS — Z6836 Body mass index (BMI) 36.0-36.9, adult: Secondary | ICD-10-CM

## 2020-02-22 DIAGNOSIS — E559 Vitamin D deficiency, unspecified: Secondary | ICD-10-CM | POA: Diagnosis not present

## 2020-02-22 DIAGNOSIS — M1712 Unilateral primary osteoarthritis, left knee: Secondary | ICD-10-CM

## 2020-02-23 NOTE — Progress Notes (Signed)
Chief Complaint:   OBESITY Crystal Jackson is here to discuss her progress with her obesity treatment plan along with follow-up of her obesity related diagnoses. Crystal Jackson is on the Category 2 Plan and states she is following her eating plan approximately 65% of the time. Crystal Jackson states she is walking for 15 minutes 2 times per week.  Today's visit was #: 36 Starting weight: 242 lbs Starting date: 07/09/2018 Today's weight: 212 lbs Today's date: 02/22/2020 Total lbs lost to date: 30 Total lbs lost since last in-office visit: 3  Interim History: Crystal Jackson did well with weight loss. She has been stressed due to having sick cats. She states that she has been doing better with following the plan. She has been focused the last 2 weeks.  Subjective:   1. Vitamin D deficiency Crystal Jackson is on OTC Vit D. Last Vit D level was at goal.  2. Unilateral primary osteoarthritis, left knee Crystal Jackson is seeing Crystal Jackson, and she is using Voltaren gel as needed.  Assessment/Plan:   1. Vitamin D deficiency Low Vitamin D level contributes to fatigue and are associated with obesity, breast, and colon cancer. Crystal Jackson agreed to continue taking OTC Vitamin D and will follow-up for routine testing of Vitamin D, at least 2-3 times per year to avoid over-replacement.  2. Unilateral primary osteoarthritis, left knee Crystal Jackson will continue with weight loss, and will continue to follow up as directed.  3. Class 2 severe obesity with serious comorbidity and body mass index (BMI) of 36.0 to 36.9 in adult, unspecified obesity type (HCC) Crystal Jackson is currently in the action stage of change. As such, her goal is to continue with weight loss efforts. She has agreed to the Category 2 Plan.   Exercise goals: As is.  Behavioral modification strategies: meal planning and cooking strategies and keeping healthy foods in the home.  Crystal Jackson has agreed to follow-up with our clinic in 2 weeks. She was informed of the importance of frequent  follow-up visits to maximize her success with intensive lifestyle modifications for her multiple health conditions.   Objective:   Blood pressure 118/75, pulse 76, temperature 97.6 F (36.4 C), height 5\' 4"  (1.626 m), weight 212 lb (96.2 kg), SpO2 96 %. Body mass index is 36.39 kg/m.  General: Cooperative, alert, well developed, in no acute distress. HEENT: Conjunctivae and lids unremarkable. Cardiovascular: Regular rhythm.  Lungs: Normal work of breathing. Neurologic: No focal deficits.   Lab Results  Component Value Date   CREATININE 1.04 (H) 10/19/2019   BUN 24 10/19/2019   NA 132 (L) 10/19/2019   K 3.7 10/19/2019   CL 92 (L) 10/19/2019   CO2 26 10/19/2019   Lab Results  Component Value Date   ALT 25 10/19/2019   AST 21 10/19/2019   ALKPHOS 47 10/19/2019   BILITOT 0.3 10/19/2019   Lab Results  Component Value Date   HGBA1C 5.4 10/19/2019   HGBA1C 5.3 05/27/2019   HGBA1C 5.4 11/10/2018   HGBA1C 5.8 (H) 07/09/2018   Lab Results  Component Value Date   INSULIN 22.6 10/19/2019   INSULIN 16.2 05/27/2019   INSULIN 33.0 (H) 11/10/2018   INSULIN 25.0 (H) 07/09/2018   Lab Results  Component Value Date   TSH 2.210 07/09/2018   Lab Results  Component Value Date   CHOL 141 05/27/2019   HDL 55 05/27/2019   LDLCALC 69 05/27/2019   TRIG 88 05/27/2019   Lab Results  Component Value Date   WBC 6.0 07/09/2018   HGB  13.6 07/09/2018   HCT 39.7 07/09/2018   MCV 89 07/09/2018   PLT 241 07/09/2018   No results found for: IRON, TIBC, FERRITIN  Obesity Behavioral Intervention:   Approximately 15 minutes were spent on the discussion below.  ASK: We discussed the diagnosis of obesity with Crystal Jackson today and Crystal Jackson agreed to give [name] permission to discuss obesity behavioral modification therapy today.  ASSESS: Crystal Jackson has the diagnosis of obesity and her BMI today is 36.37. Crystal Jackson is in the action stage of change.   ADVISE: Crystal Jackson was educated on the multiple  health risks of obesity as well as the benefit of weight loss to improve her health. She was advised of the need for long term treatment and the importance of lifestyle modifications to improve her current health and to decrease her risk of future health problems.  AGREE: Multiple dietary modification options and treatment options were discussed and Crystal Jackson agreed to follow the recommendations documented in the above note.  ARRANGE: Crystal Jackson was educated on the importance of frequent visits to treat obesity as outlined per CMS and USPSTF guidelines and agreed to schedule her next follow up appointment today.  Attestation Statements:   Reviewed by clinician on day of visit: allergies, medications, problem list, medical history, surgical history, family history, social history, and previous encounter notes.   Crystal Jackson, am acting as transcriptionist for [employer], PA-C.  I have reviewed the above documentation for accuracy and completeness, and I agree with the above. Crystal Cliche, PA-C

## 2020-03-08 ENCOUNTER — Other Ambulatory Visit: Payer: Self-pay

## 2020-03-08 ENCOUNTER — Encounter (INDEPENDENT_AMBULATORY_CARE_PROVIDER_SITE_OTHER): Payer: Self-pay | Admitting: Physician Assistant

## 2020-03-08 ENCOUNTER — Ambulatory Visit (INDEPENDENT_AMBULATORY_CARE_PROVIDER_SITE_OTHER): Payer: Medicare Other | Admitting: Physician Assistant

## 2020-03-08 VITALS — BP 111/74 | HR 68 | Temp 97.5°F | Ht 64.0 in | Wt 213.0 lb

## 2020-03-08 DIAGNOSIS — R7303 Prediabetes: Secondary | ICD-10-CM | POA: Diagnosis not present

## 2020-03-08 DIAGNOSIS — K5909 Other constipation: Secondary | ICD-10-CM | POA: Diagnosis not present

## 2020-03-08 DIAGNOSIS — Z6836 Body mass index (BMI) 36.0-36.9, adult: Secondary | ICD-10-CM | POA: Diagnosis not present

## 2020-03-08 MED ORDER — INSULIN PEN NEEDLE 32G X 4 MM MISC: 0 refills | Status: DC

## 2020-03-09 NOTE — Progress Notes (Signed)
Chief Complaint:   OBESITY Crystal Jackson is here to discuss her progress with her obesity treatment plan along with follow-up of her obesity related diagnoses. Crystal Jackson is on the Category 2 Plan and states she is following her eating plan approximately 50% of the time. Crystal Jackson states she is walking for 15 minutes 5 times per week for the last 2 weeks.  Today's visit was #: 37 Starting weight: 242 lbs Starting date: 07/09/2018 Today's weight: 213 lbs Today's date: 03/08/2020 Total lbs lost to date: 29 Total lbs lost since last in-office visit: 0  Interim History: Crystal Jackson reports that she has not been eating on the plan as closely the last few weeks. She has been constipated, not drinking enough water, and she has been more sedentary.  Subjective:   1. Pre-diabetes Crystal Jackson is on Victoza, and she denies polyphagia. She denies nausea, vomiting, or diarrhea.  2. Other constipation Crystal Jackson thinks she is not drinking enough water. She is not consistent with eating green veggies daily. She denies blood in her stools, and no black stools.  Assessment/Plan:   1. Pre-diabetes Crystal Jackson will continue to work on weight loss, exercise, and decreasing simple carbohydrates to help decrease the risk of diabetes. We will refill pen needles #100 with no refills.  - Insulin Pen Needle 32G X 4 MM MISC; Use once daily  Dispense: 100 each; Refill: 0  2. Other constipation Crystal Jackson was informed that a decrease in bowel movement frequency is normal while losing weight, but stools should not be hard or painful. Crystal Jackson is to take OTC benefiber, increase water, increase veggies, and increase exercise. Orders and follow up as documented in patient record.   Counseling Getting to Good Bowel Health: Your goal is to have one soft bowel movement each day. Drink at least 8 glasses of water each day. Eat plenty of fiber (goal is over 25 grams each day). It is best to get most of your fiber from dietary sources which  includes leafy green vegetables, fresh fruit, and whole grains. You may need to add fiber with the help of OTC fiber supplements. These include Metamucil, Citrucel, and Flaxseed. If you are still having trouble, try adding Miralax or Magnesium Citrate. If all of these changes do not work, Dietitian.  3. Class 2 severe obesity with serious comorbidity and body mass index (BMI) of 36.0 to 36.9 in adult, unspecified obesity type (HCC) Crystal Jackson is currently in the action stage of change. As such, her goal is to continue with weight loss efforts. She has agreed to the Category 2 Plan.   Exercise goals: As is.  Behavioral modification strategies: increasing lean protein intake and no skipping meals.  Crystal Jackson has agreed to follow-up with our clinic in 2 weeks. She was informed of the importance of frequent follow-up visits to maximize her success with intensive lifestyle modifications for her multiple health conditions.   Objective:   Blood pressure 111/74, pulse 68, temperature (!) 97.5 F (36.4 C), height 5\' 4"  (1.626 m), weight 213 lb (96.6 kg), SpO2 99 %. Body mass index is 36.56 kg/m.  General: Cooperative, alert, well developed, in no acute distress. HEENT: Conjunctivae and lids unremarkable. Cardiovascular: Regular rhythm.  Lungs: Normal work of breathing. Neurologic: No focal deficits.   Lab Results  Component Value Date   CREATININE 1.04 (H) 10/19/2019   BUN 24 10/19/2019   NA 132 (L) 10/19/2019   K 3.7 10/19/2019   CL 92 (L) 10/19/2019   CO2 26 10/19/2019  Lab Results  Component Value Date   ALT 25 10/19/2019   AST 21 10/19/2019   ALKPHOS 47 10/19/2019   BILITOT 0.3 10/19/2019   Lab Results  Component Value Date   HGBA1C 5.4 10/19/2019   HGBA1C 5.3 05/27/2019   HGBA1C 5.4 11/10/2018   HGBA1C 5.8 (H) 07/09/2018   Lab Results  Component Value Date   INSULIN 22.6 10/19/2019   INSULIN 16.2 05/27/2019   INSULIN 33.0 (H) 11/10/2018   INSULIN 25.0 (H)  07/09/2018   Lab Results  Component Value Date   TSH 2.210 07/09/2018   Lab Results  Component Value Date   CHOL 141 05/27/2019   HDL 55 05/27/2019   LDLCALC 69 05/27/2019   TRIG 88 05/27/2019   Lab Results  Component Value Date   WBC 6.0 07/09/2018   HGB 13.6 07/09/2018   HCT 39.7 07/09/2018   MCV 89 07/09/2018   PLT 241 07/09/2018   No results found for: IRON, TIBC, FERRITIN  Obesity Behavioral Intervention:   Approximately 15 minutes were spent on the discussion below.  ASK: We discussed the diagnosis of obesity with Lennon today and Tyniya agreed to give [name] permission to discuss obesity behavioral modification therapy today.  ASSESS: Yoshino has the diagnosis of obesity and her BMI today is 36.54. Kendrah is in the action stage of change.   ADVISE: Jessie was educated on the multiple health risks of obesity as well as the benefit of weight loss to improve her health. She was advised of the need for long term treatment and the importance of lifestyle modifications to improve her current health and to decrease her risk of future health problems.  AGREE: Multiple dietary modification options and treatment options were discussed and Malka agreed to follow the recommendations documented in the above note.  ARRANGE: Marlayna was educated on the importance of frequent visits to treat obesity as outlined per CMS and USPSTF guidelines and agreed to schedule her next follow up appointment today.  Attestation Statements:   Reviewed by clinician on day of visit: allergies, medications, problem list, medical history, surgical history, family history, social history, and previous encounter notes.   [name], am acting as transcriptionist for [employer], PA-C.  I have reviewed the above documentation for accuracy and completeness, and I agree with the above. [name], PA-C

## 2020-03-21 ENCOUNTER — Ambulatory Visit (INDEPENDENT_AMBULATORY_CARE_PROVIDER_SITE_OTHER): Payer: Medicare Other | Admitting: Physician Assistant

## 2020-03-22 ENCOUNTER — Encounter (INDEPENDENT_AMBULATORY_CARE_PROVIDER_SITE_OTHER): Payer: Self-pay | Admitting: Physician Assistant

## 2020-03-22 ENCOUNTER — Other Ambulatory Visit: Payer: Self-pay

## 2020-03-22 ENCOUNTER — Ambulatory Visit (INDEPENDENT_AMBULATORY_CARE_PROVIDER_SITE_OTHER): Payer: Medicare Other | Admitting: Physician Assistant

## 2020-03-22 VITALS — BP 113/76 | HR 76 | Temp 97.6°F | Ht 64.0 in | Wt 214.0 lb

## 2020-03-22 DIAGNOSIS — R7303 Prediabetes: Secondary | ICD-10-CM | POA: Diagnosis not present

## 2020-03-22 DIAGNOSIS — Z6841 Body Mass Index (BMI) 40.0 and over, adult: Secondary | ICD-10-CM | POA: Diagnosis not present

## 2020-03-22 DIAGNOSIS — I10 Essential (primary) hypertension: Secondary | ICD-10-CM

## 2020-03-22 MED ORDER — OZEMPIC (0.25 OR 0.5 MG/DOSE) 2 MG/1.5ML ~~LOC~~ SOPN: 0.2500 mg | PEN_INJECTOR | SUBCUTANEOUS | 0 refills | Status: DC

## 2020-03-22 MED ORDER — CHLORTHALIDONE 25 MG PO TABS: ORAL_TABLET | ORAL | 0 refills | Status: DC

## 2020-03-24 NOTE — Progress Notes (Signed)
Chief Complaint:   OBESITY Crystal Jackson is here to discuss her progress with her obesity treatment plan along with follow-up of her obesity related diagnoses. Crystal Jackson is on the Category 2 Plan and states she is following her eating plan approximately 35% of the time. Crystal Jackson states she walked for 15 minutes 7 times per week.  Today's visit was #: 38 Starting weight: 242 lbs Starting date: 07/09/2018 Today's weight: 214 lbs Today's date: 03/22/2020 Total lbs lost to date: 28 Total lbs lost since last in-office visit: 0  Interim History: Crystal Jackson has been worried about her cat and she has been stress eating. She has not been walking as often due to windy conditions outside. She is unsure if Victoza is helping.  Subjective:   1. Essential hypertension Crystal Jackson is on chlorthalidone, and her blood pressure is controlled. She denies headache or chest pain.  2. Pre-diabetes Crystal Jackson is on Victoza, but she notes it is no longer working and her appetite is uncontrolled.  Assessment/Plan:   1. Essential hypertension Crystal Jackson is working on healthy weight loss and exercise to improve blood pressure control. We will watch for signs of hypotension as she continues her lifestyle modifications. We will refill chlorthalidone for 1 month.  - chlorthalidone (HYGROTON) 25 MG tablet; Take 1/2 tab daily  Dispense: 45 tablet; Refill: 0  2. Pre-diabetes Crystal Jackson agreed to discontinue Victoza, and start Ozempic 0.25 mg with no refills. She will continue to work on weight loss, exercise, and decreasing simple carbohydrates to help decrease the risk of diabetes.   - Semaglutide,0.25 or 0.5MG /DOS, (OZEMPIC, 0.25 OR 0.5 MG/DOSE,) 2 MG/1.5ML SOPN; Inject 0.25 mg into the skin once a week.  Dispense: 1.5 mL; Refill: 0  3. Class 3 severe obesity with serious comorbidity and body mass index (BMI) of 40.0 to 44.9 in adult, unspecified obesity type (HCC) Crystal Jackson is currently in the action stage of change. As such, her  goal is to continue with weight loss efforts. She has agreed to the Category 2 Plan.   Exercise goals: As is.  Behavioral modification strategies: increasing lean protein intake, decreasing simple carbohydrates and meal planning and cooking strategies.  Crystal Jackson has agreed to follow-up with our clinic in 2 weeks. She was informed of the importance of frequent follow-up visits to maximize her success with intensive lifestyle modifications for her multiple health conditions.   Objective:   Blood pressure 113/76, pulse 76, temperature 97.6 F (36.4 C), height 5\' 4"  (1.626 m), weight 214 lb (97.1 kg), SpO2 99 %. Body mass index is 36.73 kg/m.  General: Cooperative, alert, well developed, in no acute distress. HEENT: Conjunctivae and lids unremarkable. Cardiovascular: Regular rhythm.  Lungs: Normal work of breathing. Neurologic: No focal deficits.   Lab Results  Component Value Date   CREATININE 1.04 (H) 10/19/2019   BUN 24 10/19/2019   NA 132 (L) 10/19/2019   K 3.7 10/19/2019   CL 92 (L) 10/19/2019   CO2 26 10/19/2019   Lab Results  Component Value Date   ALT 25 10/19/2019   AST 21 10/19/2019   ALKPHOS 47 10/19/2019   BILITOT 0.3 10/19/2019   Lab Results  Component Value Date   HGBA1C 5.4 10/19/2019   HGBA1C 5.3 05/27/2019   HGBA1C 5.4 11/10/2018   HGBA1C 5.8 (H) 07/09/2018   Lab Results  Component Value Date   INSULIN 22.6 10/19/2019   INSULIN 16.2 05/27/2019   INSULIN 33.0 (H) 11/10/2018   INSULIN 25.0 (H) 07/09/2018   Lab Results  Component Value Date   TSH 2.210 07/09/2018   Lab Results  Component Value Date   CHOL 141 05/27/2019   HDL 55 05/27/2019   LDLCALC 69 05/27/2019   TRIG 88 05/27/2019   Lab Results  Component Value Date   WBC 6.0 07/09/2018   HGB 13.6 07/09/2018   HCT 39.7 07/09/2018   MCV 89 07/09/2018   PLT 241 07/09/2018   No results found for: IRON, TIBC, FERRITIN  Obesity Behavioral Intervention:   Approximately 15 minutes were  spent on the discussion below.  ASK: We discussed the diagnosis of obesity with Crystal Jackson today and Crystal Jackson agreed to give [name] permission to discuss obesity behavioral modification therapy today.  ASSESS: Crystal Jackson has the diagnosis of obesity and her BMI today is 36.72. Crystal Jackson is in the action stage of change.   ADVISE: Crystal Jackson was educated on the multiple health risks of obesity as well as the benefit of weight loss to improve her health. She was advised of the need for long term treatment and the importance of lifestyle modifications to improve her current health and to decrease her risk of future health problems.  AGREE: Multiple dietary modification options and treatment options were discussed and [name] agreed to follow the recommendations documented in the above note.  ARRANGE: Crystal Jackson was educated on the importance of frequent visits to treat obesity as outlined per CMS and USPSTF guidelines and agreed to schedule her next follow up appointment today.  Attestation Statements:   Reviewed by clinician on day of visit: allergies, medications, problem list, medical history, surgical history, family history, social history, and previous encounter notes.   [name], am acting as transcriptionist for [employer], PA-C.  I have reviewed the above documentation for accuracy and completeness, and I agree with the above. [name], PA-C

## 2020-04-06 ENCOUNTER — Ambulatory Visit (INDEPENDENT_AMBULATORY_CARE_PROVIDER_SITE_OTHER): Payer: Medicare Other | Admitting: Physician Assistant

## 2020-04-13 ENCOUNTER — Encounter (INDEPENDENT_AMBULATORY_CARE_PROVIDER_SITE_OTHER): Payer: Self-pay | Admitting: Physician Assistant

## 2020-04-13 NOTE — Telephone Encounter (Signed)
Last seen [name] 

## 2020-04-14 NOTE — Telephone Encounter (Signed)
Please review and advise.

## 2020-04-20 ENCOUNTER — Ambulatory Visit (INDEPENDENT_AMBULATORY_CARE_PROVIDER_SITE_OTHER): Payer: Medicare Other | Admitting: Physician Assistant

## 2020-04-20 ENCOUNTER — Other Ambulatory Visit: Payer: Self-pay

## 2020-04-20 ENCOUNTER — Encounter (INDEPENDENT_AMBULATORY_CARE_PROVIDER_SITE_OTHER): Payer: Self-pay | Admitting: Physician Assistant

## 2020-04-20 VITALS — BP 125/79 | HR 78 | Temp 97.6°F | Ht 64.0 in | Wt 218.0 lb

## 2020-04-20 DIAGNOSIS — Z6841 Body Mass Index (BMI) 40.0 and over, adult: Secondary | ICD-10-CM | POA: Diagnosis not present

## 2020-04-20 DIAGNOSIS — R7303 Prediabetes: Secondary | ICD-10-CM | POA: Diagnosis not present

## 2020-04-20 DIAGNOSIS — I10 Essential (primary) hypertension: Secondary | ICD-10-CM

## 2020-04-20 MED ORDER — OZEMPIC (0.25 OR 0.5 MG/DOSE) 2 MG/1.5ML ~~LOC~~ SOPN: 0.5000 mg | PEN_INJECTOR | SUBCUTANEOUS | 0 refills | Status: DC

## 2020-04-25 ENCOUNTER — Ambulatory Visit (INDEPENDENT_AMBULATORY_CARE_PROVIDER_SITE_OTHER): Payer: Medicare Other | Admitting: Physician Assistant

## 2020-04-25 ENCOUNTER — Encounter (INDEPENDENT_AMBULATORY_CARE_PROVIDER_SITE_OTHER): Payer: Self-pay | Admitting: Physician Assistant

## 2020-04-25 NOTE — Progress Notes (Signed)
Chief Complaint:   OBESITY Crystal Jackson is here to discuss her progress with her obesity treatment plan along with follow-up of her obesity related diagnoses. Crystal Jackson is on the Category 2 Plan and states she is following her eating plan approximately 20% of the time. Crystal Jackson states she is doing 0 minutes 0 times per week.  Today's visit was #: 39 Starting weight: 242 lbs Starting date: 07/09/2018 Today's weight: 218 lbs Today's date: 04/20/2020 Total lbs lost to date: 24 Total lbs lost since last in-office visit: 0  Interim History: Crystal Jackson reports that her cat has been sick, and she has been stressed. She states she is eating the wrong things.  Subjective:   1. Pre-diabetes Crystal Jackson is on Ozempic 0.25 mg, and she states it is not working.  2. Essential hypertension Crystal Jackson is on Cardura and Lopressor. Her blood pressure is controlled, and she denies headache or chest pain.  Assessment/Plan:   1. Pre-diabetes Crystal Jackson agreed to increase Ozempic to 0.5 mg with no refills. She will continue to work on weight loss, exercise, and decreasing simple carbohydrates to help decrease the risk of diabetes.   - Semaglutide,0.25 or 0.5MG /DOS, (OZEMPIC, 0.25 OR 0.5 MG/DOSE,) 2 MG/1.5ML SOPN; Inject 0.5 mg into the skin once a week.  Dispense: 1.5 mL; Refill: 0  2. Essential hypertension Crystal Jackson will continue her medications, meal plan, and exercise to improve blood pressure control. We will watch for signs of hypotension as she continues her lifestyle modifications.  3. Class 3 severe obesity with serious comorbidity and body mass index (BMI) of 40.0 to 44.9 in adult, unspecified obesity type (HCC) Crystal Jackson is currently in the action stage of change. As such, her goal is to continue with weight loss efforts. She has agreed to the Category 2 Plan.   Exercise goals: No exercise has been prescribed at this time.  Behavioral modification strategies: no skipping meals and meal planning and cooking  strategies.  Crystal Jackson has agreed to follow-up with our clinic in 2 to 3 weeks. She was informed of the importance of frequent follow-up visits to maximize her success with intensive lifestyle modifications for her multiple health conditions.   Objective:   Blood pressure 125/79, pulse 78, temperature 97.6 F (36.4 C), height 5\' 4"  (1.626 m), weight 218 lb (98.9 kg), SpO2 97 %. Body mass index is 37.42 kg/m.  General: Cooperative, alert, well developed, in no acute distress. HEENT: Conjunctivae and lids unremarkable. Cardiovascular: Regular rhythm.  Lungs: Normal work of breathing. Neurologic: No focal deficits.   Lab Results  Component Value Date   CREATININE 1.04 (H) 10/19/2019   BUN 24 10/19/2019   NA 132 (L) 10/19/2019   K 3.7 10/19/2019   CL 92 (L) 10/19/2019   CO2 26 10/19/2019   Lab Results  Component Value Date   ALT 25 10/19/2019   AST 21 10/19/2019   ALKPHOS 47 10/19/2019   BILITOT 0.3 10/19/2019   Lab Results  Component Value Date   HGBA1C 5.4 10/19/2019   HGBA1C 5.3 05/27/2019   HGBA1C 5.4 11/10/2018   HGBA1C 5.8 (H) 07/09/2018   Lab Results  Component Value Date   INSULIN 22.6 10/19/2019   INSULIN 16.2 05/27/2019   INSULIN 33.0 (H) 11/10/2018   INSULIN 25.0 (H) 07/09/2018   Lab Results  Component Value Date   TSH 2.210 07/09/2018   Lab Results  Component Value Date   CHOL 141 05/27/2019   HDL 55 05/27/2019   LDLCALC 69 05/27/2019   TRIG 88  05/27/2019   Lab Results  Component Value Date   WBC 6.0 07/09/2018   HGB 13.6 07/09/2018   HCT 39.7 07/09/2018   MCV 89 07/09/2018   PLT 241 07/09/2018   No results found for: IRON, TIBC, FERRITIN  Obesity Behavioral Intervention:   Approximately 15 minutes were spent on the discussion below.  ASK: We discussed the diagnosis of obesity with Crystal Jackson today and Crystal Jackson agreed to give [name] permission to discuss obesity behavioral modification therapy today.  ASSESS: Crystal Jackson has the diagnosis of  obesity and her BMI today is 37.4. Crystal Jackson is in the action stage of change.   ADVISE: Crystal Jackson was educated on the multiple health risks of obesity as well as the benefit of weight loss to improve her health. She was advised of the need for long term treatment and the importance of lifestyle modifications to improve her current health and to decrease her risk of future health problems.  AGREE: Multiple dietary modification options and treatment options were discussed and Crystal Jackson agreed to follow the recommendations documented in the above note.  ARRANGE: Crystal Jackson was educated on the importance of frequent visits to treat obesity as outlined per CMS and USPSTF guidelines and agreed to schedule her next follow up appointment today.  Attestation Statements:   Reviewed by clinician on day of visit: allergies, medications, problem list, medical history, surgical history, family history, social history, and previous encounter notes.   [name], am acting as transcriptionist for [employer], PA-C.  I have reviewed the above documentation for accuracy and completeness, and I agree with the above. [name], PA-C

## 2020-04-26 NOTE — Telephone Encounter (Signed)
Please advise 

## 2020-04-28 ENCOUNTER — Ambulatory Visit: Payer: Medicare Other | Admitting: Orthopaedic Surgery

## 2020-05-02 ENCOUNTER — Encounter: Payer: Self-pay | Admitting: Physician Assistant

## 2020-05-02 ENCOUNTER — Ambulatory Visit (INDEPENDENT_AMBULATORY_CARE_PROVIDER_SITE_OTHER): Payer: Medicare Other | Admitting: Physician Assistant

## 2020-05-02 ENCOUNTER — Ambulatory Visit: Payer: Self-pay

## 2020-05-02 DIAGNOSIS — M1712 Unilateral primary osteoarthritis, left knee: Secondary | ICD-10-CM

## 2020-05-02 MED ORDER — METHYLPREDNISOLONE ACETATE 40 MG/ML IJ SUSP
40.0000 mg | INTRAMUSCULAR | Status: AC | PRN
  Administered 2020-05-02: 40 mg via INTRA_ARTICULAR

## 2020-05-02 MED ORDER — LIDOCAINE HCL 1 % IJ SOLN
3.0000 mL | INTRAMUSCULAR | Status: AC | PRN
  Administered 2020-05-02: 3 mL

## 2020-05-02 NOTE — Addendum Note (Signed)
Addended by: Barbette Or on: 05/02/2020 03:27 PM   Modules accepted: Orders

## 2020-05-02 NOTE — Progress Notes (Signed)
Office Visit Note   [name]           Date of Birth: 03/11/1945           MRN: [id number] Visit Date: 05/02/2020              Requested by: Melida Quitter, MD [address] [place],  [place] [address] PCP: Melida Quitter, MD   Assessment & Plan: Visit Diagnoses:  1. Primary osteoarthritis of left knee     Plan: Patient tolerated injection well.  She does request to go back to physical therapy for her them to show her exercises for quad strengthening.  I talked her about quad strengthening today.  Questions were encouraged and answered.  She knows to wait least 3 months between injections.  Follow-Up Instructions: Return if symptoms worsen or fail to improve.   Orders:  Orders Placed This Encounter  Procedures  . Large Joint Inj   No orders of the defined types were placed in this encounter.     Procedures: Large Joint Inj: L knee on 05/02/2020 2:59 PM Indications: pain Details: 22 G 1.5 in needle, superolateral approach  Arthrogram: No  Medications: 3 mL lidocaine 1 %; 40 mg methylPREDNISolone acetate 40 MG/ML Aspirate: 10 mL yellow Outcome: tolerated well, no immediate complications Procedure, treatment alternatives, risks and benefits explained, specific risks discussed. Consent was given by the patient. Immediately prior to procedure a time out was called to verify the correct patient, procedure, equipment, support staff and site/side marked as required. Patient was prepped and draped in the usual sterile fashion.       Clinical Data: No additional findings.   Subjective: Chief Complaint  Patient presents with  . Left Knee - Pain    HPI   Laughery comes in today for left knee pain.  She has known osteoarthritis left knee.  She states pains been ongoing for the past 3 months.  She was last seen 10/14/2019 was given a cortisone injection at that time states that that helped.  She been taking ibuprofen and using Voltaren gel on the knee.  She denies any  recent injury or fall.  Denies any ongoing infections.  Review of Systems  Constitutional: Negative for chills and fever.     Objective: Vital Signs: There were no vitals taken for this visit.  Physical Exam Constitutional:      Appearance: She is not ill-appearing or diaphoretic.  Neurological:     Mental Status: She is alert and oriented to person, place, and time.  Psychiatric:        Mood and Affect: Mood normal.     Ortho Exam Left knee good range of motion.  Patellofemoral crepitus.  Patella tracks laterally.  No instability with valgus varus stressing.  No abnormal warmth erythema.  Plus minus effusion.   Specialty Comments:  No specialty comments available.  Imaging: No results found.   PMFS History: Patient Active Problem List   Diagnosis Date Noted  . OSA (obstructive sleep apnea) 09/03/2018  . Mild persistent asthma without complication 09/03/2018  . Severe obesity (BMI >= 40) (HCC) 04/14/2018  . Unilateral primary osteoarthritis, left knee 04/14/2018  . Asthma in adult, mild intermittent, uncomplicated 02/26/2017  . Allergic rhinitis 09/05/2013   Past Medical History:  Diagnosis Date  . Anxiety   . Arthritis   . Asthma   . Back pain   . Bilateral swelling of feet   . Constipation   . Depression   .  Gallbladder problem   . GERD (gastroesophageal reflux disease)   . History of low potassium   . Hypertension   . Joint pain   . Obesity   . Palpitations   . Shortness of breath   . Sleep apnea     Family History  Problem Relation Age of Onset  . Diabetes Mother   . Heart disease Mother   . Anxiety disorder Mother   . Obesity Mother   . Diabetes Father   . Hypertension Father   . Obesity Father     Past Surgical History:  Procedure Laterality Date  . ABDOMINAL HYSTERECTOMY    . APPENDECTOMY    . CHOLECYSTECTOMY    . KNEE SURGERY Left   . SHOULDER SURGERY Right    Social History   Occupational History  . Not on file  Tobacco Use  .  Smoking status: Never Smoker  . Smokeless tobacco: Never Used  Substance and Sexual Activity  . Alcohol use: No    Alcohol/week: 0.0 standard drinks  . Drug use: No  . Sexual activity: Not on file

## 2020-05-09 ENCOUNTER — Ambulatory Visit (INDEPENDENT_AMBULATORY_CARE_PROVIDER_SITE_OTHER): Payer: Medicare Other | Admitting: Physician Assistant

## 2020-05-09 ENCOUNTER — Encounter (INDEPENDENT_AMBULATORY_CARE_PROVIDER_SITE_OTHER): Payer: Self-pay | Admitting: Physician Assistant

## 2020-05-09 ENCOUNTER — Other Ambulatory Visit: Payer: Self-pay

## 2020-05-09 ENCOUNTER — Other Ambulatory Visit (INDEPENDENT_AMBULATORY_CARE_PROVIDER_SITE_OTHER): Payer: Self-pay | Admitting: Physician Assistant

## 2020-05-09 VITALS — BP 111/72 | HR 71 | Temp 97.5°F | Ht 64.0 in | Wt 214.0 lb

## 2020-05-09 DIAGNOSIS — F3289 Other specified depressive episodes: Secondary | ICD-10-CM

## 2020-05-09 DIAGNOSIS — R7303 Prediabetes: Secondary | ICD-10-CM

## 2020-05-09 DIAGNOSIS — Z6841 Body Mass Index (BMI) 40.0 and over, adult: Secondary | ICD-10-CM | POA: Diagnosis not present

## 2020-05-09 MED ORDER — TOPIRAMATE 25 MG PO TABS: 25.0000 mg | ORAL_TABLET | Freq: Every day | ORAL | 0 refills | Status: DC

## 2020-05-10 NOTE — Telephone Encounter (Signed)
Crystal Jackson 

## 2020-05-10 NOTE — Progress Notes (Signed)
Chief Complaint:   OBESITY Crystal Jackson is here to discuss her progress with her obesity treatment plan along with follow-up of her obesity related diagnoses. Crystal Jackson is on the Category 2 Plan and states she is following her eating plan approximately 50% of the time. Crystal Jackson states she is walking for 15 minutes 2 times per week.  Today's visit was #: 40 Starting weight: 242 lbs Starting date: 07/09/2018 Today's weight: 214 lbs Today's date: 05/09/2020 Total lbs lost to date: 28 Total lbs lost since last in-office visit: 4  Interim History: Crystal Jackson did well with weight loss. She is worried that staying on Ozempic will put her in the "donut hole" soon, and she will not be able to pay for it. She has previously been on Qsymia which worked well for her.  Subjective:   1. Pre-diabetes Crystal Jackson is on Ozempic, and she is tolerating it well. Last A1c was 5.4.  2. Other depression, with emotional eating  Crystal Jackson denies a history of kidney stones or seizures. She reports cravings.  Assessment/Plan:   1. Pre-diabetes Sahian agreed to discontinue Ozempic for financial reasons, and she will continue to work on weight loss, exercise, and decreasing simple carbohydrates to help decrease the risk of diabetes.   2. Other depression, with emotional eating  Behavior modification techniques were discussed today to help Crystal Jackson deal with her emotional/non-hunger eating behaviors. Crystal Jackson agreed to start topiramate 25 mg 1 tablet PO qhs with no refills. Orders and follow up as documented in patient record.   - topiramate (TOPAMAX) 25 MG tablet; Take 1 tablet (25 mg total) by mouth at bedtime.  Dispense: 30 tablet; Refill: 0  3. Class 3 severe obesity with serious comorbidity and body mass index (BMI) of 40.0 to 44.9 in adult, unspecified obesity type (HCC) Crystal Jackson is currently in the action stage of change. As such, her goal is to continue with weight loss efforts. She has agreed to the Category 2 Plan.    Exercise goals: As is.  Behavioral modification strategies: meal planning and cooking strategies and keeping healthy foods in the home.  Crystal Jackson has agreed to follow-up with our clinic in 2 weeks. She was informed of the importance of frequent follow-up visits to maximize her success with intensive lifestyle modifications for her multiple health conditions.   Objective:   Blood pressure 111/72, pulse 71, temperature (!) 97.5 F (36.4 C), height 5\' 4"  (1.626 m), weight 214 lb (97.1 kg), SpO2 98 %. Body mass index is 36.73 kg/m.  General: Cooperative, alert, well developed, in no acute distress. HEENT: Conjunctivae and lids unremarkable. Cardiovascular: Regular rhythm.  Lungs: Normal work of breathing. Neurologic: No focal deficits.   Lab Results  Component Value Date   CREATININE 1.04 (H) 10/19/2019   BUN 24 10/19/2019   NA 132 (L) 10/19/2019   K 3.7 10/19/2019   CL 92 (L) 10/19/2019   CO2 26 10/19/2019   Lab Results  Component Value Date   ALT 25 10/19/2019   AST 21 10/19/2019   ALKPHOS 47 10/19/2019   BILITOT 0.3 10/19/2019   Lab Results  Component Value Date   HGBA1C 5.4 10/19/2019   HGBA1C 5.3 05/27/2019   HGBA1C 5.4 11/10/2018   HGBA1C 5.8 (H) 07/09/2018   Lab Results  Component Value Date   INSULIN 22.6 10/19/2019   INSULIN 16.2 05/27/2019   INSULIN 33.0 (H) 11/10/2018   INSULIN 25.0 (H) 07/09/2018   Lab Results  Component Value Date   TSH 2.210 07/09/2018  Lab Results  Component Value Date   CHOL 141 05/27/2019   HDL 55 05/27/2019   LDLCALC 69 05/27/2019   TRIG 88 05/27/2019   Lab Results  Component Value Date   WBC 6.0 07/09/2018   HGB 13.6 07/09/2018   HCT 39.7 07/09/2018   MCV 89 07/09/2018   PLT 241 07/09/2018   No results found for: IRON, TIBC, FERRITIN  Obesity Behavioral Intervention:   Approximately 15 minutes were spent on the discussion below.  ASK: We discussed the diagnosis of obesity with Crystal Jackson today and Crystal Jackson  agreed to give [name] permission to discuss obesity behavioral modification therapy today.  ASSESS: Crystal Jackson has the diagnosis of obesity and her BMI today is 36.72. Crystal Jackson is in the action stage of change.   ADVISE: Crystal Jackson was educated on the multiple health risks of obesity as well as the benefit of weight loss to improve her health. She was advised of the need for long term treatment and the importance of lifestyle modifications to improve her current health and to decrease her risk of future health problems.  AGREE: Multiple dietary modification options and treatment options were discussed and Crystal Jackson agreed to follow the recommendations documented in the above note.  ARRANGE: Crystal Jackson was educated on the importance of frequent visits to treat obesity as outlined per CMS and USPSTF guidelines and agreed to schedule her next follow up appointment today.  Attestation Statements:   Reviewed by clinician on day of visit: allergies, medications, problem list, medical history, surgical history, family history, social history, and previous encounter notes.   [name], am acting as transcriptionist for [employer], PA-C.  I have reviewed the above documentation for accuracy and completeness, and I agree with the above. [name], PA-C

## 2020-05-18 ENCOUNTER — Ambulatory Visit: Payer: Medicare Other | Attending: Physician Assistant | Admitting: Physical Therapy

## 2020-05-18 ENCOUNTER — Other Ambulatory Visit: Payer: Self-pay

## 2020-05-18 ENCOUNTER — Encounter: Payer: Self-pay | Admitting: Physical Therapy

## 2020-05-18 DIAGNOSIS — R29898 Other symptoms and signs involving the musculoskeletal system: Secondary | ICD-10-CM | POA: Diagnosis present

## 2020-05-18 DIAGNOSIS — M545 Low back pain, unspecified: Secondary | ICD-10-CM | POA: Insufficient documentation

## 2020-05-18 DIAGNOSIS — M25562 Pain in left knee: Secondary | ICD-10-CM | POA: Insufficient documentation

## 2020-05-18 DIAGNOSIS — G8929 Other chronic pain: Secondary | ICD-10-CM | POA: Diagnosis present

## 2020-05-18 DIAGNOSIS — R262 Difficulty in walking, not elsewhere classified: Secondary | ICD-10-CM | POA: Diagnosis present

## 2020-05-18 NOTE — Therapy (Signed)
[facility] [address]  [address] [place], [place], [address] Phone: [phone]   Fax:  [phone]  Physical Therapy Evaluation  Patient Details  Name: Crystal Jackson MRN: [id number] Date of Birth: [birth year] Referring Provider (PT): Richardean Canal, MD   Encounter Date: 05/18/2020   PT End of Session - 05/18/20 1158    Visit Number 1    Number of Visits 7    Date for PT Re-Evaluation 06/29/20    Authorization Type [employer] Medicare    PT Start Time 1103    PT Stop Time 1147    PT Time Calculation (min) 44 min    Activity Tolerance Patient tolerated treatment well    Behavior During Therapy [facility] for tasks assessed/performed           Past Medical History:  Diagnosis Date  . Anxiety   . Arthritis   . Asthma   . Back pain   . Bilateral swelling of feet   . Constipation   . Depression   . Gallbladder problem   . GERD (gastroesophageal reflux disease)   . History of low potassium   . Hypertension   . Joint pain   . Obesity   . Palpitations   . Shortness of breath   . Sleep apnea     Past Surgical History:  Procedure Laterality Date  . ABDOMINAL HYSTERECTOMY    . APPENDECTOMY    . CHOLECYSTECTOMY    . KNEE SURGERY Left   . SHOULDER SURGERY Right     There were no vitals filed for this visit.    Subjective Assessment - 05/18/20 1105    Subjective Patient reports that she received an injection and aspiration to the L knee 3-4 weeks ago, which helped. Pain has been flared up since at least 6 weeks ago without known cause. Pain is located down the L lateral knee to the mid-calf. Denies N/T in his region, does report longstanding N/T in B toes. Worse with getting up out of a chair, walking long distances. Better with injection, heat, pain relievers. Also reports weakness in her legs- thinks she has sarcopenia. Feels like she is not as active as before. Feels like she gets winded with walking around the block- denies  chest pain or pressure. Experiences pain down anterior legs down the lower legs at night. Notes that 3 weeks ago she woke up with back spasms and once this resolved, she started to experience her "old" back pain located over the midline of the spine- worst with standing, better with sitting/rest.    Pertinent History SOB, palpitations, HTN, GERD, depression, B feet swelling, back pain, asthma, anxiety, R shoulder surgery, L knee surgery    Limitations Standing;Walking;House hold activities    How long can you stand comfortably? 10 min    How long can you walk comfortably? 1/3 mile    Diagnostic tests 03/11/19 L knee xray: No acute fracture.  Moderate severe patellofemoral   changes moderate medial joint line narrowing mild lateral joint line   narrowing.  No subluxation dislocation.    Patient Stated Goals decrease pain, increase strength    Currently in Pain? Yes    Pain Score 0-No pain    Pain Location Knee    Pain Orientation Left;Lateral    Pain Descriptors / Indicators Aching    Pain Type Chronic pain    Multiple Pain Sites Yes    Pain Score 5    Pain Location Leg  Pain Orientation Right;Left;Anterior    Pain Descriptors / Indicators Tiring   fatigue   Pain Type Chronic pain    Pain Radiating Towards thighs to shins              [facility] - 05/18/20 1114      Assessment   Medical Diagnosis Primary osteoarthritis of L knee    Referring Provider (PT) Richardean CanalGilbert Clark, MD    Onset Date/Surgical Date 04/06/20    Next MD Visit not scheduled    Prior Therapy yes- for L knee      Precautions   Precautions None      Balance Screen   Has the patient fallen in the past 6 months No    Has the patient had a decrease in activity level because of a fear of falling?  No    Is the patient reluctant to leave their home because of a fear of falling?  No      Home Environment   Living Environment Private residence    Living Arrangements Spouse/significant other    Available  Help at Discharge Family    Type of Home House    Home Access Stairs to enter    Home Layout One level    Home Equipment [place] - single point      Prior Function   Level of Independence Independent    Vocation Retired    Leisure walking, Advice workerhike      Cognition   Overall Cognitive Status Within Functional Limits for tasks assessed      Sensation   Light Touch Appears Intact      Coordination   Gross Motor Movements are Fluid and Coordinated Yes      Posture/Postural Control   Posture/Postural Control Postural limitations    Postural Limitations Rounded Shoulders;Forward head      AROM   Right Knee Extension 0    Right Knee Flexion 123    Left Knee Extension 3    Left Knee Flexion 124      PROM   Right Knee Extension 0    Right Knee Flexion 130    Left Knee Extension 1    Left Knee Flexion 125      Strength   Right Hip Flexion 4+/5    Right Hip Extension 4/5    Right Hip ABduction 4/5    Right Hip ADduction 4/5    Left Hip Flexion 4+/5    Left Hip Extension 4/5    Left Hip ABduction 4/5    Left Hip ADduction 4/5    Right Knee Flexion 4/5    Right Knee Extension 4+/5    Left Knee Flexion 4+/5    Left Knee Extension 4+/5   pain   Right Ankle Dorsiflexion 4+/5    Right Ankle Plantar Flexion 4+/5    Left Ankle Dorsiflexion 4+/5    Left Ankle Plantar Flexion 4+/5      Flexibility   Soft Tissue Assessment /Muscle Length yes    ITB R Ober's positive; L negative      Palpation   Patella mobility B mildly limited M/L, moderately limited superior/inferior; pain on R    Palpation comment TTP over L lateral aspect of knee and patellar tendon; mild edema over L lateral knee      Ambulation/Gait   Assistive device None    Gait Pattern Step-through pattern;Lateral trunk lean to right;Decreased step length - left;Decreased step length - right;Lateral trunk lean to left;Trunk flexed  Ambulation Surface Level;Indoor                      Objective  measurements completed on examination: See above findings.               PT Education - 05/18/20 1158    Education Details prognosis, POC, HEP    Person(s) Educated Patient    Methods Explanation;Demonstration;Tactile cues;Verbal cues;Handout    Comprehension Verbalized understanding            PT Short Term Goals - 05/18/20 1204      PT SHORT TERM GOAL #1   Title Patient to be independent with initial HEP.    Time 3    Period Weeks    Status New    Target Date 06/08/20             PT Long Term Goals - 05/18/20 1204      PT LONG TERM GOAL #1   Title Patient to be independent with advanced HEP.    Time 6    Period Weeks    Status New    Target Date 06/29/20      PT LONG TERM GOAL #2   Title Patient to demonstrate L knee AROM/PROM symmetrical to opposite LE without pain limiting.     Time 6    Period Weeks    Status New    Target Date 06/29/20      PT LONG TERM GOAL #3   Title Patient to demonstrate B LE strength >=4+/5.    Time 6    Period Weeks    Status New    Target Date 06/29/20      PT LONG TERM GOAL #4   Title Patient to report tolerance of walking 2/3 of a mile without excessive pain.    Time 6    Period Weeks    Status New      PT LONG TERM GOAL #5   Title Patient to report completing a 1/2 mile hike in the mountains with good stability and tolerance.    Time 6    Period Weeks    Status New    Target Date 06/29/20      PT LONG TERM GOAL #6   Title Patient to report 75% improvement in knee pain.    Time 6    Period Weeks    Status New    Target Date 06/29/20                  Plan - 05/18/20 1159    Clinical Impression Statement Patient is a 75 y/o F presenting to OPPT with c/o acute on chronic L lateral knee pain for the past 6 weeks. Pain is located down the L lateral knee to the mid-calf. Denies N/T in his region, does report longstanding N/T in B toes. Aggravating factors include prolonged standing and standing up  from a chair. Also reports LE weakness and pain at night. Notes a flare of chronic LBP 3 weeks ago, now located over midline of the lumbar spine and worse with standing. Patient is motivated to increase her strength and endurance as she would like to be able to hike  a mile in the mountains. Patient today presenting with rounded shoulders and forward head posture, limited L knee extension ROM, B patellar hypomobility, B hip weakness, R positive Ober's test, TTP over L lateral aspect of knee and patellar tendon, mild L lateral knee edema, and gait  deviations. Patient was educated on gentle stretching and strengthening HEP- patient reported understanding. Would benefit from skilled PT services 1x/week for 6 weeks to address aforementioned impairments.    Personal Factors and Comorbidities Age;Comorbidity 3+;Fitness;Past/Current Experience;Time since onset of injury/illness/exacerbation    Comorbidities SOB, palpitations, HTN, GERD, depression, B feet swelling, back pain, asthma, anxiety, R shoulder surgery, L knee surgery    Examination-Activity Limitations Sleep;Squat;Bathing;Bed Mobility;Bend;Stairs;Stand;Caring for Others;Carry;Transfers;Dressing;Hygiene/Grooming;Lift;Locomotion Level    Examination-Participation Restrictions Meal Prep;Laundry;Yard Work;Driving;Community Activity;Shop;Cleaning;Church    Stability/Clinical Decision Making Stable/Uncomplicated    Clinical Decision Making Low    Rehab Potential Good    PT Frequency 1x / week    PT Duration 6 weeks    PT Treatment/Interventions ADLs/Self Care Home Management;Cryotherapy;Electrical Stimulation;Iontophoresis 4mg /ml Dexamethasone;Moist Heat;Ultrasound;Gait training;Stair training;Functional mobility training;Therapeutic activities;Therapeutic exercise;Manual techniques;Patient/family education;Neuromuscular re-education;Balance training;Passive range of motion;Dry needling;Energy conservation;Splinting;Vasopneumatic Device;Taping;Traction     PT Next Visit Plan reassess HEP; progress L knee extension ROM, LE strengthening and endurance    Consulted and Agree with Plan of Care Patient           Patient will benefit from skilled therapeutic intervention in order to improve the following deficits and impairments:  Hypomobility,Decreased activity tolerance,Decreased strength,Pain,Difficulty walking,Decreased mobility,Decreased balance,Decreased range of motion,Improper body mechanics,Postural dysfunction,Impaired flexibility,Decreased endurance,Increased edema,Increased fascial restricitons,Increased muscle spasms  Visit Diagnosis: Chronic pain of left knee  Chronic midline low back pain, unspecified whether sciatica present  Difficulty in walking, not elsewhere classified  Other symptoms and signs involving the musculoskeletal system     Problem List Patient Active Problem List   Diagnosis Date Noted  . OSA (obstructive sleep apnea) 09/03/2018  . Mild persistent asthma without complication 09/03/2018  . Severe obesity (BMI >= 40) (HCC) 04/14/2018  . Unilateral primary osteoarthritis, left knee 04/14/2018  . Asthma in adult, mild intermittent, uncomplicated 02/26/2017  . Allergic rhinitis 09/05/2013     11/05/2013, PT, DPT 05/18/20 12:08 PM   [facility] [address]  [address] [place], [place], [place] Phone: [phone]   Fax:  [phone]  Name: Crystal Jackson MRN: Neil Crouch Date of Birth: 06/01/1945

## 2020-05-25 ENCOUNTER — Other Ambulatory Visit: Payer: Self-pay

## 2020-05-25 ENCOUNTER — Other Ambulatory Visit (HOSPITAL_COMMUNITY): Payer: Self-pay

## 2020-05-25 ENCOUNTER — Encounter (INDEPENDENT_AMBULATORY_CARE_PROVIDER_SITE_OTHER): Payer: Self-pay | Admitting: Physician Assistant

## 2020-05-25 ENCOUNTER — Ambulatory Visit (INDEPENDENT_AMBULATORY_CARE_PROVIDER_SITE_OTHER): Payer: Medicare Other | Admitting: Physician Assistant

## 2020-05-25 VITALS — BP 117/72 | HR 72 | Temp 98.2°F | Ht 64.0 in | Wt 218.0 lb

## 2020-05-25 DIAGNOSIS — R7303 Prediabetes: Secondary | ICD-10-CM | POA: Diagnosis not present

## 2020-05-25 DIAGNOSIS — Z6841 Body Mass Index (BMI) 40.0 and over, adult: Secondary | ICD-10-CM

## 2020-05-25 DIAGNOSIS — F3289 Other specified depressive episodes: Secondary | ICD-10-CM

## 2020-05-25 MED ORDER — OZEMPIC (1 MG/DOSE) 4 MG/3ML ~~LOC~~ SOPN
1.0000 mg | PEN_INJECTOR | SUBCUTANEOUS | 0 refills | Status: DC
  Filled 2020-05-25 – 2020-06-03 (×2): qty 3, 28d supply, fill #0

## 2020-05-25 NOTE — Progress Notes (Signed)
Chief Complaint:   OBESITY Crystal Jackson is here to discuss her progress with her obesity treatment plan along with follow-up of her obesity related diagnoses. Crystal Jackson is on the Category 2 Plan and states she is following her eating plan approximately 50% of the time. Crystal Jackson states she is doing physical therapy for 20 minutes 5-6 times per week.  Today's visit was #: 41 Starting weight: 242 lbs Starting date: 07/09/2018 Today's weight: 218 lbs Today's date: 05/25/2020 Total lbs lost to date: 24 Total lbs lost since last in-office visit: 0  Interim History: Crystal Jackson reports that she has been snacking between meals, and she is eating some of the foods her husband has been eating which usually is not a good choice. She was previously on Ozempic, which worked well for her,  but had to stop due to financial reasons. Topamax did not help with cravings or appetite. We will attempt to restart Ozempic (given insurance approval).  Subjective:   1. Pre-diabetes Crystal Jackson endorses polyphagia.  2. Other depression, with emotional eating  Crystal Jackson notes Topamax did not work to help with cravings and impaired her thinking.  Assessment/Plan:   1. Pre-diabetes Crystal Jackson agreed to restart Ozempic 1 mg with a 90 day supply, with no refills. (coupon was given). She will continue to work on weight loss, exercise, and decreasing simple carbohydrates to help decrease the risk of diabetes.   - Semaglutide, 1 MG/DOSE, (OZEMPIC, 1 MG/DOSE,) 4 MG/3ML SOPN; Inject 1 mg into the skin once a week.  Dispense: 9 mL; Refill: 0  2. Other depression, with emotional eating  Behavior modification techniques were discussed today to help Crystal Jackson deal with her emotional/non-hunger eating behaviors. Crystal Jackson will discontinue Topamax. Orders and follow up as documented in patient record.   3. Class 3 severe obesity with serious comorbidity and body mass index (BMI) of 40.0 to 44.9 in adult, unspecified obesity type (HCC) Crystal Jackson  is currently in the action stage of change. As such, her goal is to continue with weight loss efforts. She has agreed to the Category 2 Plan.   Exercise goals: As is.  Behavioral modification strategies: increasing lean protein intake, decreasing simple carbohydrates and meal planning and cooking strategies.  Crystal Jackson has agreed to follow-up with our clinic in 2 to 3 weeks. She was informed of the importance of frequent follow-up visits to maximize her success with intensive lifestyle modifications for her multiple health conditions.   Objective:   Blood pressure 117/72, pulse 72, temperature 98.2 F (36.8 C), height 5\' 4"  (1.626 m), weight 218 lb (98.9 kg), SpO2 99 %. Body mass index is 37.42 kg/m.  General: Cooperative, alert, well developed, in no acute distress. HEENT: Conjunctivae and lids unremarkable. Cardiovascular: Regular rhythm.  Lungs: Normal work of breathing. Neurologic: No focal deficits.   Lab Results  Component Value Date   CREATININE 1.04 (H) 10/19/2019   BUN 24 10/19/2019   NA 132 (L) 10/19/2019   K 3.7 10/19/2019   CL 92 (L) 10/19/2019   CO2 26 10/19/2019   Lab Results  Component Value Date   ALT 25 10/19/2019   AST 21 10/19/2019   ALKPHOS 47 10/19/2019   BILITOT 0.3 10/19/2019   Lab Results  Component Value Date   HGBA1C 5.4 10/19/2019   HGBA1C 5.3 05/27/2019   HGBA1C 5.4 11/10/2018   HGBA1C 5.8 (H) 07/09/2018   Lab Results  Component Value Date   INSULIN 22.6 10/19/2019   INSULIN 16.2 05/27/2019   INSULIN 33.0 (H) 11/10/2018  INSULIN 25.0 (H) 07/09/2018   Lab Results  Component Value Date   TSH 2.210 07/09/2018   Lab Results  Component Value Date   CHOL 141 05/27/2019   HDL 55 05/27/2019   LDLCALC 69 05/27/2019   TRIG 88 05/27/2019   Lab Results  Component Value Date   WBC 6.0 07/09/2018   HGB 13.6 07/09/2018   HCT 39.7 07/09/2018   MCV 89 07/09/2018   PLT 241 07/09/2018   No results found for: IRON, TIBC, FERRITIN  Obesity  Behavioral Intervention:   Approximately 15 minutes were spent on the discussion below.  ASK: We discussed the diagnosis of obesity with Tenecia today and Mahum agreed to give [name] permission to discuss obesity behavioral modification therapy today.  ASSESS: Marlowe has the diagnosis of obesity and her BMI today is 37.4. Britzy is in the action stage of change.   ADVISE: Mally was educated on the multiple health risks of obesity as well as the benefit of weight loss to improve her health. She was advised of the need for long term treatment and the importance of lifestyle modifications to improve her current health and to decrease her risk of future health problems.  AGREE: Multiple dietary modification options and treatment options were discussed and Jaquisha agreed to follow the recommendations documented in the above note.  ARRANGE: Delailah was educated on the importance of frequent visits to treat obesity as outlined per CMS and USPSTF guidelines and agreed to schedule her next follow up appointment today.  Attestation Statements:   Reviewed by clinician on day of visit: allergies, medications, problem list, medical history, surgical history, family history, social history, and previous encounter notes.   [name], am acting as transcriptionist for [employer], PA-C.  I have reviewed the above documentation for accuracy and completeness, and I agree with the above. [name], PA-C

## 2020-05-26 ENCOUNTER — Encounter (INDEPENDENT_AMBULATORY_CARE_PROVIDER_SITE_OTHER): Payer: Self-pay | Admitting: Physician Assistant

## 2020-05-26 ENCOUNTER — Other Ambulatory Visit (HOSPITAL_COMMUNITY): Payer: Self-pay

## 2020-05-26 ENCOUNTER — Ambulatory Visit: Payer: Medicare Other | Admitting: Physical Therapy

## 2020-05-26 ENCOUNTER — Encounter: Payer: Self-pay | Admitting: Physical Therapy

## 2020-05-26 DIAGNOSIS — R29898 Other symptoms and signs involving the musculoskeletal system: Secondary | ICD-10-CM

## 2020-05-26 DIAGNOSIS — G8929 Other chronic pain: Secondary | ICD-10-CM

## 2020-05-26 DIAGNOSIS — M25562 Pain in left knee: Secondary | ICD-10-CM | POA: Diagnosis not present

## 2020-05-26 DIAGNOSIS — R262 Difficulty in walking, not elsewhere classified: Secondary | ICD-10-CM

## 2020-05-26 NOTE — Telephone Encounter (Signed)
Pt last seen by [name] Aguilar, PA-C.  

## 2020-05-26 NOTE — Patient Instructions (Addendum)
    IONTOPHORESIS PATIENT PRECAUTIONS & CONTRAINDICATIONS:  Redness under one or both electrodes can occur.  This characterized by a uniform redness that usually disappears within 12 hours of treatment. Small pinhead size blisters may result in response to the drug.  Contact your physician if the problem persists more than 24 hours. On rare occasions, iontophoresis therapy can result in temporary skin reactions such as rash, inflammation, irritation or burns.  The skin reactions may be the result of individual sensitivity to the ionic solution used, the condition of the skin at the start of treatment, reaction to the materials in the electrodes, allergies or sensitivity to dexamethasone, or a poor connection between the patch and your skin.  Discontinue using iontophoresis if you have any of these reactions and report to your therapist. Remove the Patch or electrodes if you have any undue sensation of pain or burning during the treatment and report discomfort to your therapist. Tell your Therapist if you have had known adverse reactions to the application of electrical current. Approximate treatment time is 4-6 hours.  Remove the patch after 6 hours. The Patch can be worn during normal activity, however excessive motion where the electrodes have been placed can cause poor contact between the skin and the electrode or uneven electrical current resulting in greater risk of skin irritation. Keep out of the reach of children.   DO NOT use if you have a cardiac pacemaker or any other electrically sensitive implanted device. DO NOT use if you have a known sensitivity to dexamethasone. DO NOT use during Magnetic Resonance Imaging (MRI). DO NOT use over broken or compromised skin (e.g. sunburn, cuts, or acne) due to the increased risk of skin reaction. DO NOT SHAVE over the area to be treated:  To establish good contact between the Patch and the skin, excessive hair may be clipped. DO NOT place the  Patch or electrodes on or over your eyes, directly over your heart, or brain. DO NOT reuse the Patch or electrodes as this may cause burns to occur.   For questions, please contact your therapist at:  [facility] [place] [address]  [address] [place], [place], [address] Phone: [phone]   Fax:  [phone]  

## 2020-05-26 NOTE — Therapy (Signed)
[facility] [address]  [address] [place], [place], [address] Phone: [phone]   Fax:  [phone]  Physical Therapy Treatment  Patient Details  Name: Crystal Jackson MRN: [id number] Date of Birth: [birth year] Referring Provider (PT): Richardean Canal, MD   Encounter Date: 05/26/2020   PT End of Session - 05/26/20 1154    Visit Number 2    Number of Visits 7    Date for PT Re-Evaluation 06/29/20    Authorization Type U[employer] Medicare    PT Start Time 1101    PT Stop Time 1149    PT Time Calculation (min) 48 min    Activity Tolerance Patient tolerated treatment well    Behavior During Therapy [facility] for tasks assessed/performed           Past Medical History:  Diagnosis Date  . Anxiety   . Arthritis   . Asthma   . Back pain   . Bilateral swelling of feet   . Constipation   . Depression   . Gallbladder problem   . GERD (gastroesophageal reflux disease)   . History of low potassium   . Hypertension   . Joint pain   . Obesity   . Palpitations   . Shortness of breath   . Sleep apnea     Past Surgical History:  Procedure Laterality Date  . ABDOMINAL HYSTERECTOMY    . APPENDECTOMY    . CHOLECYSTECTOMY    . KNEE SURGERY Left   . SHOULDER SURGERY Right     There were no vitals filed for this visit.   Subjective Assessment - 05/26/20 1102    Subjective "Feeling tired today but I'm going to try." Would like to try to get better at doing her HEP.    Pertinent History SOB, palpitations, HTN, GERD, depression, B feet swelling, back pain, asthma, anxiety, R shoulder surgery, L knee surgery    Diagnostic tests 03/11/19 L knee xray: No acute fracture.  Moderate severe patellofemoral   changes moderate medial joint line narrowing mild lateral joint line   narrowing.  No subluxation dislocation.    Patient Stated Goals decrease pain, increase strength    Currently in Pain? Yes    Pain Score 6     Pain Location Knee    Pain  Orientation Left;Lateral;Right    Pain Descriptors / Indicators Aching    Pain Type Chronic pain              [facility] PT Assessment - 05/26/20 0001      Observation/Other Assessments   Focus on Therapeutic Outcomes (FOTO)  Knee: 30                         [facility] Adult PT Treatment/Exercise - 05/26/20 0001      Exercises   Exercises Knee/Hip      Knee/Hip Exercises: Stretches   Quad Stretch Right;Left;2 reps;30 seconds    Quad Stretch Limitations prone with folded pillow and strap    ITB Stretch Left;2 reps;30 seconds;Right    ITB Stretch Limitations supine with strap   cues for alignment   Other Knee/Hip Stretches R/L sidelying TFL stretch 30"      Knee/Hip Exercises: Aerobic   Nustep L5 x 6 min (UEs/LEs)      Knee/Hip Exercises: Seated   Sit to Sand 1 set;without UE support;5 reps   sitting on airex     Knee/Hip Exercises: Sidelying  Hip ADduction Strengthening;Right;Left;10 reps;2 sets    Hip ADduction Limitations opposite LE elevated on bolster; 2nd set with 1#      Modalities   Modalities Iontophoresis      Iontophoresis   Type of Iontophoresis Dexamethasone    Location L lateral knee    Dose 1.0 ml; 66mA*min    Time 4 hour patch                  PT Education - 05/26/20 1154    Education Details edu on ionto wear time, removal, precaution, benefits; update to HEP    Person(s) Educated Patient    Methods Explanation;Demonstration;Tactile cues;Verbal cues;Handout    Comprehension Verbalized understanding;Returned demonstration            PT Short Term Goals - 05/26/20 1157      PT SHORT TERM GOAL #1   Title Patient to be independent with initial HEP.    Time 3    Period Weeks    Status Achieved    Target Date 06/08/20             PT Long Term Goals - 05/26/20 1157      PT LONG TERM GOAL #1   Title Patient to be independent with advanced HEP.    Time 6    Period Weeks    Status On-going      PT LONG TERM GOAL #2    Title Patient to demonstrate L knee AROM/PROM symmetrical to opposite LE without pain limiting.     Time 6    Period Weeks    Status On-going      PT LONG TERM GOAL #3   Title Patient to demonstrate B LE strength >=4+/5.    Time 6    Period Weeks    Status On-going      PT LONG TERM GOAL #4   Title Patient to report tolerance of walking 2/3 of a mile without excessive pain.    Time 6    Period Weeks    Status On-going      PT LONG TERM GOAL #5   Title Patient to report completing a 1/2 mile [place] the [place] with good stability and tolerance.    Time 6    Period Weeks    Status On-going      PT LONG TERM GOAL #6   Title Patient to report 75% improvement in knee pain.    Time 6    Period Weeks    Status On-going                 Plan - 05/26/20 1155    Clinical Impression Statement Patient reporting fatigue this AM and notes limited compliance with HEP thus far. Reviewed HEP and provided modified version of TFL stretch for max comfort. Patient was able to perform resisted hip adduction with weight, demonstrated slightly limited ROM on R>L. Quad tightness evident today with stretching. Patient remarking on tendency for her to "plop" onto seat when performing STS transfers, thus worked on transfers. Patient was encouraged to activate glutes with concentric and eccentric phases with good effort. Patient reported understanding of edu on ionto benefits, use, wear time, and precautions, thus applied ionto patch to L knee. Patient without complaints at end of session.    Comorbidities SOB, palpitations, HTN, GERD, depression, B feet swelling, back pain, asthma, anxiety, R shoulder surgery, L knee surgery    PT Treatment/Interventions ADLs/Self Care Home Management;Cryotherapy;Electrical Stimulation;Iontophoresis 4mg /ml Dexamethasone;Moist Heat;Ultrasound;Gait  training;Stair training;Functional mobility training;Therapeutic activities;Therapeutic exercise;Manual  techniques;Patient/family education;Neuromuscular re-education;Balance training;Passive range of motion;Dry needling;Energy conservation;Splinting;Vasopneumatic Device;Taping;Traction    PT Next Visit Plan progress L knee extension ROM, LE strengthening and endurance; STS transfers    Consulted and Agree with Plan of Care Patient           Patient will benefit from skilled therapeutic intervention in order to improve the following deficits and impairments:  Hypomobility,Decreased activity tolerance,Decreased strength,Pain,Difficulty walking,Decreased mobility,Decreased balance,Decreased range of motion,Improper body mechanics,Postural dysfunction,Impaired flexibility,Decreased endurance,Increased edema,Increased fascial restricitons,Increased muscle spasms  Visit Diagnosis: Chronic pain of left knee  Chronic midline low back pain, unspecified whether sciatica present  Difficulty in walking, not elsewhere classified  Other symptoms and signs involving the musculoskeletal system     Problem List Patient Active Problem List   Diagnosis Date Noted  . OSA (obstructive sleep apnea) 09/03/2018  . Mild persistent asthma without complication 09/03/2018  . Severe obesity (BMI >= 40) (HCC) 04/14/2018  . Unilateral primary osteoarthritis, left knee 04/14/2018  . Asthma in adult, mild intermittent, uncomplicated 02/26/2017  . Allergic rhinitis 09/05/2013     Anette Guarneri, PT, DPT 05/26/20 11:58 AM   [facility] [address]  [address] [place], [place], [address] Phone: [phone]   Fax:  [phone]  Name: Crystal Jackson MRN: [id number] Date of Birth: 1945/12/17

## 2020-05-31 NOTE — Telephone Encounter (Signed)
Pt last seen by [name] Aguilar, PA-C.  

## 2020-06-01 ENCOUNTER — Ambulatory Visit: Payer: Medicare Other | Attending: Physician Assistant | Admitting: Rehabilitative and Restorative Service Providers"

## 2020-06-01 ENCOUNTER — Other Ambulatory Visit: Payer: Self-pay

## 2020-06-01 ENCOUNTER — Encounter: Payer: Self-pay | Admitting: Rehabilitative and Restorative Service Providers"

## 2020-06-01 DIAGNOSIS — M25561 Pain in right knee: Secondary | ICD-10-CM | POA: Diagnosis present

## 2020-06-01 DIAGNOSIS — M6281 Muscle weakness (generalized): Secondary | ICD-10-CM | POA: Insufficient documentation

## 2020-06-01 DIAGNOSIS — R262 Difficulty in walking, not elsewhere classified: Secondary | ICD-10-CM | POA: Diagnosis present

## 2020-06-01 DIAGNOSIS — M545 Low back pain, unspecified: Secondary | ICD-10-CM | POA: Diagnosis present

## 2020-06-01 DIAGNOSIS — G8929 Other chronic pain: Secondary | ICD-10-CM | POA: Diagnosis present

## 2020-06-01 DIAGNOSIS — R2681 Unsteadiness on feet: Secondary | ICD-10-CM

## 2020-06-01 DIAGNOSIS — M25562 Pain in left knee: Secondary | ICD-10-CM | POA: Diagnosis not present

## 2020-06-01 DIAGNOSIS — R29898 Other symptoms and signs involving the musculoskeletal system: Secondary | ICD-10-CM | POA: Insufficient documentation

## 2020-06-01 NOTE — Therapy (Signed)
[facility] [address]  [address] [place], [place], [address] Phone: [phone]   Fax:  [phone]  Physical Therapy Treatment  Patient Details  Name: Crystal Jackson MRN: [id number] Date of Birth: [birth year] Referring Provider (PT): Richardean Canal, MD   Encounter Date: 06/01/2020   PT End of Session - 06/01/20 1136    Visit Number 3    Number of Visits 7    Date for PT Re-Evaluation 06/29/20    Authorization Type UHC Medicare    PT Start Time 1057    PT Stop Time 1137    PT Time Calculation (min) 40 min    Activity Tolerance Patient tolerated treatment well    Behavior During Therapy [facility] for tasks assessed/performed           Past Medical History:  Diagnosis Date  . Anxiety   . Arthritis   . Asthma   . Back pain   . Bilateral swelling of feet   . Constipation   . Depression   . Gallbladder problem   . GERD (gastroesophageal reflux disease)   . History of low potassium   . Hypertension   . Joint pain   . Obesity   . Palpitations   . Shortness of breath   . Sleep apnea     Past Surgical History:  Procedure Laterality Date  . ABDOMINAL HYSTERECTOMY    . APPENDECTOMY    . CHOLECYSTECTOMY    . KNEE SURGERY Left   . SHOULDER SURGERY Right     There were no vitals filed for this visit.   Subjective Assessment - 06/01/20 1101    Subjective I am doing okay today, but my knees are bothering me.  I am doing better with my HEP.    Pertinent History SOB, palpitations, HTN, GERD, depression, B feet swelling, back pain, asthma, anxiety, R shoulder surgery, L knee surgery    Limitations Standing;Walking;House hold activities    Patient Stated Goals decrease pain, increase strength    Currently in Pain? Yes    Pain Score 5     Pain Location Knee    Pain Orientation Right;Left    Pain Descriptors / Indicators Aching;Squeezing    Pain Type Chronic pain                             [facility]  Adult PT Treatment/Exercise - 06/01/20 0001      Transfers   Five time sit to stand comments  26.8 sec      Knee/Hip Exercises: Aerobic   Nustep L5 x 6 min (UEs/LEs)      Knee/Hip Exercises: Standing   Forward Step Up Both;1 set;10 reps      Knee/Hip Exercises: Seated   Long Arc Quad Strengthening;Both;2 sets;10 reps    Long Arc Quad Weight 2 lbs.    Clamshell with TheraBand Red   2x10   Marching Strengthening;Both;2 sets;10 reps    Marching Weights 2 lbs.    Hamstring Curl Strengthening;Both;2 sets;10 reps    Hamstring Limitations red Tband    Sit to Sand 1 set;without UE support;5 reps      Knee/Hip Exercises: Sidelying   Hip ADduction Strengthening;Right;Left;10 reps;2 sets    Hip ADduction Limitations 2# with opposite LE elevated on bolster for comfort                    PT Short Term Goals -  05/26/20 1157      PT SHORT TERM GOAL #1   Title Patient to be independent with initial HEP.    Time 3    Period Weeks    Status Achieved    Target Date 06/08/20             PT Long Term Goals - 06/01/20 1155      PT LONG TERM GOAL #1   Title Patient to be independent with advanced HEP.    Status On-going      PT LONG TERM GOAL #2   Title Patient to demonstrate L knee AROM/PROM symmetrical to opposite LE without pain limiting.     Status On-going      PT LONG TERM GOAL #3   Title Patient to demonstrate B LE strength >=4+/5.    Status On-going      PT LONG TERM GOAL #4   Title Patient to report tolerance of walking 2/3 of a mile without excessive pain.    Status On-going                 Plan - 06/01/20 1137    Clinical Impression Statement Pt continues to make progress towards goal related activities.  She was able to tolerate increased exercises today and increased strengthening.  She has great attitude throughout session and requires minimal seated recovery periods.  She states that she found her old HEP which includes many of the exercises  performed today.  She requires cuing for decreased speed to optimize muscle contractions.    Comorbidities SOB, palpitations, HTN, GERD, depression, B feet swelling, back pain, asthma, anxiety, R shoulder surgery, L knee surgery    PT Treatment/Interventions ADLs/Self Care Home Management;Cryotherapy;Electrical Stimulation;Iontophoresis 4mg /ml Dexamethasone;Moist Heat;Ultrasound;Gait training;Stair training;Functional mobility training;Therapeutic activities;Therapeutic exercise;Manual techniques;Patient/family education;Neuromuscular re-education;Balance training;Passive range of motion;Dry needling;Energy conservation;Splinting;Vasopneumatic Device;Taping;Traction    PT Next Visit Plan progress L knee extension ROM, LE strengthening and endurance; STS transfers    Consulted and Agree with Plan of Care Patient           Patient will benefit from skilled therapeutic intervention in order to improve the following deficits and impairments:  Hypomobility,Decreased activity tolerance,Decreased strength,Pain,Difficulty walking,Decreased mobility,Decreased balance,Decreased range of motion,Improper body mechanics,Postural dysfunction,Impaired flexibility,Decreased endurance,Increased edema,Increased fascial restricitons,Increased muscle spasms  Visit Diagnosis: Chronic pain of left knee  Chronic midline low back pain, unspecified whether sciatica present  Difficulty in walking, not elsewhere classified  Other symptoms and signs involving the musculoskeletal system  Chronic bilateral low back pain without sciatica  Chronic pain of right knee  Unsteadiness on feet  Muscle weakness (generalized)     Problem List Patient Active Problem List   Diagnosis Date Noted  . OSA (obstructive sleep apnea) 09/03/2018  . Mild persistent asthma without complication 09/03/2018  . Severe obesity (BMI >= 40) (HCC) 04/14/2018  . Unilateral primary osteoarthritis, left knee 04/14/2018  . Asthma in adult,  mild intermittent, uncomplicated 02/26/2017  . Allergic rhinitis 09/05/2013    11/05/2013, PT, DPT 06/01/2020, 11:57 AM  [facility] [address]  [address] [place], [place], [place] Phone: [phone]   Fax:  [phone]  Name: Taralynn Quiett MRN: Neil Crouch Date of Birth: 07-04-1945

## 2020-06-03 ENCOUNTER — Other Ambulatory Visit (HOSPITAL_COMMUNITY): Payer: Self-pay

## 2020-06-08 ENCOUNTER — Encounter (INDEPENDENT_AMBULATORY_CARE_PROVIDER_SITE_OTHER): Payer: Self-pay | Admitting: Physician Assistant

## 2020-06-08 ENCOUNTER — Encounter: Payer: Self-pay | Admitting: Physical Therapy

## 2020-06-08 ENCOUNTER — Other Ambulatory Visit: Payer: Self-pay

## 2020-06-08 ENCOUNTER — Ambulatory Visit: Payer: Medicare Other | Admitting: Physical Therapy

## 2020-06-08 DIAGNOSIS — R29898 Other symptoms and signs involving the musculoskeletal system: Secondary | ICD-10-CM

## 2020-06-08 DIAGNOSIS — M25562 Pain in left knee: Secondary | ICD-10-CM | POA: Diagnosis not present

## 2020-06-08 DIAGNOSIS — R262 Difficulty in walking, not elsewhere classified: Secondary | ICD-10-CM

## 2020-06-08 DIAGNOSIS — M545 Low back pain, unspecified: Secondary | ICD-10-CM

## 2020-06-08 NOTE — Telephone Encounter (Signed)
Please review

## 2020-06-08 NOTE — Therapy (Signed)
[facility] [address]  [address] [place], [place], [address] Phone: [phone]   Fax:  [phone]  Physical Therapy Treatment  Patient Details  Name: Crystal Jackson MRN: [id number] Date of Birth: 01/31/1945 Referring Provider (PT): Richardean Canal, MD   Encounter Date: 06/08/2020   PT End of Session - 06/08/20 1142    Visit Number 4    Number of Visits 7    Date for PT Re-Evaluation 06/29/20    Authorization Type UHC Medicare    PT Start Time 1100    PT Stop Time 1150    PT Time Calculation (min) 50 min    Activity Tolerance Patient tolerated treatment well;Patient limited by pain    Behavior During Therapy [facility] for tasks assessed/performed           Past Medical History:  Diagnosis Date  . Anxiety   . Arthritis   . Asthma   . Back pain   . Bilateral swelling of feet   . Constipation   . Depression   . Gallbladder problem   . GERD (gastroesophageal reflux disease)   . History of low potassium   . Hypertension   . Joint pain   . Obesity   . Palpitations   . Shortness of breath   . Sleep apnea     Past Surgical History:  Procedure Laterality Date  . ABDOMINAL HYSTERECTOMY    . APPENDECTOMY    . CHOLECYSTECTOMY    . KNEE SURGERY Left   . SHOULDER SURGERY Right     There were no vitals filed for this visit.   Subjective Assessment - 06/08/20 1101    Subjective Has had a rough few couple of days. Reports that some of the exercises that were completed last session may have aggravated it. Slightly better with ice and Voltaren. Notes no benefit from ionto patch.    Pertinent History SOB, palpitations, HTN, GERD, depression, B feet swelling, back pain, asthma, anxiety, R shoulder surgery, L knee surgery    Diagnostic tests 03/11/19 L knee xray: No acute fracture.  Moderate severe patellofemoral   changes moderate medial joint line narrowing mild lateral joint line   narrowing.  No subluxation dislocation.     Patient Stated Goals decrease pain, increase strength    Currently in Pain? Yes    Pain Score 6     Pain Location Knee    Pain Orientation Left    Pain Descriptors / Indicators Aching;Squeezing    Pain Type Chronic pain                             [facility] Adult PT Treatment/Exercise - 06/08/20 0001      Knee/Hip Exercises: Stretches   Passive Hamstring Stretch Right;Left;1 rep;30 seconds    Passive Hamstring Stretch Limitations supine with strap    Hip Flexor Stretch Left;2 reps;30 seconds    Hip Flexor Stretch Limitations mod thomas with strap    ITB Stretch Left;2 reps;30 seconds;Right    ITB Stretch Limitations sidelying    Other Knee/Hip Stretches L KTOS with strap 2x30"      Knee/Hip Exercises: Aerobic   Nustep L4 x 6 min (UEs/LEs)      Knee/Hip Exercises: Supine   Quad Sets Strengthening;Left;1 set;5 reps    Quad Sets Limitations 5x5" with 1/2 bolster under knee      Modalities   Modalities Vasopneumatic  Vasopneumatic   Number Minutes Vasopneumatic  10 minutes    Vasopnuematic Location  Knee   L   Vasopneumatic Pressure Low    Vasopneumatic Temperature  coldest      Manual Therapy   Manual Therapy Myofascial release;Soft tissue mobilization;Passive ROM    Manual therapy comments R sidelying    Soft tissue mobilization STM to L TFL, glute med, piriformis    Myofascial Release manual TPR to L glute med, piriformis   several palpable and tender trigger points throughout   Passive ROM L passive fig 4 x30"                  PT Education - 06/08/20 1142    Education Details update to HEP    Person(s) Educated Patient    Methods Explanation;Demonstration;Tactile cues;Verbal cues;Handout    Comprehension Verbalized understanding;Returned demonstration            PT Short Term Goals - 05/26/20 1157      PT SHORT TERM GOAL #1   Title Patient to be independent with initial HEP.    Time 3    Period Weeks    Status Achieved    Target  Date 06/08/20             PT Long Term Goals - 06/01/20 1155      PT LONG TERM GOAL #1   Title Patient to be independent with advanced HEP.    Status On-going      PT LONG TERM GOAL #2   Title Patient to demonstrate L knee AROM/PROM symmetrical to opposite LE without pain limiting.     Status On-going      PT LONG TERM GOAL #3   Title Patient to demonstrate B LE strength >=4+/5.    Status On-going      PT LONG TERM GOAL #4   Title Patient to report tolerance of walking 2/3 of a mile without excessive pain.    Status On-going                 Plan - 06/08/20 1142    Clinical Impression Statement Patient arrived to session with report of increased pain and stiffness in the L knee for the past couple of days. Attributes this to the new exercises performed last session. L medial and lateral knee appeared slightly edematous at beginning of session. Patient noting most pain over the L lateral lower leg and sometimes into the L lateral hip. Addressed this with STM and manual TPR to the L TFL, glute med, piriformis which were tight and TTP. Improved soft tissue restriction palpable after MT. Proceeded with multiple LE stretches to address c/o tightness. Patient demonstrated considerable tightness in L piriformis and hip flexor. Updated HEP with today's stretches that were well-tolerated today- patient reported understanding. Ended session with [place] to L knee for edema and pain relief. No further complaints at end of session.    Comorbidities SOB, palpitations, HTN, GERD, depression, B feet swelling, back pain, asthma, anxiety, R shoulder surgery, L knee surgery    PT Treatment/Interventions ADLs/Self Care Home Management;Cryotherapy;Electrical Stimulation;Iontophoresis 4mg /ml Dexamethasone;Moist Heat;Ultrasound;Gait training;Stair training;Functional mobility training;Therapeutic activities;Therapeutic exercise;Manual techniques;Patient/family education;Neuromuscular  re-education;Balance training;Passive range of motion;Dry needling;Energy conservation;Splinting;Vasopneumatic Device;Taping;Traction    PT Next Visit Plan progress L knee extension ROM, LE strengthening and endurance; STS transfers    Consulted and Agree with Plan of Care Patient           Patient will benefit from skilled therapeutic intervention in  order to improve the following deficits and impairments:  Hypomobility,Decreased activity tolerance,Decreased strength,Pain,Difficulty walking,Decreased mobility,Decreased balance,Decreased range of motion,Improper body mechanics,Postural dysfunction,Impaired flexibility,Decreased endurance,Increased edema,Increased fascial restricitons,Increased muscle spasms  Visit Diagnosis: Chronic pain of left knee  Chronic midline low back pain, unspecified whether sciatica present  Difficulty in walking, not elsewhere classified  Other symptoms and signs involving the musculoskeletal system     Problem List Patient Active Problem List   Diagnosis Date Noted  . OSA (obstructive sleep apnea) 09/03/2018  . Mild persistent asthma without complication 09/03/2018  . Severe obesity (BMI >= 40) (HCC) 04/14/2018  . Unilateral primary osteoarthritis, left knee 04/14/2018  . Asthma in adult, mild intermittent, uncomplicated 02/26/2017  . Allergic rhinitis 09/05/2013    Anette Guarneri, PT, DPT 06/08/20 11:56 AM   [facility] [address]  [address] [place], [place], [address] Phone: [phone]   Fax:  [phone]  Name: Crystal Jackson MRN: [id number] Date of Birth: Mar 08, 1945

## 2020-06-09 NOTE — Telephone Encounter (Signed)
Can you find out if I prescribe a 0.5 pen and directions say 1mg  weekly if they will give her 8 doses for a month instead of 4? She can check with the pharmacist on that. Thanks

## 2020-06-15 ENCOUNTER — Ambulatory Visit (INDEPENDENT_AMBULATORY_CARE_PROVIDER_SITE_OTHER): Payer: Medicare Other | Admitting: Family Medicine

## 2020-06-15 ENCOUNTER — Ambulatory Visit: Payer: Medicare Other

## 2020-06-15 ENCOUNTER — Other Ambulatory Visit: Payer: Self-pay

## 2020-06-15 VITALS — BP 119/73 | HR 72 | Temp 97.9°F | Ht 64.0 in | Wt 219.0 lb

## 2020-06-15 DIAGNOSIS — G8929 Other chronic pain: Secondary | ICD-10-CM

## 2020-06-15 DIAGNOSIS — M25562 Pain in left knee: Secondary | ICD-10-CM | POA: Diagnosis not present

## 2020-06-15 DIAGNOSIS — F3289 Other specified depressive episodes: Secondary | ICD-10-CM

## 2020-06-15 DIAGNOSIS — R7303 Prediabetes: Secondary | ICD-10-CM

## 2020-06-15 DIAGNOSIS — Z6841 Body Mass Index (BMI) 40.0 and over, adult: Secondary | ICD-10-CM

## 2020-06-15 DIAGNOSIS — R262 Difficulty in walking, not elsewhere classified: Secondary | ICD-10-CM

## 2020-06-15 DIAGNOSIS — M545 Low back pain, unspecified: Secondary | ICD-10-CM

## 2020-06-15 DIAGNOSIS — I10 Essential (primary) hypertension: Secondary | ICD-10-CM | POA: Diagnosis not present

## 2020-06-15 DIAGNOSIS — R29898 Other symptoms and signs involving the musculoskeletal system: Secondary | ICD-10-CM

## 2020-06-15 MED ORDER — CHLORTHALIDONE 25 MG PO TABS: ORAL_TABLET | ORAL | 0 refills | Status: DC
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
## 2020-06-15 NOTE — Therapy (Signed)
[facility] [address]  [address] [place], [place], [address] Phone: [phone]   Fax:  [phone]  Physical Therapy Treatment  Patient Details  Name: Crystal Jackson MRN: [id number] Date of Birth: [birth year] Referring Provider (PT): Richardean Canal, MD   Encounter Date: 06/15/2020   PT End of Session - 06/15/20 1146     Visit Number 5    Number of Visits 7    Date for PT Re-Evaluation 06/29/20    Authorization Type [employer] Medicare    PT Start Time 1105    PT Stop Time 1155    PT Time Calculation (min) 50 min    Activity Tolerance Patient tolerated treatment well    Behavior During Therapy WFL for tasks assessed/performed             Past Medical History:  Diagnosis Date   Anxiety    Arthritis    Asthma    Back pain    Bilateral swelling of feet    Constipation    Depression    Gallbladder problem    GERD (gastroesophageal reflux disease)    History of low potassium    Hypertension    Joint pain    Obesity    Palpitations    Shortness of breath    Sleep apnea     Past Surgical History:  Procedure Laterality Date   ABDOMINAL HYSTERECTOMY     APPENDECTOMY     CHOLECYSTECTOMY     KNEE SURGERY Left    SHOULDER SURGERY Right     There were no vitals filed for this visit.   Subjective Assessment - 06/15/20 1108     Subjective Still having pain in her legs.    Pertinent History SOB, palpitations, HTN, GERD, depression, B feet swelling, back pain, asthma, anxiety, R shoulder surgery, L knee surgery    Diagnostic tests 03/11/19 L knee xray: No acute fracture.  Moderate severe patellofemoral   changes moderate medial joint line narrowing mild lateral joint line   narrowing.  No subluxation dislocation.    Patient Stated Goals decrease pain, increase strength    Currently in Pain? Yes    Pain Score 4     Pain Location Leg    Pain Orientation Right;Left    Pain Descriptors / Indicators Aching    Pain  Type Chronic pain                               [facility] Adult PT Treatment/Exercise - 06/15/20 0001       Knee/Hip Exercises: Stretches   Hip Flexor Stretch Left;2 reps;30 seconds    ITB Stretch Left;2 reps;30 seconds    ITB Stretch Limitations supine leg crossed      Knee/Hip Exercises: Aerobic   Recumbent Bike L1x63min      Knee/Hip Exercises: Supine   Short Arc Quad Sets Strengthening;Left;10 reps    Short Arc Quad Sets Limitations with ball squeezing    Bridges Strengthening;Both;10 reps    Straight Leg Raises Strengthening;Left;10 reps    Straight Leg Raises Limitations with QS    Other Supine Knee/Hip Exercises ball squeezing 10x3"      Modalities   Modalities Vasopneumatic      Vasopneumatic   Number Minutes Vasopneumatic  10 minutes    Vasopnuematic Location  Knee    Vasopneumatic Pressure Low    Vasopneumatic Temperature  34  Manual Therapy   Manual Therapy Soft tissue mobilization    Manual therapy comments supine; S/L    Soft tissue mobilization IASTM to L TFL, hip flexors, quads, ITB                      PT Short Term Goals - 05/26/20 1157       PT SHORT TERM GOAL #1   Title Patient to be independent with initial HEP.    Time 3    Period Weeks    Status Achieved    Target Date 06/08/20               PT Long Term Goals - 06/01/20 1155       PT LONG TERM GOAL #1   Title Patient to be independent with advanced HEP.    Status On-going      PT LONG TERM GOAL #2   Title Patient to demonstrate L knee AROM/PROM symmetrical to opposite LE without pain limiting.     Status On-going      PT LONG TERM GOAL #3   Title Patient to demonstrate B LE strength >=4+/5.    Status On-going      PT LONG TERM GOAL #4   Title Patient to report tolerance of walking 2/3 of a mile without excessive pain.    Status On-going                   Plan - 06/15/20 1149     Clinical Impression Statement Started with IASTM  d/t patient c/o a taut band along her rectuc femoris muscle, mutilple tender point found along the hip flexors and ITB but was addressed during MT. She showed difficulty with the SLRs, bridges, and SAQ with ball squeezing. Cues were required during all exercises for technique and to properly isolate the targeted muscles. Instructed her on use of a rolling stick to address tightness along her anterolateral thigh for at home to which she verbalized understanding. GR post session to address pain and swelling in the L knee. Pt responded well.    Personal Factors and Comorbidities Age;Comorbidity 3+;Fitness;Past/Current Experience;Time since onset of injury/illness/exacerbation    Comorbidities SOB, palpitations, HTN, GERD, depression, B feet swelling, back pain, asthma, anxiety, R shoulder surgery, L knee surgery    PT Frequency 1x / week    PT Duration 6 weeks    PT Treatment/Interventions ADLs/Self Care Home Management;Cryotherapy;Electrical Stimulation;Iontophoresis 4mg /ml Dexamethasone;Moist Heat;Ultrasound;Gait training;Stair training;Functional mobility training;Therapeutic activities;Therapeutic exercise;Manual techniques;Patient/family education;Neuromuscular re-education;Balance training;Passive range of motion;Dry needling;Energy conservation;Splinting;Vasopneumatic Device;Taping;Traction    PT Next Visit Plan progress L knee extension ROM, LE strengthening and endurance; STS transfers    Consulted and Agree with Plan of Care Patient             Patient will benefit from skilled therapeutic intervention in order to improve the following deficits and impairments:  Hypomobility, Decreased activity tolerance, Decreased strength, Pain, Difficulty walking, Decreased mobility, Decreased balance, Decreased range of motion, Improper body mechanics, Postural dysfunction, Impaired flexibility, Decreased endurance, Increased edema, Increased fascial restricitons, Increased muscle spasms  Visit  Diagnosis: Chronic pain of left knee  Chronic midline low back pain, unspecified whether sciatica present  Difficulty in walking, not elsewhere classified  Other symptoms and signs involving the musculoskeletal system     Problem List Patient Active Problem List   Diagnosis Date Noted   OSA (obstructive sleep apnea) 09/03/2018   Mild persistent asthma without complication 09/03/2018   Severe  obesity (BMI >= 40) (HCC) 04/14/2018   Unilateral primary osteoarthritis, left knee 04/14/2018   Asthma in adult, mild intermittent, uncomplicated 02/26/2017   Allergic rhinitis 09/05/2013    Darleene Cleaver, PTA 06/15/2020, 12:37 PM  [facility] [address]  [address] [place], [place], [address] Phone: [phone]   Fax:  [phone]  Name: Crystal Jackson MRN: [id number] Date of Birth: [birth year]

## 2020-06-20 ENCOUNTER — Telehealth (INDEPENDENT_AMBULATORY_CARE_PROVIDER_SITE_OTHER): Payer: Medicare Other | Admitting: Psychology

## 2020-06-20 DIAGNOSIS — F330 Major depressive disorder, recurrent, mild: Secondary | ICD-10-CM | POA: Diagnosis not present

## 2020-06-20 DIAGNOSIS — F5089 Other specified eating disorder: Secondary | ICD-10-CM | POA: Diagnosis not present

## 2020-06-21 NOTE — Progress Notes (Signed)
Office: [phone]  /  Fax: [phone]    Date: July 05, 2020   Appointment Start Time: 10:02am Duration: 32 minutes Provider: Lawerance Cruel, Psy.D. Type of Session: Individual Therapy  Location of Patient: Home Location of Provider: Provider's home (private office) Type of Contact: Telepsychological Visit via MyChart Video Visit  Session Content: Kewanna is a 75 y.o. female presenting for a follow-up appointment to address the previously established treatment goal of increasing coping skills. Today's appointment was a telepsychological visit due to COVID-19. Jewelene provided verbal consent for today's telepsychological appointment and she is aware she is responsible for securing confidentiality on her end of the session. Prior to proceeding with today's appointment, Katee's physical location at the time of this appointment was obtained as well a phone number she could be reached at in the event of technical difficulties. Wafa and this provider participated in today's telepsychological service.   This provider conducted a brief check-in. Ellery shared she continues to be busy with frequent appointments. She discussed challenges using the previously shared exercise as she is frequently thinking about what needs to be done. Notably, Shaleena reported it was beneficial when she did remember to engage in the shared exercise. She noted a plan to put reminders throughout the house to engage in the exercise. Positive reinforcement was provided. Additionally, Larua recalled on the days she was "living in [her] mind," she "got off plan [referring to eating habits]." As such, today's appointment focused on coping with identified stressors. Psychoeducation provided regarding radical acceptance. Discussed various coping strategies involving the senses to help with unpleasant emotions and adapting a radical acceptance attitude. This provider and Krislynn discussed longer-term therapeutic services again.  She discussed she wants to focus on "me time" and is worried about having more appointments. This was further explored and processed. She was receptive to this provider placing a referral with  Behavioral Medicine to address ongoing stressors and symptoms of depression Dwight was receptive to today's appointment as evidenced by openness to sharing, responsiveness to feedback, and willingness to engage in learned skills.  Mental Status Examination:  Appearance: well groomed and appropriate hygiene  Behavior: appropriate to circumstances Mood: sad Affect: mood congruent; tearful at times Speech: normal in rate, volume, and tone Eye Contact: appropriate Psychomotor Activity: unable to assess Gait: unable to assess Thought Process: linear, logical, and goal directed  Thought Content/Perception: no hallucinations, delusions, bizarre thinking or behavior reported or observed and no evidence or endorsement of suicidal and homicidal ideation, plan, and intent Orientation: time, person, place, and purpose of appointment Memory/Concentration: memory, attention, language, and fund of knowledge intact  Insight/Judgment: fair  Interventions:  Conducted a brief chart review Provided empathic reflections and validation Reviewed content from the previous session Provided positive reinforcement Employed supportive psychotherapy interventions to facilitate reduced distress, and to improve coping skills with identified stressors Psychoeducation provided regarding radical acceptance   DSM-5 Diagnosis(es):  F50.89 Other Specified Feeding or Eating Disorder, Emotional Eating Behaviors and F33.0 Major Depressive Disorder, Recurrent Episode, Mild  Treatment Goal & Progress: During the initial appointment with this provider, the following treatment goal was established: increase coping skills. Adryana has demonstrated progress in her goal as evidenced by increased awareness of hunger patterns and increased  awareness of triggers for emotional eating behaviors. Sherin also continues to demonstrate willingness to engage in learned skill(s).  Plan: The next appointment will be scheduled in approximately three weeks, which will be via MyChart Video Visit. The next session will focus on working towards the  established treatment goal. Additionally, a referral will be placed with [facility].

## 2020-06-22 ENCOUNTER — Ambulatory Visit: Payer: Medicare Other

## 2020-06-22 ENCOUNTER — Other Ambulatory Visit: Payer: Self-pay

## 2020-06-22 DIAGNOSIS — G8929 Other chronic pain: Secondary | ICD-10-CM

## 2020-06-22 DIAGNOSIS — M25562 Pain in left knee: Secondary | ICD-10-CM

## 2020-06-22 DIAGNOSIS — R262 Difficulty in walking, not elsewhere classified: Secondary | ICD-10-CM

## 2020-06-22 DIAGNOSIS — R29898 Other symptoms and signs involving the musculoskeletal system: Secondary | ICD-10-CM

## 2020-06-22 NOTE — Therapy (Signed)
[facility] [address]  [address] [place], [place], [address] Phone: [phone]   Fax:  [phone]  Physical Therapy Treatment  Patient Details  Name: Crystal Jackson MRN: [id number] Date of Birth: January 03, 1945 Referring Provider (PT): Richardean Canal, MD   Encounter Date: 06/22/2020   PT End of Session - 06/22/20 1151     Visit Number 6    Number of Visits 7    Date for PT Re-Evaluation 06/29/20    Authorization Type [employer] Medicare    PT Start Time 1108    PT Stop Time 1200    PT Time Calculation (min) 52 min    Activity Tolerance Patient tolerated treatment well    Behavior During Therapy WFL for tasks assessed/performed             Past Medical History:  Diagnosis Date   Anxiety    Arthritis    Asthma    Back pain    Bilateral swelling of feet    Constipation    Depression    Gallbladder problem    GERD (gastroesophageal reflux disease)    History of low potassium    Hypertension    Joint pain    Obesity    Palpitations    Shortness of breath    Sleep apnea     Past Surgical History:  Procedure Laterality Date   ABDOMINAL HYSTERECTOMY     APPENDECTOMY     CHOLECYSTECTOMY     KNEE SURGERY Left    SHOULDER SURGERY Right     There were no vitals filed for this visit.   Subjective Assessment - 06/22/20 1114     Subjective Pt reports being busy going with her husband to doctors appointments.    Pertinent History SOB, palpitations, HTN, GERD, depression, B feet swelling, back pain, asthma, anxiety, R shoulder surgery, L knee surgery    Diagnostic tests 03/11/19 L knee xray: No acute fracture.  Moderate severe patellofemoral   changes moderate medial joint line narrowing mild lateral joint line   narrowing.  No subluxation dislocation.    Patient Stated Goals decrease pain, increase strength    Currently in Pain? Yes    Pain Score 4     Pain Location Leg    Pain Orientation Right;Left    Pain  Descriptors / Indicators Aching    Pain Type Chronic pain                               [facility] Adult PT Treatment/Exercise - 06/22/20 0001       Knee/Hip Exercises: Stretches   Hip Flexor Stretch Left;30 seconds    Hip Flexor Stretch Limitations mod thomas with strap      Knee/Hip Exercises: Aerobic   Nustep L4 x 6 min (UEs/LEs)      Knee/Hip Exercises: Standing   Hip Abduction Stengthening;Both;10 reps;Knee straight    Abduction Limitations 2# weight above ankles; counter support    Forward Step Up Left;10 reps;Hand Hold: 2;Step Height: 4"    Forward Step Up Limitations ski pole assist    Step Down Right;10 reps;Hand Hold: 2;Step Height: 4"    Step Down Limitations for eccentric control of quads      Knee/Hip Exercises: Supine   Heel Slides Strengthening;Left;10 reps    Heel Slides Limitations with 2# weight    Straight Leg Raises Strengthening;Left;2 sets;5 reps    Straight Leg Raises  Limitations with QS; 2# weight      Modalities   Modalities Vasopneumatic      Vasopneumatic   Number Minutes Vasopneumatic  10 minutes    Vasopnuematic Location  Knee    Vasopneumatic Pressure Low    Vasopneumatic Temperature  34                      PT Short Term Goals - 05/26/20 1157       PT SHORT TERM GOAL #1   Title Patient to be independent with initial HEP.    Time 3    Period Weeks    Status Achieved    Target Date 06/08/20               PT Long Term Goals - 06/22/20 1119       PT LONG TERM GOAL #1   Title Patient to be independent with advanced HEP.    Status On-going      PT LONG TERM GOAL #2   Title Patient to demonstrate L knee AROM/PROM symmetrical to opposite LE without pain limiting.     Status On-going      PT LONG TERM GOAL #3   Title Patient to demonstrate B LE strength >=4+/5.    Status On-going      PT LONG TERM GOAL #4   Title Patient to report tolerance of walking 2/3 of a mile without excessive pain.     Status On-going   walks 1/2 of a mile but limited by low endurance                  Plan - 06/22/20 1153     Clinical Impression Statement Pt responded fairly. Introduced stairs leading with the L LE today, she demonstrated mild hesitancy initially but decent stability afterward. She reports that she is able to walk for 1/2 of a mild before requiring rest but limited by endurance. The weighted heel slides reportedly increased pulling in her knee but was tolerable. Otherwise no complaints with the other exercises but intermitten cues required for form.  She does note improvement so far with PT and no concerns about her HEP. She is progressing toward LTG 4 and would continue to benefit from WB strengthening.    Personal Factors and Comorbidities Age;Comorbidity 3+;Fitness;Past/Current Experience;Time since onset of injury/illness/exacerbation    Comorbidities SOB, palpitations, HTN, GERD, depression, B feet swelling, back pain, asthma, anxiety, R shoulder surgery, L knee surgery    PT Frequency 1x / week    PT Duration 6 weeks    PT Treatment/Interventions ADLs/Self Care Home Management;Cryotherapy;Electrical Stimulation;Iontophoresis 4mg /ml Dexamethasone;Moist Heat;Ultrasound;Gait training;Stair training;Functional mobility training;Therapeutic activities;Therapeutic exercise;Manual techniques;Patient/family education;Neuromuscular re-education;Balance training;Passive range of motion;Dry needling;Energy conservation;Splinting;Vasopneumatic Device;Taping;Traction    PT Next Visit Plan closed chain quad strength and endurance; STS transfers    Consulted and Agree with Plan of Care Patient             Patient will benefit from skilled therapeutic intervention in order to improve the following deficits and impairments:  Hypomobility, Decreased activity tolerance, Decreased strength, Pain, Difficulty walking, Decreased mobility, Decreased balance, Decreased range of motion, Improper body  mechanics, Postural dysfunction, Impaired flexibility, Decreased endurance, Increased edema, Increased fascial restricitons, Increased muscle spasms  Visit Diagnosis: Chronic pain of left knee  Chronic midline low back pain, unspecified whether sciatica present  Difficulty in walking, not elsewhere classified  Other symptoms and signs involving the musculoskeletal system     Problem List  Patient Active Problem List   Diagnosis Date Noted   OSA (obstructive sleep apnea) 09/03/2018   Mild persistent asthma without complication 09/03/2018   Severe obesity (BMI >= 40) (HCC) 04/14/2018   Unilateral primary osteoarthritis, left knee 04/14/2018   Asthma in adult, mild intermittent, uncomplicated 02/26/2017   Allergic rhinitis 09/05/2013    Darleene Cleaver, PTA 06/22/2020, 12:11 PM  [facility] [address]  [address] [place], [place], [address] Phone: [phone]   Fax:  [phone]  Name: Crystal Jackson MRN: [id number] Date of Birth: 1945/04/18

## 2020-06-27 ENCOUNTER — Ambulatory Visit: Payer: Medicare Other | Admitting: Physical Therapy

## 2020-06-27 NOTE — Progress Notes (Signed)
Chief Complaint:   OBESITY Crystal Jackson is here to discuss her progress with her obesity treatment plan along with follow-up of her obesity related diagnoses.   Today's visit was #: 42 Starting weight: 242 lbs Starting date: 07/09/2018 Today's weight: 219 lbs Today's date: 06/15/2020 Weight change since last visit: +1 lb Total lbs lost to date: 23 lbs Body mass index is 37.59 kg/m.  Total weight loss percentage to date: -9.50%  Interim History:  Crystal Jackson says it is difficult to stay on plan, especially at lunch and dinner.  She is following it about 25% of the time.  Having some emotional eating episodes.  Plan:  Switch lunch and dinner meals.  Try to focus on mindful eating.  Follow-up with Dr. Dewaine Conger for emotional eating counseling.  Current Meal Plan: the Category 2 Plan for 25% of the time.  Current Exercise Plan: PT for 45 minutes 3 times per week. Current Anti-Obesity Medications: Ozempic 1 mg subcutaneously weekly. Side effects: None.  Assessment/Plan:   Medications Discontinued During This Encounter  Medication Reason   chlorthalidone (HYGROTON) 25 MG tablet Reorder   Meds ordered this encounter  Medications   chlorthalidone (HYGROTON) 25 MG tablet    Sig: Take 1/2 tab daily    Dispense:  45 tablet    Refill:  0   1. Prediabetes Not at goal. Goal is HgbA1c < 5.7.  Medication: Ozempic 1 mg subcutaneously weekly.  Started on Ozempic last office visit.  Covered by insurance, but she is in the donut hole and cannot afford it.  Plan:  Gave handout on metformin.  She will think about it and discuss it with Crystal Jackson at her next office visit.  She denies history of renal issues.  She will continue to focus on protein-rich, low simple carbohydrate foods. We reviewed the importance of hydration, regular exercise for stress reduction, and restorative sleep.   Lab Results  Component Value Date   HGBA1C 5.4 10/19/2019   Lab Results  Component Value Date   INSULIN 22.6  10/19/2019   INSULIN 16.2 05/27/2019   INSULIN 33.0 (H) 11/10/2018   INSULIN 25.0 (H) 07/09/2018   2. Essential hypertension At goal. Medications: chlorthalidone 12.5 mg daily, metoprolol 25-50 twice daily.  At home, blood pressures run 120s-130s/70s.  No symptoms or concerns.  Plan:  Will refill chlorthalidone today.  Avoid buying foods that are: processed, frozen, or prepackaged to avoid excess salt. We will watch for signs of hypotension as she continues lifestyle modifications. We will continue to monitor closely alongside her PCP and/or Specialist.  Regular follow up with PCP and specialists was also encouraged.   BP Readings from Last 3 Encounters:  06/15/20 119/73  05/25/20 117/72  05/09/20 111/72   Lab Results  Component Value Date   CREATININE 1.04 (H) 10/19/2019   - Refill chlorthalidone (HYGROTON) 25 MG tablet; Take 1/2 tab daily  Dispense: 45 tablet; Refill: 0  3. Other depression with emotional eating Controlled. Medication: Ozempic 1 mg subcutaneously weekly.  Changed last office visit from Topamax to Ozempic for emotional eating.  Denies feeling depressed per se.  Plan:  she will make a follow-up with Dr. Dewaine Conger.  Handouts given on emotional eating strategies after discussion with patient. Behavior modification techniques were discussed today to help deal with emotional/non-hunger eating behaviors.  4. Obesity BMI today 37  Course: Crystal Jackson is currently in the action stage of change. As such, her goal is to continue with weight loss efforts.   Nutrition goals:  She has agreed to the Category 2 Plan.   Exercise goals: All adults should avoid inactivity. Some physical activity is better than none, and adults who participate in any amount of physical activity gain some health benefits. Beyond PT.  Behavioral modification strategies: increasing lean protein intake, decreasing simple carbohydrates, no skipping meals, keeping healthy foods in the home, emotional eating  strategies, avoiding temptations, and planning for success.  Crystal Jackson has agreed to follow-up with our clinic in 2-3 weeks. She was informed of the importance of frequent follow-up visits to maximize her success with intensive lifestyle modifications for her multiple health conditions.   Objective:   Blood pressure 119/73, pulse 72, temperature 97.9 F (36.6 C), height 5\' 4"  (1.626 m), weight 219 lb (99.3 kg), SpO2 97 %. Body mass index is 37.59 kg/m.  General: Cooperative, alert, well developed, in no acute distress. HEENT: Conjunctivae and lids unremarkable. Cardiovascular: Regular rhythm.  Lungs: Normal work of breathing. Neurologic: No focal deficits.   Lab Results  Component Value Date   CREATININE 1.04 (H) 10/19/2019   BUN 24 10/19/2019   NA 132 (L) 10/19/2019   K 3.7 10/19/2019   CL 92 (L) 10/19/2019   CO2 26 10/19/2019   Lab Results  Component Value Date   ALT 25 10/19/2019   AST 21 10/19/2019   ALKPHOS 47 10/19/2019   BILITOT 0.3 10/19/2019   Lab Results  Component Value Date   HGBA1C 5.4 10/19/2019   HGBA1C 5.3 05/27/2019   HGBA1C 5.4 11/10/2018   HGBA1C 5.8 (H) 07/09/2018   Lab Results  Component Value Date   INSULIN 22.6 10/19/2019   INSULIN 16.2 05/27/2019   INSULIN 33.0 (H) 11/10/2018   INSULIN 25.0 (H) 07/09/2018   Lab Results  Component Value Date   TSH 2.210 07/09/2018   Lab Results  Component Value Date   CHOL 141 05/27/2019   HDL 55 05/27/2019   LDLCALC 69 05/27/2019   TRIG 88 05/27/2019   Lab Results  Component Value Date   WBC 6.0 07/09/2018   HGB 13.6 07/09/2018   HCT 39.7 07/09/2018   MCV 89 07/09/2018   PLT 241 07/09/2018   Obesity Behavioral Intervention:   Approximately 15 minutes were spent on the discussion below.  ASK: We discussed the diagnosis of obesity with Crystal Jackson today and Crystal Jackson agreed to give Crystal Jackson permission to discuss obesity behavioral modification therapy today.  ASSESS: Crystal Jackson has the diagnosis of  obesity and her BMI today is 37.7. Crystal Jackson is in the action stage of change.   ADVISE: Crystal Jackson was educated on the multiple health risks of obesity as well as the benefit of weight loss to improve her health. She was advised of the need for long term treatment and the importance of lifestyle modifications to improve her current health and to decrease her risk of future health problems.  AGREE: Multiple dietary modification options and treatment options were discussed and Crystal Jackson agreed to follow the recommendations documented in the above note.  ARRANGE: Crystal Jackson was educated on the importance of frequent visits to treat obesity as outlined per CMS and USPSTF guidelines and agreed to schedule her next follow up appointment today.  Attestation Statements:   Reviewed by clinician on day of visit: allergies, medications, problem list, medical history, surgical history, family history, social history, and previous encounter notes.  I, Crystal Jackson, CMA, am acting as Insurance claims handler for Energy manager, DO.  I have reviewed the above documentation for accuracy and completeness, and I agree with the above. -  Marjory Sneddon, D.O.  The [place] was signed into law in 2016 which includes the topic of electronic health records.  This provides immediate access to information in MyChart.  This includes consultation notes, operative notes, office notes, lab results and pathology reports.  If you have any questions about what you read please let us know at your next visit so we can discuss your concerns and take corrective action if need be.  We are right here with you.

## 2020-06-29 ENCOUNTER — Other Ambulatory Visit (INDEPENDENT_AMBULATORY_CARE_PROVIDER_SITE_OTHER): Payer: Self-pay | Admitting: Physician Assistant

## 2020-06-29 ENCOUNTER — Other Ambulatory Visit: Payer: Self-pay

## 2020-06-29 ENCOUNTER — Ambulatory Visit (INDEPENDENT_AMBULATORY_CARE_PROVIDER_SITE_OTHER): Payer: Medicare Other | Admitting: Physician Assistant

## 2020-06-29 ENCOUNTER — Encounter (INDEPENDENT_AMBULATORY_CARE_PROVIDER_SITE_OTHER): Payer: Self-pay | Admitting: Physician Assistant

## 2020-06-29 ENCOUNTER — Other Ambulatory Visit (HOSPITAL_COMMUNITY): Payer: Self-pay

## 2020-06-29 VITALS — BP 113/73 | HR 81 | Temp 97.5°F | Ht 64.0 in | Wt 215.0 lb

## 2020-06-29 DIAGNOSIS — E559 Vitamin D deficiency, unspecified: Secondary | ICD-10-CM | POA: Diagnosis not present

## 2020-06-29 DIAGNOSIS — Z6836 Body mass index (BMI) 36.0-36.9, adult: Secondary | ICD-10-CM

## 2020-06-29 DIAGNOSIS — R7303 Prediabetes: Secondary | ICD-10-CM

## 2020-06-29 MED ORDER — OZEMPIC (0.25 OR 0.5 MG/DOSE) 2 MG/1.5ML ~~LOC~~ SOPN
0.5000 mg | PEN_INJECTOR | SUBCUTANEOUS | 0 refills | Status: DC
  Filled 2020-06-29: qty 3, 56d supply, fill #0
  Filled 2020-06-30: qty 1.5, 28d supply, fill #0

## 2020-06-29 MED ORDER — METFORMIN HCL 500 MG PO TABS: ORAL_TABLET | ORAL | 0 refills | Status: DC

## 2020-06-29 NOTE — Telephone Encounter (Signed)
Last seen [name] 

## 2020-06-30 ENCOUNTER — Ambulatory Visit: Payer: Medicare Other | Admitting: Physical Therapy

## 2020-06-30 ENCOUNTER — Other Ambulatory Visit (HOSPITAL_COMMUNITY): Payer: Self-pay

## 2020-06-30 ENCOUNTER — Encounter (INDEPENDENT_AMBULATORY_CARE_PROVIDER_SITE_OTHER): Payer: Self-pay | Admitting: Physician Assistant

## 2020-06-30 DIAGNOSIS — M25562 Pain in left knee: Secondary | ICD-10-CM

## 2020-06-30 DIAGNOSIS — M545 Low back pain, unspecified: Secondary | ICD-10-CM

## 2020-06-30 DIAGNOSIS — R262 Difficulty in walking, not elsewhere classified: Secondary | ICD-10-CM

## 2020-06-30 DIAGNOSIS — G8929 Other chronic pain: Secondary | ICD-10-CM

## 2020-06-30 DIAGNOSIS — R29898 Other symptoms and signs involving the musculoskeletal system: Secondary | ICD-10-CM

## 2020-06-30 LAB — COMPREHENSIVE METABOLIC PANEL
ALT: 29 IU/L (ref 0–32)
AST: 28 IU/L (ref 0–40)
Albumin/Globulin Ratio: 1.7 (ref 1.2–2.2)
Albumin: 4.5 g/dL (ref 3.7–4.7)
Alkaline Phosphatase: 49 IU/L (ref 44–121)
BUN/Creatinine Ratio: 17 (ref 12–28)
BUN: 16 mg/dL (ref 8–27)
Bilirubin Total: 0.3 mg/dL (ref 0.0–1.2)
CO2: 27 mmol/L (ref 20–29)
Calcium: 9.4 mg/dL (ref 8.7–10.3)
Chloride: 94 mmol/L — ABNORMAL LOW (ref 96–106)
Creatinine, Ser: 0.92 mg/dL (ref 0.57–1.00)
Globulin, Total: 2.6 g/dL (ref 1.5–4.5)
Glucose: 106 mg/dL — ABNORMAL HIGH (ref 65–99)
Potassium: 4.2 mmol/L (ref 3.5–5.2)
Sodium: 135 mmol/L (ref 134–144)
Total Protein: 7.1 g/dL (ref 6.0–8.5)
eGFR: 66 mL/min/{1.73_m2} (ref >59)

## 2020-06-30 LAB — HEMOGLOBIN A1C
Est. average glucose Bld gHb Est-mCnc: 114 mg/dL
Hgb A1c MFr Bld: 5.6 % (ref 4.8–5.6)

## 2020-06-30 LAB — VITAMIN D 25 HYDROXY (VIT D DEFICIENCY, FRACTURES): Vit D, 25-Hydroxy: 44.7 ng/mL (ref 30.0–100.0)

## 2020-06-30 LAB — INSULIN, RANDOM: INSULIN: 17.7 u[IU]/mL (ref 2.6–24.9)

## 2020-06-30 NOTE — Therapy (Signed)
[facility] [address]  [place], [place], [address] Phone: [phone]   Fax:  [phone]  Physical Therapy Discharge Summary  Patient Details  Name: Crystal Jackson MRN: [id number] Date of Birth: 06/16/1945 Referring Provider (PT): Erskine Emery, MD  Progress Note Reporting Period 05/18/20 to 06/30/20  See note below for Objective Data and Assessment of Progress/Goals.      Encounter Date: 06/30/2020   PT End of Session - 06/30/20 1612     Visit Number 7    Number of Visits 7    Date for PT Re-Evaluation 06/29/20    Authorization Type [employer] Medicare    PT Start Time 1532    PT Stop Time 1610    PT Time Calculation (min) 38 min    Activity Tolerance Patient tolerated treatment well    Behavior During Therapy WFL for tasks assessed/performed             Past Medical History:  Diagnosis Date   Anxiety    Arthritis    Asthma    Back pain    Bilateral swelling of feet    Constipation    Depression    Gallbladder problem    GERD (gastroesophageal reflux disease)    History of low potassium    Hypertension    Joint pain    Obesity    Palpitations    Shortness of breath    Sleep apnea     Past Surgical History:  Procedure Laterality Date   ABDOMINAL HYSTERECTOMY     APPENDECTOMY     CHOLECYSTECTOMY     KNEE SURGERY Left    SHOULDER SURGERY Right     There were no vitals filed for this visit.   Subjective Assessment - 06/30/20 1533     Subjective Would like to wrap up with PT today d/t having her plate full lately. Feels that she can tell a difference since doing her stretches d/t her muscles being knotted up.    Pertinent History SOB, palpitations, HTN, GERD, depression, B feet swelling, back pain, asthma, anxiety, R shoulder surgery, L knee surgery    Diagnostic tests 03/11/19 L knee xray: No acute fracture.  Moderate severe patellofemoral   changes moderate medial joint line narrowing  mild lateral joint line   narrowing.  No subluxation dislocation.    Patient Stated Goals decrease pain, increase strength    Currently in Pain? Yes    Pain Score 4     Pain Location Knee    Pain Orientation Right;Left    Pain Descriptors / Indicators Tender    Pain Type Chronic pain                [facility] PT Assessment - 06/30/20 0001       Observation/Other Assessments   Focus on Therapeutic Outcomes (FOTO)  Knee: 57      AROM   Right Knee Extension 0    Right Knee Flexion 125    Left Knee Extension 1    Left Knee Flexion 123      PROM   Right Knee Extension 0    Right Knee Flexion 130    Left Knee Extension 1    Left Knee Flexion 125      Strength   Right Hip Flexion 4+/5    Right Hip Extension 4+/5    Right Hip ABduction 4+/5    Right Hip ADduction 4+/5    Left Hip Flexion 4/5  Left Hip Extension 4+/5    Left Hip ABduction 4/5    Left Hip ADduction 4+/5    Right Knee Flexion 4+/5    Right Knee Extension 4+/5    Left Knee Flexion 4+/5    Left Knee Extension 4+/5    Right Ankle Dorsiflexion 4+/5    Right Ankle Plantar Flexion 4+/5    Left Ankle Dorsiflexion 4+/5    Left Ankle Plantar Flexion 4+/5                           [facility] Adult PT Treatment/Exercise - 06/30/20 0001       Knee/Hip Exercises: Aerobic   Nustep L5 x 6 min (UEs/LEs)      Knee/Hip Exercises: Standing   Hip Abduction Stengthening;Both;10 reps;Knee straight    Abduction Limitations yellow TB    Hip Extension Stengthening;Right;Left;1 set;Knee straight    Extension Limitations yellow TB                    PT Education - 06/30/20 1611     Education Details update/consolidation to HEP; edu on benefits of aquatic therapy or swimming and provided info on [employer] PT and gym    Person(s) Educated Patient    Methods Explanation;Demonstration;Tactile cues;Verbal cues;Handout    Comprehension Verbalized understanding;Returned demonstration               PT Short Term Goals - 06/30/20 1621       PT SHORT TERM GOAL #1   Title Patient to be independent with initial HEP.    Time 3    Period Weeks    Status Achieved    Target Date 06/08/20               PT Long Term Goals - 06/30/20 1621       PT LONG TERM GOAL #1   Title Patient to be independent with advanced HEP.    Status Achieved      PT LONG TERM GOAL #2   Title Patient to demonstrate L knee AROM/PROM symmetrical to opposite LE without pain limiting.     Status Partially Met      PT LONG TERM GOAL #3   Title Patient to demonstrate B LE strength >=4+/5.    Status Partially Met      PT LONG TERM GOAL #4   Title Patient to report tolerance of walking 2/3 of a mile without excessive pain.    Status On-going   walks 1/3 of a mile but limited by low endurance and knee pain                  Plan - 06/30/20 1612     Clinical Impression Statement Patient arrived to session with request to wrap up with PT at this time d/t being busy. B knee AROM has improved, now with nearly full TKE on L knee. Strength testing revealed improved hip and knee strength, with L hip abduction and extension still limiting. Patient notes tolerance for 1/3 a mile of walking before she is limited by stamina or knee pain. Educated patient on benefits of aquatic therapy and swimming for continued fitness and LE strengthening while offloading joints. Updated and consolidated HEP to address remaining deficits. Patient reported understanding and without complaints at end of session. Patient is independent with HEP, has made progress towards goals, and is ready for DC at this time.    Personal Factors and  Comorbidities Age;Comorbidity 3+;Fitness;Past/Current Experience;Time since onset of injury/illness/exacerbation    Comorbidities SOB, palpitations, HTN, GERD, depression, B feet swelling, back pain, asthma, anxiety, R shoulder surgery, L knee surgery    PT Frequency 1x / week    PT Duration 6  weeks    PT Treatment/Interventions ADLs/Self Care Home Management;Cryotherapy;Electrical Stimulation;Iontophoresis 71m/ml Dexamethasone;Moist Heat;Ultrasound;Gait training;Stair training;Functional mobility training;Therapeutic activities;Therapeutic exercise;Manual techniques;Patient/family education;Neuromuscular re-education;Balance training;Passive range of motion;Dry needling;Energy conservation;Splinting;Vasopneumatic Device;Taping;Traction    PT Next Visit Plan DC at this time    Consulted and Agree with Plan of Care Patient             Patient will benefit from skilled therapeutic intervention in order to improve the following deficits and impairments:  Hypomobility, Decreased activity tolerance, Decreased strength, Pain, Difficulty walking, Decreased mobility, Decreased balance, Decreased range of motion, Improper body mechanics, Postural dysfunction, Impaired flexibility, Decreased endurance, Increased edema, Increased fascial restricitons, Increased muscle spasms  Visit Diagnosis: Chronic pain of left knee  Chronic midline low back pain, unspecified whether sciatica present  Difficulty in walking, not elsewhere classified  Other symptoms and signs involving the musculoskeletal system     Problem List Patient Active Problem List   Diagnosis Date Noted   OSA (obstructive sleep apnea) 09/03/2018   Mild persistent asthma without complication 053/69/2230  Severe obesity (BMI >= 40) ([place] 04/14/2018   Unilateral primary osteoarthritis, left knee 04/14/2018   Asthma in adult, mild intermittent, uncomplicated 009/79/4997  Allergic rhinitis 09/05/2013    PHYSICAL THERAPY DISCHARGE SUMMARY  Visits from Start of Care: 7  Current functional level related to goals / functional outcomes: See clinical impression   Remaining deficits: Hip weakness, limited walking tolerance   Education / Equipment: HEP  Plan: Patient agrees to discharge.  Patient goals were partially met.  Patient is being discharged per her request      YJanene Harvey PT, DPT 06/30/20 4:24 PM    [facility] [place] [address]Phone: [phone]  Fax:  [phone] Name: RPamula LutherMRN: [id number] of Birth: 110-03-1945

## 2020-06-30 NOTE — Progress Notes (Signed)
Chief Complaint:   OBESITY Crystal Jackson is here to discuss her progress with her obesity treatment plan along with follow-up of her obesity related diagnoses. Crystal Jackson is on the Category 2 Plan and states she is following her eating plan approximately 50% of the time. Crystal Jackson states she is doing physical therapy for 20 minutes 1 time per week, exercise for 20 minutes 3-4 times per week, and walking 1/3 of a mile 2 times per week.   Today's visit was #: 43 Starting weight: 242 lbs Starting date: 07/09/2018 Today's weight: 215 lbs Today's date: 06/29/2020 Total lbs lost to date: 27 Total lbs lost since last in-office visit: 4  Interim History: Crystal Jackson has been very busy with physical therapy and doctor's appointments. She did not get Ozempic due to lack of approval by her insurance for the 1 mg. She tends to come off the plan in the afternoon prior to dinner.  Subjective:   1. Pre-diabetes Crystal Jackson's Ozempic was not approved at 1 mg.  2. Vitamin D deficiency Crystal Jackson is on OTC calcium with Vit D.  Assessment/Plan:   1. Pre-diabetes Crystal Jackson will continue to work on weight loss, exercise, and decreasing simple carbohydrates to help decrease the risk of diabetes. We will check labs today. We will refill Ozempic 0.5 mg as her insurance will pay for 2 injection, and at some point will pay for 2 injections. Crystal Jackson agreed to start metformin 500 mg daily with no refills.  - metFORMIN (GLUCOPHAGE) 500 MG tablet; Take one tab daily with lunch  Dispense: 30 tablet; Refill: 0 - Semaglutide,0.25 or 0.5MG /DOS, (OZEMPIC, 0.25 OR 0.5 MG/DOSE,) 2 MG/1.5ML SOPN; Inject 0.5 mg into the skin once a week.  Dispense: 3 mL; Refill: 0 - Comprehensive metabolic panel - Hemoglobin A1c - Insulin, random  2. Vitamin D deficiency Low Vitamin D level contributes to fatigue and are associated with obesity, breast, and colon cancer. We will check labs today. Crystal Jackson will continue calcium with Vitamin D and will  follow-up for routine testing of Vitamin D, at least 2-3 times per year to avoid over-replacement.  - VITAMIN D 25 Hydroxy (Vit-D Deficiency, Fractures)  3. Class 2 severe obesity with serious comorbidity and body mass index (BMI) of 36.0 to 36.9 in adult, unspecified obesity type (HCC) Crystal Jackson is currently in the action stage of change. As such, her goal is to continue with weight loss efforts. She has agreed to the Category 2 Plan.   Exercise goals: As is.  Behavioral modification strategies: meal planning and cooking strategies.  Crystal Jackson has agreed to follow-up with our clinic in 2 weeks. She was informed of the importance of frequent follow-up visits to maximize her success with intensive lifestyle modifications for her multiple health conditions.   Crystal Jackson was informed we would discuss her lab results at her next visit unless there is a critical issue that needs to be addressed sooner. Crystal Jackson agreed to keep her next visit at the agreed upon time to discuss these results.  Objective:   Blood pressure 113/73, pulse 81, temperature (!) 97.5 F (36.4 C), height 5\' 4"  (1.626 m), weight 215 lb (97.5 kg), SpO2 96 %. Body mass index is 36.9 kg/m.  General: Cooperative, alert, well developed, in no acute distress. HEENT: Conjunctivae and lids unremarkable. Cardiovascular: Regular rhythm.  Lungs: Normal work of breathing. Neurologic: No focal deficits.   Lab Results  Component Value Date   CREATININE 0.92 06/29/2020   BUN 16 06/29/2020   NA 135 06/29/2020  K 4.2 06/29/2020   CL 94 (L) 06/29/2020   CO2 27 06/29/2020   Lab Results  Component Value Date   ALT 29 06/29/2020   AST 28 06/29/2020   ALKPHOS 49 06/29/2020   BILITOT 0.3 06/29/2020   Lab Results  Component Value Date   HGBA1C 5.6 06/29/2020   HGBA1C 5.4 10/19/2019   HGBA1C 5.3 05/27/2019   HGBA1C 5.4 11/10/2018   HGBA1C 5.8 (H) 07/09/2018   Lab Results  Component Value Date   INSULIN 17.7 06/29/2020   INSULIN  22.6 10/19/2019   INSULIN 16.2 05/27/2019   INSULIN 33.0 (H) 11/10/2018   INSULIN 25.0 (H) 07/09/2018   Lab Results  Component Value Date   TSH 2.210 07/09/2018   Lab Results  Component Value Date   CHOL 141 05/27/2019   HDL 55 05/27/2019   LDLCALC 69 05/27/2019   TRIG 88 05/27/2019   Lab Results  Component Value Date   VD25OH 44.7 06/29/2020   VD25OH 51.9 10/19/2019   VD25OH 90.2 11/10/2018   Lab Results  Component Value Date   WBC 6.0 07/09/2018   HGB 13.6 07/09/2018   HCT 39.7 07/09/2018   MCV 89 07/09/2018   PLT 241 07/09/2018   No results found for: IRON, TIBC, FERRITIN  Obesity Behavioral Intervention:   Approximately 15 minutes were spent on the discussion below.  ASK: We discussed the diagnosis of obesity with Crystal Jackson today and Crystal Jackson agreed to give [name] permission to discuss obesity behavioral modification therapy today.  ASSESS: Crystal Jackson has the diagnosis of obesity and her BMI today is 36.89. Crystal Jackson is in the action stage of change.   ADVISE: Crystal Jackson was educated on the multiple health risks of obesity as well as the benefit of weight loss to improve her health. She was advised of the need for long term treatment and the importance of lifestyle modifications to improve her current health and to decrease her risk of future health problems.  AGREE: Multiple dietary modification options and treatment options were discussed and Crystal Jackson agreed to follow the recommendations documented in the above note.  ARRANGE: Crystal Jackson was educated on the importance of frequent visits to treat obesity as outlined per CMS and USPSTF guidelines and agreed to schedule her next follow up appointment today.  Attestation Statements:   Reviewed by clinician on day of visit: allergies, medications, problem list, medical history, surgical history, family history, social history, and previous encounter notes.   [name], am acting as transcriptionist for [employer],  PA-C.  I have reviewed the above documentation for accuracy and completeness, and I agree with the above. [name], PA-C

## 2020-07-05 ENCOUNTER — Telehealth (INDEPENDENT_AMBULATORY_CARE_PROVIDER_SITE_OTHER): Payer: Medicare Other | Admitting: Psychology

## 2020-07-05 DIAGNOSIS — F330 Major depressive disorder, recurrent, mild: Secondary | ICD-10-CM | POA: Diagnosis not present

## 2020-07-05 DIAGNOSIS — F5089 Other specified eating disorder: Secondary | ICD-10-CM

## 2020-07-05 NOTE — Telephone Encounter (Signed)
Pt last seen by [name] Aguilar, PA-C.  

## 2020-07-05 NOTE — Telephone Encounter (Signed)
Please advise 

## 2020-07-06 NOTE — Telephone Encounter (Signed)
FYI

## 2020-07-11 NOTE — Progress Notes (Signed)
Office: [phone]  /  Fax: [phone]    Date: July 27, 2020   Appointment Start Time: 10:01am Duration: 28 minutes Provider: Lawerance Cruel, Psy.D. Type of Session: Individual Therapy  Location of Patient: Home Location of Provider: Provider's home (private office) Type of Contact: Telepsychological Visit via MyChart Video Visit  Session Content: Crystal Jackson is a 75 y.o. female presenting for a follow-up appointment to address the previously established treatment goal of increasing coping skills. Today's appointment was a telepsychological visit due to COVID-19. Crystal Jackson provided verbal consent for today's telepsychological appointment and she is aware she is responsible for securing confidentiality on her end of the session. Prior to proceeding with today's appointment, Crystal Jackson's physical location at the time of this appointment was obtained as well a phone number she could be reached at in the event of technical difficulties. Crystal Jackson and this provider participated in today's telepsychological service.   This provider conducted a brief check-in. Everline stated, "I've been really concentrating on eating the right stuff." Despite challenges with the structured meal plan, she reflected she "did not really" deviate from the plan as she has done in the past. She acknowledged "some" engagement in emotional eating behaviors due to being "down" about current circumstances. Thus, this provider discussed the previously placed referral with [facility]. She declined scheduling when contacted. This provider again explained her role and the reason for referral. She noted she discussed being busy with appointments with the coordinator and was informed she could call when she is "ready." At this time, Crystal Jackson reported she is ready. Moreover, she discussed ongoing sleep issues despite taking Lunesta. Crystal Jackson reported her mind often races making it difficult to initiate sleep. Recommended she reach  out to her prescribing provider. Reviewed "Leaves on A Stream" exercise and discussed the benefit of engaging in that exercise prior to going to sleep nightly. She was receptive and requested this provider e-mail the link for the exercise again. Crystal Jackson also discussed a plan to incorporate previously discussed mindfulness exercises as part of her nighttime routine.   Crystal Jackson of today's appointment focused on feeling down and its impact on eating. Reviewed radical acceptance. Further discussed coping strategies using ACCEPTS from [facility]. Crystal Jackson provided verbal consent during today's appointment for this provider to send a handout about radical acceptance and coping strategies via e-mail. Overall, Crystal Jackson was receptive to today's appointment as evidenced by openness to sharing, responsiveness to feedback, and willingness to implement discussed strategies .  Mental Status Examination:  Appearance: well groomed and appropriate hygiene  Behavior: appropriate to circumstances Mood: euthymic Affect: mood congruent Speech: normal in rate, volume, and tone Eye Contact: appropriate Psychomotor Activity: unable to assess Gait: unable to assess Thought Process: linear, logical, and goal directed  Thought Content/Perception: no hallucinations, delusions, bizarre thinking or behavior reported or observed and no evidence or endorsement of suicidal and homicidal ideation, plan, and intent Orientation: time, person, place, and purpose of appointment Memory/Concentration: memory, attention, language, and fund of knowledge intact  Insight/Judgment: fair  Interventions:  Conducted a brief chart review Provided empathic reflections and validation Reviewed content from the previous session Processed thoughts and feelings Employed motivational interviewing skills to assess patient's willingness/desire to adhere to recommended medical treatments and assignments Discussed termination planning Discussed option for a  referral for longer-term therapeutic services Employed supportive psychotherapy interventions to facilitate reduced distress, and to improve coping skills with identified stressors Further discussed radical acceptance   DSM-5 Diagnosis(es):  F50.89 Other Specified Feeding or Eating Disorder, Emotional Eating  Behaviors and F33.0 Major Depressive Disorder, Recurrent Episode, Mild  Treatment Goal & Progress: During the initial appointment with this provider, the following treatment goal was established: increase coping skills. Mahum has demonstrated progress in her goal as evidenced by increased awareness of hunger patterns and increased awareness of triggers for emotional eating behaviors. Bethania also continues to demonstrate willingness to engage in learned skill(s).  Plan: The next appointment will be scheduled in 3-4 weeks, which will be via MyChart Video Visit. The next session will focus on working towards the established treatment goal and possible termination . Novah reported she will call [facility] to start the scheduling process between now and the next appointment with this provider.

## 2020-07-13 ENCOUNTER — Other Ambulatory Visit: Payer: Self-pay

## 2020-07-13 ENCOUNTER — Ambulatory Visit (INDEPENDENT_AMBULATORY_CARE_PROVIDER_SITE_OTHER): Payer: Medicare Other | Admitting: Family Medicine

## 2020-07-13 VITALS — BP 110/66 | HR 81 | Temp 97.9°F | Ht 64.0 in | Wt 217.0 lb

## 2020-07-13 DIAGNOSIS — Z6841 Body Mass Index (BMI) 40.0 and over, adult: Secondary | ICD-10-CM | POA: Diagnosis not present

## 2020-07-13 DIAGNOSIS — R7303 Prediabetes: Secondary | ICD-10-CM | POA: Diagnosis not present

## 2020-07-13 DIAGNOSIS — E559 Vitamin D deficiency, unspecified: Secondary | ICD-10-CM

## 2020-07-13 MED ORDER — CALCIUM CARBONATE-VITAMIN D 600-400 MG-UNIT PO TABS: ORAL_TABLET | ORAL | 0 refills | Status: AC

## 2020-07-13 MED ORDER — METFORMIN HCL 500 MG PO TABS: ORAL_TABLET | ORAL | 0 refills | Status: DC

## 2020-07-25 ENCOUNTER — Telehealth (INDEPENDENT_AMBULATORY_CARE_PROVIDER_SITE_OTHER): Payer: Medicare Other | Admitting: Psychology

## 2020-07-25 DIAGNOSIS — F330 Major depressive disorder, recurrent, mild: Secondary | ICD-10-CM | POA: Diagnosis not present

## 2020-07-25 DIAGNOSIS — F5089 Other specified eating disorder: Secondary | ICD-10-CM | POA: Diagnosis not present

## 2020-07-26 NOTE — Progress Notes (Signed)
Chief Complaint:   OBESITY Alleene is here to discuss her progress with her obesity treatment plan along with follow-up of her obesity related diagnoses.   Today's visit was #: 44 Starting weight: 242 lbs Starting date: 07/09/2018 Today's weight: 217 lbs Today's date: 07/13/2020 Weight change since last visit: +2 lbs Total lbs lost to date: 25 lbs Body mass index is 37.25 kg/m.  Total weight loss percentage to date: -10.33%  Interim History:  Quanasia has been doing excess snacking beyond allotted snack calories.  Not following plan.  She says she is bored and desires more flexibility.  She is here to go over labs as well.  Current Meal Plan: the Category 2 Plan for 50% of the time.  Current Exercise Plan: Walking for 15-20 minutes 1-2 times per week. Current Anti-Obesity Medications: Ozempic 0.5 mg subcutaneously weekly. Side effects: None.  Assessment/Plan:   Medications Discontinued During This Encounter  Medication Reason   metFORMIN (GLUCOPHAGE) 500 MG tablet Reorder   Calcium Carbonate-Vitamin D (CALCIUM-VITAMIN D) 500-200 MG-UNIT tablet    Meds ordered this encounter  Medications   metFORMIN (GLUCOPHAGE) 500 MG tablet    Sig: Take one tab daily with lunch    Dispense:  30 tablet    Refill:  0   Calcium Carbonate-Vitamin D 600-400 MG-UNIT tablet    Sig: 1 po qd    Dispense:  10 tablet    Refill:  0    1. Pre-diabetes At goal. Goal is HgbA1c < 5.7.  Medication: metformin 500 mg daily.  She started on metformin at last office visit.  Tolerating well and has less sugar cravings.  Plan:  Worsening.  Discussed labs with patient today.  A1c worsening.  Continue Ozempic.  She declines refill.  Refill metformin.  Disease counseling done.  She will continue to focus on protein-rich, low simple carbohydrate foods. We reviewed the importance of hydration, regular exercise for stress reduction, and restorative sleep.   Lab Results  Component Value Date   HGBA1C 5.6  06/29/2020   Lab Results  Component Value Date   INSULIN 17.7 06/29/2020   INSULIN 22.6 10/19/2019   INSULIN 16.2 05/27/2019   INSULIN 33.0 (H) 11/10/2018   INSULIN 25.0 (H) 07/09/2018   - Refill metFORMIN (GLUCOPHAGE) 500 MG tablet; Take one tab daily with lunch  Dispense: 30 tablet; Refill: 0  2. Vitamin D deficiency Not at goal.  No OTC supplement for 1 year or so.  Plan:  Discussed labs with patient today.  Start calcium-vitamin D 600-400 mg-IU supplement daily.  Slightly suboptimal vitamin D level.  Recheck in 3-4 months.  Lab Results  Component Value Date   VD25OH 44.7 06/29/2020   VD25OH 51.9 10/19/2019   VD25OH 90.2 11/10/2018   - Start Calcium Carbonate-Vitamin D 600-400 MG-UNIT tablet; 1 po qd  Dispense: 10 tablet; Refill: 0  3. Class 3 severe obesity with serious comorbidity and body mass index (BMI) of 40.0 to 44.9 in adult, unspecified obesity type (HCC)  Course: Maryori is currently in the action stage of change. As such, her goal is to continue with weight loss efforts.   Nutrition goals: She has agreed to keeping a food journal and adhering to recommended goals of 1100-1200 calories and 85+ grams of protein.   Exercise goals:  As is.  Behavioral modification strategies: increasing lean protein intake, decreasing simple carbohydrates, and planning for success.  Cordella has agreed to follow-up with our clinic in 2 weeks. She was informed of  the importance of frequent follow-up visits to maximize her success with intensive lifestyle modifications for her multiple health conditions.   Objective:   Blood pressure 110/66, pulse 81, temperature 97.9 F (36.6 C), height 5\' 4"  (1.626 m), weight 217 lb (98.4 kg), SpO2 97 %. Body mass index is 37.25 kg/m.  General: Cooperative, alert, well developed, in no acute distress. HEENT: Conjunctivae and lids unremarkable. Cardiovascular: Regular rhythm.  Lungs: Normal work of breathing. Neurologic: No focal deficits.    Lab Results  Component Value Date   CREATININE 0.92 06/29/2020   BUN 16 06/29/2020   NA 135 06/29/2020   K 4.2 06/29/2020   CL 94 (L) 06/29/2020   CO2 27 06/29/2020   Lab Results  Component Value Date   ALT 29 06/29/2020   AST 28 06/29/2020   ALKPHOS 49 06/29/2020   BILITOT 0.3 06/29/2020   Lab Results  Component Value Date   HGBA1C 5.6 06/29/2020   HGBA1C 5.4 10/19/2019   HGBA1C 5.3 05/27/2019   HGBA1C 5.4 11/10/2018   HGBA1C 5.8 (H) 07/09/2018   Lab Results  Component Value Date   INSULIN 17.7 06/29/2020   INSULIN 22.6 10/19/2019   INSULIN 16.2 05/27/2019   INSULIN 33.0 (H) 11/10/2018   INSULIN 25.0 (H) 07/09/2018   Lab Results  Component Value Date   TSH 2.210 07/09/2018   Lab Results  Component Value Date   CHOL 141 05/27/2019   HDL 55 05/27/2019   LDLCALC 69 05/27/2019   TRIG 88 05/27/2019   Lab Results  Component Value Date   VD25OH 44.7 06/29/2020   VD25OH 51.9 10/19/2019   VD25OH 90.2 11/10/2018   Lab Results  Component Value Date   WBC 6.0 07/09/2018   HGB 13.6 07/09/2018   HCT 39.7 07/09/2018   MCV 89 07/09/2018   PLT 241 07/09/2018   Obesity Behavioral Intervention:   Approximately 15 minutes were spent on the discussion below.  ASK: We discussed the diagnosis of obesity with Toia today and Carlynn agreed to give [name] permission to discuss obesity behavioral modification therapy today.  ASSESS: Viva has the diagnosis of obesity and her BMI today is 37.4. Brexley is in the action stage of change.   ADVISE: Jera was educated on the multiple health risks of obesity as well as the benefit of weight loss to improve her health. She was advised of the need for long term treatment and the importance of lifestyle modifications to improve her current health and to decrease her risk of future health problems.  AGREE: Multiple dietary modification options and treatment options were discussed and Kiran agreed to follow the  recommendations documented in the above note.  ARRANGE: Katja was educated on the importance of frequent visits to treat obesity as outlined per CMS and USPSTF guidelines and agreed to schedule her next follow up appointment today.  Attestation Statements:   Reviewed by clinician on day of visit: allergies, medications, problem list, medical history, surgical history, family history, social history, and previous encounter notes.  I, [name], CMA, am acting as Insurance claims handler for Energy manager, DO.  I have reviewed the above documentation for accuracy and completeness, and I agree with the above. [employer], D.O.  The 21st Century Cures Act was signed into law in 2016 which includes the topic of electronic health records.  This provides immediate access to information in MyChart.  This includes consultation notes, operative notes, office notes, lab results and pathology reports.  If you have any  questions about what you read please let us know at your next visit so we can discuss your concerns and take corrective action if need be.  We are right here with you.

## 2020-08-01 ENCOUNTER — Encounter (INDEPENDENT_AMBULATORY_CARE_PROVIDER_SITE_OTHER): Payer: Self-pay | Admitting: Physician Assistant

## 2020-08-01 ENCOUNTER — Other Ambulatory Visit: Payer: Self-pay

## 2020-08-01 ENCOUNTER — Ambulatory Visit (INDEPENDENT_AMBULATORY_CARE_PROVIDER_SITE_OTHER): Payer: Medicare Other | Admitting: Physician Assistant

## 2020-08-01 ENCOUNTER — Other Ambulatory Visit (INDEPENDENT_AMBULATORY_CARE_PROVIDER_SITE_OTHER): Payer: Self-pay | Admitting: Physician Assistant

## 2020-08-01 VITALS — BP 95/63 | HR 84 | Temp 97.7°F | Ht 64.0 in | Wt 214.0 lb

## 2020-08-01 DIAGNOSIS — K5909 Other constipation: Secondary | ICD-10-CM

## 2020-08-01 DIAGNOSIS — R7303 Prediabetes: Secondary | ICD-10-CM | POA: Diagnosis not present

## 2020-08-01 DIAGNOSIS — Z6841 Body Mass Index (BMI) 40.0 and over, adult: Secondary | ICD-10-CM

## 2020-08-01 MED ORDER — METFORMIN HCL 500 MG PO TABS: ORAL_TABLET | ORAL | 0 refills | Status: DC

## 2020-08-01 MED ORDER — OZEMPIC (0.25 OR 0.5 MG/DOSE) 2 MG/1.5ML ~~LOC~~ SOPN: 0.5000 mg | PEN_INJECTOR | SUBCUTANEOUS | 0 refills | Status: DC

## 2020-08-01 NOTE — Telephone Encounter (Signed)
Seeing Crystal Jackson today

## 2020-08-02 ENCOUNTER — Encounter (INDEPENDENT_AMBULATORY_CARE_PROVIDER_SITE_OTHER): Payer: Self-pay

## 2020-08-02 NOTE — Progress Notes (Signed)
Chief Complaint:   OBESITY Crystal Jackson is here to discuss her progress with her obesity treatment plan along with follow-up of her obesity related diagnoses. Crystal Jackson is on keeping a food journal and adhering to recommended goals of 1100-1200 calories and 85 grams of protein and states she is following her eating plan approximately 70% of the time. Crystal Jackson states she is walking for 10 minutes 3 times per week.  Today's visit was #: 45 Starting weight: 242 lbs Starting date: 07/09/2018 Today's weight: 214 lbs Today's date: 08/01/2020 Total lbs lost to date: 28 lbs Total lbs lost since last in-office visit: 3 lbs  Interim History: Crystal Jackson did well with weight loss. She reports that she has tried very hard to eat on plan. She has been walking more often and drinking more water. She is journaling off and on.  Subjective:   1. Pre-diabetes Crystal Jackson is on Ozempic and Metformin. Her last A1C level was 5.6. Crystal Jackson states Ozempic is decreasing her appetite.  2. Other constipation Crystal Jackson states no black stools or blood in stools. Her last colonoscopy was within 5 years.  Assessment/Plan:   1. Pre-diabetes Lashuna will continue to work on weight loss, exercise, and decreasing simple carbohydrates to help decrease the risk of diabetes. We will refill Ozempic 0.5 mg for 1 month with no refills and Metformin for 1 month with no refills tjoday.  - metFORMIN (GLUCOPHAGE) 500 MG tablet; Take one tab daily with lunch  Dispense: 30 tablet; Refill: 0  - Semaglutide,0.25 or 0.5MG /DOS, (OZEMPIC, 0.25 OR 0.5 MG/DOSE,) 2 MG/1.5ML SOPN; Inject 0.5 mg into the skin once a week.  Dispense: 3 mL; Refill: 0  2. Other constipation Crystal Jackson was informed that a decrease in bowel movement frequency is normal while losing weight, but stools should not be hard or painful. Orders and follow up as documented in patient record. Crystal Jackson agreed to add Metamucil daily and she will increase fiber.  Counseling Getting to  Good Bowel Health: Your goal is to have one soft bowel movement each day. Drink at least 8 glasses of water each day. Eat plenty of fiber (goal is over 25 grams each day). It is best to get most of your fiber from dietary sources which includes leafy green vegetables, fresh fruit, and whole grains. You may need to add fiber with the help of OTC fiber supplements. These include Metamucil, Citrucel, and Flaxseed. If you are still having trouble, try adding Miralax or Magnesium Citrate. If all of these changes do not work, Dietitian.   3. Class 3 severe obesity with serious comorbidity and body mass index (BMI) of 40.0 to 44.9 in adult, unspecified obesity type (HCC), current BMI 36.72 Crystal Jackson is currently in the action stage of change. As such, her goal is to continue with weight loss efforts. She has agreed to keeping a food journal and adhering to recommended goals of 1100-1200 calories and 85 grams of  protein daily.  Exercise goals: No exercise has been prescribed at this time.  Behavioral modification strategies: meal planning and cooking strategies and keeping healthy foods in the home.  Crystal Jackson has agreed to follow-up with our clinic in 4 weeks. She was informed of the importance of frequent follow-up visits to maximize her success with intensive lifestyle modifications for her multiple health conditions.   Objective:   Blood pressure 95/63, pulse 84, temperature 97.7 F (36.5 C), height 5\' 4"  (1.626 m), weight 214 lb (97.1 kg), SpO2 97 %. Body mass index is  36.73 kg/m.  General: Cooperative, alert, well developed, in no acute distress. HEENT: Conjunctivae and lids unremarkable. Cardiovascular: Regular rhythm.  Lungs: Normal work of breathing. Neurologic: No focal deficits.   Lab Results  Component Value Date   CREATININE 0.92 06/29/2020   BUN 16 06/29/2020   NA 135 06/29/2020   K 4.2 06/29/2020   CL 94 (L) 06/29/2020   CO2 27 06/29/2020   Lab Results  Component  Value Date   ALT 29 06/29/2020   AST 28 06/29/2020   ALKPHOS 49 06/29/2020   BILITOT 0.3 06/29/2020   Lab Results  Component Value Date   HGBA1C 5.6 06/29/2020   HGBA1C 5.4 10/19/2019   HGBA1C 5.3 05/27/2019   HGBA1C 5.4 11/10/2018   HGBA1C 5.8 (H) 07/09/2018   Lab Results  Component Value Date   INSULIN 17.7 06/29/2020   INSULIN 22.6 10/19/2019   INSULIN 16.2 05/27/2019   INSULIN 33.0 (H) 11/10/2018   INSULIN 25.0 (H) 07/09/2018   Lab Results  Component Value Date   TSH 2.210 07/09/2018   Lab Results  Component Value Date   CHOL 141 05/27/2019   HDL 55 05/27/2019   LDLCALC 69 05/27/2019   TRIG 88 05/27/2019   Lab Results  Component Value Date   VD25OH 44.7 06/29/2020   VD25OH 51.9 10/19/2019   VD25OH 90.2 11/10/2018   Lab Results  Component Value Date   WBC 6.0 07/09/2018   HGB 13.6 07/09/2018   HCT 39.7 07/09/2018   MCV 89 07/09/2018   PLT 241 07/09/2018   No results found for: IRON, TIBC, FERRITIN  Obesity Behavioral Intervention:   Approximately 15 minutes were spent on the discussion below.  ASK: We discussed the diagnosis of obesity with Crystal Jackson today and Crystal Jackson agreed to give [name] permission to discuss obesity behavioral modification therapy today.  ASSESS: Crystal Jackson has the diagnosis of obesity and her BMI today is 36.8. Crystal Jackson is in the action stage of change.   ADVISE: Crystal Jackson was educated on the multiple health risks of obesity as well as the benefit of weight loss to improve her health. She was advised of the need for long term treatment and the importance of lifestyle modifications to improve her current health and to decrease her risk of future health problems.  AGREE: Multiple dietary modification options and treatment options were discussed and Crystal Jackson agreed to follow the recommendations documented in the above note.  ARRANGE: Crystal Jackson was educated on the importance of frequent visits to treat obesity as outlined per CMS and USPSTF  guidelines and agreed to schedule her next follow up appointment today.  Attestation Statements:   Reviewed by clinician on day of visit: allergies, medications, problem list, medical history, surgical history, family history, social history, and previous encounter notes.  I, Sindy Messing, am acting as Energy manager for [employer], PA-C.   I have reviewed the above documentation for accuracy and completeness, and I agree with the above. [name], PA-C

## 2020-08-08 NOTE — Progress Notes (Signed)
Office: [phone]  /  Fax: [phone]    Date: August 22, 2020   Appointment Start Time: 10:12am Duration: 19 minutes Provider: Lawerance Cruel, Psy.D. Type of Session: Individual Therapy  Location of Patient: Home Location of Provider: Provider's home (private office) Type of Contact: Telepsychological Visit via MyChart Video Visit  Session Content: Crystal Jackson is a 75 y.o. female presenting for a follow-up appointment to address the previously established treatment goal of increasing coping skills. Today's appointment was a telepsychological visit due to COVID-19. Crystal Jackson provided verbal consent for today's telepsychological appointment and she is aware she is responsible for securing confidentiality on her end of the session. Prior to proceeding with today's appointment, Crystal Jackson's physical location at the time of this appointment was obtained as well a phone number she could be reached at in the event of technical difficulties. Crystal Jackson and this provider participated in today's telepsychological service. Of note, this provider's front desk informed Crystal Jackson this provider was running approximately 5-10 minutes behind. This provider called Crystal Jackson at 10:11am as she did not present for today's appointment to inform her this provider was available. She indicated she was ready to join. As such, today's appointment was initiated 12 minutes late.  This provider conducted a brief check-in. Crystal Jackson stated "not much has changed," adding she is walking and cooking more. She noted, "I'm really doing good. I've been sleeping better." She acknowledged she did not make an appointment with [facility], adding, "I think I'm going to." She discussed recent reflection that led to her decision to initiate traditional therapeutic services. Associated thoughts and feelings were processed. Reviewed learned skills (e.g., radical acceptance, pleasurable activities). Overall, Crystal Jackson was receptive to today's  appointment as evidenced by openness to sharing, responsiveness to feedback, and willingness to continue engaging in learned skills.  Mental Status Examination:  Appearance: well groomed and appropriate hygiene  Behavior: appropriate to circumstances Mood: euthymic Affect: mood congruent Speech: normal in rate, volume, and tone Eye Contact: appropriate Psychomotor Activity: unable to assess Gait: unable to assess Thought Process: linear, logical, and goal directed  Thought Content/Perception: no hallucinations, delusions, bizarre thinking or behavior reported or observed and no evidence or endorsement of suicidal and homicidal ideation, plan, and intent Orientation: time, person, place, and purpose of appointment Memory/Concentration: memory, attention, language, and fund of knowledge intact  Insight/Judgment: fair  Interventions:  Conducted a brief chart review Provided empathic reflections and validation Processed thoughts and feelings Employed motivational interviewing skills to assess patient's willingness/desire to adhere to recommended medical treatments and assignments Reviewed learned skills Employed supportive psychotherapy interventions to facilitate reduced distress, and to improve coping skills with identified stressors  DSM-5 Diagnosis(es):  F50.89 Other Specified Feeding or Eating Disorder, Emotional Eating Behaviors and F33.0 Major Depressive Disorder, Recurrent Episode, Mild  Treatment Goal & Progress: During the initial appointment with this provider, the following treatment goal was established: increase coping skills. Crystal Jackson demonstrated progress in her goal as evidenced by increased awareness of hunger patterns, increased awareness of triggers for emotional eating behaviors, and reduction in emotional eating behaviors . Crystal Jackson also continues to demonstrate willingness to engage in learned skill(s).  Plan: As previously planned, today was Crystal Jackson's last appointment  with this provider. She acknowledged understanding that she may request a follow-up appointment with this provider in the future as long as she is still established with the clinic. She indicated a plan to initiate services with [facility]. Phone number provided. No further follow-up planned by this provider.

## 2020-08-15 ENCOUNTER — Ambulatory Visit (INDEPENDENT_AMBULATORY_CARE_PROVIDER_SITE_OTHER): Payer: Medicare Other | Admitting: Family Medicine

## 2020-08-15 ENCOUNTER — Encounter (INDEPENDENT_AMBULATORY_CARE_PROVIDER_SITE_OTHER): Payer: Self-pay | Admitting: Family Medicine

## 2020-08-15 ENCOUNTER — Other Ambulatory Visit: Payer: Self-pay

## 2020-08-15 VITALS — BP 101/66 | HR 73 | Temp 97.7°F | Ht 64.0 in | Wt 217.0 lb

## 2020-08-15 DIAGNOSIS — R7303 Prediabetes: Secondary | ICD-10-CM

## 2020-08-15 DIAGNOSIS — Z6841 Body Mass Index (BMI) 40.0 and over, adult: Secondary | ICD-10-CM

## 2020-08-15 MED ORDER — METFORMIN HCL 500 MG PO TABS: 500.0000 mg | ORAL_TABLET | Freq: Two times a day (BID) | ORAL | 0 refills | Status: DC

## 2020-08-16 NOTE — Progress Notes (Signed)
Chief Complaint:   OBESITY Oberia is here to discuss her progress with her obesity treatment plan along with follow-up of her obesity related diagnoses. Freddy is on keeping a food journal and adhering to recommended goals of 1100-1200 calories and 85 grams protein and states she is following her eating plan approximately 50-60% of the time. Fancy states she is walking 15 minutes 2-3 times per week.  Today's visit was #: 46 Starting weight: 242 lbs Starting date: 07/09/2018 Today's weight: 217 lbs Today's date: 08/15/2020 Total lbs lost to date: 25 Total lbs lost since last in-office visit: +3  Interim History: Lilyahna made some "poor decisions" last week and is disappointed with herself. She didn't feel great and had increased stress, thus ate poorly.  Subjective:   1. Pre-diabetes Insurance would not cover Ozempic and her 1 month coupon expired. She is only taking Metformin at this time. She reports increased snacking after lunch and dinner.   Lab Results  Component Value Date   HGBA1C 5.6 06/29/2020   HGBA1C 5.4 10/19/2019   HGBA1C 5.3 05/27/2019   Lab Results  Component Value Date   LDLCALC 69 05/27/2019   CREATININE 0.92 06/29/2020   Assessment/Plan:   1. Pre-diabetes Pt will call her insurance and see if they cover any other GLP-1 meds. Refill and increase to BID- metFORMIN (GLUCOPHAGE) 500 MG tablet; Take 1 tablet (500 mg total) by mouth 2 (two) times daily with a meal. (with lunch and dinner)  Dispense: 60 tablet; Refill: 0 Check labs in 2 months after increase in dose. -Vitamin B12 - Basic Metabolic Panel (BMET) - Magnesium  2. Obesity with current BMI of 37.3  Zoriah is currently in the action stage of change. As such, her goal is to continue with weight loss efforts. She has agreed to keeping a food journal and adhering to recommended goals of 1100-1200 calories and 85 grams protein.   Discontinue Ozempic as insurance will not cover it.  Exercise  goals:  As is  Behavioral modification strategies: increasing lean protein intake, decreasing simple carbohydrates, and better snacking choices.  Saje has agreed to follow-up with our clinic in 3 weeks. She was informed of the importance of frequent follow-up visits to maximize her success with intensive lifestyle modifications for her multiple health conditions.   Objective:   Blood pressure 101/66, pulse 73, temperature 97.7 F (36.5 C), height 5\' 4"  (1.626 m), weight 217 lb (98.4 kg), SpO2 98 %. Body mass index is 37.25 kg/m.  General: Cooperative, alert, well developed, in no acute distress. HEENT: Conjunctivae and lids unremarkable. Cardiovascular: Regular rhythm.  Lungs: Normal work of breathing. Neurologic: No focal deficits.   Lab Results  Component Value Date   CREATININE 0.92 06/29/2020   BUN 16 06/29/2020   NA 135 06/29/2020   K 4.2 06/29/2020   CL 94 (L) 06/29/2020   CO2 27 06/29/2020   Lab Results  Component Value Date   ALT 29 06/29/2020   AST 28 06/29/2020   ALKPHOS 49 06/29/2020   BILITOT 0.3 06/29/2020   Lab Results  Component Value Date   HGBA1C 5.6 06/29/2020   HGBA1C 5.4 10/19/2019   HGBA1C 5.3 05/27/2019   HGBA1C 5.4 11/10/2018   HGBA1C 5.8 (H) 07/09/2018   Lab Results  Component Value Date   INSULIN 17.7 06/29/2020   INSULIN 22.6 10/19/2019   INSULIN 16.2 05/27/2019   INSULIN 33.0 (H) 11/10/2018   INSULIN 25.0 (H) 07/09/2018   Lab Results  Component Value  Date   TSH 2.210 07/09/2018   Lab Results  Component Value Date   CHOL 141 05/27/2019   HDL 55 05/27/2019   LDLCALC 69 05/27/2019   TRIG 88 05/27/2019   Lab Results  Component Value Date   VD25OH 44.7 06/29/2020   VD25OH 51.9 10/19/2019   VD25OH 90.2 11/10/2018   Lab Results  Component Value Date   WBC 6.0 07/09/2018   HGB 13.6 07/09/2018   HCT 39.7 07/09/2018   MCV 89 07/09/2018   PLT 241 07/09/2018   No results found for: IRON, TIBC, FERRITIN  Obesity Behavioral  Intervention:   Approximately 15 minutes were spent on the discussion below.  ASK: We discussed the diagnosis of obesity with Amandeep today and Rayleen agreed to give [name] permission to discuss obesity behavioral modification therapy today.  ASSESS: Shakeria has the diagnosis of obesity and her BMI today is 37.3. Gunda is in the action stage of change.   ADVISE: Franny was educated on the multiple health risks of obesity as well as the benefit of weight loss to improve her health. She was advised of the need for long term treatment and the importance of lifestyle modifications to improve her current health and to decrease her risk of future health problems.  AGREE: Multiple dietary modification options and treatment options were discussed and Alessandra agreed to follow the recommendations documented in the above note.  ARRANGE: Terri was educated on the importance of frequent visits to treat obesity as outlined per CMS and USPSTF guidelines and agreed to schedule her next follow up appointment today.  Attestation Statements:   Reviewed by clinician on day of visit: allergies, medications, problem list, medical history, surgical history, family history, social history, and previous encounter notes.   Edmund Hilda, CMA, am acting as transcriptionist for [employer], DO.  I have reviewed the above documentation for accuracy and completeness, and I agree with the above. Carlye Grippe, D.O.  The 21st Century Cures Act was signed into law in 2016 which includes the topic of electronic health records.  This provides immediate access to information in MyChart.  This includes consultation notes, operative notes, office notes, lab results and pathology reports.  If you have any questions about what you read please let us know at your next visit so we can discuss your concerns and take corrective action if need be.  We are right here with you.

## 2020-08-22 ENCOUNTER — Telehealth (INDEPENDENT_AMBULATORY_CARE_PROVIDER_SITE_OTHER): Payer: Medicare Other | Admitting: Psychology

## 2020-08-22 DIAGNOSIS — F330 Major depressive disorder, recurrent, mild: Secondary | ICD-10-CM

## 2020-08-22 DIAGNOSIS — F5089 Other specified eating disorder: Secondary | ICD-10-CM | POA: Diagnosis not present

## 2020-08-29 ENCOUNTER — Ambulatory Visit (INDEPENDENT_AMBULATORY_CARE_PROVIDER_SITE_OTHER): Payer: Medicare Other | Admitting: Physician Assistant

## 2020-09-07 ENCOUNTER — Other Ambulatory Visit: Payer: Self-pay

## 2020-09-07 ENCOUNTER — Encounter (INDEPENDENT_AMBULATORY_CARE_PROVIDER_SITE_OTHER): Payer: Self-pay | Admitting: Family Medicine

## 2020-09-07 ENCOUNTER — Ambulatory Visit (INDEPENDENT_AMBULATORY_CARE_PROVIDER_SITE_OTHER): Payer: Medicare Other | Admitting: Family Medicine

## 2020-09-07 VITALS — BP 121/72 | HR 69 | Temp 97.6°F | Ht 64.0 in | Wt 216.0 lb

## 2020-09-07 DIAGNOSIS — R7303 Prediabetes: Secondary | ICD-10-CM | POA: Diagnosis not present

## 2020-09-07 DIAGNOSIS — I10 Essential (primary) hypertension: Secondary | ICD-10-CM | POA: Diagnosis not present

## 2020-09-07 DIAGNOSIS — Z6841 Body Mass Index (BMI) 40.0 and over, adult: Secondary | ICD-10-CM

## 2020-09-07 MED ORDER — METFORMIN HCL 500 MG PO TABS: 500.0000 mg | ORAL_TABLET | Freq: Two times a day (BID) | ORAL | 0 refills | Status: DC

## 2020-09-08 ENCOUNTER — Encounter: Payer: Self-pay | Admitting: Physician Assistant

## 2020-09-08 ENCOUNTER — Ambulatory Visit: Payer: Self-pay

## 2020-09-08 ENCOUNTER — Ambulatory Visit: Payer: Medicare Other | Admitting: Physician Assistant

## 2020-09-08 VITALS — Ht 64.0 in | Wt 217.0 lb

## 2020-09-08 DIAGNOSIS — M25511 Pain in right shoulder: Secondary | ICD-10-CM | POA: Diagnosis not present

## 2020-09-08 MED ORDER — LIDOCAINE HCL 1 % IJ SOLN
3.0000 mL | INTRAMUSCULAR | Status: AC | PRN
  Administered 2020-09-08: 3 mL

## 2020-09-08 MED ORDER — METHYLPREDNISOLONE ACETATE 40 MG/ML IJ SUSP
40.0000 mg | INTRAMUSCULAR | Status: AC | PRN
Start: 2020-09-08 — End: 2020-09-08
  Administered 2020-09-08: 40 mg via INTRA_ARTICULAR

## 2020-09-08 NOTE — Progress Notes (Addendum)
HPI: Mrs. Heringer is well-known to our department service comes in today with new complaint of right shoulder pain.  She states she had right shoulder pain for the past month.  No known injury.  Has difficulty with overhead activities and reaching behind her.  7 8 years ago she had a right shoulder rotator cuff repair that was open done in [place].  She has had no real pain with the right shoulder since that time.  She has tried ibuprofen and Tylenol without real relief.  Review of systems: See HPI otherwise negative  Physical exam: General well-developed well-nourished female no acute distress mood affect appropriate.   Psych alert and oriented x3  Bilateral shoulders she has full overhead motion of both shoulders.  Positive impingement on the right negative on the left.  Negative empty can test bilaterally.  Negative liftoff test bilaterally.  5 out of 5 strength of external/internal rotation against resistance bilateral shoulders.  Radiographs: Right shoulder 3 views show no acute fractures or findings.  Shoulder is well located.  Postsurgical changes with distal clavicle resection.  Impression right shoulder impingement  Plan: Recommend cortisone injection right shoulder she is agreeable.  She is shown pendulum Codman and forward flexion exercises.  We will see her back in 2 weeks to see how she is doing overall.  Questions encouraged and answered at length.  Procedure Note  Procedures: Visit Diagnoses:  1. Acute pain of right shoulder     Large Joint Inj: R subacromial bursa on 09/08/2020 5:12 PM Indications: pain Details: 22 G 1.5 in needle, superior approach  Arthrogram: No  Medications: 3 mL lidocaine 1 %; 40 mg methylPREDNISolone acetate 40 MG/ML Outcome: tolerated well, no immediate complications Procedure, treatment alternatives, risks and benefits explained, specific risks discussed. Consent was given by the patient. Immediately prior to procedure a time out was called to  verify the correct patient, procedure, equipment, support staff and site/side marked as required. Patient was prepped and draped in the usual sterile fashion.
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
## 2020-09-10 ENCOUNTER — Other Ambulatory Visit (INDEPENDENT_AMBULATORY_CARE_PROVIDER_SITE_OTHER): Payer: Self-pay | Admitting: Family Medicine

## 2020-09-10 DIAGNOSIS — I10 Essential (primary) hypertension: Secondary | ICD-10-CM

## 2020-09-12 NOTE — Telephone Encounter (Signed)
Last OV with Dr Opalski 

## 2020-09-13 ENCOUNTER — Other Ambulatory Visit (INDEPENDENT_AMBULATORY_CARE_PROVIDER_SITE_OTHER): Payer: Self-pay | Admitting: Family Medicine

## 2020-09-13 DIAGNOSIS — I10 Essential (primary) hypertension: Secondary | ICD-10-CM

## 2020-09-22 ENCOUNTER — Other Ambulatory Visit (INDEPENDENT_AMBULATORY_CARE_PROVIDER_SITE_OTHER): Payer: Self-pay | Admitting: Family Medicine

## 2020-09-22 DIAGNOSIS — R7303 Prediabetes: Secondary | ICD-10-CM

## 2020-09-22 NOTE — Telephone Encounter (Signed)
Last OV with Dr Opalski 

## 2020-09-27 ENCOUNTER — Ambulatory Visit (INDEPENDENT_AMBULATORY_CARE_PROVIDER_SITE_OTHER): Payer: Medicare Other | Admitting: Physician Assistant

## 2020-09-29 ENCOUNTER — Ambulatory Visit: Payer: Medicare Other | Admitting: Physician Assistant

## 2020-09-29 ENCOUNTER — Encounter: Payer: Self-pay | Admitting: Physician Assistant

## 2020-09-29 DIAGNOSIS — M25511 Pain in right shoulder: Secondary | ICD-10-CM | POA: Diagnosis not present

## 2020-09-29 DIAGNOSIS — M7541 Impingement syndrome of right shoulder: Secondary | ICD-10-CM

## 2020-09-29 NOTE — Addendum Note (Signed)
Addended by: Barbette Or on: 09/29/2020 01:47 PM   Modules accepted: Orders

## 2020-09-29 NOTE — Progress Notes (Signed)
HPI: Crystal Jackson returns today for follow-up of her right shoulder.  She states that her right shoulder is now 75% better than it was prior to the injection 3 weeks ago.  On 09/08/2020 she underwent subacromial injection.  She states she still having pain particularly with overhead lifting.  She notes most of her pain is in the deltoid region.  She notes lifting any heavy objects causes her increased pain.  She has a occasional numbness down the right arm and hand but this is not daily.  She denies any neck pain.   Review of systems see HPI otherwise negative or noncontributory.  Physical exam General well-developed well-nourished female no acute distress. Bilateral shoulder she has full overhead activity with some discomfort in the right shoulder only.  Impingement testing positive on the right.  5 out of 5 strength throughout the upper extremities against resistance including rotator cuff with internal and external rotation, empty can test and liftoff test.  Nontender over the medial borders of the scapula.  Nontender cervical spine.  Impression: Right shoulder impingement  Plan: Recommend referral to physical therapy for right shoulder pain impingement.  Will work on range of motion strengthening, home exercise program and modalities.  She will follow-up with [name] in 6 weeks to see how she is doing overall.  Questions were encouraged and answered at length.

## 2020-10-03 ENCOUNTER — Ambulatory Visit (INDEPENDENT_AMBULATORY_CARE_PROVIDER_SITE_OTHER): Payer: Medicare Other | Admitting: Physician Assistant

## 2020-10-06 ENCOUNTER — Encounter (INDEPENDENT_AMBULATORY_CARE_PROVIDER_SITE_OTHER): Payer: Self-pay | Admitting: Physician Assistant

## 2020-10-06 ENCOUNTER — Ambulatory Visit (INDEPENDENT_AMBULATORY_CARE_PROVIDER_SITE_OTHER): Payer: Medicare Other | Admitting: Physician Assistant

## 2020-10-06 ENCOUNTER — Other Ambulatory Visit: Payer: Self-pay

## 2020-10-06 VITALS — BP 120/74 | HR 74 | Temp 97.7°F | Ht 64.0 in | Wt 217.0 lb

## 2020-10-06 DIAGNOSIS — Z6841 Body Mass Index (BMI) 40.0 and over, adult: Secondary | ICD-10-CM

## 2020-10-06 DIAGNOSIS — R7303 Prediabetes: Secondary | ICD-10-CM | POA: Diagnosis not present

## 2020-10-06 DIAGNOSIS — I10 Essential (primary) hypertension: Secondary | ICD-10-CM

## 2020-10-06 MED ORDER — CHLORTHALIDONE 25 MG PO TABS: ORAL_TABLET | ORAL | 0 refills | Status: DC
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
## 2020-10-26 ENCOUNTER — Ambulatory Visit: Payer: Medicare Other | Admitting: Physical Therapy

## 2020-10-27 ENCOUNTER — Encounter (INDEPENDENT_AMBULATORY_CARE_PROVIDER_SITE_OTHER): Payer: Self-pay | Admitting: Physician Assistant

## 2020-10-27 ENCOUNTER — Ambulatory Visit (INDEPENDENT_AMBULATORY_CARE_PROVIDER_SITE_OTHER): Payer: Medicare Other | Admitting: Physician Assistant

## 2020-10-27 ENCOUNTER — Other Ambulatory Visit: Payer: Self-pay

## 2020-10-27 VITALS — BP 113/72 | HR 70 | Temp 97.4°F | Ht 64.0 in | Wt 215.0 lb

## 2020-10-27 DIAGNOSIS — E559 Vitamin D deficiency, unspecified: Secondary | ICD-10-CM | POA: Diagnosis not present

## 2020-10-27 DIAGNOSIS — Z6841 Body Mass Index (BMI) 40.0 and over, adult: Secondary | ICD-10-CM

## 2020-10-27 NOTE — Progress Notes (Signed)
Chief Complaint:   OBESITY Falicia is here to discuss her progress with her obesity treatment plan along with follow-up of her obesity related diagnoses. Gisselle is on keeping a food journal and adhering to recommended goals of 1100-1200 calories and 85 protein and states she is following her eating plan approximately 50% of the time. Jaianna states she is walking 15 minutes 2 times per week.  Today's visit was #: 49 Starting weight: 242 lbs Starting date: 07/09/2018 Today's weight: 215 lbs Today's date: 10/27/2020 Total lbs lost to date: 27 Total lbs lost since last in-office visit: 2  Interim History: Chayce is surprised with her weight loss due to not following the plan closely. She states she has lost motivation and wants to eat what she wants. She is usually fine with breakfast and lunch but struggles with dinner.  Subjective:   1. Vitamin D deficiency Jerricka is on OTC Calcium with Vit D. Her last Vit D level was 44.7.  Assessment/Plan:   1. Vitamin D deficiency Low Vitamin D level contributes to fatigue and are associated with obesity, breast, and colon cancer. She agrees to continue to take OTC Calcium and Vitamin D and will follow-up for routine testing of Vitamin D, at least 2-3 times per year to avoid over-replacement.  2. Obesity with current BMI 36.89  Stefanee is currently in the action stage of change. As such, her goal is to continue with weight loss efforts. She has agreed to change to practicing portion control and making smarter food choices, such as increasing vegetables and decreasing simple carbohydrates.   Exercise goals:  As is  Behavioral modification strategies: avoiding temptations and planning for success.  Timiyah has agreed to follow-up with our clinic in 3 weeks. She was informed of the importance of frequent follow-up visits to maximize her success with intensive lifestyle modifications for her multiple health conditions.   Objective:   Blood  pressure 113/72, pulse 70, temperature (!) 97.4 F (36.3 C), height 5\' 4"  (1.626 m), weight 215 lb (97.5 kg), SpO2 100 %. Body mass index is 36.9 kg/m.  General: Cooperative, alert, well developed, in no acute distress. HEENT: Conjunctivae and lids unremarkable. Cardiovascular: Regular rhythm.  Lungs: Normal work of breathing. Neurologic: No focal deficits.   Lab Results  Component Value Date   CREATININE 0.92 06/29/2020   BUN 16 06/29/2020   NA 135 06/29/2020   K 4.2 06/29/2020   CL 94 (L) 06/29/2020   CO2 27 06/29/2020   Lab Results  Component Value Date   ALT 29 06/29/2020   AST 28 06/29/2020   ALKPHOS 49 06/29/2020   BILITOT 0.3 06/29/2020   Lab Results  Component Value Date   HGBA1C 5.6 06/29/2020   HGBA1C 5.4 10/19/2019   HGBA1C 5.3 05/27/2019   HGBA1C 5.4 11/10/2018   HGBA1C 5.8 (H) 07/09/2018   Lab Results  Component Value Date   INSULIN 17.7 06/29/2020   INSULIN 22.6 10/19/2019   INSULIN 16.2 05/27/2019   INSULIN 33.0 (H) 11/10/2018   INSULIN 25.0 (H) 07/09/2018   Lab Results  Component Value Date   TSH 2.210 07/09/2018   Lab Results  Component Value Date   CHOL 141 05/27/2019   HDL 55 05/27/2019   LDLCALC 69 05/27/2019   TRIG 88 05/27/2019   Lab Results  Component Value Date   VD25OH 44.7 06/29/2020   VD25OH 51.9 10/19/2019   VD25OH 90.2 11/10/2018   Lab Results  Component Value Date   WBC 6.0  07/09/2018   HGB 13.6 07/09/2018   HCT 39.7 07/09/2018   MCV 89 07/09/2018   PLT 241 07/09/2018   No results found for: IRON, TIBC, FERRITIN  Obesity Behavioral Intervention:   Approximately 15 minutes were spent on the discussion below.  ASK: We discussed the diagnosis of obesity with [facility] today and Golda agreed to give [name] permission to discuss obesity behavioral modification therapy today.  ASSESS: Hanah has the diagnosis of obesity and her BMI today is 36.9. Tulsi is in the action stage of change.   ADVISE: Belina was  educated on the multiple health risks of obesity as well as the benefit of weight loss to improve her health. She was advised of the need for long term treatment and the importance of lifestyle modifications to improve her current health and to decrease her risk of future health problems.  AGREE: Multiple dietary modification options and treatment options were discussed and Jnaya agreed to follow the recommendations documented in the above note.  ARRANGE: Seini was educated on the importance of frequent visits to treat obesity as outlined per CMS and USPSTF guidelines and agreed to schedule her next follow up appointment today.  Attestation Statements:   Reviewed by clinician on day of visit: allergies, medications, problem list, medical history, surgical history, family history, social history, and previous encounter notes.  Edmund Hilda, CMA, am acting as transcriptionist for [employer], PA-C.  I have reviewed the above documentation for accuracy and completeness, and I agree with the above. [name], PA-C

## 2020-10-28 ENCOUNTER — Ambulatory Visit: Payer: Medicare Other | Admitting: Physical Therapy

## 2020-10-28 ENCOUNTER — Encounter: Payer: Self-pay | Admitting: Pulmonary Disease

## 2020-10-28 ENCOUNTER — Ambulatory Visit: Payer: Medicare Other | Admitting: Pulmonary Disease

## 2020-10-28 VITALS — BP 124/72 | HR 66 | Ht 64.0 in | Wt 219.6 lb

## 2020-10-28 DIAGNOSIS — R059 Cough, unspecified: Secondary | ICD-10-CM

## 2020-10-28 DIAGNOSIS — G4733 Obstructive sleep apnea (adult) (pediatric): Secondary | ICD-10-CM | POA: Diagnosis not present

## 2020-10-28 DIAGNOSIS — J4530 Mild persistent asthma, uncomplicated: Secondary | ICD-10-CM | POA: Diagnosis not present

## 2020-10-28 MED ORDER — ARNUITY ELLIPTA 200 MCG/ACT IN AEPB: 1.0000 | INHALATION_SPRAY | Freq: Every day | RESPIRATORY_TRACT | 11 refills | Status: DC

## 2020-10-28 MED ORDER — ALBUTEROL SULFATE HFA 108 (90 BASE) MCG/ACT IN AERS: 1.0000 | INHALATION_SPRAY | Freq: Four times a day (QID) | RESPIRATORY_TRACT | 5 refills | Status: DC | PRN

## 2020-10-28 NOTE — Addendum Note (Signed)
Addended by: Maurene Capes on: 10/28/2020 04:26 PM   Modules accepted: Orders

## 2020-10-28 NOTE — Patient Instructions (Signed)
I am glad you are doing well with regard to your breathing Continue the inhaler Continue CPAP Follow-up in 1 year

## 2020-10-28 NOTE — Progress Notes (Signed)
Crystal Jackson    [id number]    05/28/1945  Primary Care Physician:Wile, Nyoka Cowden, MD  Referring Physician: Melida Quitter, MD [address] [place],  [place] [address]  Chief complaint:   Follow up for Upper airway chronic cough,  Asthmatic bronchitis. GERD OSA on auto CPAP   HPI: Crystal Jackson is a 75 year old with past medical history of high blood pressure. She had been diagnosed with bronchitis as a child. She reports increasing frequency of attacks of the past few years. Her latest episode was in Nov 2017  Her symptoms start with sinusitis postnasal drip which then proceeds to cough with yellow mucus, wheezing, chest congestion. She does not report any fevers or chills. She has seasonal allergies, rhinitis, postnasal drip. She has significant issues with acid reflux and heartburn and is on Protonix 40 mg twice a day. She was assessed by GI and was told she has a small hiatal hernia.  Symptoms have stabilized on Arnuity inhaler.  In 2019 she stopped it temporarily and noted increasing cough, dyspnea.  Interim History: Doing well with regard to breathing Continues on Arnuity, CPAP without issues    Outpatient Encounter Medications as of 10/28/2020  Medication Sig   albuterol (VENTOLIN HFA) 108 (90 Base) MCG/ACT inhaler Inhale into the lungs.   ARNUITY ELLIPTA 200 MCG/ACT AEPB INHALE 1 PUFF INTO THE LUNGS DAILY   Ascorbic Acid (VITAMIN C) 100 MG tablet Take 100 mg by mouth daily.   Calcium Carbonate-Vitamin D 600-400 MG-UNIT tablet 1 po qd   chlorpheniramine (CHLOR-TRIMETON) 4 MG tablet Take 1 tablet (4 mg total) by mouth every 6 (six) hours.   chlorthalidone (HYGROTON) 25 MG tablet Take 1/2 tab daily   clotrimazole-betamethasone (LOTRISONE) cream Qd for rash   dicyclomine (BENTYL) 20 MG tablet Take 20 mg by mouth 4 (four) times daily -  before meals and at bedtime. As needed   doxazosin (CARDURA) 1 MG tablet Take 1 mg by mouth daily.   Eszopiclone 3 MG TABS Take 2 mg by  mouth at bedtime as needed.   gabapentin (NEURONTIN) 100 MG capsule    Insulin Pen Needle 32G X 4 MM MISC Use once daily   Magnesium 500 MG CAPS Take by mouth.   metFORMIN (GLUCOPHAGE) 500 MG tablet Take 1 tablet (500 mg total) by mouth 2 (two) times daily with a meal. (with lunch and dinner)   metoprolol tartrate (LOPRESSOR) 25 MG tablet Take 25-50 mg by mouth 2 (two) times daily. 50 mg in the am and 50 mg at night   METRONIDAZOLE, TOPICAL, 0.75 % LOTN Apply topically.   Multiple Vitamin (MULTIVITAMIN) tablet Take 1 tablet by mouth daily.    Omega-3 Fatty Acids (FISH OIL) 500 MG CAPS Take by mouth.   pantoprazole (PROTONIX) 40 MG tablet Take 40 mg by mouth 2 (two) times daily.   potassium chloride (K-DUR) 10 MEQ tablet TAKE 2 TABLETS BY MOUTH TWICE DAILY   SUPER B COMPLEX/C PO Take by mouth.   triamcinolone cream (KENALOG) 0.1 % Apply topically.   VALACYCLOVIR HCL PO Take 500 mg by mouth 2 (two) times daily. For 3 days prn   venlafaxine XR (EFFEXOR-XR) 75 MG 24 hr capsule TK 3 CS PO D   Facility-Administered Encounter Medications as of 10/28/2020  Medication   methylPREDNISolone acetate (DEPO-MEDROL) injection 40 mg   Physical Exam: Blood pressure 124/72, pulse 66, height 5\' 4"  (1.626 m), weight 219 lb 9.6 oz (99.6 kg), SpO2 99 %.  Gen:      No acute distress HEENT:  EOMI, sclera anicteric Neck:     No masses; no thyromegaly Lungs:    Clear to auscultation bilaterally; normal respiratory effort CV:         Regular rate and rhythm; no murmurs Abd:      + bowel sounds; soft, non-tender; no palpable masses, no distension Ext:    No edema; adequate peripheral perfusion Skin:      Warm and dry; no rash Neuro: alert and oriented x 3 Psych: normal mood and affect   Data Reviewed: PFTs 7/44/17 FVC 2.54 [83%], FEV1 1.9 to [83%), F/F 76, TLC 89%, DLCO 72%. Minimal obstructive lung disease with mild reduction in diffusion capacity that corrects for alveolar volume.  Sleep study  07/20/15 Mild OSA, AHI 11.4. Low O2 sats of 71%.  CPAP download 09/23/2019 97% usage with residual AHI of 0.3, median pressure of 5.4  CXR 11/28/15- No acute cardiopulmonary disease. Images reviewed.  FENO 04/15/17-18  Assessment/Plan: Upper airway cough syndrome, Asthmatic bronchitis. Her PFTs show minimal obstruction. However there is marked reduction in mid flow rates indicating small airway disease with significant improvement post albuterol inhaler.   Continue current medication We had attempted trial of steroid inhaler in the past but she was not hence back on it Flonase, chlorpheniramine as needed  OSA Mild OSA on recent home sleep study.  Continues on AutoSet CPAP 5- 15  GERD Continues on protonix  Plan: - Continue chlorpheniramine, flonase as needed - Continue arnuity inhaler, albuterol PRN -  Autoset CPAP. Work on weight loss.  Chilton Greathouse MD [facility] 10/28/2020, 2:02 PM  CC: Melida Quitter, MD

## 2020-11-08 ENCOUNTER — Encounter: Payer: Medicare Other | Admitting: Physical Therapy

## 2020-11-10 ENCOUNTER — Ambulatory Visit: Payer: Medicare Other | Admitting: Orthopaedic Surgery

## 2020-11-15 ENCOUNTER — Encounter: Payer: Medicare Other | Admitting: Physical Therapy

## 2020-11-16 ENCOUNTER — Ambulatory Visit (INDEPENDENT_AMBULATORY_CARE_PROVIDER_SITE_OTHER): Payer: Medicare Other | Admitting: Physician Assistant

## 2020-11-21 ENCOUNTER — Encounter: Payer: Medicare Other | Admitting: Physical Therapy

## 2020-11-28 ENCOUNTER — Encounter (INDEPENDENT_AMBULATORY_CARE_PROVIDER_SITE_OTHER): Payer: Self-pay | Admitting: Family Medicine

## 2020-11-28 ENCOUNTER — Ambulatory Visit (INDEPENDENT_AMBULATORY_CARE_PROVIDER_SITE_OTHER): Payer: Medicare Other | Admitting: Family Medicine

## 2020-11-28 ENCOUNTER — Other Ambulatory Visit: Payer: Self-pay

## 2020-11-28 ENCOUNTER — Encounter: Payer: Medicare Other | Admitting: Physical Therapy

## 2020-11-28 VITALS — BP 113/71 | HR 75 | Temp 97.4°F | Ht 64.0 in | Wt 216.0 lb

## 2020-11-28 DIAGNOSIS — R7303 Prediabetes: Secondary | ICD-10-CM

## 2020-11-28 DIAGNOSIS — E559 Vitamin D deficiency, unspecified: Secondary | ICD-10-CM

## 2020-11-28 DIAGNOSIS — I10 Essential (primary) hypertension: Secondary | ICD-10-CM | POA: Diagnosis not present

## 2020-11-28 DIAGNOSIS — Z6841 Body Mass Index (BMI) 40.0 and over, adult: Secondary | ICD-10-CM

## 2020-11-28 NOTE — Progress Notes (Signed)
Chief Complaint:   OBESITY Crystal Jackson is here to discuss her progress with her obesity treatment plan along with follow-up of her obesity related diagnoses. Crystal Jackson is on practicing portion control and making smarter food choices, such as increasing vegetables and decreasing simple carbohydrates and states she is following her eating plan approximately 75% of the time. Crystal Jackson states she is walking for 15 minutes 1 time per week.  Today's visit was #: 50 Starting weight: 242 lbs Starting date: 07/09/2018 Today's weight: 216 lbs Today's date: 11/28/2020 Total lbs lost to date: 26 Total lbs lost since last in-office visit: 0  Interim History: Crystal Jackson is very happy with her 1 lb weight gain over Thanksgiving.  Subjective:   1. Pre-diabetes Crystal Jackson's A1c and other labs were recently obtained by her primary care physician last week. Her A1c is down to 5.2. She is on metformin.  2. Essential hypertension Crystal Jackson's blood pressure is at goal. She is taking Cardura, chlorthalidone, and Lopressor.   BP Readings from Last 3 Encounters:  11/28/20 113/71  10/28/20 124/72  10/27/20 113/72   3. Vitamin D deficiency Crystal Jackson is currently taking OTC vitamin D supplementation. Last Vit D level was at goal. She denies nausea, vomiting or muscle weakness.  Assessment/Plan:  No orders of the defined types were placed in this encounter.   There are no discontinued medications.   No orders of the defined types were placed in this encounter.    1. Pre-diabetes Crystal Jackson will continue metformin, and she declines BMP, mag, B12 blood work today. She will continue to work on weight loss, exercise, and decreasing simple carbohydrates to help decrease the risk of diabetes.   2. Essential hypertension Crystal Jackson will continue her medications, and prudent nutritional plan, and will follow up as she continues her lifestyle modifications.  3. Vitamin D deficiency Low Vitamin D level contributes to fatigue  and are associated with obesity, breast, and colon cancer. Crystal Jackson will continue OTC Vitamin D and will follow-up for routine testing of Vitamin D, at least 2-3 times per year to avoid over-replacement.  4. Obesity with current BMI of 37.2 Crystal Jackson is currently in the action stage of change. As such, her goal is to continue with weight loss efforts. She has agreed to practicing portion control and making smarter food choices, such as increasing vegetables and decreasing simple carbohydrates.   Crystal Jackson needs blood work done at her next office visit, and she is to bring in a copy of all recent labs. Also we discussed G.Hughes sauces and marinades as well as healthy ways to make her favorite foods.  Exercise goals: All adults should avoid inactivity. Some physical activity is better than none, and adults who participate in any amount of physical activity gain some health benefits.  Behavioral modification strategies: holiday eating strategies  and planning for success.  Crystal Jackson has agreed to follow-up with our clinic in 3 weeks. She was informed of the importance of frequent follow-up visits to maximize her success with intensive lifestyle modifications for her multiple health conditions.   Objective:   Blood pressure 113/71, pulse 75, temperature (!) 97.4 F (36.3 C), height 5\' 4"  (1.626 m), weight 216 lb (98 kg), SpO2 96 %. Body mass index is 37.08 kg/m.  General: Cooperative, alert, well developed, in no acute distress. HEENT: Conjunctivae and lids unremarkable. Cardiovascular: Regular rhythm.  Lungs: Normal work of breathing. Neurologic: No focal deficits.   Lab Results  Component Value Date   CREATININE 0.92 06/29/2020   BUN  16 06/29/2020   NA 135 06/29/2020   K 4.2 06/29/2020   CL 94 (L) 06/29/2020   CO2 27 06/29/2020   Lab Results  Component Value Date   ALT 29 06/29/2020   AST 28 06/29/2020   ALKPHOS 49 06/29/2020   BILITOT 0.3 06/29/2020   Lab Results  Component Value  Date   HGBA1C 5.6 06/29/2020   HGBA1C 5.4 10/19/2019   HGBA1C 5.3 05/27/2019   HGBA1C 5.4 11/10/2018   HGBA1C 5.8 (H) 07/09/2018   Lab Results  Component Value Date   INSULIN 17.7 06/29/2020   INSULIN 22.6 10/19/2019   INSULIN 16.2 05/27/2019   INSULIN 33.0 (H) 11/10/2018   INSULIN 25.0 (H) 07/09/2018   Lab Results  Component Value Date   TSH 2.210 07/09/2018   Lab Results  Component Value Date   CHOL 141 05/27/2019   HDL 55 05/27/2019   LDLCALC 69 05/27/2019   TRIG 88 05/27/2019   Lab Results  Component Value Date   VD25OH 44.7 06/29/2020   VD25OH 51.9 10/19/2019   VD25OH 90.2 11/10/2018   Lab Results  Component Value Date   WBC 6.0 07/09/2018   HGB 13.6 07/09/2018   HCT 39.7 07/09/2018   MCV 89 07/09/2018   PLT 241 07/09/2018   No results found for: IRON, TIBC, FERRITIN  Obesity Behavioral Intervention:   Approximately 15 minutes were spent on the discussion below.  ASK: We discussed the diagnosis of obesity with Crystal Jackson today and Crystal Jackson agreed to give [name] permission to discuss obesity behavioral modification therapy today.  ASSESS: Crystal Jackson has the diagnosis of obesity and her BMI today is 37.2. Crystal Jackson is in the action stage of change.   ADVISE: Crystal Jackson was educated on the multiple health risks of obesity as well as the benefit of weight loss to improve her health. She was advised of the need for long term treatment and the importance of lifestyle modifications to improve her current health and to decrease her risk of future health problems.  AGREE: Multiple dietary modification options and treatment options were discussed and Crystal Jackson agreed to follow the recommendations documented in the above note.  ARRANGE: Crystal Jackson was educated on the importance of frequent visits to treat obesity as outlined per CMS and USPSTF guidelines and agreed to schedule her next follow up appointment today.  Attestation Statements:   Reviewed by clinician on day of visit:  allergies, medications, problem list, medical history, surgical history, family history, social history, and previous encounter notes.   Wilhemena Durie, am acting as transcriptionist for [employer], DO.  I have reviewed the above documentation for accuracy and completeness, and I agree with the above. Crystal Jackson, D.O.  The [address] was signed into law in 2016 which includes the topic of electronic health records.  This provides immediate access to information in MyChart.  This includes consultation notes, operative notes, office notes, lab results and pathology reports.  If you have any questions about what you read please let us know at your next visit so we can discuss your concerns and take corrective action if need be.  We are right here with you.

## 2020-12-05 ENCOUNTER — Encounter: Payer: Medicare Other | Admitting: Physical Therapy

## 2020-12-07 ENCOUNTER — Ambulatory Visit: Payer: Medicare Other | Admitting: Orthopaedic Surgery

## 2020-12-17 ENCOUNTER — Other Ambulatory Visit (INDEPENDENT_AMBULATORY_CARE_PROVIDER_SITE_OTHER): Payer: Self-pay | Admitting: Family Medicine

## 2020-12-17 DIAGNOSIS — R7303 Prediabetes: Secondary | ICD-10-CM

## 2020-12-19 ENCOUNTER — Other Ambulatory Visit: Payer: Self-pay

## 2020-12-19 ENCOUNTER — Other Ambulatory Visit (INDEPENDENT_AMBULATORY_CARE_PROVIDER_SITE_OTHER): Payer: Self-pay | Admitting: Physician Assistant

## 2020-12-19 ENCOUNTER — Encounter (INDEPENDENT_AMBULATORY_CARE_PROVIDER_SITE_OTHER): Payer: Self-pay | Admitting: Physician Assistant

## 2020-12-19 ENCOUNTER — Ambulatory Visit (INDEPENDENT_AMBULATORY_CARE_PROVIDER_SITE_OTHER): Payer: Medicare Other | Admitting: Physician Assistant

## 2020-12-19 VITALS — BP 143/82 | HR 75 | Temp 97.7°F | Ht 64.0 in | Wt 216.0 lb

## 2020-12-19 DIAGNOSIS — Z6841 Body Mass Index (BMI) 40.0 and over, adult: Secondary | ICD-10-CM | POA: Diagnosis not present

## 2020-12-19 DIAGNOSIS — R7303 Prediabetes: Secondary | ICD-10-CM

## 2020-12-19 DIAGNOSIS — I10 Essential (primary) hypertension: Secondary | ICD-10-CM | POA: Diagnosis not present

## 2020-12-19 MED ORDER — CHLORTHALIDONE 25 MG PO TABS: ORAL_TABLET | ORAL | 0 refills | Status: DC

## 2020-12-19 MED ORDER — METFORMIN HCL 500 MG PO TABS: 500.0000 mg | ORAL_TABLET | Freq: Two times a day (BID) | ORAL | 0 refills | Status: DC

## 2020-12-19 NOTE — Progress Notes (Signed)
Chief Complaint:   OBESITY Crystal Jackson is here to discuss her progress with her obesity treatment plan along with follow-up of her obesity related diagnoses. Crystal Jackson is on practicing portion control and making smarter food choices, such as increasing vegetables and decreasing simple carbohydrates and states she is following her eating plan approximately 50% of the time. Crystal Jackson states she is doing 0 minutes 0 times per week.  Today's visit was #: 51 Starting weight: 242 lbs Starting date: 07/09/2018 Today's weight: 216 lbs Today's date: 12/19/2020 Total lbs lost to date: 26 lbs Total lbs lost since last in-office visit: 0  Interim History: Crystal Jackson reports making cookies over the holidays and indulging in them. With it getting darker earlier, she is not walking as much.   Subjective:   1. Essential hypertension Crystal Jackson denies chest pain and headaches. She is on Metoprolol and chlorthalidone.   2. Pre-diabetes Crystal Jackson's last A1C was 5.2. She reports that Metformin has helped to decreased her cravings and her appetite.   Assessment/Plan:   1. Essential hypertension We will refill Chlorthalidone with no refills. Crystal Jackson is working on healthy weight loss and exercise to improve blood pressure control. We will watch for signs of hypotension as she continues her lifestyle modifications.  - chlorthalidone (HYGROTON) 25 MG tablet; Take 1/2 tab daily  Dispense: 45 tablet; Refill: 0  2. Pre-diabetes We will refill Metformin for 1 month with no refills. Crystal Jackson will continue to work on weight loss, exercise, and decreasing simple carbohydrates to help decrease the risk of diabetes.   - metFORMIN (GLUCOPHAGE) 500 MG tablet; Take 1 tablet (500 mg total) by mouth 2 (two) times daily with a meal. (with lunch and dinner)  Dispense: 60 tablet; Refill: 0  3. Obesity with current BMI of 37.06 Crystal Jackson is currently in the action stage of change. As such, her goal is to continue with weight loss  efforts. She has agreed change to the Category 2 Plan.   Exercise goals: No exercise has been prescribed at this time.  Behavioral modification strategies: decreasing simple carbohydrates and meal planning and cooking strategies.  Crystal Jackson has agreed to follow-up with our clinic in 3 weeks. She was informed of the importance of frequent follow-up visits to maximize her success with intensive lifestyle modifications for her multiple health conditions.   Objective:   Blood pressure (!) 143/82, pulse 75, temperature 97.7 F (36.5 C), height 5\' 4"  (1.626 m), weight 216 lb (98 kg), SpO2 98 %. Body mass index is 37.08 kg/m.  General: Cooperative, alert, well developed, in no acute distress. HEENT: Conjunctivae and lids unremarkable. Cardiovascular: Regular rhythm.  Lungs: Normal work of breathing. Neurologic: No focal deficits.   Lab Results  Component Value Date   CREATININE 0.92 06/29/2020   BUN 16 06/29/2020   NA 135 06/29/2020   K 4.2 06/29/2020   CL 94 (L) 06/29/2020   CO2 27 06/29/2020   Lab Results  Component Value Date   ALT 29 06/29/2020   AST 28 06/29/2020   ALKPHOS 49 06/29/2020   BILITOT 0.3 06/29/2020   Lab Results  Component Value Date   HGBA1C 5.6 06/29/2020   HGBA1C 5.4 10/19/2019   HGBA1C 5.3 05/27/2019   HGBA1C 5.4 11/10/2018   HGBA1C 5.8 (H) 07/09/2018   Lab Results  Component Value Date   INSULIN 17.7 06/29/2020   INSULIN 22.6 10/19/2019   INSULIN 16.2 05/27/2019   INSULIN 33.0 (H) 11/10/2018   INSULIN 25.0 (H) 07/09/2018   Lab Results  Component Value Date   TSH 2.210 07/09/2018   Lab Results  Component Value Date   CHOL 141 05/27/2019   HDL 55 05/27/2019   LDLCALC 69 05/27/2019   TRIG 88 05/27/2019   Lab Results  Component Value Date   VD25OH 44.7 06/29/2020   VD25OH 51.9 10/19/2019   VD25OH 90.2 11/10/2018   Lab Results  Component Value Date   WBC 6.0 07/09/2018   HGB 13.6 07/09/2018   HCT 39.7 07/09/2018   MCV 89 07/09/2018    PLT 241 07/09/2018   No results found for: IRON, TIBC, FERRITIN  Obesity Behavioral Intervention:   Approximately 15 minutes were spent on the discussion below.  ASK: We discussed the diagnosis of obesity with Crystal Jackson today and Crystal Jackson agreed to give [name] permission to discuss obesity behavioral modification therapy today.  ASSESS: Crystal Jackson has the diagnosis of obesity and her BMI today is 37.1. Crystal Jackson is in the action stage of change.   ADVISE: Crystal Jackson was educated on the multiple health risks of obesity as well as the benefit of weight loss to improve her health. She was advised of the need for long term treatment and the importance of lifestyle modifications to improve her current health and to decrease her risk of future health problems.  AGREE: Multiple dietary modification options and treatment options were discussed and Crystal Jackson agreed to follow the recommendations documented in the above note.  ARRANGE: Crystal Jackson was educated on the importance of frequent visits to treat obesity as outlined per CMS and USPSTF guidelines and agreed to schedule her next follow up appointment today.  Attestation Statements:   Reviewed by clinician on day of visit: allergies, medications, problem list, medical history, surgical history, family history, social history, and previous encounter notes.  I, Tonye Pearson, am acting as Location manager for [employer], PA-C.  I have reviewed the above documentation for accuracy and completeness, and I agree with the above. Abby Potash, PA-C

## 2020-12-19 NOTE — Telephone Encounter (Signed)
[name] 

## 2020-12-20 ENCOUNTER — Ambulatory Visit: Payer: Self-pay

## 2020-12-20 ENCOUNTER — Encounter: Payer: Self-pay | Admitting: Physician Assistant

## 2020-12-20 ENCOUNTER — Ambulatory Visit: Payer: Medicare Other | Admitting: Physician Assistant

## 2020-12-20 DIAGNOSIS — M1711 Unilateral primary osteoarthritis, right knee: Secondary | ICD-10-CM | POA: Diagnosis not present

## 2020-12-20 DIAGNOSIS — M1712 Unilateral primary osteoarthritis, left knee: Secondary | ICD-10-CM | POA: Diagnosis not present

## 2020-12-20 MED ORDER — METHYLPREDNISOLONE ACETATE 40 MG/ML IJ SUSP
40.0000 mg | INTRAMUSCULAR | Status: AC | PRN
Start: 2020-12-20 — End: 2020-12-20
  Administered 2020-12-20: 18:00:00 40 mg via INTRA_ARTICULAR

## 2020-12-20 MED ORDER — LIDOCAINE HCL 1 % IJ SOLN
3.0000 mL | INTRAMUSCULAR | Status: AC | PRN
  Administered 2020-12-20: 18:00:00 3 mL

## 2020-12-20 MED ORDER — LIDOCAINE HCL 1 % IJ SOLN
3.0000 mL | INTRAMUSCULAR | Status: AC | PRN
Start: 2020-12-20 — End: 2020-12-20
  Administered 2020-12-20: 18:00:00 3 mL

## 2020-12-20 NOTE — Progress Notes (Signed)
Office Visit Note   [name]           Date of Birth: 01-25-1945           MRN: [id number] Visit Date: 12/20/2020              Requested by: Melida Quitter, MD [address] [place],  [place] [address] PCP: Melida Quitter, MD   Assessment & Plan: Visit Diagnoses:  1. Primary osteoarthritis of left knee   2. Primary osteoarthritis of right knee     Plan: Given patient's continued pain in both knees and the fact that she has had supplemental injections in the past to help recommend repeat supplemental injections both knees.  We will have her back once these are available.  She will continue work on Dance movement psychotherapist.  Follow-Up Instructions: Return for Supplemental injection.   Orders:  Orders Placed This Encounter  Procedures   Large Joint Inj   XR Knee 1-2 Views Right   XR Knee 1-2 Views Left   No orders of the defined types were placed in this encounter.     Procedures: Large Joint Inj: bilateral knee on 12/20/2020 5:52 PM Indications: pain Details: 22 G 1.5 in needle, anterolateral approach  Arthrogram: No  Medications (Right): 3 mL lidocaine 1 %; 40 mg methylPREDNISolone acetate 40 MG/ML Medications (Left): 3 mL lidocaine 1 %; 40 mg methylPREDNISolone acetate 40 MG/ML Outcome: tolerated well, no immediate complications Procedure, treatment alternatives, risks and benefits explained, specific risks discussed. Consent was given by the patient. Immediately prior to procedure a time out was called to verify the correct patient, procedure, equipment, support staff and site/side marked as required. Patient was prepped and draped in the usual sterile fashion.      Clinical Data: No additional findings.   Subjective: Chief Complaint  Patient presents with   Right Knee - Pain   Left Knee - Pain    HPI Crystal Jackson is 75 year old female comes in today with bilateral knee pain.  She is known to Dr. Eliberto Ivory service.  She has had no new injury to either  knee.  She states she has had pain in both knees for at least a month.  The last injection left knee was 05/02/2020 which gave her some relief.  She is prediabetic but states her A1c is in around 5.6.  She also has been seen for her shoulder in the past and states that her shoulder improved with conservative treatment she has no pain in her shoulder at this point time. Review of Systems See HPI otherwise negative  Objective: Vital Signs: There were no vitals taken for this visit.  Physical Exam Constitutional:      Appearance: She is not ill-appearing or diaphoretic.  Pulmonary:     Effort: Pulmonary effort is normal.  Neurological:     Mental Status: She is alert and oriented to person, place, and time.  Psychiatric:        Mood and Affect: Mood normal.    Ortho Exam Bilateral knees no abnormal warmth erythema or effusion.  Patellofemoral crepitus bilateral knees.  No instability valgus varus erythema of either knee.  Valgus deformity bilateral knees.  Good range of motion both knees. Specialty Comments:  No specialty comments available.  Imaging: XR Knee 1-2 Views Left  Result Date: 12/20/2020 Left knee 2 views: Valgus deformity with moderate arthritis involving the lateral compartment.  Severe patellofemoral changes.  Knee is well located.  XR Knee 1-2 Views Right  Result Date: 12/20/2020 Right knee 2 views: Moderate lateral compartmental narrowing.  Severe patellofemoral arthritic changes.  Knee is well located.  No acute fractures.    PMFS History: Patient Active Problem List   Diagnosis Date Noted   OSA (obstructive sleep apnea) 09/03/2018   Mild persistent asthma without complication 09/03/2018   Severe obesity (BMI >= 40) (HCC) 04/14/2018   Unilateral primary osteoarthritis, left knee 04/14/2018   Asthma in adult, mild intermittent, uncomplicated 02/26/2017   Allergic rhinitis 09/05/2013   Past Medical History:  Diagnosis Date   Anxiety    Arthritis    Asthma     Back pain    Bilateral swelling of feet    Constipation    Depression    Gallbladder problem    GERD (gastroesophageal reflux disease)    History of low potassium    Hypertension    Joint pain    Obesity    Palpitations    Shortness of breath    Sleep apnea     Family History  Problem Relation Age of Onset   Diabetes Mother    Heart disease Mother    Anxiety disorder Mother    Obesity Mother    Diabetes Father    Hypertension Father    Obesity Father     Past Surgical History:  Procedure Laterality Date   ABDOMINAL HYSTERECTOMY     APPENDECTOMY     CHOLECYSTECTOMY     KNEE SURGERY Left    SHOULDER SURGERY Right    Social History   Occupational History   Not on file  Tobacco Use   Smoking status: Never   Smokeless tobacco: Never  Substance and Sexual Activity   Alcohol use: No    Alcohol/week: 0.0 standard drinks   Drug use: No   Sexual activity: Not on file

## 2020-12-21 ENCOUNTER — Telehealth: Payer: Self-pay

## 2020-12-21 NOTE — Telephone Encounter (Signed)
Noted  

## 2020-12-21 NOTE — Telephone Encounter (Signed)
Please get auth for bilateral knee gel injection-Gil pt

## 2020-12-29 ENCOUNTER — Telehealth: Payer: Self-pay | Admitting: Pulmonary Disease

## 2020-12-29 MED ORDER — PAXLOVID (300/100) 20 X 150 MG & 10 X 100MG PO TBPK: 2.0000 | ORAL_TABLET | Freq: Two times a day (BID) | ORAL | 0 refills | Status: AC

## 2020-12-29 NOTE — Telephone Encounter (Signed)
Patient is returning a call. °

## 2020-12-29 NOTE — Telephone Encounter (Signed)
Called and spoke to pt. Pt c/o cough with little mucus production but unable to expectorate to see color of mucus, increase in SOB, weakness, chest tightness x 4 days. Pt denies wheezing, f/c/s. Pt took covid test while on the phone and was positive. Pt vaccinated against covid and booster and has had flu shot. Pt was not in distress while on the phone and was able to complete full sentences without audible dyspnea. Pt was not taking Arnuity consistently last week. Pt was advised to take it regularly every day. Pt is taking sudafed. Pt last see in 10/2020 for asthma and osa on cpap.   Dr. Isaiah Serge, please advise. Thanks.

## 2020-12-29 NOTE — Telephone Encounter (Signed)
Called and spoke to pt. Informed her of the recs per Dr. Mannam. Pt verbalized understanding and denied any further questions or concerns at this time.   

## 2020-12-29 NOTE — Telephone Encounter (Signed)
I sent an order for paxlovid to her pharmacy

## 2020-12-29 NOTE — Telephone Encounter (Signed)
Lm x1 for patient.  

## 2021-01-09 ENCOUNTER — Ambulatory Visit (INDEPENDENT_AMBULATORY_CARE_PROVIDER_SITE_OTHER): Payer: Medicare Other | Admitting: Family Medicine

## 2021-01-19 ENCOUNTER — Ambulatory Visit (INDEPENDENT_AMBULATORY_CARE_PROVIDER_SITE_OTHER): Payer: Medicare Other | Admitting: Family Medicine

## 2021-01-19 ENCOUNTER — Encounter (INDEPENDENT_AMBULATORY_CARE_PROVIDER_SITE_OTHER): Payer: Self-pay | Admitting: Family Medicine

## 2021-01-19 ENCOUNTER — Other Ambulatory Visit: Payer: Self-pay

## 2021-01-19 VITALS — BP 110/71 | HR 67 | Temp 97.7°F | Ht 64.0 in | Wt 214.0 lb

## 2021-01-19 DIAGNOSIS — Z6836 Body mass index (BMI) 36.0-36.9, adult: Secondary | ICD-10-CM

## 2021-01-19 DIAGNOSIS — R7303 Prediabetes: Secondary | ICD-10-CM

## 2021-01-19 DIAGNOSIS — I10 Essential (primary) hypertension: Secondary | ICD-10-CM | POA: Diagnosis not present

## 2021-01-19 MED ORDER — METFORMIN HCL 500 MG PO TABS: 500.0000 mg | ORAL_TABLET | Freq: Two times a day (BID) | ORAL | 0 refills | Status: DC

## 2021-01-19 NOTE — Progress Notes (Signed)
Chief Complaint:   OBESITY Crystal Jackson is here to discuss her progress with her obesity treatment plan along with follow-up of her obesity related diagnoses. Crystal Jackson is on the Category 2 Plan and states she is following her eating plan approximately 50% of the time. Crystal Jackson states she is not currently exercising.  Today's visit was #: 64 Starting weight: 242 lbs Starting date: 07/09/2018 Today's weight: 214 lbs Today's date: 01/19/2021 Total lbs lost to date: 28 Total lbs lost since last in-office visit: 2  Interim History: Crystal Jackson and her husband got COVID between Christmas and New Years. She still has a cough, SOB/winded easily, and low energy. She is also not as active and not able to be.  Subjective:   1. Essential hypertension BP stable and at goal.   2. Pre-diabetes Crystal Jackson reports Metformin has worked very well for her hunger and cravings.  Assessment/Plan:  No orders of the defined types were placed in this encounter.   Medications Discontinued During This Encounter  Medication Reason   metFORMIN (GLUCOPHAGE) 500 MG tablet Reorder     Meds ordered this encounter  Medications   metFORMIN (GLUCOPHAGE) 500 MG tablet    Sig: Take 1 tablet (500 mg total) by mouth 2 (two) times daily with a meal. (with lunch and dinner)    Dispense:  60 tablet    Refill:  0    30 d supply; ov for RF     1. Essential hypertension Crystal Jackson is working on healthy weight loss and exercise to improve blood pressure control. We will watch for signs of hypotension as she continues her lifestyle modifications. Continue current treatment plan.  2. Pre-diabetes Crystal Jackson will continue to work on weight loss, exercise, and decreasing simple carbohydrates to help decrease the risk of diabetes.   Refill- metFORMIN (GLUCOPHAGE) 500 MG tablet; Take 1 tablet (500 mg total) by mouth 2 (two) times daily with a meal. (with lunch and dinner)  Dispense: 60 tablet; Refill: 0  3. Obesity with current BMI of  36.7  Crystal Jackson is currently in the action stage of change. As such, her goal is to continue with weight loss efforts. She has agreed to the Category 2 Plan.   Crystal Jackson will bring in labs from PCP's office for the past 6 months to next OV. Check labs not recently done at next OV.  Exercise goals:  As is  Behavioral modification strategies: planning for success.  Crystal Jackson has agreed to follow-up with our clinic in 3 weeks, fasting. She was informed of the importance of frequent follow-up visits to maximize her success with intensive lifestyle modifications for her multiple health conditions.   Objective:   Blood pressure 110/71, pulse 67, temperature 97.7 F (36.5 C), height 5\' 4"  (1.626 m), weight 214 lb (97.1 kg), SpO2 97 %. Body mass index is 36.73 kg/m.  General: Cooperative, alert, well developed, in no acute distress. HEENT: Conjunctivae and lids unremarkable. Cardiovascular: Regular rhythm.  Lungs: Normal work of breathing. Neurologic: No focal deficits.   Lab Results  Component Value Date   CREATININE 0.92 06/29/2020   BUN 16 06/29/2020   NA 135 06/29/2020   K 4.2 06/29/2020   CL 94 (L) 06/29/2020   CO2 27 06/29/2020   Lab Results  Component Value Date   ALT 29 06/29/2020   AST 28 06/29/2020   ALKPHOS 49 06/29/2020   BILITOT 0.3 06/29/2020   Lab Results  Component Value Date   HGBA1C 5.6 06/29/2020   HGBA1C 5.4 10/19/2019  HGBA1C 5.3 05/27/2019   HGBA1C 5.4 11/10/2018   HGBA1C 5.8 (H) 07/09/2018   Lab Results  Component Value Date   INSULIN 17.7 06/29/2020   INSULIN 22.6 10/19/2019   INSULIN 16.2 05/27/2019   INSULIN 33.0 (H) 11/10/2018   INSULIN 25.0 (H) 07/09/2018   Lab Results  Component Value Date   TSH 2.210 07/09/2018   Lab Results  Component Value Date   CHOL 141 05/27/2019   HDL 55 05/27/2019   LDLCALC 69 05/27/2019   TRIG 88 05/27/2019   Lab Results  Component Value Date   VD25OH 44.7 06/29/2020   VD25OH 51.9 10/19/2019   VD25OH 90.2  11/10/2018   Lab Results  Component Value Date   WBC 6.0 07/09/2018   HGB 13.6 07/09/2018   HCT 39.7 07/09/2018   MCV 89 07/09/2018   PLT 241 07/09/2018   No results found for: IRON, TIBC, FERRITIN  Obesity Behavioral Intervention:   Approximately 15 minutes were spent on the discussion below.  ASK: We discussed the diagnosis of obesity with Crystal Jackson today and Crystal Jackson agreed to give [name] permission to discuss obesity behavioral modification therapy today.  ASSESS: Crystal Jackson has the diagnosis of obesity and her BMI today is 36.7. Crystal Jackson is in the action stage of change.   ADVISE: Crystal Jackson was educated on the multiple health risks of obesity as well as the benefit of weight loss to improve her health. She was advised of the need for long term treatment and the importance of lifestyle modifications to improve her current health and to decrease her risk of future health problems.  AGREE: Multiple dietary modification options and treatment options were discussed and Crystal Jackson agreed to follow the recommendations documented in the above note.  ARRANGE: Crystal Jackson was educated on the importance of frequent visits to treat obesity as outlined per CMS and USPSTF guidelines and agreed to schedule her next follow up appointment today.  Attestation Statements:   Reviewed by clinician on day of visit: allergies, medications, problem list, medical history, surgical history, family history, social history, and previous encounter notes.  Coral Ceo, CMA, am acting as transcriptionist for [employer], DO.  I have reviewed the above documentation for accuracy and completeness, and I agree with the above. Marjory Sneddon, D.O.  The [place] was signed into law in 2016 which includes the topic of electronic health records.  This provides immediate access to information in MyChart.  This includes consultation notes, operative notes, office notes, lab results and pathology  reports.  If you have any questions about what you read please let us know at your next visit so we can discuss your concerns and take corrective action if need be.  We are right here with you.

## 2021-01-23 ENCOUNTER — Telehealth: Payer: Self-pay

## 2021-01-23 NOTE — Telephone Encounter (Signed)
Patient called into the office and would like to know the status of her gel injection? Please advise

## 2021-01-23 NOTE — Telephone Encounter (Signed)
Talked with patient concerning gel injection.  Appt.has been scheduled 

## 2021-01-23 NOTE — Telephone Encounter (Signed)
Please advise 

## 2021-01-23 NOTE — Telephone Encounter (Signed)
VOB submitted for Durolane, bilateral knee. BV pending. 

## 2021-01-27 ENCOUNTER — Telehealth: Payer: Self-pay

## 2021-01-27 NOTE — Telephone Encounter (Signed)
Approved for Durolane, bilateral knee. [place] Patient will be responsible for 20% OOP. Co-pay of $30.00 No PA required

## 2021-01-30 ENCOUNTER — Ambulatory Visit: Payer: Medicare Other | Admitting: Physician Assistant

## 2021-01-30 ENCOUNTER — Encounter: Payer: Self-pay | Admitting: Physician Assistant

## 2021-01-30 ENCOUNTER — Other Ambulatory Visit: Payer: Self-pay

## 2021-01-30 DIAGNOSIS — M1712 Unilateral primary osteoarthritis, left knee: Secondary | ICD-10-CM | POA: Diagnosis not present

## 2021-01-30 DIAGNOSIS — M1711 Unilateral primary osteoarthritis, right knee: Secondary | ICD-10-CM

## 2021-01-30 MED ORDER — LIDOCAINE HCL 1 % IJ SOLN
3.0000 mL | INTRAMUSCULAR | Status: AC | PRN
  Administered 2021-01-30: 3 mL

## 2021-01-30 MED ORDER — SODIUM HYALURONATE 60 MG/3ML IX PRSY
60.0000 mg | PREFILLED_SYRINGE | INTRA_ARTICULAR | Status: AC | PRN
  Administered 2021-01-30: 60 mg via INTRA_ARTICULAR

## 2021-01-30 NOTE — Progress Notes (Signed)
° °  Procedure Note  [name]             Date of Birth: Nov 10, 1945           MRN: [id number]             Visit Date: 01/30/2021  HPI: Crystal Jackson returns today for scheduled Durolane injections bilateral knees.  She has had any new injury to either knee.  She is well-known to our department service.  She has known arthritis in both knees.  She has severe patellofemoral changes on radiographs.  She has failed conservative treatment otherwise both knees.  She has no scheduled knee surgery next 6 months.  Physical exam: Bilateral knees no abnormal warmth erythema.  Slight effusion left knee only.  Good range of motion both knees otherwise.  Procedures: Visit Diagnoses:  1. Primary osteoarthritis of left knee   2. Primary osteoarthritis of right knee     Large Joint Inj: bilateral knee on 01/30/2021 3:15 PM Indications: pain Details: 22 G 1.5 in needle, superolateral approach  Arthrogram: No  Medications (Right): 3 mL lidocaine 1 %; 60 mg Sodium Hyaluronate 60 MG/3ML Medications (Left): 3 mL lidocaine 1 %; 60 mg Sodium Hyaluronate 60 MG/3ML Aspirate (Left): 2 mL yellow Outcome: tolerated well, no immediate complications Procedure, treatment alternatives, risks and benefits explained, specific risks discussed. Consent was given by the patient. Immediately prior to procedure a time out was called to verify the correct patient, procedure, equipment, support staff and site/side marked as required. Patient was prepped and draped in the usual sterile fashion.   Plan: She will ice both knees this evening and take Tylenol ibuprofen as needed for the knee pain.  She understands to wait at least 6 months between supplemental injections.  She will work on Dance movement psychotherapist.  Questions were encouraged and answered.

## 2021-02-07 ENCOUNTER — Other Ambulatory Visit: Payer: Self-pay

## 2021-02-07 ENCOUNTER — Encounter (INDEPENDENT_AMBULATORY_CARE_PROVIDER_SITE_OTHER): Payer: Self-pay | Admitting: Physician Assistant

## 2021-02-07 ENCOUNTER — Ambulatory Visit (INDEPENDENT_AMBULATORY_CARE_PROVIDER_SITE_OTHER): Payer: Medicare Other | Admitting: Physician Assistant

## 2021-02-07 VITALS — BP 141/71 | HR 68 | Temp 97.5°F | Ht 64.0 in | Wt 216.0 lb

## 2021-02-07 DIAGNOSIS — E559 Vitamin D deficiency, unspecified: Secondary | ICD-10-CM | POA: Diagnosis not present

## 2021-02-07 DIAGNOSIS — E7849 Other hyperlipidemia: Secondary | ICD-10-CM

## 2021-02-07 DIAGNOSIS — Z6841 Body Mass Index (BMI) 40.0 and over, adult: Secondary | ICD-10-CM

## 2021-02-07 DIAGNOSIS — Z6837 Body mass index (BMI) 37.0-37.9, adult: Secondary | ICD-10-CM | POA: Diagnosis not present

## 2021-02-07 DIAGNOSIS — E669 Obesity, unspecified: Secondary | ICD-10-CM

## 2021-02-07 DIAGNOSIS — R7303 Prediabetes: Secondary | ICD-10-CM | POA: Diagnosis not present

## 2021-02-07 NOTE — Progress Notes (Signed)
Chief Complaint:   OBESITY Alyzea is here to discuss her progress with her obesity treatment plan along with follow-up of her obesity related diagnoses. Phebe is on the Category 2 Plan and states she is following her eating plan approximately 50% of the time. Kailena states she is doing 0 minutes 0 times per week.  Today's visit was #: 59 Starting weight: 242 lbs Starting date: 07/09/2018 Today's weight: 216 lbs Today's date: 02/07/2021 Total lbs lost to date: 26 Total lbs lost since last in-office visit: 0  Interim History: Aremy continues to feel weak and have leg pain after having COVID. She has not felt like cooking. She is eating out quite a bit. She has been eating what she feels like on those occasions.  Subjective:   1. Pre-diabetes Alliene is on metformin BID, and she denies polyphagia.  2. Vitamin D deficiency Keaja is on Vit D daily with calcium.  3. Other hyperlipidemia Donnesha is on fish oil, and she is not on prescription medications.   Assessment/Plan:   1. Pre-diabetes We will check labs today. Emmee will continue to work on weight loss, exercise, and decreasing simple carbohydrates to help decrease the risk of diabetes.   - Comprehensive metabolic panel - Hemoglobin A1c - Insulin, random  2. Vitamin D deficiency Low Vitamin D level contributes to fatigue and are associated with obesity, breast, and colon cancer. We will check labs today. Jenah will continue OTC Vitamin D and will follow-up for routine testing of Vitamin D, at least 2-3 times per year to avoid over-replacement.  - VITAMIN D 25 Hydroxy (Vit-D Deficiency, Fractures)  3. Other hyperlipidemia Cardiovascular risk and specific lipid/LDL goals reviewed. We discussed several lifestyle modifications today. We will check labs today. Emme will continue her fish oil, and will continue to work on diet, exercise and weight loss efforts. Orders and follow up as documented in patient record.    Counseling Intensive lifestyle modifications are the first line treatment for this issue. Dietary changes: Increase soluble fiber. Decrease simple carbohydrates. Exercise changes: Moderate to vigorous-intensity aerobic activity 150 minutes per week if tolerated. Lipid-lowering medications: see documented in medical record.  - Lipid panel  4. Obesity with current BMI of 37.06 Kapri is currently in the action stage of change. As such, her goal is to continue with weight loss efforts. She has agreed to the Category 2 Plan.   Exercise goals: No exercise has been prescribed at this time.  Behavioral modification strategies: increasing lean protein intake and decreasing simple carbohydrates.  Deresha has agreed to follow-up with our clinic in 3 weeks. She was informed of the importance of frequent follow-up visits to maximize her success with intensive lifestyle modifications for her multiple health conditions.   Lakicia was informed we would discuss her lab results at her next visit unless there is a critical issue that needs to be addressed sooner. Nayra agreed to keep her next visit at the agreed upon time to discuss these results.  Objective:   Blood pressure (!) 141/71, pulse 68, temperature (!) 97.5 F (36.4 C), height 5\' 4"  (1.626 m), weight 216 lb (98 kg), SpO2 98 %. Body mass index is 37.08 kg/m.  General: Cooperative, alert, well developed, in no acute distress. HEENT: Conjunctivae and lids unremarkable. Cardiovascular: Regular rhythm.  Lungs: Normal work of breathing. Neurologic: No focal deficits.   Lab Results  Component Value Date   CREATININE 0.92 06/29/2020   BUN 16 06/29/2020   NA 135 06/29/2020  K 4.2 06/29/2020   CL 94 (L) 06/29/2020   CO2 27 06/29/2020   Lab Results  Component Value Date   ALT 29 06/29/2020   AST 28 06/29/2020   ALKPHOS 49 06/29/2020   BILITOT 0.3 06/29/2020   Lab Results  Component Value Date   HGBA1C 5.6 06/29/2020   HGBA1C  5.4 10/19/2019   HGBA1C 5.3 05/27/2019   HGBA1C 5.4 11/10/2018   HGBA1C 5.8 (H) 07/09/2018   Lab Results  Component Value Date   INSULIN 17.7 06/29/2020   INSULIN 22.6 10/19/2019   INSULIN 16.2 05/27/2019   INSULIN 33.0 (H) 11/10/2018   INSULIN 25.0 (H) 07/09/2018   Lab Results  Component Value Date   TSH 2.210 07/09/2018   Lab Results  Component Value Date   CHOL 141 05/27/2019   HDL 55 05/27/2019   LDLCALC 69 05/27/2019   TRIG 88 05/27/2019   Lab Results  Component Value Date   VD25OH 44.7 06/29/2020   VD25OH 51.9 10/19/2019   VD25OH 90.2 11/10/2018   Lab Results  Component Value Date   WBC 6.0 07/09/2018   HGB 13.6 07/09/2018   HCT 39.7 07/09/2018   MCV 89 07/09/2018   PLT 241 07/09/2018   No results found for: IRON, TIBC, FERRITIN  Obesity Behavioral Intervention:   Approximately 15 minutes were spent on the discussion below.  ASK: We discussed the diagnosis of obesity with Linnet today and Alaiah agreed to give [name] permission to discuss obesity behavioral modification therapy today.  ASSESS: Harryette has the diagnosis of obesity and her BMI today is 37.1. Arteria is in the action stage of change.   ADVISE: Chantavia was educated on the multiple health risks of obesity as well as the benefit of weight loss to improve her health. She was advised of the need for long term treatment and the importance of lifestyle modifications to improve her current health and to decrease her risk of future health problems.  AGREE: Multiple dietary modification options and treatment options were discussed and Kitzie agreed to follow the recommendations documented in the above note.  ARRANGE: Laprincess was educated on the importance of frequent visits to treat obesity as outlined per CMS and USPSTF guidelines and agreed to schedule her next follow up appointment today.  Attestation Statements:   Reviewed by clinician on day of visit: allergies, medications, problem list,  medical history, surgical history, family history, social history, and previous encounter notes.   Wilhemena Durie, am acting as transcriptionist for [employer], PA-C.  I have reviewed the above documentation for accuracy and completeness, and I agree with the above. Abby Potash, PA-C

## 2021-02-08 LAB — LIPID PANEL
Chol/HDL Ratio: 3.3 ratio (ref 0.0–4.4)
Cholesterol, Total: 170 mg/dL (ref 100–199)
HDL: 51 mg/dL (ref >39)
LDL Chol Calc (NIH): 105 mg/dL — ABNORMAL HIGH (ref 0–99)
Triglycerides: 75 mg/dL (ref 0–149)
VLDL Cholesterol Cal: 14 mg/dL (ref 5–40)

## 2021-02-08 LAB — COMPREHENSIVE METABOLIC PANEL
ALT: 22 IU/L (ref 0–32)
AST: 22 IU/L (ref 0–40)
Albumin/Globulin Ratio: 1.7 (ref 1.2–2.2)
Albumin: 4.3 g/dL (ref 3.7–4.7)
Alkaline Phosphatase: 45 IU/L (ref 44–121)
BUN/Creatinine Ratio: 18 (ref 12–28)
BUN: 19 mg/dL (ref 8–27)
Bilirubin Total: 0.3 mg/dL (ref 0.0–1.2)
CO2: 25 mmol/L (ref 20–29)
Calcium: 9.8 mg/dL (ref 8.7–10.3)
Chloride: 91 mmol/L — ABNORMAL LOW (ref 96–106)
Creatinine, Ser: 1.03 mg/dL — ABNORMAL HIGH (ref 0.57–1.00)
Globulin, Total: 2.5 g/dL (ref 1.5–4.5)
Glucose: 98 mg/dL (ref 70–99)
Potassium: 4.2 mmol/L (ref 3.5–5.2)
Sodium: 132 mmol/L — ABNORMAL LOW (ref 134–144)
Total Protein: 6.8 g/dL (ref 6.0–8.5)
eGFR: 57 mL/min/{1.73_m2} — ABNORMAL LOW (ref >59)

## 2021-02-08 LAB — HEMOGLOBIN A1C
Est. average glucose Bld gHb Est-mCnc: 117 mg/dL
Hgb A1c MFr Bld: 5.7 % — ABNORMAL HIGH (ref 4.8–5.6)

## 2021-02-08 LAB — INSULIN, RANDOM: INSULIN: 17.5 u[IU]/mL (ref 2.6–24.9)

## 2021-02-08 LAB — VITAMIN D 25 HYDROXY (VIT D DEFICIENCY, FRACTURES): Vit D, 25-Hydroxy: 55.9 ng/mL (ref 30.0–100.0)

## 2021-02-28 ENCOUNTER — Other Ambulatory Visit: Payer: Self-pay

## 2021-02-28 ENCOUNTER — Ambulatory Visit (INDEPENDENT_AMBULATORY_CARE_PROVIDER_SITE_OTHER): Payer: Medicare Other | Admitting: Family Medicine

## 2021-02-28 ENCOUNTER — Encounter (INDEPENDENT_AMBULATORY_CARE_PROVIDER_SITE_OTHER): Payer: Self-pay | Admitting: Family Medicine

## 2021-02-28 VITALS — BP 112/68 | HR 68 | Temp 97.5°F | Ht 64.0 in | Wt 216.0 lb

## 2021-02-28 DIAGNOSIS — E559 Vitamin D deficiency, unspecified: Secondary | ICD-10-CM | POA: Diagnosis not present

## 2021-02-28 DIAGNOSIS — E7849 Other hyperlipidemia: Secondary | ICD-10-CM

## 2021-02-28 DIAGNOSIS — I10 Essential (primary) hypertension: Secondary | ICD-10-CM

## 2021-02-28 DIAGNOSIS — Z6837 Body mass index (BMI) 37.0-37.9, adult: Secondary | ICD-10-CM | POA: Diagnosis not present

## 2021-02-28 DIAGNOSIS — R7303 Prediabetes: Secondary | ICD-10-CM

## 2021-02-28 DIAGNOSIS — E669 Obesity, unspecified: Secondary | ICD-10-CM

## 2021-03-01 NOTE — Progress Notes (Signed)
Chief Complaint:   OBESITY Crystal Jackson is here to discuss her progress with her obesity treatment plan along with follow-up of her obesity related diagnoses. Crystal Jackson is on the Category 2 Plan and states she is following her eating plan approximately 50% of the time. [name] states she is walking for 10 minutes 2-3 times per week.  Today's visit was #: 50 Starting weight: 242 lbs Starting date: 07/09/2018 Today's weight: 216 lbs Today's date: 02/28/2021 Total lbs lost to date: 26 Total lbs lost since last in-office visit: 0  Interim History: Day is struggling with motivation lately and eating off the plan more.  No concerns with meal plan. She is here to review labs drawn on 02/07/2021.  Subjective:   1. Other hyperlipidemia [name] is not on medications, elevated. I discussed labs with the patient today. The 10-year ASCVD risk score (Arnett DK, et al., 2019) is: 14.6%   Values used to calculate the score:     Age: 32 years     Sex: Female     Is Non-Hispanic African American: No     Diabetic: No     Tobacco smoker: No     Systolic Blood Pressure: [id number] mmHg     Is BP treated: Yes     HDL Cholesterol: 51 mg/dL     Total Cholesterol: 170 mg/dL  2. Pre-diabetes Crystal Jackson's fasting insulin improved slightly from prior. A1c slightly worse from 5.6 to 5.7. She is on metformin. I discussed labs with the patient today.  3. Vitamin D deficiency Crystal Jackson has a history prior to losing weight. She is not on supplementations except for multivitamins with some Vit D in it. (Unknown amount). I discussed labs with the patient today.    Assessment/Plan:   1. Other hyperlipidemia Worsening LDL. I told the patient to discussed with Cardiology and/or her PCP in regards to if she needs medications or not. She is to decrease sat and trans fats (no fried foods or butter). Follow her meal plan.  2. Pre-diabetes [name] will continue her prudent nutritional plan, weight loss, decrease simple  carbohydrates, and increase protein. We will recheck labs in 3 months.  3. Vitamin D deficiency Crystal Jackson's Vit D os at goal of 50-70. She will continue multivitamins, continue with weight loss. Last bone density was <2 years ago. She goes every 2 regularly.   4. Obesity with current BMI of 37.2 Crystal Jackson is currently in the action stage of change. As such, her goal is to continue with weight loss efforts. She has agreed to the Category 2 Plan.   Talk to husband about not tempting her and also prep healthy snacks and put unhealthy snacks out of view.   Exercise goals: Increase exercise/movement. Try to use gym membership.  Behavioral modification strategies: keeping healthy foods in the home, dealing with family or coworker sabotage, avoiding temptations, and planning for success.  [name] has agreed to follow-up with our clinic in 3 weeks. She was informed of the importance of frequent follow-up visits to maximize her success with intensive lifestyle modifications for her multiple health conditions.    Objective:   Blood pressure 112/68, pulse 68, temperature (!) 97.5 F (36.4 C), height 5\' 4"  (1.626 m), weight 216 lb (98 kg), SpO2 98 %. Body mass index is 37.08 kg/m.  General: Cooperative, alert, well developed, in no acute distress. HEENT: Conjunctivae and lids unremarkable. Cardiovascular: Regular rhythm.  Lungs: Normal work of breathing. Neurologic: No focal deficits.   Lab Results  Component Value  Date   CREATININE 1.03 (H) 02/07/2021   BUN 19 02/07/2021   NA 132 (L) 02/07/2021   K 4.2 02/07/2021   CL 91 (L) 02/07/2021   CO2 25 02/07/2021   Lab Results  Component Value Date   ALT 22 02/07/2021   AST 22 02/07/2021   ALKPHOS 45 02/07/2021   BILITOT 0.3 02/07/2021   Lab Results  Component Value Date   HGBA1C 5.7 (H) 02/07/2021   HGBA1C 5.6 06/29/2020   HGBA1C 5.4 10/19/2019   HGBA1C 5.3 05/27/2019   HGBA1C 5.4 11/10/2018   Lab Results  Component Value Date    INSULIN 17.5 02/07/2021   INSULIN 17.7 06/29/2020   INSULIN 22.6 10/19/2019   INSULIN 16.2 05/27/2019   INSULIN 33.0 (H) 11/10/2018   Lab Results  Component Value Date   TSH 2.210 07/09/2018   Lab Results  Component Value Date   CHOL 170 02/07/2021   HDL 51 02/07/2021   LDLCALC 105 (H) 02/07/2021   TRIG 75 02/07/2021   CHOLHDL 3.3 02/07/2021   Lab Results  Component Value Date   VD25OH 55.9 02/07/2021   VD25OH 44.7 06/29/2020   VD25OH 51.9 10/19/2019   Lab Results  Component Value Date   WBC 6.0 07/09/2018   HGB 13.6 07/09/2018   HCT 39.7 07/09/2018   MCV 89 07/09/2018   PLT 241 07/09/2018   No results found for: IRON, TIBC, FERRITIN  Obesity Behavioral Intervention:   Approximately 15 minutes were spent on the discussion below.  ASK: We discussed the diagnosis of obesity with Sakiyah today and Fion agreed to give [name] permission to discuss obesity behavioral modification therapy today.  ASSESS: [name] has the diagnosis of obesity and her BMI today is 37.2. [name] is in the action stage of change.   ADVISE: Crystal Jackson was educated on the multiple health risks of obesity as well as the benefit of weight loss to improve her health. She was advised of the need for long term treatment and the importance of lifestyle modifications to improve her current health and to decrease her risk of future health problems.  AGREE: Multiple dietary modification options and treatment options were discussed and Crystal Jackson agreed to follow the recommendations documented in the above note.  ARRANGE: Crystal Jackson was educated on the importance of frequent visits to treat obesity as outlined per CMS and USPSTF guidelines and agreed to schedule her next follow up appointment today.  Attestation Statements:   Reviewed by clinician on day of visit: allergies, medications, problem list, medical history, surgical history, family history, social history, and previous encounter notes.   Wilhemena Durie, am acting as transcriptionist for [employer], DO.  I have reviewed the above documentation for accuracy and completeness, and I agree with the above. Marjory Sneddon, D.O.  The [place] was signed into law in 2016 which includes the topic of electronic health records.  This provides immediate access to information in MyChart.  This includes consultation notes, operative notes, office notes, lab results and pathology reports.  If you have any questions about what you read please let us know at your next visit so we can discuss your concerns and take corrective action if need be.  We are right here with you.

## 2021-03-10 ENCOUNTER — Ambulatory Visit: Payer: Medicare Other | Admitting: Physician Assistant

## 2021-03-10 ENCOUNTER — Encounter: Payer: Self-pay | Admitting: Physician Assistant

## 2021-03-10 ENCOUNTER — Ambulatory Visit (INDEPENDENT_AMBULATORY_CARE_PROVIDER_SITE_OTHER): Payer: Medicare Other

## 2021-03-10 ENCOUNTER — Telehealth: Payer: Self-pay | Admitting: Orthopaedic Surgery

## 2021-03-10 ENCOUNTER — Other Ambulatory Visit: Payer: Self-pay

## 2021-03-10 VITALS — Ht 64.0 in | Wt 216.0 lb

## 2021-03-10 DIAGNOSIS — M545 Low back pain, unspecified: Secondary | ICD-10-CM

## 2021-03-10 DIAGNOSIS — G8929 Other chronic pain: Secondary | ICD-10-CM

## 2021-03-10 MED ORDER — METHYLPREDNISOLONE 4 MG PO TBPK: ORAL_TABLET | ORAL | 0 refills | Status: DC

## 2021-03-10 NOTE — Telephone Encounter (Signed)
I put her with mary anne. Thank you! ?

## 2021-03-10 NOTE — Progress Notes (Signed)
? ?Office Visit Note ?  ?[name]           ?Date of Birth: 24-Mar-1945           ?MRN: [id number] ?Visit Date: 03/10/2021 ?             ?Requested by: Michael Boston, MD ?[address] ?[place],  [place] [address] ?PCP: Michael Boston, MD ? ?Chief Complaint  ?Patient presents with  ? Lower Back - Pain  ? ? ? ? ?HPI: ?Crystal Jackson is a pleasant 76 year old woman who presents today with right-sided lower back pain times several days.  She denies any injuries.  She denies any paresthesias.  She denies any weakness.  She denies any loss of bowel or bladder control.  She has been treating this with over-the-counter medication which helps a little bit.  She has a previous history of similar pain a few years ago.  She did get epidural steroid injections from Dr. Ernestina Patches which did help her but she said it took more than 1 injection ? ?Assessment & Plan: ?Visit Diagnoses:  ?1. Acute right-sided low back pain, unspecified whether sciatica present   ? ? ?Plan: Lower back pain.  I compared her x-rays she does have slight increase in the anterior listhesis at L4-5 and an increase in the arthropathy at L5-S1 though I do not see any other acute changes.  She is neurologically intact.  We discussed trying a Medrol Dosepak versus going to therapy and or going back to Dr. Ernestina Patches.  She like to try the [place] first.  She understands to take this with food and discontinue it if she gets an upset stomach.  She will follow-up with Gordy Levan in a few weeks.  Will contact us if things get worse in the meantime ? ?Follow-Up Instructions: No follow-ups on file.  ? ?Ortho Exam ? ?Patient is alert, oriented, no adenopathy, well-dressed, normal affect, normal respiratory effort. ?Examination of her lumbar spine she has no painful deformity no crepitus to palpation.  Her strength in her lower extremities is 5 out of 5 and equal with dorsiflexion plantarflexion of her ankles extension flexion of her knees flexion of her hips she has negative straight  leg raise bilaterally.  No pain with manipulation of her hip her exam is a little bit limited because of well-known knee pain ? ?Imaging: ?XR Lumbar Spine 2-3 Views ? ?Result Date: 03/10/2021 ?Radiographs of her lumbar spine were obtained in 2 projections.  She does have some degenerative changes especially at L5-S1.  She also has an anterior listhesis at L4-5 which has increased slightly since previous x-rays in 2020 no acute bony injuries.  She does have some facet arthropathy also noted at the lower lumbar levels  ?No images are attached to the encounter. ? ?Labs: ?Lab Results  ?Component Value Date  ? HGBA1C 5.7 (H) 02/07/2021  ? HGBA1C 5.6 06/29/2020  ? HGBA1C 5.4 10/19/2019  ? REPTSTATUS 10/29/2015 FINAL 10/24/2015  ? CULT NO GROWTH 5 DAYS 10/24/2015  ? ? ? ?Lab Results  ?Component Value Date  ? ALBUMIN 4.3 02/07/2021  ? ALBUMIN 4.5 06/29/2020  ? ALBUMIN 3.9 10/19/2019  ? ? ?No results found for: MG ?Lab Results  ?Component Value Date  ? VD25OH 55.9 02/07/2021  ? VD25OH 44.7 06/29/2020  ? VD25OH 51.9 10/19/2019  ? ? ?No results found for: PREALBUMIN ?CBC EXTENDED Latest Ref Rng & Units 07/09/2018 10/24/2015  ?WBC 3.4 - 10.8 x10E3/uL 6.0 5.6  ?RBC 3.77 - 5.28 x10E6/uL  4.48 4.17  ?HGB 11.1 - 15.9 g/dL 13.6 12.1  ?HCT 34.0 - 46.6 % 39.7 36.1  ?PLT 150 - 450 x10E3/uL 241 234  ?NEUTROABS 1.4 - 7.0 x10E3/uL 3.4 3.0  ?LYMPHSABS 0.7 - 3.1 x10E3/uL 2.0 2.1  ? ? ? ?Body mass index is 37.08 kg/m?. ? ?Orders:  ?Orders Placed This Encounter  ?Procedures  ? XR Lumbar Spine 2-3 Views  ? ?Meds ordered this encounter  ?Medications  ? methylPREDNISolone (MEDROL DOSEPAK) 4 MG TBPK tablet  ?  Sig: Take as directed  ?  Dispense:  21 tablet  ?  Refill:  0  ? ? ? Procedures: ?No procedures performed ? ?Clinical Data: ?No additional findings. ? ?ROS: ? ?All other systems negative, except as noted in the HPI. ?Review of Systems ? ?Objective: ?Vital Signs: Ht 5\' 4"  (1.626 m)   Wt 216 lb (98 kg)   BMI 37.08 kg/m?  ? ?Specialty Comments:   ?No specialty comments available. ? ?PMFS History: ?Patient Active Problem List  ? Diagnosis Date Noted  ? OSA (obstructive sleep apnea) 09/03/2018  ? Mild persistent asthma without complication 99991111  ? Severe obesity (BMI >= 40) ([facility]) 04/14/2018  ? Unilateral primary osteoarthritis, left knee 04/14/2018  ? Asthma in adult, mild intermittent, uncomplicated 0000000  ? Allergic rhinitis 09/05/2013  ? ?Past Medical History:  ?Diagnosis Date  ? Anxiety   ? Arthritis   ? Asthma   ? Back pain   ? Bilateral swelling of feet   ? Constipation   ? Depression   ? Gallbladder problem   ? GERD (gastroesophageal reflux disease)   ? History of low potassium   ? Hypertension   ? Joint pain   ? Obesity   ? Palpitations   ? Shortness of breath   ? Sleep apnea   ?  ?Family History  ?Problem Relation Age of Onset  ? Diabetes Mother   ? Heart disease Mother   ? Anxiety disorder Mother   ? Obesity Mother   ? Diabetes Father   ? Hypertension Father   ? Obesity Father   ?  ?Past Surgical History:  ?Procedure Laterality Date  ? ABDOMINAL HYSTERECTOMY    ? APPENDECTOMY    ? CHOLECYSTECTOMY    ? KNEE SURGERY Left   ? SHOULDER SURGERY Right   ? ?Social History  ? ?Occupational History  ? Not on file  ?Tobacco Use  ? Smoking status: Never  ? Smokeless tobacco: Never  ?Substance and Sexual Activity  ? Alcohol use: No  ?  Alcohol/week: 0.0 standard drinks  ? Drug use: No  ? Sexual activity: Not on file  ? ? ? ? ? ?

## 2021-03-10 NOTE — Telephone Encounter (Signed)
Pt really hurt her back and really wants to come in today. Magnus Ivan and Bronson Curb are not in building so she is wondering can someone else see her?  ? ?CB [phone] ?

## 2021-03-22 ENCOUNTER — Encounter (INDEPENDENT_AMBULATORY_CARE_PROVIDER_SITE_OTHER): Payer: Self-pay

## 2021-03-23 ENCOUNTER — Encounter: Payer: Self-pay | Admitting: Physician Assistant

## 2021-03-23 ENCOUNTER — Ambulatory Visit (INDEPENDENT_AMBULATORY_CARE_PROVIDER_SITE_OTHER): Payer: Medicare Other | Admitting: Physician Assistant

## 2021-03-23 ENCOUNTER — Ambulatory Visit (INDEPENDENT_AMBULATORY_CARE_PROVIDER_SITE_OTHER): Payer: Medicare Other

## 2021-03-23 ENCOUNTER — Telehealth: Payer: Self-pay

## 2021-03-23 ENCOUNTER — Ambulatory Visit: Payer: Medicare Other | Admitting: Physician Assistant

## 2021-03-23 DIAGNOSIS — M25562 Pain in left knee: Secondary | ICD-10-CM | POA: Diagnosis not present

## 2021-03-23 DIAGNOSIS — M1712 Unilateral primary osteoarthritis, left knee: Secondary | ICD-10-CM

## 2021-03-23 NOTE — Telephone Encounter (Signed)
Patient called stating that she had a fall last night and hurt her knee.  Would like to know if she could be worked in to see Bronson Curb? I did check the schedules, but didn't see anything available.  Cb# [phone].  Please advise.  Thank you.  ?

## 2021-03-23 NOTE — Telephone Encounter (Signed)
Spoke to the pt and worked her in ?

## 2021-03-23 NOTE — Progress Notes (Signed)
? ?Office Visit Note ?  ?[name]           ?Date of Birth: 1945-05-20           ?MRN: [id number] ?Visit Date: 03/23/2021 ?             ?Requested by: Melida Quitter, MD ?[address] ?[place],  [place] [address] ?PCP: Melida Quitter, MD ? ? ?Assessment & Plan: ?Visit Diagnoses:  ?1. Primary osteoarthritis of left knee   ? ? ?Plan: Recommend conservative measures such as ice elevation and limiting activities.  She can be weightbearing as tolerated on the left knee but no high-impact activities and would not recommend walking for exercise at this point time.  She has an appointment for different reason next week and we can reevaluate her left knee at that time.  Questions were encouraged and answered at length today. ? ?Follow-Up Instructions: Return in about 1 week (around 03/30/2021).  ? ?Orders:  ?Orders Placed This Encounter  ?Procedures  ? XR Knee 1-2 Views Left  ? ?No orders of the defined types were placed in this encounter. ? ? ? ? Procedures: ?No procedures performed ? ? ?Clinical Data: ?No additional findings. ? ? ?Subjective: ?Chief Complaint  ?Patient presents with  ? Left Knee - Pain  ? ? ?HPI ?Crystal Jackson is well-known to Dr. Eliberto Ivory service comes in today with left knee pain.  She reports that she was carrying her cat last night and tripped wearing flip-flops.  She landed on her knees mostly on the left knee.  She denies any loss of consciousness dizziness or any other injuries.  She is having pain mostly lateral aspect left knee and having some swelling. ?Review of Systems ?See HPI otherwise negative ? ?Objective: ?Vital Signs: There were no vitals taken for this visit. ? ?Physical Exam ?General well-developed well-nourished female no acute distress.  She ambulates with a slow antalgic gait.  She is using a cane to ambulate. ?Ortho Exam ?Left knee good range of motion.  Slight effusion.  No abnormal warmth erythema or ecchymosis.  Tenderness along the the lateral joint line and lateral distal  femur no tenderness over the medial joint line.  No gross instability valgus varus stressing. ? ?Specialty Comments:  ?No specialty comments available. ? ?Imaging: ?XR Knee 1-2 Views Left ? ?Result Date: 03/23/2021 ?Left knee 2 views: Fracture of a lateral periarticular osteophyte off the femur is seen compared to prior films.  Moderate medial lateral joint line narrowing.  Moderate to severe patellofemoral changes.  No other acute fractures or bony abnormalities.  ? ? ?PMFS History: ?Patient Active Problem List  ? Diagnosis Date Noted  ? Acute exacerbation of chronic low back pain 03/10/2021  ? OSA (obstructive sleep apnea) 09/03/2018  ? Mild persistent asthma without complication 09/03/2018  ? Severe obesity (BMI >= 40) ([facility]) 04/14/2018  ? Unilateral primary osteoarthritis, left knee 04/14/2018  ? Asthma in adult, mild intermittent, uncomplicated 02/26/2017  ? Allergic rhinitis 09/05/2013  ? ?Past Medical History:  ?Diagnosis Date  ? Anxiety   ? Arthritis   ? Asthma   ? Back pain   ? Bilateral swelling of feet   ? Constipation   ? Depression   ? Gallbladder problem   ? GERD (gastroesophageal reflux disease)   ? History of low potassium   ? Hypertension   ? Joint pain   ? Obesity   ? Palpitations   ? Shortness of breath   ? Sleep apnea   ?  ?  Family History  ?Problem Relation Age of Onset  ? Diabetes Mother   ? Heart disease Mother   ? Anxiety disorder Mother   ? Obesity Mother   ? Diabetes Father   ? Hypertension Father   ? Obesity Father   ?  ?Past Surgical History:  ?Procedure Laterality Date  ? ABDOMINAL HYSTERECTOMY    ? APPENDECTOMY    ? CHOLECYSTECTOMY    ? KNEE SURGERY Left   ? SHOULDER SURGERY Right   ? ?Social History  ? ?Occupational History  ? Not on file  ?Tobacco Use  ? Smoking status: Never  ? Smokeless tobacco: Never  ?Substance and Sexual Activity  ? Alcohol use: No  ?  Alcohol/week: 0.0 standard drinks  ? Drug use: No  ? Sexual activity: Not on file  ? ? ? ? ? ? ?

## 2021-03-23 NOTE — Telephone Encounter (Signed)
Will workin today @2  ?

## 2021-03-23 NOTE — Telephone Encounter (Signed)
Ok to work in.

## 2021-03-27 ENCOUNTER — Ambulatory Visit: Payer: Medicare Other | Admitting: Orthopaedic Surgery

## 2021-03-28 ENCOUNTER — Ambulatory Visit (INDEPENDENT_AMBULATORY_CARE_PROVIDER_SITE_OTHER): Payer: Medicare Other | Admitting: Physician Assistant

## 2021-03-28 ENCOUNTER — Other Ambulatory Visit: Payer: Self-pay

## 2021-03-28 ENCOUNTER — Encounter (INDEPENDENT_AMBULATORY_CARE_PROVIDER_SITE_OTHER): Payer: Self-pay | Admitting: Physician Assistant

## 2021-03-28 ENCOUNTER — Other Ambulatory Visit (INDEPENDENT_AMBULATORY_CARE_PROVIDER_SITE_OTHER): Payer: Self-pay | Admitting: Physician Assistant

## 2021-03-28 VITALS — BP 123/75 | HR 77 | Temp 97.7°F | Ht 64.0 in | Wt 214.0 lb

## 2021-03-28 DIAGNOSIS — R7303 Prediabetes: Secondary | ICD-10-CM

## 2021-03-28 DIAGNOSIS — Z6837 Body mass index (BMI) 37.0-37.9, adult: Secondary | ICD-10-CM

## 2021-03-28 DIAGNOSIS — E669 Obesity, unspecified: Secondary | ICD-10-CM | POA: Diagnosis not present

## 2021-03-28 DIAGNOSIS — I10 Essential (primary) hypertension: Secondary | ICD-10-CM

## 2021-03-28 MED ORDER — METFORMIN HCL 500 MG PO TABS: 500.0000 mg | ORAL_TABLET | Freq: Two times a day (BID) | ORAL | 0 refills | Status: DC

## 2021-03-28 MED ORDER — CHLORTHALIDONE 25 MG PO TABS: ORAL_TABLET | ORAL | 0 refills | Status: DC

## 2021-03-28 NOTE — Progress Notes (Signed)
? ? ? ?Chief Complaint:  ? ?OBESITY ?Emerald is here to discuss her progress with her obesity treatment plan along with follow-up of her obesity related diagnoses. Lakshmi is on the Category 2 Plan and states she is following her eating plan approximately 50% of the time. Yamely states she is walking ( fell and hurt her knee) for 10 minutes 2 times per week. ? ?Today's visit was #: 38 ?Starting weight: 242 lbs ?Starting date: 07/09/2018 ?Today's weight: 214 lbs ?Today's date: 03/28/2021 ?Total lbs lost to date: 28 lbs ?Total lbs lost since last in-office visit: 2 ? ?Interim History: [employer]'s oven died 65 weeks age and she has been using her microwave and stove to cook. She is eating out more often and trying to make good choices. Her orthopedist has advised her not to walk since she fell and broke a bone spur off of her knee.  ? ?Subjective:  ? ?1. Essential hypertension ?Elwanda is currently on chlorthalidone daily. Her blood pressure is controlled.  ? ?2. Pre-diabetes ?Bethel is on Metformin twice daily.  ? ?Assessment/Plan:  ? ?1. Essential hypertension ?We will refill chlorthalidone 25 mg for 1 month with no refills. Jaleese is working on healthy weight loss and exercise to improve blood pressure control. We will watch for signs of hypotension as she continues her lifestyle modifications. ? ?- chlorthalidone (HYGROTON) 25 MG tablet; Take 1/2 tab daily  Dispense: 45 tablet; Refill: 0 ? ?2. Pre-diabetes ?We will refill Metformin 500 mg for 1 month with no refills. Daylen will continue to work on weight loss, exercise, and decreasing simple carbohydrates to help decrease the risk of diabetes.  ? ?- metFORMIN (GLUCOPHAGE) 500 MG tablet; Take 1 tablet (500 mg total) by mouth 2 (two) times daily with a meal. (with lunch and dinner)  Dispense: 60 tablet; Refill: 0 ? ?3. Obesity with current BMI of 37.2 ?Elma is currently in the action stage of change. As such, her goal is to continue with weight loss efforts. She  has agreed to the Category 2 Plan.  ? ?Exercise goals:  As is.  ? ?Behavioral modification strategies: decreasing eating out. ? ?Nanny has agreed to follow-up with our clinic in 3 weeks. She was informed of the importance of frequent follow-up visits to maximize her success with intensive lifestyle modifications for her multiple health conditions.  ? ?Objective:  ? ?Blood pressure 123/75, pulse 77, temperature 97.7 ?F (36.5 ?C), height 5\' 4"  (1.626 m), weight 214 lb (97.1 kg), SpO2 94 %. ?Body mass index is 36.73 kg/m?. ? ?General: Cooperative, alert, well developed, in no acute distress. ?HEENT: Conjunctivae and lids unremarkable. ?Cardiovascular: Regular rhythm.  ?Lungs: Normal work of breathing. ?Neurologic: No focal deficits.  ? ?Lab Results  ?Component Value Date  ? CREATININE 1.03 (H) 02/07/2021  ? BUN 19 02/07/2021  ? NA 132 (L) 02/07/2021  ? K 4.2 02/07/2021  ? CL 91 (L) 02/07/2021  ? CO2 25 02/07/2021  ? ?Lab Results  ?Component Value Date  ? ALT 22 02/07/2021  ? AST 22 02/07/2021  ? ALKPHOS 45 02/07/2021  ? BILITOT 0.3 02/07/2021  ? ?Lab Results  ?Component Value Date  ? HGBA1C 5.7 (H) 02/07/2021  ? HGBA1C 5.6 06/29/2020  ? HGBA1C 5.4 10/19/2019  ? HGBA1C 5.3 05/27/2019  ? HGBA1C 5.4 11/10/2018  ? ?Lab Results  ?Component Value Date  ? INSULIN 17.5 02/07/2021  ? INSULIN 17.7 06/29/2020  ? INSULIN 22.6 10/19/2019  ? INSULIN 16.2 05/27/2019  ? INSULIN 33.0 (H) 11/10/2018  ? ?  Lab Results  ?Component Value Date  ? TSH 2.210 07/09/2018  ? ?Lab Results  ?Component Value Date  ? CHOL 170 02/07/2021  ? HDL 51 02/07/2021  ? LDLCALC 105 (H) 02/07/2021  ? TRIG 75 02/07/2021  ? CHOLHDL 3.3 02/07/2021  ? ?Lab Results  ?Component Value Date  ? VD25OH 55.9 02/07/2021  ? VD25OH 44.7 06/29/2020  ? VD25OH 51.9 10/19/2019  ? ?Lab Results  ?Component Value Date  ? WBC 6.0 07/09/2018  ? HGB 13.6 07/09/2018  ? HCT 39.7 07/09/2018  ? MCV 89 07/09/2018  ? PLT 241 07/09/2018  ? ?No results found for: IRON, TIBC,  FERRITIN ? ?Obesity Behavioral Intervention:  ? ?Approximately 15 minutes were spent on the discussion below. ? ?ASK: ?We discussed the diagnosis of obesity with Leyona today and Fatmeh agreed to give [name] permission to discuss obesity behavioral modification therapy today. ? ?ASSESS: ?Asiah has the diagnosis of obesity and her BMI today is 36.9. Brannon is in the action stage of change.  ? ?ADVISE: ?Lendia was educated on the multiple health risks of obesity as well as the benefit of weight loss to improve her health. She was advised of the need for long term treatment and the importance of lifestyle modifications to improve her current health and to decrease her risk of future health problems. ? ?AGREE: ?Multiple dietary modification options and treatment options were discussed and Evolett agreed to follow the recommendations documented in the above note. ? ?ARRANGE: ?Anahla was educated on the importance of frequent visits to treat obesity as outlined per CMS and USPSTF guidelines and agreed to schedule her next follow up appointment today. ? ?Attestation Statements:  ? ?Reviewed by clinician on day of visit: allergies, medications, problem list, medical history, surgical history, family history, social history, and previous encounter notes. ? ? ?I, Tonye Pearson, am acting as Location manager for [employer], PA-C. ? ?I have reviewed the above documentation for accuracy and completeness, and I agree with the above. Abby Potash, PA-C ? ?

## 2021-03-30 ENCOUNTER — Encounter: Payer: Self-pay | Admitting: Physician Assistant

## 2021-03-30 ENCOUNTER — Ambulatory Visit: Payer: Medicare Other | Admitting: Physician Assistant

## 2021-03-30 DIAGNOSIS — M1712 Unilateral primary osteoarthritis, left knee: Secondary | ICD-10-CM

## 2021-03-30 NOTE — Progress Notes (Signed)
? ?  Procedure Note ? ?[name]             ?Date of Birth: 03/07/1945           ?MRN: [id number]             ?Visit Date: 03/30/2021 ?HPI: Mrs. Klimaszewski comes in today requesting an aspiration for left knee.  She states that the knee just feels like it is more swollen at this point time.  She does note that knee overall feels a little better than it did a week ago.  Again she did have a fall when she was carrying her cat and tripped wearing flip-flops.  She been using Voltaren gel on the knee. ? ?Physical exam: Left knee full extension and flexion to approximately 105 degrees.  Positive effusion no abnormal warmth erythema. ? ? ?Procedures: ?Visit Diagnoses:  ?1. Primary osteoarthritis of left knee   ? ?Left knee was prepped with Betadine and ethyl chloride.  Then 3 cc of lidocaine was used to further anesthetize the knee.  Total of 50 cc of blood-tinged yellow aspirate obtained patient tolerates well. ? ?Plan: She will follow-up with [name] as needed.  She will continue use of Voltaren gel on the knee.  Continue work on range of motion strengthening the knee.  Ace bandage was applied she will remove this before retiring to bed this evening. ? ? ? ? ?

## 2021-04-18 ENCOUNTER — Ambulatory Visit (INDEPENDENT_AMBULATORY_CARE_PROVIDER_SITE_OTHER): Payer: Medicare Other | Admitting: Physician Assistant

## 2021-05-02 ENCOUNTER — Other Ambulatory Visit (INDEPENDENT_AMBULATORY_CARE_PROVIDER_SITE_OTHER): Payer: Self-pay | Admitting: Family Medicine

## 2021-05-02 ENCOUNTER — Other Ambulatory Visit (INDEPENDENT_AMBULATORY_CARE_PROVIDER_SITE_OTHER): Payer: Self-pay | Admitting: Physician Assistant

## 2021-05-02 DIAGNOSIS — R7303 Prediabetes: Secondary | ICD-10-CM

## 2021-05-12 ENCOUNTER — Other Ambulatory Visit: Payer: Self-pay | Admitting: Registered Nurse

## 2021-05-12 ENCOUNTER — Ambulatory Visit (HOSPITAL_BASED_OUTPATIENT_CLINIC_OR_DEPARTMENT_OTHER)
Admission: RE | Admit: 2021-05-12 | Discharge: 2021-05-12 | Disposition: A | Discharge status: Home / Self Care | Payer: Medicare Other | Source: Ambulatory Visit | Attending: Registered Nurse | Admitting: Registered Nurse

## 2021-05-12 ENCOUNTER — Other Ambulatory Visit (HOSPITAL_BASED_OUTPATIENT_CLINIC_OR_DEPARTMENT_OTHER): Payer: Self-pay | Admitting: Registered Nurse

## 2021-05-12 ENCOUNTER — Encounter (HOSPITAL_BASED_OUTPATIENT_CLINIC_OR_DEPARTMENT_OTHER): Payer: Self-pay

## 2021-05-12 DIAGNOSIS — R102 Pelvic and perineal pain: Secondary | ICD-10-CM | POA: Insufficient documentation

## 2021-05-12 DIAGNOSIS — R1032 Left lower quadrant pain: Secondary | ICD-10-CM | POA: Insufficient documentation

## 2021-05-12 DIAGNOSIS — A4151 Sepsis due to Escherichia coli [E. coli]: Secondary | ICD-10-CM | POA: Diagnosis not present

## 2021-05-12 DIAGNOSIS — R6883 Chills (without fever): Secondary | ICD-10-CM

## 2021-05-12 DIAGNOSIS — N390 Urinary tract infection, site not specified: Secondary | ICD-10-CM | POA: Diagnosis not present

## 2021-05-12 MED ORDER — IOHEXOL 300 MG/ML  SOLN
100.0000 mL | Freq: Once | INTRAMUSCULAR | Status: AC | PRN
  Administered 2021-05-12: 100 mL via INTRAVENOUS

## 2021-05-14 ENCOUNTER — Emergency Department (HOSPITAL_BASED_OUTPATIENT_CLINIC_OR_DEPARTMENT_OTHER): Payer: Medicare Other

## 2021-05-14 ENCOUNTER — Other Ambulatory Visit: Payer: Self-pay

## 2021-05-14 ENCOUNTER — Inpatient Hospital Stay (HOSPITAL_BASED_OUTPATIENT_CLINIC_OR_DEPARTMENT_OTHER)
Admission: EM | Admit: 2021-05-14 | Discharge: 2021-05-16 | DRG: 872 | Disposition: A | Discharge status: Home / Self Care | Payer: Medicare Other | Attending: Internal Medicine | Admitting: Internal Medicine

## 2021-05-14 ENCOUNTER — Encounter (HOSPITAL_BASED_OUTPATIENT_CLINIC_OR_DEPARTMENT_OTHER): Payer: Self-pay | Admitting: Obstetrics and Gynecology

## 2021-05-14 DIAGNOSIS — Z7984 Long term (current) use of oral hypoglycemic drugs: Secondary | ICD-10-CM | POA: Diagnosis not present

## 2021-05-14 DIAGNOSIS — J4530 Mild persistent asthma, uncomplicated: Secondary | ICD-10-CM | POA: Diagnosis present

## 2021-05-14 DIAGNOSIS — F32A Depression, unspecified: Secondary | ICD-10-CM | POA: Diagnosis present

## 2021-05-14 DIAGNOSIS — A4151 Sepsis due to Escherichia coli [E. coli]: Principal | ICD-10-CM | POA: Diagnosis present

## 2021-05-14 DIAGNOSIS — Z9989 Dependence on other enabling machines and devices: Secondary | ICD-10-CM | POA: Diagnosis not present

## 2021-05-14 DIAGNOSIS — Z833 Family history of diabetes mellitus: Secondary | ICD-10-CM

## 2021-05-14 DIAGNOSIS — Z20822 Contact with and (suspected) exposure to covid-19: Secondary | ICD-10-CM | POA: Diagnosis present

## 2021-05-14 DIAGNOSIS — F419 Anxiety disorder, unspecified: Secondary | ICD-10-CM | POA: Diagnosis present

## 2021-05-14 DIAGNOSIS — Z79899 Other long term (current) drug therapy: Secondary | ICD-10-CM

## 2021-05-14 DIAGNOSIS — E119 Type 2 diabetes mellitus without complications: Secondary | ICD-10-CM | POA: Diagnosis present

## 2021-05-14 DIAGNOSIS — K219 Gastro-esophageal reflux disease without esophagitis: Secondary | ICD-10-CM | POA: Diagnosis present

## 2021-05-14 DIAGNOSIS — E871 Hypo-osmolality and hyponatremia: Secondary | ICD-10-CM | POA: Diagnosis present

## 2021-05-14 DIAGNOSIS — M1990 Unspecified osteoarthritis, unspecified site: Secondary | ICD-10-CM | POA: Diagnosis present

## 2021-05-14 DIAGNOSIS — J9601 Acute respiratory failure with hypoxia: Secondary | ICD-10-CM | POA: Diagnosis present

## 2021-05-14 DIAGNOSIS — Z6836 Body mass index (BMI) 36.0-36.9, adult: Secondary | ICD-10-CM | POA: Diagnosis not present

## 2021-05-14 DIAGNOSIS — R6520 Severe sepsis without septic shock: Secondary | ICD-10-CM | POA: Diagnosis present

## 2021-05-14 DIAGNOSIS — A419 Sepsis, unspecified organism: Secondary | ICD-10-CM | POA: Diagnosis not present

## 2021-05-14 DIAGNOSIS — N12 Tubulo-interstitial nephritis, not specified as acute or chronic: Secondary | ICD-10-CM | POA: Diagnosis present

## 2021-05-14 DIAGNOSIS — G4733 Obstructive sleep apnea (adult) (pediatric): Secondary | ICD-10-CM | POA: Diagnosis present

## 2021-05-14 DIAGNOSIS — Z794 Long term (current) use of insulin: Secondary | ICD-10-CM

## 2021-05-14 DIAGNOSIS — R6510 Systemic inflammatory response syndrome (SIRS) of non-infectious origin without acute organ dysfunction: Secondary | ICD-10-CM

## 2021-05-14 DIAGNOSIS — N3000 Acute cystitis without hematuria: Secondary | ICD-10-CM | POA: Diagnosis not present

## 2021-05-14 DIAGNOSIS — N390 Urinary tract infection, site not specified: Secondary | ICD-10-CM | POA: Diagnosis present

## 2021-05-14 DIAGNOSIS — J449 Chronic obstructive pulmonary disease, unspecified: Secondary | ICD-10-CM | POA: Diagnosis present

## 2021-05-14 DIAGNOSIS — I10 Essential (primary) hypertension: Secondary | ICD-10-CM | POA: Diagnosis present

## 2021-05-14 DIAGNOSIS — Z888 Allergy status to other drugs, medicaments and biological substances status: Secondary | ICD-10-CM

## 2021-05-14 DIAGNOSIS — Z87440 Personal history of urinary (tract) infections: Secondary | ICD-10-CM | POA: Diagnosis not present

## 2021-05-14 DIAGNOSIS — Z880 Allergy status to penicillin: Secondary | ICD-10-CM

## 2021-05-14 DIAGNOSIS — Z90710 Acquired absence of both cervix and uterus: Secondary | ICD-10-CM | POA: Diagnosis not present

## 2021-05-14 DIAGNOSIS — R0902 Hypoxemia: Secondary | ICD-10-CM | POA: Diagnosis present

## 2021-05-14 DIAGNOSIS — Z885 Allergy status to narcotic agent status: Secondary | ICD-10-CM

## 2021-05-14 DIAGNOSIS — E669 Obesity, unspecified: Secondary | ICD-10-CM | POA: Diagnosis present

## 2021-05-14 DIAGNOSIS — Z8249 Family history of ischemic heart disease and other diseases of the circulatory system: Secondary | ICD-10-CM

## 2021-05-14 DIAGNOSIS — Z818 Family history of other mental and behavioral disorders: Secondary | ICD-10-CM

## 2021-05-14 LAB — URINALYSIS, ROUTINE W REFLEX MICROSCOPIC
Bilirubin Urine: NEGATIVE
Glucose, UA: NEGATIVE mg/dL
Hgb urine dipstick: NEGATIVE
Ketones, ur: NEGATIVE mg/dL
Nitrite: NEGATIVE
Protein, ur: 30 mg/dL — AB
Specific Gravity, Urine: 1.015 (ref 1.005–1.030)
WBC, UA: >50 WBC/hpf — ABNORMAL HIGH (ref 0–5)
pH: 7.5 (ref 5.0–8.0)

## 2021-05-14 LAB — CBC
HCT: 38.8 % (ref 36.0–46.0)
Hemoglobin: 13.5 g/dL (ref 12.0–15.0)
MCH: 30.1 pg (ref 26.0–34.0)
MCHC: 34.8 g/dL (ref 30.0–36.0)
MCV: 86.6 fL (ref 80.0–100.0)
Platelets: 162 10*3/uL (ref 150–400)
RBC: 4.48 MIL/uL (ref 3.87–5.11)
RDW: 13 % (ref 11.5–15.5)
WBC: 4.7 10*3/uL (ref 4.0–10.5)
nRBC: 0 % (ref 0.0–0.2)

## 2021-05-14 LAB — LACTIC ACID, PLASMA
Lactic Acid, Venous: 3.1 mmol/L (ref 0.5–1.9)
Lactic Acid, Venous: 2.8 mmol/L (ref 0.5–1.9)

## 2021-05-14 LAB — COMPREHENSIVE METABOLIC PANEL
ALT: 20 U/L (ref 0–44)
AST: 22 U/L (ref 15–41)
Albumin: 4.5 g/dL (ref 3.5–5.0)
Alkaline Phosphatase: 36 U/L — ABNORMAL LOW (ref 38–126)
Anion gap: 13 (ref 5–15)
BUN: 18 mg/dL (ref 8–23)
CO2: 27 mmol/L (ref 22–32)
Calcium: 9.7 mg/dL (ref 8.9–10.3)
Chloride: 89 mmol/L — ABNORMAL LOW (ref 98–111)
Creatinine, Ser: 0.96 mg/dL (ref 0.44–1.00)
GFR, Estimated: >60 mL/min (ref >60)
Glucose, Bld: 106 mg/dL — ABNORMAL HIGH (ref 70–99)
Potassium: 3.5 mmol/L (ref 3.5–5.1)
Sodium: 129 mmol/L — ABNORMAL LOW (ref 135–145)
Total Bilirubin: 0.7 mg/dL (ref 0.3–1.2)
Total Protein: 7.3 g/dL (ref 6.5–8.1)

## 2021-05-14 LAB — LIPASE, BLOOD: Lipase: 30 U/L (ref 11–51)

## 2021-05-14 MED ORDER — SODIUM CHLORIDE 0.9 % IV SOLN
2.0000 g | Freq: Once | INTRAVENOUS | Status: AC
  Administered 2021-05-14: 2 g via INTRAVENOUS
  Filled 2021-05-14: qty 20

## 2021-05-14 MED ORDER — ONDANSETRON HCL 4 MG/2ML IJ SOLN
4.0000 mg | Freq: Once | INTRAMUSCULAR | Status: AC | PRN
  Administered 2021-05-14: 4 mg via INTRAVENOUS
  Filled 2021-05-14: qty 2

## 2021-05-14 MED ORDER — HYDROMORPHONE HCL 1 MG/ML IJ SOLN
0.5000 mg | Freq: Once | INTRAMUSCULAR | Status: AC
  Administered 2021-05-14: 0.5 mg via INTRAVENOUS
  Filled 2021-05-14: qty 1

## 2021-05-14 MED ORDER — ONDANSETRON HCL 4 MG/2ML IJ SOLN
4.0000 mg | Freq: Once | INTRAMUSCULAR | Status: AC
  Administered 2021-05-14: 4 mg via INTRAVENOUS
  Filled 2021-05-14: qty 2

## 2021-05-14 MED ORDER — LACTATED RINGERS IV BOLUS
500.0000 mL | Freq: Once | INTRAVENOUS | Status: AC
  Administered 2021-05-14: 500 mL via INTRAVENOUS

## 2021-05-14 MED ORDER — LACTATED RINGERS IV SOLN: INTRAVENOUS | Status: DC

## 2021-05-14 MED ORDER — LACTATED RINGERS IV BOLUS (SEPSIS)
1000.0000 mL | Freq: Once | INTRAVENOUS | Status: AC
  Administered 2021-05-14: 1000 mL via INTRAVENOUS

## 2021-05-14 NOTE — ED Notes (Signed)
Pt unable to try to void at this time due to vomiting and weakness. Zofran adm. Bsc at the bedside . ?

## 2021-05-14 NOTE — ED Notes (Signed)
Unable to obtain 2nd blood culture, pt did not want another stick . ?

## 2021-05-14 NOTE — ED Notes (Signed)
Pt refused covid/flu/rsv swab. ? ?Per pt she tested negative at [facility] on Friday (05/12/21) // MD made aware and stated that we do not have to repeat swab today  ?

## 2021-05-14 NOTE — ED Notes (Signed)
Bladder scan 0cc

## 2021-05-14 NOTE — Progress Notes (Signed)
Pt being followed by [facility] for Sepsis protocol. 

## 2021-05-14 NOTE — Plan of Care (Signed)
[facility] will assume care on arrival to accepting facility. Until arrival, care as per EDP. However, [facility] available 24/7 for questions and assistance.  Nursing staff, please page [facility] Admits and Consults ([phone]) as soon as the patient arrives to the hospital.   

## 2021-05-14 NOTE — ED Notes (Signed)
Placed on room air per MD request // pt does not wear home oxygen but admits to wearing a CPAP at night ?

## 2021-05-14 NOTE — ED Notes (Signed)
CRITICAL VALUE STICKER ? ?CRITICAL VALUE:Lactic acid 3.1 ? ?RECEIVER (on-site recipient of call):Carmon Ginsberg, RN ? ?DATE & TIME NOTIFIED:  ? ?MESSENGER (representative from lab): ? ?MD NOTIFIED: Dr. Donnald Garre ? ?TIME OF NOTIFICATION:1821 ? ?RESPONSE:  ? ?

## 2021-05-14 NOTE — ED Notes (Signed)
Report given to [facility] 

## 2021-05-14 NOTE — ED Notes (Signed)
Per dayshift nurse pt refused 2nd set of blood cultures ?

## 2021-05-14 NOTE — ED Triage Notes (Signed)
Patient reports to the ER for UTI and vomiting and fever. Patient reports she went to [facility] and was told her urine was clear and she did not need antibiotics. Patient reports weakness, fatigue, and chills.  ?

## 2021-05-14 NOTE — ED Provider Notes (Signed)
?[facility] EM[facility]ENC[facility] DEPT ?Provider Note ? ? ?CSN: [id number] ?Arrival date & time: 05/14/21  1550 ? ?  ? ?History ? ?Chief Complaint  ?Patient presents with  ? Urinary Tract Infection  ? Fever  ? ? ?Crystal Jackson is a 76 y.o. female. ? ?HPI ?Patient reports that she started to get symptoms she thought was urinary tract infection about 5 days ago.  She reports she has been having pain in her back on both sides and suprapubic pressure.  She was seen at her PCP office [phone] with left lower abdominal pain and chills.  Urinalysis was negative at that time.  Patient was sent for CT abdomen pelvis to rule out diverticulitis.  No acute findings identified.  Patient was instructed for symptomatic care.  Patient reports that she was taking Pyridium to get some relief from the amount of pressure and back pain she was experiencing.  Symptoms did seem to ease up somewhat but since yesterday she has had a significant increase in chills, malaise and back pain.  Patient reports has had intermittent urinary tract infections.  She has not had frequent urinary tract infections nor has she had any recent hospitalization.  Patient denies she had productive cough, chest pain or shortness of breath.  No tick bites.  Patient reports she has not been spending much time outside recently.  No rashes. ?  ? ?Home Medications ?Prior to Admission medications   ?Medication Sig Start Date End Date Taking? Authorizing Provider  ?albuterol (VENTOLIN HFA) 108 (90 Base) MCG/ACT inhaler Inhale 1-2 puffs into the lungs every 6 (six) hours as needed for wheezing or shortness of breath. 10/28/20   Chilton Greathouse, MD  ?Ascorbic Acid (VITAMIN C) 100 MG tablet Take 100 mg by mouth daily.    [provider]  ?Calcium Carbonate-Vitamin D 600-400 MG-UNIT tablet 1 po qd 07/13/20   Thomasene Lot, DO  ?chlorpheniramine (CHLOR-TRIMETON) 4 MG tablet Take 1 tablet (4 mg total) by mouth every 6 (six) hours. 09/09/15   Chilton Greathouse, MD   ?chlorthalidone (HYGROTON) 25 MG tablet Take 1/2 tab daily 03/28/21   [name], PA-C  ?clotrimazole-betamethasone (LOTRISONE) cream Qd for rash 01/28/17   [provider]  ?dicyclomine (BENTYL) 20 MG tablet Take 20 mg by mouth 4 (four) times daily -  before meals and at bedtime. As needed    [provider]  ?doxazosin (CARDURA) 1 MG tablet Take 1 mg by mouth daily.    [provider]  ?Eszopiclone 3 MG TABS Take 2 mg by mouth at bedtime as needed. 03/23/20   [provider]  ?Fluticasone Furoate (ARNUITY ELLIPTA) 200 MCG/ACT AEPB Inhale 1 puff into the lungs daily. 10/28/20   Chilton Greathouse, MD  ?gabapentin (NEURONTIN) 100 MG capsule  08/26/17   [provider]  ?Insulin Pen Needle 32G X 4 MM MISC Use once daily 03/08/20   [name], PA-C  ?Magnesium 500 MG CAPS Take by mouth.    [provider]  ?metFORMIN (GLUCOPHAGE) 500 MG tablet Take 1 tablet (500 mg total) by mouth 2 (two) times daily with a meal. (with lunch and dinner) 03/28/21   [name], PA-C  ?methylPREDNISolone (MEDROL DOSEPAK) 4 MG TBPK tablet Take as directed 03/10/21   Persons, [place], PA  ?metoprolol tartrate (LOPRESSOR) 25 MG tablet Take 25-50 mg by mouth 2 (two) times daily. 50 mg in the am and 50 mg at night 01/27/15   [provider]  ?METRONIDAZOLE, TOPICAL, 0.75 % LOTN Apply topically.  [provider]  ?Multiple Vitamin (MULTIVITAMIN) tablet Take 1 tablet by mouth daily.     [provider]  ?Omega-3 Fatty Acids (FISH OIL) 500 MG CAPS Take by mouth.    [provider]  ?pantoprazole (PROTONIX) 40 MG tablet Take 40 mg by mouth 2 (two) times daily. 01/31/15   [provider]  ?potassium chloride (K-DUR) 10 MEQ tablet TAKE 2 TABLETS BY MOUTH TWICE DAILY 10/23/17   [provider]  ?SUPER B COMPLEX/C PO Take by mouth.    [provider]  ?traZODone (DESYREL) 150 MG tablet Take 150 mg by mouth at bedtime.     [provider]  ?triamcinolone cream (KENALOG) 0.1 % Apply topically. 12/04/19   [provider]  ?VALACYCLOVIR HCL PO Take 500 mg by mouth 2 (two) times daily. For 3 days prn    [provider]  ?venlafaxine XR (EFFEXOR-XR) 75 MG 24 hr capsule TK 3 CS PO D 07/12/17   [provider]  ?   ? ?Allergies    ?Avapro [irbesartan], Dextromethorphan, Guaifenesin & derivatives, Valtrex [valacyclovir], Citalopram, Codeine, Duloxetine, Penicillin g, and Vilazodone   ? ?Review of Systems   ?Review of Systems ?10 systems reviewed negative except as per HPI ?Physical Exam ?Updated Vital Signs ?BP (!) 169/76   Pulse 89   Temp 99.6 ?F (37.6 ?C)   Resp (!) 22   Ht 5\' 4"  (1.626 m)   Wt 96.6 kg   SpO2 95%   BMI 36.56 kg/m?  ?Physical Exam ?Constitutional:   ?   Comments: Alert but uncomfortable and moderately ill in appearance.  Mental status clear.  ?HENT:  ?   Nose: Nose normal.  ?   Mouth/Throat:  ?   Mouth: Mucous membranes are dry.  ?   Pharynx: Oropharynx is clear.  ?Eyes:  ?   Extraocular Movements: Extraocular movements intact.  ?   Conjunctiva/sclera: Conjunctivae normal.  ?Cardiovascular:  ?   Rate and Rhythm: Regular rhythm. Tachycardia present.  ?Pulmonary:  ?   Effort: Pulmonary effort is normal.  ?   Breath sounds: Normal breath sounds.  ?Abdominal:  ?   Comments: Abdomen is soft.  No guarding.  Mild lower abdominal discomfort to palpation.  No significant CVA tenderness.  ?Musculoskeletal:     ?   General: Normal range of motion.  ?   Cervical back: Neck supple.  ?   Right lower leg: No edema.  ?   Left lower leg: No edema.  ?Skin: ?   General: Skin is warm and dry.  ?Neurological:  ?   General: No focal deficit present.  ?   Mental Status: She is oriented to person, place, and time.  ?   Coordination: Coordination normal.  ? ? ?ED Results / Procedures / Treatments   ?Labs ?(all labs ordered are listed, but only abnormal results are displayed) ?Labs Reviewed  ?CBC  ?LIPASE,  BLOOD  ?COMPREHENSIVE METABOLIC PANEL  ?URINALYSIS, ROUTINE W REFLEX MICROSCOPIC  ? ? ?EKG ?None ? ?Radiology ?CT ABDOMEN PELVIS W CONTRAST ? ?Result Date: 05/12/2021 ?CLINICAL DATA:  Left lower quadrant and suprapubic pain. EXAM: CT ABDOMEN AND PELVIS WITH CONTRAST TECHNIQUE: Multidetector CT imaging of the abdomen and pelvis was performed using the standard protocol following bolus administration of intravenous contrast. RADIATION DOSE REDUCTION: This exam was performed according to the departmental dose-optimization program which includes automated exposure control, adjustment of the mA and/or kV according to patient size and/or use of iterative reconstruction technique. CONTRAST:  OMNIPAQUE IOHEXOL 300 MG/ML  SOLN COMPARISON:  None Available. FINDINGS: Lower chest: Unremarkable. Hepatobiliary: No suspicious focal abnormality within the liver parenchyma. Gallbladder is surgically absent. No intrahepatic or extrahepatic biliary dilation. Pancreas: No focal mass lesion. No dilatation of the main duct. No intraparenchymal cyst. No peripancreatic edema. Spleen: No splenomegaly. No focal mass lesion. Adrenals/Urinary Tract: No adrenal nodule or mass. Kidneys unremarkable. No evidence for hydroureter. The urinary bladder appears normal for the degree of distention. Stomach/Bowel: Stomach is distended with food and contrast material. Duodenum is normally positioned as is the ligament of Treitz. No small bowel wall thickening. No small bowel dilatation. The terminal ileum is normal. Nonvisualization of the appendix is consistent with the reported history of appendectomy. No gross colonic mass. No colonic wall thickening. Diverticular changes are noted in the left colon without evidence of diverticulitis. Vascular/Lymphatic: There is mild atherosclerotic calcification of the abdominal aorta without aneurysm. There is no gastrohepatic or hepatoduodenal ligament lymphadenopathy. No retroperitoneal or mesenteric  lymphadenopathy. No pelvic sidewall lymphadenopathy. Reproductive: Uterus surgically absent.  There is no adnexal mass. Other: No intraperitoneal free fluid. Musculoskeletal: No worrisome lytic or sclerotic osseous abnormality. Deg

## 2021-05-15 ENCOUNTER — Ambulatory Visit (INDEPENDENT_AMBULATORY_CARE_PROVIDER_SITE_OTHER): Payer: Medicare Other | Admitting: Physician Assistant

## 2021-05-15 DIAGNOSIS — Z9989 Dependence on other enabling machines and devices: Secondary | ICD-10-CM

## 2021-05-15 DIAGNOSIS — G4733 Obstructive sleep apnea (adult) (pediatric): Secondary | ICD-10-CM

## 2021-05-15 DIAGNOSIS — J9601 Acute respiratory failure with hypoxia: Secondary | ICD-10-CM | POA: Diagnosis not present

## 2021-05-15 DIAGNOSIS — R6520 Severe sepsis without septic shock: Secondary | ICD-10-CM

## 2021-05-15 DIAGNOSIS — N12 Tubulo-interstitial nephritis, not specified as acute or chronic: Secondary | ICD-10-CM | POA: Diagnosis not present

## 2021-05-15 DIAGNOSIS — A419 Sepsis, unspecified organism: Secondary | ICD-10-CM | POA: Diagnosis present

## 2021-05-15 DIAGNOSIS — R0902 Hypoxemia: Secondary | ICD-10-CM | POA: Diagnosis present

## 2021-05-15 DIAGNOSIS — N3000 Acute cystitis without hematuria: Secondary | ICD-10-CM

## 2021-05-15 DIAGNOSIS — I10 Essential (primary) hypertension: Secondary | ICD-10-CM | POA: Diagnosis present

## 2021-05-15 DIAGNOSIS — E119 Type 2 diabetes mellitus without complications: Secondary | ICD-10-CM | POA: Diagnosis not present

## 2021-05-15 DIAGNOSIS — J4530 Mild persistent asthma, uncomplicated: Secondary | ICD-10-CM

## 2021-05-15 LAB — BASIC METABOLIC PANEL
Anion gap: 8 (ref 5–15)
Anion gap: 8 (ref 5–15)
Anion gap: 8 (ref 5–15)
BUN: 18 mg/dL (ref 8–23)
BUN: 15 mg/dL (ref 8–23)
BUN: 13 mg/dL (ref 8–23)
CO2: 29 mmol/L (ref 22–32)
CO2: 30 mmol/L (ref 22–32)
CO2: 29 mmol/L (ref 22–32)
Calcium: 8.7 mg/dL — ABNORMAL LOW (ref 8.9–10.3)
Calcium: 8.8 mg/dL — ABNORMAL LOW (ref 8.9–10.3)
Calcium: 8.9 mg/dL (ref 8.9–10.3)
Chloride: 91 mmol/L — ABNORMAL LOW (ref 98–111)
Chloride: 91 mmol/L — ABNORMAL LOW (ref 98–111)
Chloride: 95 mmol/L — ABNORMAL LOW (ref 98–111)
Creatinine, Ser: 1.09 mg/dL — ABNORMAL HIGH (ref 0.44–1.00)
Creatinine, Ser: 0.98 mg/dL (ref 0.44–1.00)
Creatinine, Ser: 0.9 mg/dL (ref 0.44–1.00)
GFR, Estimated: 53 mL/min — ABNORMAL LOW (ref >60)
GFR, Estimated: >60 mL/min (ref >60)
GFR, Estimated: >60 mL/min (ref >60)
Glucose, Bld: 154 mg/dL — ABNORMAL HIGH (ref 70–99)
Glucose, Bld: 146 mg/dL — ABNORMAL HIGH (ref 70–99)
Glucose, Bld: 121 mg/dL — ABNORMAL HIGH (ref 70–99)
Potassium: 3.4 mmol/L — ABNORMAL LOW (ref 3.5–5.1)
Potassium: 3.8 mmol/L (ref 3.5–5.1)
Potassium: 3.6 mmol/L (ref 3.5–5.1)
Sodium: 128 mmol/L — ABNORMAL LOW (ref 135–145)
Sodium: 129 mmol/L — ABNORMAL LOW (ref 135–145)
Sodium: 132 mmol/L — ABNORMAL LOW (ref 135–145)

## 2021-05-15 LAB — CBC
HCT: 32.2 % — ABNORMAL LOW (ref 36.0–46.0)
Hemoglobin: 10.8 g/dL — ABNORMAL LOW (ref 12.0–15.0)
MCH: 29.9 pg (ref 26.0–34.0)
MCHC: 33.5 g/dL (ref 30.0–36.0)
MCV: 89.2 fL (ref 80.0–100.0)
Platelets: 139 10*3/uL — ABNORMAL LOW (ref 150–400)
RBC: 3.61 MIL/uL — ABNORMAL LOW (ref 3.87–5.11)
RDW: 13.1 % (ref 11.5–15.5)
WBC: 16.3 10*3/uL — ABNORMAL HIGH (ref 4.0–10.5)
nRBC: 0 % (ref 0.0–0.2)

## 2021-05-15 LAB — COMPREHENSIVE METABOLIC PANEL
ALT: 18 U/L (ref 0–44)
AST: 23 U/L (ref 15–41)
Albumin: 3.1 g/dL — ABNORMAL LOW (ref 3.5–5.0)
Alkaline Phosphatase: 26 U/L — ABNORMAL LOW (ref 38–126)
Anion gap: 7 (ref 5–15)
BUN: 17 mg/dL (ref 8–23)
CO2: 29 mmol/L (ref 22–32)
Calcium: 8.6 mg/dL — ABNORMAL LOW (ref 8.9–10.3)
Chloride: 92 mmol/L — ABNORMAL LOW (ref 98–111)
Creatinine, Ser: 0.95 mg/dL (ref 0.44–1.00)
GFR, Estimated: >60 mL/min (ref >60)
Glucose, Bld: 155 mg/dL — ABNORMAL HIGH (ref 70–99)
Potassium: 3.6 mmol/L (ref 3.5–5.1)
Sodium: 128 mmol/L — ABNORMAL LOW (ref 135–145)
Total Bilirubin: 0.6 mg/dL (ref 0.3–1.2)
Total Protein: 5.7 g/dL — ABNORMAL LOW (ref 6.5–8.1)

## 2021-05-15 LAB — HEPATIC FUNCTION PANEL
ALT: 20 U/L (ref 0–44)
AST: 24 U/L (ref 15–41)
Albumin: 3.2 g/dL — ABNORMAL LOW (ref 3.5–5.0)
Alkaline Phosphatase: 27 U/L — ABNORMAL LOW (ref 38–126)
Bilirubin, Direct: 0.1 mg/dL (ref 0.0–0.2)
Indirect Bilirubin: 0.4 mg/dL (ref 0.3–0.9)
Total Bilirubin: 0.5 mg/dL (ref 0.3–1.2)
Total Protein: 6.1 g/dL — ABNORMAL LOW (ref 6.5–8.1)

## 2021-05-15 LAB — RESP PANEL BY RT-PCR (FLU A&B, COVID) ARPGX2
Influenza A by PCR: NEGATIVE
Influenza B by PCR: NEGATIVE
SARS Coronavirus 2 by RT PCR: NEGATIVE

## 2021-05-15 LAB — APTT: aPTT: 29 seconds (ref 24–36)

## 2021-05-15 LAB — PROTIME-INR
INR: 1.2 (ref 0.8–1.2)
Prothrombin Time: 14.7 seconds (ref 11.4–15.2)

## 2021-05-15 LAB — TSH: TSH: 1.199 u[IU]/mL (ref 0.350–4.500)

## 2021-05-15 LAB — SODIUM, URINE, RANDOM: Sodium, Ur: 35 mmol/L

## 2021-05-15 LAB — CK: Total CK: 68 U/L (ref 38–234)

## 2021-05-15 LAB — MAGNESIUM
Magnesium: 1.4 mg/dL — ABNORMAL LOW (ref 1.7–2.4)
Magnesium: 1.8 mg/dL (ref 1.7–2.4)

## 2021-05-15 LAB — LACTIC ACID, PLASMA
Lactic Acid, Venous: 2.1 mmol/L (ref 0.5–1.9)
Lactic Acid, Venous: 2.2 mmol/L (ref 0.5–1.9)
Lactic Acid, Venous: 1.1 mmol/L (ref 0.5–1.9)

## 2021-05-15 LAB — PREALBUMIN: Prealbumin: 17.2 mg/dL — ABNORMAL LOW (ref 18–38)

## 2021-05-15 LAB — PHOSPHORUS: Phosphorus: 2.9 mg/dL (ref 2.5–4.6)

## 2021-05-15 LAB — OSMOLALITY: Osmolality: 270 mOsm/kg — ABNORMAL LOW (ref 275–295)

## 2021-05-15 LAB — OSMOLALITY, URINE: Osmolality, Ur: 423 mOsm/kg (ref 300–900)

## 2021-05-15 LAB — PROCALCITONIN: Procalcitonin: 11.68 ng/mL

## 2021-05-15 LAB — CREATININE, URINE, RANDOM: Creatinine, Urine: 116.15 mg/dL

## 2021-05-15 LAB — D-DIMER, QUANTITATIVE: D-Dimer, Quant: 0.7 ug/mL-FEU — ABNORMAL HIGH (ref 0.00–0.50)

## 2021-05-15 LAB — CORTISOL: Cortisol, Plasma: 21.5 ug/dL

## 2021-05-15 MED ORDER — VENLAFAXINE HCL ER 75 MG PO CP24
225.0000 mg | ORAL_CAPSULE | Freq: Every day | ORAL | Status: DC
  Administered 2021-05-15 – 2021-05-16 (×2): 225 mg via ORAL
  Filled 2021-05-15 (×2): qty 1

## 2021-05-15 MED ORDER — ACETAMINOPHEN 650 MG RE SUPP: 650.0000 mg | Freq: Four times a day (QID) | RECTAL | Status: DC | PRN

## 2021-05-15 MED ORDER — METHOCARBAMOL 500 MG PO TABS: 750.0000 mg | ORAL_TABLET | Freq: Three times a day (TID) | ORAL | Status: DC | PRN

## 2021-05-15 MED ORDER — TRAZODONE HCL 50 MG PO TABS
150.0000 mg | ORAL_TABLET | Freq: Every day | ORAL | Status: DC
  Administered 2021-05-15 (×2): 150 mg via ORAL
  Filled 2021-05-15 (×2): qty 3

## 2021-05-15 MED ORDER — TRAMADOL HCL 50 MG PO TABS
50.0000 mg | ORAL_TABLET | Freq: Four times a day (QID) | ORAL | Status: DC | PRN
  Administered 2021-05-16: 50 mg via ORAL
  Filled 2021-05-15: qty 1

## 2021-05-15 MED ORDER — ENOXAPARIN SODIUM 40 MG/0.4ML IJ SOSY
40.0000 mg | PREFILLED_SYRINGE | INTRAMUSCULAR | Status: DC
  Administered 2021-05-15 – 2021-05-16 (×2): 40 mg via SUBCUTANEOUS
  Filled 2021-05-15 (×2): qty 0.4

## 2021-05-15 MED ORDER — SODIUM CHLORIDE 0.9 % IV SOLN
INTRAVENOUS | Status: DC
Start: 2021-05-15 — End: 2021-05-16

## 2021-05-15 MED ORDER — MAGNESIUM SULFATE 2 GM/50ML IV SOLN
2.0000 g | Freq: Once | INTRAVENOUS | Status: AC
  Administered 2021-05-15: 2 g via INTRAVENOUS
  Filled 2021-05-15: qty 50

## 2021-05-15 MED ORDER — SODIUM CHLORIDE 0.9 % IV SOLN
75.0000 mL/h | INTRAVENOUS | Status: DC
  Administered 2021-05-15: 75 mL/h via INTRAVENOUS

## 2021-05-15 MED ORDER — HYDROCODONE-ACETAMINOPHEN 5-325 MG PO TABS
1.0000 | ORAL_TABLET | ORAL | Status: DC | PRN
  Administered 2021-05-15: 2 via ORAL
  Filled 2021-05-15: qty 2

## 2021-05-15 MED ORDER — SODIUM CHLORIDE 0.9 % IV SOLN
2.0000 g | INTRAVENOUS | Status: DC
  Administered 2021-05-15: 2 g via INTRAVENOUS
  Filled 2021-05-15: qty 20

## 2021-05-15 MED ORDER — PANTOPRAZOLE SODIUM 40 MG PO TBEC
40.0000 mg | DELAYED_RELEASE_TABLET | Freq: Two times a day (BID) | ORAL | Status: DC
  Administered 2021-05-15 – 2021-05-16 (×4): 40 mg via ORAL
  Filled 2021-05-15 (×4): qty 1

## 2021-05-15 MED ORDER — ACETAMINOPHEN 325 MG PO TABS
650.0000 mg | ORAL_TABLET | Freq: Four times a day (QID) | ORAL | Status: DC | PRN
  Administered 2021-05-15: 650 mg via ORAL
  Filled 2021-05-15: qty 2

## 2021-05-15 MED ORDER — METOPROLOL TARTRATE 25 MG PO TABS
12.5000 mg | ORAL_TABLET | Freq: Two times a day (BID) | ORAL | Status: DC
Start: 2021-05-15 — End: 2021-05-16
  Administered 2021-05-15 – 2021-05-16 (×2): 12.5 mg via ORAL
  Filled 2021-05-15 (×2): qty 1

## 2021-05-15 NOTE — Assessment & Plan Note (Signed)
ForUnclear etiology chest x-ray unremarkable may be related to narcotics.  Denies any chest pain or shortness of breath.  Given history of obesity will check a D-dimer if elevated will pursue further investigation with CTA ?

## 2021-05-15 NOTE — Assessment & Plan Note (Signed)
Hold cardura and Hygroton ?restart lopressor if BP allows in AM ?

## 2021-05-15 NOTE — Assessment & Plan Note (Signed)
Ordered CPAP

## 2021-05-15 NOTE — Evaluation (Signed)
Occupational Therapy Evaluation ?Patient Details ?Name: Crystal Jackson ?MRN: [id number] ?DOB: 1945-11-11 ?Today's Date: 05/15/2021 ? ? ?History of Present Illness Crystal Jackson is a 76 y.o. female with medical history significant of hypertension, diabetes type 2, obesity, presents 05/14/21 with several days of  chills, fever, vomiting, suspected UTI.  ? ?Clinical Impression ?  ?Patient evaluated by Occupational Therapy with no further acute OT needs identified. All education has been completed and the patient has no further questions. Patient is at her baseline with SUP for functional mobility with patient a chronic furniture walker at home. Patient endorses being at her baseline. See below for any follow-up Occupational Therapy or equipment needs. OT is signing off. Thank you for this referral. ?  ?   ? ?Recommendations for follow up therapy are one component of a multi-disciplinary discharge planning process, led by the attending physician.  Recommendations may be updated based on patient status, additional functional criteria and insurance authorization.  ? ?Follow Up Recommendations ? No OT follow up  ?  ?Assistance Recommended at Discharge Frequent or constant Supervision/Assistance  ?Patient can return home with the following A little help with walking and/or transfers;A little help with bathing/dressing/bathroom;Assistance with cooking/housework;Direct supervision/assist for financial management;Assist for transportation;Help with stairs or ramp for entrance;Direct supervision/assist for medications management ? ?  ?Functional Status Assessment ? Patient has not had a recent decline in their functional status  ?Equipment Recommendations ?    ?  ?Recommendations for Other Services   ? ? ?  ?Precautions / Restrictions Precautions ?Precautions: Fall ?Restrictions ?Weight Bearing Restrictions: No  ? ?  ? ?Mobility Bed Mobility ?Overal bed mobility: Independent ?  ?  ?  ?  ?  ?  ?  ?  ? ?Transfers ?  ?  ?  ?  ?  ?  ?   ?  ?  ?  ?  ? ?  ?Balance Overall balance assessment: Mild deficits observed, not formally tested ?  ?  ?  ?  ?  ?  ?  ?  ?  ?  ?  ?  ?  ?  ?  ?  ?  ?  ?   ? ?ADL either performed or assessed with clinical judgement  ? ?ADL Overall ADL's : At baseline ?  ?  ?  ?  ?  ?  ?  ?  ?  ?  ?  ?  ?  ?  ?  ?  ?  ?  ?  ?General ADL Comments: patient is at her baseline with patient reporting furniture walking at home with husband SUP. patient was able to complete simulated toileting tasks, standing grooming tasks at sink. LB dressing tasks, functional mobility in room, transfers and bed mobility with SUP with patient endorsing being at baseline at this time. patient was educated on ECT for transitioning back home. patient vebralized Crystal Jackson adn reported her husband tells her the same thing.  ? ? ? ?Vision Patient Visual Report: No change from baseline ?   ?   ?Perception   ?  ?Praxis   ?  ? ?Pertinent Vitals/Pain Pain Assessment ?Pain Assessment: No/denies pain  ? ? ? ?Hand Dominance Right ?  ?Extremity/Trunk Assessment Upper Extremity Assessment ?Upper Extremity Assessment: Overall WFL for tasks assessed ?  ?Lower Extremity Assessment ?Lower Extremity Assessment: Defer to PT evaluation ?  ?Cervical / Trunk Assessment ?Cervical / Trunk Assessment: Normal ?  ?Communication Communication ?Communication: No difficulties ?  ?Cognition Arousal/Alertness: Awake/alert ?Behavior During Therapy:  WFL for tasks assessed/performed ?Overall Cognitive Status: Within Functional Limits for tasks assessed ?  ?  ?  ?  ?  ?  ?  ?  ?  ?  ?  ?  ?  ?  ?  ?  ?  ?  ?  ?General Comments    ? ?  ?Exercises   ?  ?Shoulder Instructions    ? ? ?Home Living Family/patient expects to be discharged to:: Private residence ?Living Arrangements: Spouse/significant other ?Available Help at Discharge: Family;Available 24 hours/day ?Type of Home: House ?Home Access: Stairs to enter ?Entrance Stairs-Number of Steps: 3  in garage , ?Entrance Stairs-Rails:  Right ?Home Layout: One level ?  ?  ?Bathroom Shower/Tub: Walk-in shower ?  ?  ?  ?  ?Home Equipment: Gilmer Mor - single point ?  ?  ?  ? ?  ?Prior Functioning/Environment Prior Level of Function : Independent/Modified Independent ?  ?  ?  ?  ?  ?  ?Mobility Comments: reports having balance issues, will hold onto objects. States that she does OK  when no objects such as a parking lot. " I just take my time." ?ADLs Comments: IADLs ?  ? ?  ?  ?OT Problem List:   ?  ?   ?OT Treatment/Interventions:    ?  ?OT Goals(Current goals can be found in the care plan section) Acute Rehab OT Goals ?OT Goal Formulation: All assessment and education complete, DC therapy  ?OT Frequency:   ?  ? ?Co-evaluation   ?  ?  ?  ?  ? ?  ?AM-PAC OT "6 Clicks" Daily Activity     ?Outcome Measure Help from another person eating meals?: None ?Help from another person taking care of personal grooming?: None ?Help from another person toileting, which includes using toliet, bedpan, or urinal?: A Little ?Help from another person bathing (including washing, rinsing, drying)?: A Little ?Help from another person to put on and taking off regular upper body clothing?: None ?Help from another person to put on and taking off regular lower body clothing?: A Little ?6 Click Score: 21 ?  ?End of Session Nurse Communication: Mobility status ? ?Activity Tolerance: Patient tolerated treatment well ?Patient left: in chair;with call bell/phone within reach ? ?OT Visit Diagnosis: History of falling (Z91.81);Unsteadiness on feet (R26.81)  ?              ?Time: 6195-0932 ?OT Time Calculation (min): 17 min ?Charges:  OT General Charges ?$OT Visit: 1 Visit ?OT Evaluation ?$OT Eval Low Complexity: 1 Low ? ?Crystal Jackson OTR/L, MS ?Acute Rehabilitation Department ?Office# [phone] ?Pager# [phone] ? ? ?Crystal Jackson Crystal Jackson ?05/15/2021, 12:45 PM ?

## 2021-05-15 NOTE — Assessment & Plan Note (Signed)
tachycardia   , fever  ?Today's Vitals  ? 05/14/21 2030 05/14/21 2100 05/14/21 2219 05/14/21 2253  ?BP: 117/74 (!) 118/52 121/62   ?Pulse: 97 (!) 101 97   ?Resp: 16 18 17    ?Temp:  97.6 ?F (36.4 ?C) 98.5 ?F (36.9 ?C)   ?TempSrc:   Oral   ?SpO2: 95% 93% 93%   ?Weight:      ?Height:      ?PainSc:    3   ? ?  ? ?The recent clinical data is shown below. ?Vitals:  ? 05/14/21 2000 05/14/21 2030 05/14/21 2100 05/14/21 2219  ?BP: 127/63 117/74 (!) 118/52 121/62  ?Pulse: 98 97 (!) 101 97  ?Resp: 16 16 18 17   ?Temp:   97.6 ?F (36.4 ?C) 98.5 ?F (36.9 ?C)  ?TempSrc:    Oral  ?SpO2: 92% 95% 93% 93%  ?Weight:      ?Height:      ? ?  ?-Most likely source being:  Urinary  ? Patient meeting criteria for Severe sepsis with  ?  evidence of end organ damage/organ dysfunction such as  ?  elevated lactic acid >2  ?   ?Component Value Date/Time  ? LATICACIDVEN 2.8 ([place]) 05/14/2021 1932  ? ?  ? Acute hypoxia requiring new supplemental oxygen, SpO2: 93 % ?O2 Flow Rate (L/min): 2 L/min ?  ? - Obtain serial lactic acid and procalcitonin level. ? - Initiated IV antibiotics in ER: ?Antibiotics Given (last 72 hours)   ? Date/Time Action Medication Dose Rate  ? 05/14/21 1811 New Bag/Given  ? cefTRIAXone (ROCEPHIN) 2 g in sodium chloride 0.9 % 100 mL IVPB 2 g 200 mL/hr  ?  ? ? ?Will continue  on : rocephin ? ? - await results of blood and urine culture ? - Rehydrate aggressively  ?Intravenous fluids were administered,   ?  ?12:35 AM ? ?

## 2021-05-15 NOTE — Assessment & Plan Note (Signed)
choronic stable continue home medications ?

## 2021-05-15 NOTE — Evaluation (Signed)
Physical Therapy Evaluation ?Patient Details ?Name: Crystal Jackson ?MRN: [id number] ?DOB: 05-Feb-1945 ?Today's Date: 05/15/2021 ? ?History of Present Illness ? Crystal Jackson is a 76 y.o. female with medical history significant of hypertension, diabetes type 2, obesity, presents 05/14/21 with several days of  chills, fever, vomiting, suspected UTI.  ?Clinical Impression ?  Patient   reports having a "gait disturbance" PTA, that she sometimes uses a cane  or holds onto objects. Reports that she drives and is able to ambulate in community. Reports dizziness at times, has had vestibular problems R/O in past.  ?Patient may benefit from OPPT  for improving gait and strength. Patient currently is functional ambulator. Pt admitted with above diagnosis.  Pt currently with functional limitations due to the deficits listed below (see PT Problem List). Pt will benefit from skilled PT to increase their independence and safety with mobility to allow discharge to the venue listed below.   ?  ? ?Recommendations for follow up therapy are one component of a multi-disciplinary discharge planning process, led by the attending physician.  Recommendations may be updated based on patient status, additional functional criteria and insurance authorization. ? ?Follow Up Recommendations Outpatient PT ? ?  ?Assistance Recommended at Discharge PRN  ?Patient can return home with the following ? Assistance with cooking/housework;Assist for transportation;Help with stairs or ramp for entrance ? ?  ?Equipment Recommendations None recommended by PT  ?Recommendations for Other Services ?    ?  ?Functional Status Assessment Patient has not had a recent decline in their functional status  ? ?  ?Precautions / Restrictions Precautions ?Precautions: Fall  ? ?  ? ?Mobility ? Bed Mobility ?Overal bed mobility: Independent ?  ?  ?  ?  ?  ?  ?  ?  ? ?Transfers ?Overall transfer level: Modified independent ?  ?  ?  ?  ?  ?  ?  ?  ?General transfer comment: did not   extra effort to rise from bed. ?  ? ?Ambulation/Gait ?Ambulation/Gait assistance: Supervision ?Gait Distance (Feet): 20 Feet (x 2) ?Assistive device: None, IV Pole ?Gait Pattern/deviations: Step-through pattern ?Gait velocity: decr ?  ?  ?General Gait Details: gait steady and slow. Noted to reach to hold door frame, able to ambulate without  support in room. ? ?Stairs ?  ?  ?  ?  ?  ? ?Wheelchair Mobility ?  ? ?Modified Rankin (Stroke Patients Only) ?  ? ?  ? ?Balance Overall balance assessment: Mild deficits observed, not formally tested ?  ?  ?  ?  ?  ?  ?  ?  ?  ?  ?  ?  ?  ?  ?  ?  ?  ?  ?   ? ? ? ?Pertinent Vitals/Pain Pain Assessment ?Pain Assessment: No/denies pain  ? ? ?Home Living Family/patient expects to be discharged to:: Private residence ?Living Arrangements: Spouse/significant other ?Available Help at Discharge: Family;Available 24 hours/day ?Type of Home: House ?Home Access: Stairs to enter ?Entrance Stairs-Rails: Right ?Entrance Stairs-Number of Steps: 3  in garage , ?  ?Home Layout: One level ?Home Equipment: Gilmer Mor - single point ?   ?  ?Prior Function Prior Level of Function : Independent/Modified Independent ?  ?  ?  ?  ?  ?  ?Mobility Comments: reports having balance issues, will hold onto objects. States that she does OK  when no objects such as a parking lot. " I just take my time." ?ADLs Comments: IADLs ?  ? ? ?  Hand Dominance  ? Dominant Hand: Right ? ?  ?Extremity/Trunk Assessment  ? Upper Extremity Assessment ?Upper Extremity Assessment: Overall WFL for tasks assessed ?  ? ?Lower Extremity Assessment ?Lower Extremity Assessment: RLE deficits/detail;LLE deficits/detail ?RLE Deficits / Details: reports distal foot numbness,  strength WFL, ?LLE Deficits / Details: same as right ?  ? ?Cervical / Trunk Assessment ?Cervical / Trunk Assessment: Normal  ?Communication  ? Communication: No difficulties  ?Cognition Arousal/Alertness: Awake/alert ?Behavior During Therapy: [facility] for tasks  assessed/performed ?Overall Cognitive Status: Within Functional Limits for tasks assessed ?  ?  ?  ?  ?  ?  ?  ?  ?  ?  ?  ?  ?  ?  ?  ?  ?  ?  ?  ? ?  ?General Comments General comments (skin integrity, edema, etc.): pt. reports having dizziness  at times, has been R/O for  vestibular problems. ? ?  ?Exercises    ? ?Assessment/Plan  ?  ?PT Assessment Patient needs continued PT services  ?PT Problem List Decreased activity tolerance;Decreased mobility;Impaired sensation ? ?   ?  ?PT Treatment Interventions Gait training;Functional mobility training;Therapeutic activities;Patient/family education   ? ?PT Goals (Current goals can be found in the Care Plan section)  ?Acute Rehab PT Goals ?Patient Stated Goal: to go home ?PT Goal Formulation: With patient ?Time For Goal Achievement: 05/22/21 ?Potential to Achieve Goals: Good ? ?  ?Frequency Min 3X/week ?  ? ? ?Co-evaluation   ?  ?  ?  ?  ? ? ?  ?AM-PAC PT "6 Clicks" Mobility  ?Outcome Measure Help needed turning from your back to your side while in a flat bed without using bedrails?: None ?Help needed moving from lying on your back to sitting on the side of a flat bed without using bedrails?: None ?Help needed moving to and from a bed to a chair (including a wheelchair)?: None ?Help needed standing up from a chair using your arms (e.g., wheelchair or bedside chair)?: None ?Help needed to walk in hospital room?: A Little ?Help needed climbing 3-5 steps with a railing? : A Little ?6 Click Score: 22 ? ?  ?End of Session   ?Activity Tolerance: Patient tolerated treatment well ?Patient left: in chair;with call bell/phone within reach ?Nurse Communication: Mobility status ?PT Visit Diagnosis: Unsteadiness on feet (R26.81);Difficulty in walking, not elsewhere classified (R26.2) ?  ? ?Time: 1017-5102 ?PT Time Calculation (min) (ACUTE ONLY): 11 min ? ? ?Charges:   PT Evaluation ?$PT Eval Low Complexity: 1 Low ?  ?  ?   ? ? ?Blanchard Kelch PT ?Acute Rehabilitation Services ?Pager  [phone] ?Office [phone] ? ? ?Cady Hafen, Jobe Igo ?05/15/2021, 8:46 AM ? ?

## 2021-05-15 NOTE — Progress Notes (Addendum)
[facility] Progress Note  [name]     PXT:[id number]  DOA: 05/14/2021   PCP: Melida Quitter, MD       Brief hospital course: This is a 76 year old female who presents to the ED for vomiting and fever.  Medical history of diabetes, asthma obstructive sleep apnea, and hypertension.  She believes she has a UTI on presentation to the ED reported pain in her back with suprapubic pressure.  She was using Pyridium for dysuria.She went to see her PCP on Friday for urinary symptoms and was told her UA was clear. This weekend, she developed rigors, suprapubic and back pain and was sure she had a UTI and presented to the ED.   In the ED, noted to have a temperature of 99.6, respiratory rate of 22, WBC count of 16.3, lactic acid of 3.1, procalcitonin of 11.68, sodium 128, potassium 3.4, creatinine 1.09, magnesium 1.4.  She underwent a CT scan of the abdomen pelvis that was unrevealing.  Blood cultures obtained and started on ceftriaxone.  CVA lactated Ringer's bolus followed by infusion.  Subjective:  Feels better today. Still very weak.   Assessment and Plan: Principal Problem:   UTI (urinary tract infection) with severe sepsis - cont Ceftriaxone - WBC 16- lactic acid 3.1 has normalized - procalcitonin 11.68 - f/u blood cultures and urine culture  Active Problems:   Hypoxia - cause uncertain but has resolved  Hyponatremia - sodium 129 - stop Clorthalidone - cont NS and follow    OSA on CPAP - cont CPAP    Mild persistent asthma without complication - no wheeze currently - PRN albuterol      Essential hypertension - BP slightly low - cont Metoprolol with holding parameters    DM (diabetes mellitus), type 2 (HCC) Hemoglobin A1C    Component Value Date/Time   HGBA1C 5.7 (H) 02/07/2021 1207  - diet control    Hypomagnesemia - replaced   DVT prophylaxis:  enoxaparin (LOVENOX) injection 40 mg Start: 05/15/21 1000     Code Status: Full Code   Consultants: none Level of Care: Level of care: Telemetry Disposition Plan:  Status is: Inpatient Remains inpatient appropriate because: treating sepsis and hyponatremia  Objective:   Vitals:   05/14/21 2219 05/15/21 0109 05/15/21 0211 05/15/21 0528  BP: 121/62  (!) 112/51 (!) 115/50  Pulse: 97  97 80  Resp: 17 18 20  (!) 24  Temp: 98.5 F (36.9 C)  98.6 F (37 C) 98.1 F (36.7 C)  TempSrc: Oral  Oral Oral  SpO2: 93%  97% 98%  Weight:      Height:       Filed Weights   05/14/21 1609  Weight: 96.6 kg   Exam: General exam: Appears comfortable  HEENT: PERRLA, oral mucosa moist, no sclera icterus or thrush Respiratory system: Clear to auscultation. Respiratory effort normal. Cardiovascular system: S1 & S2 heard, regular rate and rhythm Gastrointestinal system: Abdomen soft, non-tender, nondistended. Normal bowel sounds   Central nervous system: Alert and oriented. No focal neurological deficits. Extremities: No cyanosis, clubbing or edema Skin: No rashes or ulcers Psychiatry:  Mood & affect appropriate.    Imaging and lab data was personally reviewed    CBC: Recent Labs  Lab 05/14/21 1619 05/15/21 0246  WBC 4.7 16.3*  HGB 13.5 10.8*  HCT 38.8 32.2*  MCV 86.6 89.2  PLT 162 139*   Basic Metabolic Panel: Recent Labs  Lab 05/14/21 1619 05/14/21 2238 05/15/21 0246 05/15/21 0542  NA  129* 128* 128* 129*  K 3.5 3.4* 3.6 3.8  CL 89* 91* 92* 91*  CO2 27 29 29 30   GLUCOSE 106* 154* 155* 146*  BUN 18 18 17 15   CREATININE 0.96 1.09* 0.95 0.98  CALCIUM 9.7 8.7* 8.6* 8.8*  MG  --  1.4* 1.8  --   PHOS  --  2.9  --   --    GFR: Estimated Creatinine Clearance: 56.8 mL/min (by C-G formula based on SCr of 0.98 mg/dL).  Scheduled Meds:  enoxaparin (LOVENOX) injection  40 mg Subcutaneous Q24H   metoprolol tartrate  12.5 mg Oral BID   pantoprazole  40 mg Oral BID   traZODone  150 mg Oral QHS   venlafaxine XR  225 mg Oral Q breakfast   Continuous Infusions:   sodium chloride 100 mL/hr (05/15/21 0159)   cefTRIAXone (ROCEPHIN)  IV       LOS: 1 day   Author:  05/15/2021 8:40 AM

## 2021-05-15 NOTE — Subjective & Objective (Signed)
Vomiting and fever suspected UTI she went to [facility] was told that her urine was fine and she did not need antibiotics but she has been having weakness fatigue and chills ?Patient reports pain in her back and both sides and suprapubic pressure she was sent to  get CT scan abdomen done to rule out diverticulitis and that was nonacute.  She been using Pyridium to try to help with dysuria and that improved it somewhat but given increased cheese also evaluated she presented to emergency department ?Otherwise she has not had any cough no chest pain or shortness of breath no rashes ?

## 2021-05-15 NOTE — Assessment & Plan Note (Signed)
Will replace ?

## 2021-05-15 NOTE — Assessment & Plan Note (Signed)
-   Order Sensitive  SSI  ? , ? -  check TSH and HgA1C ? - Hold by mouth medications  ? ? ?

## 2021-05-15 NOTE — H&P (Signed)
? ? ?Crystal Jackson ACZ:[id number] DOB: June 15, 1945 DOA: 05/14/2021 ? ? ?  ?PCP: Melida Quitter, MD   ?Outpatient Specialists:   ? Pulmonology [facility] ? ?Patient arrived to ER on 05/14/21 at 1550 ?Referred by Attending John [name], MD ? ? ?Patient coming from:   ? home Lives  With family ?  ? ?Chief Complaint:   ?Chief Complaint  ?Patient presents with  ? Urinary Tract Infection  ? Fever  ? ? ?HPI: ?Crystal Jackson is a 76 y.o. female with medical history significant of hypertension diabetes type 2 obesity ?  ? ?Presented with   chills and fever for the past 4 days ?Vomiting and fever suspected UTI she went to [facility] was told that her urine was fine and she did not need antibiotics but she has been having weakness fatigue and chills ?Patient reports pain in her back and both sides and suprapubic pressure she was sent to  get CT scan abdomen done to rule out diverticulitis and that was nonacute.  She been using Pyridium to try to help with dysuria and that improved it somewhat but given increased cheese also evaluated she presented to emergency department ?Otherwise she has not had any cough no chest pain or shortness of breath no rashes ?  ?Denies sick contacts ?Some nausea and vomiting ?Does not drink no smoking ?Reports severe back  ? Initial COVID TEST  in house  PCR testing  Pending ? ?No results found for: SARSCOV2NAA ?  ?Regarding pertinent Chronic problems:   ?  ? ? HTN on an Hygroton Cardura Lopressor ? ? ?  DM 2 -  ?Lab Results  ?Component Value Date  ? HGBA1C 5.7 (H) 02/07/2021  ? on PO meds ?  ? obesity-   ?BMI Readings from Last 1 Encounters:  ?05/14/21 36.56 kg/m?  ? ?  ?  Asthma -well   controlled on home inhalers/ nebs  ?              ?  OSA -on nocturnal CPAP,   ?   ?While in ER: ?  ?Transient hypoxic on Ra down to high 80 ?After pain meds ? No CP  ? ?Ordered  ?CXR - Mild left basilar atelectasis  ? ?CTabd/pelvis -  non- acute ?   ?Following Medications were ordered in  ER: ?Medications  ?lactated ringers infusion ( Intravenous New Bag/Given 05/14/21 2028)  ?lactated ringers infusion (0 mLs Intravenous Hold 05/14/21 2050)  ?ondansetron [facility]) injection 4 mg (4 mg Intravenous Given 05/14/21 1631)  ?lactated ringers bolus 500 mL (0 mLs Intravenous Stopped 05/14/21 2047)  ?ondansetron [facility]) injection 4 mg (4 mg Intravenous Given 05/14/21 2219)  ?HYDROmorphone (DILAUDID) injection 0.5 mg (0.5 mg Intravenous Given 05/14/21 1743)  ?cefTRIAXone (ROCEPHIN) 2 g in sodium chloride 0.9 % 100 mL IVPB (0 g Intravenous Stopped 05/14/21 1844)  ?lactated ringers bolus 1,000 mL (0 mLs Intravenous Stopped 05/14/21 1911)  ?   ?  ?ED Triage Vitals  ?Enc Vitals Group  ?   BP 05/14/21 1611 (!) 169/76  ?   Pulse Rate 05/14/21 1611 89  ?   Resp 05/14/21 1611 (!) 22  ?   Temp 05/14/21 1611 99.6 ?F (37.6 ?C)  ?   Temp Source 05/14/21 2219 Oral  ?   SpO2 05/14/21 1611 95 %  ?   Weight 05/14/21 1609 213 lb (96.6 kg)  ?   Height 05/14/21 1609 5\' 4"  (1.626 m)  ?   Head Circumference --   ?  Peak Flow --   ?   Pain Score 05/14/21 1757 2  ?   Pain Loc --   ?   Pain Edu? --   ?   Excl. in GC? --   ?RUEA(54)@TMAX(24)@    ? _________________________________________ ?Significant initial  Findings: ?Abnormal Labs Reviewed  ?COMPREHENSIVE METABOLIC PANEL - Abnormal; Notable for the following components:  ?    Result Value  ? Sodium 129 (*)   ? Chloride 89 (*)   ? Glucose, Bld 106 (*)   ? Alkaline Phosphatase 36 (*)   ? All other components within normal limits  ?URINALYSIS, ROUTINE W REFLEX MICROSCOPIC - Abnormal; Notable for the following components:  ? Protein, ur 30 (*)   ? Leukocytes,Ua MODERATE (*)   ? WBC, UA >50 (*)   ? Bacteria, UA MANY (*)   ? All other components within normal limits  ?LACTIC ACID, PLASMA - Abnormal; Notable for the following components:  ? Lactic Acid, Venous 3.1 (*)   ? All other components within normal limits  ?LACTIC ACID, PLASMA - Abnormal; Notable for the following components:  ? Lactic Acid,  Venous 2.8 (*)   ? All other components within normal limits  ? ?   ?ECG:  personally reviewed ?HR 94 ?NSR no ischemia ?QRS 462 ?  ? ?_______________ ?This patient meets SIRS Criteria and may be septic. ?   ?The recent clinical data is shown below. ?Vitals:  ? 05/14/21 2000 05/14/21 2030 05/14/21 2100 05/14/21 2219  ?BP: 127/63 117/74 (!) 118/52 121/62  ?Pulse: 98 97 (!) 101 97  ?Resp: 16 16 18 17   ?Temp:   97.6 ?F (36.4 ?C) 98.5 ?F (36.9 ?C)  ?TempSrc:    Oral  ?SpO2: 92% 95% 93% 93%  ?Weight:      ?Height:      ?  ?WBC ? ?   ?Component Value Date/Time  ? WBC 4.7 05/14/2021 1619  ? LYMPHSABS 2.0 07/09/2018 1609  ? MONOABS 0.4 10/24/2015 1306  ? EOSABS 0.2 07/09/2018 1609  ? BASOSABS 0.0 07/09/2018 1609  ?  ?Lactic Acid, Venous ?   ?Component Value Date/Time  ? LATICACIDVEN 2.8 (HH) 05/14/2021 1932  ? ? ? ?Procalcitonin   Ordered ?Lactic Acid, Venous ?   ?Component Value Date/Time  ? LATICACIDVEN 2.8 (HH) 05/14/2021 1932  ?  ? ? UA   evidence of UTI    ?  ?Urine analysis: ?   ?Component Value Date/Time  ? COLORURINE YELLOW 05/14/2021 1701  ? APPEARANCEUR CLEAR 05/14/2021 1701  ? LABSPEC 1.015 05/14/2021 1701  ? PHURINE 7.5 05/14/2021 1701  ? GLUCOSEU NEGATIVE 05/14/2021 1701  ? HGBUR NEGATIVE 05/14/2021 1701  ? BILIRUBINUR NEGATIVE 05/14/2021 1701  ? KETONESUR NEGATIVE 05/14/2021 1701  ? PROTEINUR 30 (A) 05/14/2021 1701  ? NITRITE NEGATIVE 05/14/2021 1701  ? LEUKOCYTESUR MODERATE (A) 05/14/2021 1701  ? ? ?Results for orders placed or performed during the hospital encounter of 10/24/15  ?Culture, blood (Routine x 2)     Status: None  ? Collection Time: 10/24/15  1:05 PM  ? Specimen: BLOOD  ?Result Value Ref Range Status  ? Specimen Description BLOOD RIGHT ANTECUBITAL  Final  ? Special Requests BOTTLES DRAWN AEROBIC AND ANAEROBIC 5ML  Final  ? Culture NO GROWTH 5 DAYS  Final  ? Report Status 10/29/2015 FINAL  Final  ?Urine culture     Status: Abnormal  ? Collection Time: 10/24/15  1:14 PM  ? Specimen: Urine, Random   ?Result Value Ref Range  Status  ? Specimen Description URINE, RANDOM  Final  ? Special Requests NONE  Final  ? Culture MULTIPLE SPECIES PRESENT, SUGGEST RECOLLECTION (A)  Final  ? Report Status 10/25/2015 FINAL  Final  ?Culture, blood (Routine x 2)     Status: None  ? Collection Time: 10/24/15  1:15 PM  ? Specimen: BLOOD  ?Result Value Ref Range Status  ? Specimen Description BLOOD LEFT ANTECUBITAL  Final  ? Special Requests BOTTLES DRAWN AEROBIC AND ANAEROBIC  Final  ? Culture NO GROWTH 5 DAYS  Final  ? Report Status 10/29/2015 FINAL  Final  ? ? ? ?_______________________________________________ ?Hospitalist was called for admission for   Pyelonephritis  ?  ?The following Work up has been ordered so far: ? ?Orders Placed This Encounter  ?Procedures  ? Resp Panel by RT-PCR (Flu A&B, Covid) Nasopharyngeal Swab  ? Culture, blood (routine x 2)  ? Urine Culture  ? DG Chest Port 1 View  ? Lipase, blood  ? Comprehensive metabolic panel  ? CBC  ? Urinalysis, Routine w reflex microscopic  ? Lactic acid, plasma  ? Diet NPO time specified  ? Bladder scan  ? DO NOT delay antibiotics if unable to obtain blood culture.  ? Cardiac monitoring  ? Code Sepsis activation.  This occurs automatically when order is signed and prioritizes pharmacy, lab, and radiology services for STAT collections and interventions.  If [facility] downtime, call [facility] [phone]) to activate Code Sepsis.  ? pharmacy consult  ? Consult to hospitalist  ? Airborne and Contact precautions  ? Oxygen therapy Mode or (Route): Nasal cannula; Liters Per Minute: 2; Keep 02 saturation: <92%  ? CPAP  ? EKG 12-Lead  ? Insert 2nd peripheral IV if not already present.  ? Admit to Inpatient (patient's expected length of stay will be greater than 2 midnights or inpatient only procedure)  ?  ? ?OTHER Significant initial  Findings: ? ?labs showing:  ?  ?Recent Labs  ?Lab 05/14/21 ?1619  ?NA 129*  ?K 3.5  ?CO2 27  ?GLUCOSE 106*  ?BUN 18  ?CREATININE 0.96  ?CALCIUM 9.7   ? ? ?Cr  stable,    ?Lab Results  ?Component Value Date  ? CREATININE 0.96 05/14/2021  ? CREATININE 1.03 (H) 02/07/2021  ? CREATININE 0.92 06/29/2020  ? ? ?Recent Labs  ?Lab 05/14/21 ?1619  ?AST 22  ?ALT 20  ?ALKPHOS

## 2021-05-15 NOTE — Assessment & Plan Note (Signed)
-   treat with Rocephin         await results of urine culture and adjust antibiotic coverage as needed  

## 2021-05-16 DIAGNOSIS — N12 Tubulo-interstitial nephritis, not specified as acute or chronic: Secondary | ICD-10-CM | POA: Diagnosis not present

## 2021-05-16 DIAGNOSIS — N3000 Acute cystitis without hematuria: Secondary | ICD-10-CM | POA: Diagnosis not present

## 2021-05-16 DIAGNOSIS — E119 Type 2 diabetes mellitus without complications: Secondary | ICD-10-CM | POA: Diagnosis not present

## 2021-05-16 DIAGNOSIS — J9601 Acute respiratory failure with hypoxia: Secondary | ICD-10-CM | POA: Diagnosis not present

## 2021-05-16 LAB — BASIC METABOLIC PANEL
Anion gap: 7 (ref 5–15)
BUN: 15 mg/dL (ref 8–23)
CO2: 29 mmol/L (ref 22–32)
Calcium: 8.2 mg/dL — ABNORMAL LOW (ref 8.9–10.3)
Chloride: 100 mmol/L (ref 98–111)
Creatinine, Ser: 0.97 mg/dL (ref 0.44–1.00)
GFR, Estimated: >60 mL/min (ref >60)
Glucose, Bld: 113 mg/dL — ABNORMAL HIGH (ref 70–99)
Potassium: 3.9 mmol/L (ref 3.5–5.1)
Sodium: 136 mmol/L (ref 135–145)

## 2021-05-16 LAB — CBC
HCT: 34.5 % — ABNORMAL LOW (ref 36.0–46.0)
Hemoglobin: 11.5 g/dL — ABNORMAL LOW (ref 12.0–15.0)
MCH: 30.5 pg (ref 26.0–34.0)
MCHC: 33.3 g/dL (ref 30.0–36.0)
MCV: 91.5 fL (ref 80.0–100.0)
Platelets: 148 10*3/uL — ABNORMAL LOW (ref 150–400)
RBC: 3.77 MIL/uL — ABNORMAL LOW (ref 3.87–5.11)
RDW: 13.2 % (ref 11.5–15.5)
WBC: 9.3 10*3/uL (ref 4.0–10.5)
nRBC: 0 % (ref 0.0–0.2)

## 2021-05-16 MED ORDER — CEFUROXIME AXETIL 500 MG PO TABS: 500.0000 mg | ORAL_TABLET | Freq: Two times a day (BID) | ORAL | 0 refills | Status: AC

## 2021-05-16 NOTE — Discharge Summary (Addendum)
Physician Discharge Summary  Crystal CrouchRebecca Jackson BJY:[id number] DOB: [birth year] DOA: 05/14/2021  PCP: Melida QuitterWile, Laura H, MD  Admit date: 05/14/2021 Discharge date: 05/27/2021 Discharging to: Home leukopenia really fatty all I had a meeting still Recommendations for Outpatient Follow-up:  F/u on urine culture Consider urology consult  Consults:  none Procedures:  none   Discharge Diagnoses:   Principal Problem:   UTI (urinary tract infection) Active Problems:   Hypoxia   OSA on CPAP   Mild persistent asthma without complication   Sepsis (HCC)   Essential hypertension   DM (diabetes mellitus), type 2 (HCC)   Hypomagnesemia     Hospital Course: This is a 76 year old female who presents to the ED for vomiting and fever.  Medical history of diabetes, asthma obstructive sleep apnea, and hypertension.  She believes she has a UTI on presentation to the ED reported pain in her back with suprapubic pressure.  She was using Pyridium for dysuria.She went to see her PCP on Friday for urinary symptoms and was told her UA was clear. This weekend, she developed rigors, suprapubic and back pain and was sure she had a UTI and presented to the ED.    In the ED, noted to have a temperature of 99.6, respiratory rate of 22, WBC count of 16.3, lactic acid of 3.1, procalcitonin of 11.68, sodium 128, potassium 3.4, creatinine 1.09, magnesium 1.4.  She underwent a CT scan of the abdomen pelvis that was unrevealing.  Blood cultures obtained and started on ceftriaxone.  CVA lactated Ringer's bolus followed by infusion.  Principal Problem:   UTI (urinary tract infection) with severe sepsis - cont Ceftriaxone - WBC 16- lactic acid 3.1 has normalized - procalcitonin 11.68 -  blood cultures negative - urine culture growing 100,000 e coli- dc home with ceftin   Active Problems:     Acute respiratory failure with hypoxia (HCC) - cause uncertain but has resolved   Hyponatremia - sodium 129 - stopped  Clorthalidone - improved to 136 today after NS infusion     OSA on CPAP - cont CPAP     Mild persistent asthma without complication - no wheeze currently - PRN albuterol      Essential hypertension - BP slightly low - cont Metoprolol with holding parameters     DM (diabetes mellitus), type 2 (HCC) Hemoglobin A1C Labs (Brief)          Component Value Date/Time    HGBA1C 5.7 (H) 02/07/2021 1207    - diet control     Hypomagnesemia - replaced    Discharge Instructions  Discharge Instructions     Diet - low sodium heart healthy   Complete by: As directed    Increase activity slowly   Complete by: As directed       Allergies as of 05/16/2021       Reactions   Avapro [irbesartan] Shortness Of Breath   Dextromethorphan Hives, Shortness Of Breath   Guaifenesin & Derivatives Hives, Shortness Of Breath   Valtrex [valacyclovir] Hives, Shortness Of Breath   Citalopram    Other reaction(s): Chills (intolerance)   Codeine Nausea And Vomiting   Duloxetine    Feel like in a fog Other reaction(s): Other (See Comments) Feel like in a fog   Penicillin G Rash   Vilazodone Nausea Only        Medication List     STOP taking these medications    chlorthalidone 25 MG tablet Commonly known as: HYGROTON   methylPREDNISolone 4 MG  Tbpk tablet Commonly known as: MEDROL DOSEPAK       TAKE these medications    albuterol 108 (90 Base) MCG/ACT inhaler Commonly known as: VENTOLIN HFA Inhale 1-2 puffs into the lungs every 6 (six) hours as needed for wheezing or shortness of breath.   Arnuity Ellipta 200 MCG/ACT Aepb Generic drug: Fluticasone Furoate Inhale 1 puff into the lungs daily.   Biotene OralBalance Dry Mouth Gel Use as directed 1 application. in the mouth or throat in the morning, at noon, in the evening, and at bedtime.   Calcium Carbonate-Vitamin D 600-400 MG-UNIT tablet 1 po qd What changed:  how much to take how to take this when to take  this additional instructions   chlorpheniramine 4 MG tablet Commonly known as: CHLOR-TRIMETON Take 1 tablet (4 mg total) by mouth every 6 (six) hours. What changed:  when to take this reasons to take this   clotrimazole-betamethasone cream Commonly known as: LOTRISONE 1 application. daily as needed (eczema).   diclofenac Sodium 1 % Gel Commonly known as: VOLTAREN 2 g daily as needed (pain).   doxazosin 1 MG tablet Commonly known as: CARDURA Take 1 mg by mouth at bedtime.   Fish Oil 500 MG Caps Take 500 mg by mouth daily.   Insulin Pen Needle 32G X 4 MM Misc Use once daily   Magnesium 500 MG Caps Take 500 mg by mouth at bedtime.   methocarbamol 750 MG tablet Commonly known as: ROBAXIN Take 750 mg by mouth every 8 (eight) hours as needed for muscle spasms.   metoprolol tartrate 25 MG tablet Commonly known as: LOPRESSOR Take 50 mg by mouth 2 (two) times daily.   multivitamin tablet Take 1 tablet by mouth daily.   pantoprazole 40 MG tablet Commonly known as: PROTONIX Take 40 mg by mouth 2 (two) times daily.   potassium chloride 10 MEQ tablet Commonly known as: KLOR-CON 20 mEq 2 (two) times daily.   SUPER B COMPLEX/C PO Take 1 tablet by mouth daily.   SYSTANE COMPLETE OP Place 1 drop into both eyes See admin instructions. Take 5 times a day - Systane without preservatives   traZODone 100 MG tablet Commonly known as: DESYREL Take 150 mg by mouth at bedtime.   triamcinolone cream 0.1 % Commonly known as: KENALOG Apply 1 application. topically daily as needed (itching).   venlafaxine XR 75 MG 24 hr capsule Commonly known as: EFFEXOR-XR 225 mg daily with breakfast.       ASK your doctor about these medications    cefUROXime 500 MG tablet Commonly known as: CEFTIN Take 1 tablet (500 mg total) by mouth 2 (two) times daily with a meal for 5 days. Ask about: Should I take this medication?            The results of significant diagnostics from  this hospitalization (including imaging, microbiology, ancillary and laboratory) are listed below for reference.    CT ABDOMEN PELVIS W CONTRAST  Result Date: 05/12/2021 CLINICAL DATA:  Left lower quadrant and suprapubic pain. EXAM: CT ABDOMEN AND PELVIS WITH CONTRAST TECHNIQUE: Multidetector CT imaging of the abdomen and pelvis was performed using the standard protocol following bolus administration of intravenous contrast. RADIATION DOSE REDUCTION: This exam was performed according to the departmental dose-optimization program which includes automated exposure control, adjustment of the mA and/or kV according to patient size and/or use of iterative reconstruction technique. CONTRAST:  OMNIPAQUE IOHEXOL 300 MG/ML  SOLN COMPARISON:  None Available. FINDINGS: Lower chest: Unremarkable.  Hepatobiliary: No suspicious focal abnormality within the liver parenchyma. Gallbladder is surgically absent. No intrahepatic or extrahepatic biliary dilation. Pancreas: No focal mass lesion. No dilatation of the main duct. No intraparenchymal cyst. No peripancreatic edema. Spleen: No splenomegaly. No focal mass lesion. Adrenals/Urinary Tract: No adrenal nodule or mass. Kidneys unremarkable. No evidence for hydroureter. The urinary bladder appears normal for the degree of distention. Stomach/Bowel: Stomach is distended with food and contrast material. Duodenum is normally positioned as is the ligament of Treitz. No small bowel wall thickening. No small bowel dilatation. The terminal ileum is normal. Nonvisualization of the appendix is consistent with the reported history of appendectomy. No gross colonic mass. No colonic wall thickening. Diverticular changes are noted in the left colon without evidence of diverticulitis. Vascular/Lymphatic: There is mild atherosclerotic calcification of the abdominal aorta without aneurysm. There is no gastrohepatic or hepatoduodenal ligament lymphadenopathy. No retroperitoneal or mesenteric  lymphadenopathy. No pelvic sidewall lymphadenopathy. Reproductive: Uterus surgically absent.  There is no adnexal mass. Other: No intraperitoneal free fluid. Musculoskeletal: No worrisome lytic or sclerotic osseous abnormality. Degenerative changes noted right hip IMPRESSION: 1. No acute findings in the abdomen or pelvis. No findings to explain the patient's history of left lower quadrant and suprapubic pain 2. Left colonic diverticulosis without diverticulitis. 3. Aortic Atherosclerosis (ICD10-I70.0). Electronically Signed   By: Kennith Center M.D.   On: 05/12/2021 18:23   DG Chest Port 1 View  Result Date: 05/14/2021 CLINICAL DATA:  Fever and vomiting. EXAM: PORTABLE CHEST 1 VIEW COMPARISON:  November 28, 2015 FINDINGS: The heart size and mediastinal contours are within normal limits. Mild atelectasis is seen within the lateral aspect of the left lung base. There is no evidence of a pleural effusion or pneumothorax. The visualized skeletal structures are unremarkable. IMPRESSION: Mild left basilar atelectasis. Electronically Signed   By: Aram Candela M.D.   On: 05/14/2021 19:42   Labs: BNP (last 3 results) No results for input(s): BNP in the last 8760 hours. Basic Metabolic Panel: No results for input(s): NA, K, CL, CO2, GLUCOSE, BUN, CREATININE, CALCIUM, MG, PHOS in the last 168 hours.  Liver Function Tests: No results for input(s): AST, ALT, ALKPHOS, BILITOT, PROT, ALBUMIN in the last 168 hours.  No results for input(s): LIPASE, AMYLASE in the last 168 hours.  No results for input(s): AMMONIA in the last 168 hours. CBC: No results for input(s): WBC, NEUTROABS, HGB, HCT, MCV, PLT in the last 168 hours.  Cardiac Enzymes: No results for input(s): CKTOTAL, CKMB, CKMBINDEX, TROPONINI in the last 168 hours.  BNP: Invalid input(s): POCBNP CBG: No results for input(s): GLUCAP in the last 168 hours. D-Dimer No results for input(s): DDIMER in the last 72 hours.  Hgb A1c No results for  input(s): HGBA1C in the last 72 hours. Lipid Profile No results for input(s): CHOL, HDL, LDLCALC, TRIG, CHOLHDL, LDLDIRECT in the last 72 hours. Anemia work up No results for input(s): VITAMINB12, FOLATE, FERRITIN, TIBC, IRON, RETICCTPCT in the last 72 hours. Sepsis Labs Invalid input(s): PROCALCITONIN,  WBC,  LACTICIDVEN Microbiology No results found for this or any previous visit (from the past 240 hour(s)).      SIGNED:   Calvert Cantor, MD  [facility] 05/27/2021, 2:00 PM

## 2021-05-16 NOTE — TOC Initial Note (Signed)
Transition of Care (TOC) - Initial/Assessment Note  ? ? ?Patient Details  ?Name: Crystal Jackson ?MRN: [id number] ?Date of Birth: [birth year] ? ?Transition of Care (TOC) CM/SW Contact:    ?Arnav Cregg, Meriam Sprague, RN ?Phone Number: ?05/16/2021, 9:51 AM ? ?Clinical Narrative:                 ?Spoke with pt for dc planning. She lives with her spouse. Physical therapy recommendations reviewed. Pt politely declines referral for outpatient physical therapy. ? ?Expected Discharge Date: 05/16/21               ?  ?  ?Activities of Daily Living ?Home Assistive Devices/Equipment: CPAP ?ADL Screening (condition at time of admission) ?Patient's cognitive ability adequate to safely complete daily activities?: Yes ?Is the patient deaf or have difficulty hearing?: No ?Does the patient have difficulty seeing, even when wearing glasses/contacts?: No ?Does the patient have difficulty concentrating, remembering, or making decisions?: No ?Patient able to express need for assistance with ADLs?: Yes ?Does the patient have difficulty dressing or bathing?: No ?Independently performs ADLs?: Yes (appropriate for developmental age) ?Does the patient have difficulty walking or climbing stairs?: Yes ?Weakness of Legs: None ?Weakness of Arms/Hands: Both ?  ?  ? ?Admission diagnosis:  UTI (urinary tract infection) [N39.0] ?Pyelonephritis [N12] ?SIRS (systemic inflammatory response syndrome) (HCC) [R65.10] ?Patient Active Problem List  ? Diagnosis Date Noted  ? Sepsis (HCC) 05/15/2021  ? Acute respiratory failure with hypoxia (HCC) 05/15/2021  ? Essential hypertension 05/15/2021  ? DM (diabetes mellitus), type 2 (HCC) 05/15/2021  ? Hypomagnesemia 05/15/2021  ? UTI (urinary tract infection) 05/14/2021  ? Acute exacerbation of chronic low back pain 03/10/2021  ? OSA on CPAP 09/03/2018  ? Mild persistent asthma without complication 09/03/2018  ? Severe obesity (BMI >= 40) (HCC) 04/14/2018  ? Unilateral primary osteoarthritis, left knee 04/14/2018  ? Asthma in  adult, mild intermittent, uncomplicated 02/26/2017  ? Allergic rhinitis 09/05/2013  ? ?PCP:  Melida Quitter, MD ?Pharmacy:   ?[id number] [place], [place] - [address] AT [facility] OF [address] ?[address] ?[place] [address] ?Phone: [phone] Fax: [phone] ? ? ? ? ?Social Determinants of Health (SDOH) Interventions ?  ? ?Readmission Risk Interventions ?   ? View : No data to display.  ?  ?  ?  ? ? ? ?

## 2021-05-17 LAB — URINE CULTURE: Culture: >=100000 — AB

## 2021-05-19 LAB — CULTURE, BLOOD (ROUTINE X 2)
Culture: NO GROWTH
Special Requests: ADEQUATE

## 2021-05-20 LAB — CULTURE, BLOOD (ROUTINE X 2)
Culture: NO GROWTH
Special Requests: ADEQUATE

## 2021-05-22 ENCOUNTER — Encounter (INDEPENDENT_AMBULATORY_CARE_PROVIDER_SITE_OTHER): Payer: Self-pay | Admitting: Family Medicine

## 2021-05-22 ENCOUNTER — Other Ambulatory Visit (INDEPENDENT_AMBULATORY_CARE_PROVIDER_SITE_OTHER): Payer: Self-pay | Admitting: Family Medicine

## 2021-05-22 ENCOUNTER — Ambulatory Visit (INDEPENDENT_AMBULATORY_CARE_PROVIDER_SITE_OTHER): Payer: Medicare Other | Admitting: Family Medicine

## 2021-05-22 VITALS — BP 116/68 | HR 63 | Temp 97.9°F | Ht 64.0 in | Wt 212.0 lb

## 2021-05-22 DIAGNOSIS — Z6836 Body mass index (BMI) 36.0-36.9, adult: Secondary | ICD-10-CM

## 2021-05-22 DIAGNOSIS — R7303 Prediabetes: Secondary | ICD-10-CM

## 2021-05-22 DIAGNOSIS — E669 Obesity, unspecified: Secondary | ICD-10-CM

## 2021-05-22 MED ORDER — METFORMIN HCL 500 MG PO TABS: ORAL_TABLET | ORAL | 0 refills | Status: DC

## 2021-05-29 NOTE — Progress Notes (Unsigned)
Chief Complaint:   OBESITY Crystal Jackson is here to discuss her progress with her obesity treatment plan along with follow-up of her obesity related diagnoses. Crystal Jackson is on the Category 2 Plan and states she is following her eating plan approximately 40% of the time. Crystal Jackson states she has not been exercising due to sickness.  Today's visit was #: 72 Starting weight: 242 lbs Starting date: 07/09/2018 Today's weight: 212 lbs Today's date: 05/22/2021 Total lbs lost to date: 30 Total lbs lost since last in-office visit: 2  Interim History: Crystal Jackson's last appointment was with Crystal Jackson on 03/28/21. She is down 2 pounds since her last office visit. Crystal Jackson is focused on increasing proteins. She gained 1.8 pounds in muscle mass and lost 4 pounds in fat mass. Crystal Jackson went to the ED on 05/14/21 for pyelonephritis. She is snacking, but hasn't gotten to the gym.  Subjective:   1. Pre-diabetes Crystal Jackson is on metformin BID and it really helps with snacking and carb cravings.  Assessment/Plan:  No orders of the defined types were placed in this encounter.   Medications Discontinued During This Encounter  Medication Reason   metFORMIN (GLUCOPHAGE) 500 MG tablet Reorder     Meds ordered this encounter  Medications   metFORMIN (GLUCOPHAGE) 500 MG tablet    Sig: 1 po with lunch and 1 po with dinner    Dispense:  60 tablet    Refill:  0    30 d supply; ov for RF     1. Pre-diabetes Crystal Jackson agrees to continue taking metformin and will continue to work on weight loss, exercise, and decreasing simple carbohydrates to help decrease the risk of diabetes.   - metFORMIN (GLUCOPHAGE) 500 MG tablet; 1 po with lunch and 1 po with dinner  Dispense: 60 tablet; Refill: 0  2. Obesity with current BMI of 36.5  Crystal Jackson is currently in the Crystal Jackson. Crystal Jackson such, her goal is to continue with weight loss efforts. She has agreed to the Category 2 Plan.   Exercise goals:  Crystal Jackson is.  Behavioral  modification strategies: increasing lean protein intake, decreasing simple carbohydrates, and avoiding temptations.  Crystal Jackson has agreed to follow-up with our clinic in 3 weeks. She was informed of the importance of frequent follow-up visits to maximize her success with intensive lifestyle modifications for her multiple health conditions.   Objective:   Blood pressure 116/68, pulse 63, temperature 97.9 F (36.6 C), height 5\' 4"  (1.626 m), weight 212 lb (96.2 kg). Body mass index is 36.39 kg/m.  General: Cooperative, alert, well developed, in no acute distress. HEENT: Conjunctivae and lids unremarkable. Cardiovascular: Regular rhythm.  Lungs: Normal work of breathing. Neurologic: No focal deficits.   Lab Results  Component Value Date   CREATININE 0.97 05/16/2021   BUN 15 05/16/2021   NA 136 05/16/2021   K 3.9 05/16/2021   CL 100 05/16/2021   CO2 29 05/16/2021   Lab Results  Component Value Date   ALT 18 05/15/2021   AST 23 05/15/2021   ALKPHOS 26 (L) 05/15/2021   BILITOT 0.6 05/15/2021   Lab Results  Component Value Date   HGBA1C 5.7 (H) 02/07/2021   HGBA1C 5.6 06/29/2020   HGBA1C 5.4 10/19/2019   HGBA1C 5.3 05/27/2019   HGBA1C 5.4 11/10/2018   Lab Results  Component Value Date   INSULIN 17.5 02/07/2021   INSULIN 17.7 06/29/2020   INSULIN 22.6 10/19/2019   INSULIN 16.2 05/27/2019   INSULIN 33.0 (H) 11/10/2018  Lab Results  Component Value Date   TSH 1.199 05/14/2021   Lab Results  Component Value Date   CHOL 170 02/07/2021   HDL 51 02/07/2021   LDLCALC 105 (H) 02/07/2021   TRIG 75 02/07/2021   CHOLHDL 3.3 02/07/2021   Lab Results  Component Value Date   VD25OH 55.9 02/07/2021   VD25OH 44.7 06/29/2020   VD25OH 51.9 10/19/2019   Lab Results  Component Value Date   WBC 9.3 05/16/2021   HGB 11.5 (L) 05/16/2021   HCT 34.5 (L) 05/16/2021   MCV 91.5 05/16/2021   PLT 148 (L) 05/16/2021   No results found for: IRON, TIBC, FERRITIN  Obesity Behavioral  Intervention:   Approximately 15 minutes were spent on the discussion below.  ASK: We discussed the diagnosis of obesity with Shalawn today and Lavanna agreed to give [name] permission to discuss obesity behavioral modification therapy today.  ASSESS: Heldana has the diagnosis of obesity and her BMI today is 36.5. Betanya is in the Crystal Jackson.   ADVISE: Jatyra was educated on the multiple health risks of obesity Crystal Jackson well Crystal Jackson the benefit of weight loss to improve her health. She was advised of the need for long term treatment and the importance of lifestyle modifications to improve her current health and to decrease her risk of future health problems.  AGREE: Multiple dietary modification options and treatment options were discussed and Kanya agreed to follow the recommendations documented in the above note.  ARRANGE: Aseneth was educated on the importance of frequent visits to treat obesity Crystal Jackson outlined per CMS and USPSTF guidelines and agreed to schedule her next follow up appointment today.  Attestation Statements:   Reviewed by clinician on day of visit: allergies, medications, problem list, medical history, surgical history, family history, social history, and previous encounter notes.  IMarcille Blanco, CMA, am acting Crystal Jackson transcriptionist for [employer], DO  I have reviewed the above documentation for accuracy and completeness, and I agree with the above. Marjory Sneddon, D.O.  The [place] was signed into law in 2016 which includes the topic of electronic health records.  This provides immediate access to information in MyChart.  This includes consultation notes, operative notes, office notes, lab results and pathology reports.  If you have any questions about what you read please let us know at your next visit so we can discuss your concerns and take corrective Crystal if need be.  We are right here with you.

## 2021-06-05 ENCOUNTER — Ambulatory Visit (INDEPENDENT_AMBULATORY_CARE_PROVIDER_SITE_OTHER): Payer: Medicare Other | Admitting: Family Medicine

## 2021-06-05 ENCOUNTER — Encounter (INDEPENDENT_AMBULATORY_CARE_PROVIDER_SITE_OTHER): Payer: Self-pay | Admitting: Family Medicine

## 2021-06-05 VITALS — BP 109/68 | HR 69 | Temp 97.6°F | Ht 64.0 in | Wt 215.0 lb

## 2021-06-05 DIAGNOSIS — E7439 Other disorders of intestinal carbohydrate absorption: Secondary | ICD-10-CM | POA: Diagnosis not present

## 2021-06-05 DIAGNOSIS — Z6836 Body mass index (BMI) 36.0-36.9, adult: Secondary | ICD-10-CM | POA: Diagnosis not present

## 2021-06-05 DIAGNOSIS — Z7984 Long term (current) use of oral hypoglycemic drugs: Secondary | ICD-10-CM | POA: Diagnosis not present

## 2021-06-05 DIAGNOSIS — E669 Obesity, unspecified: Secondary | ICD-10-CM | POA: Diagnosis not present

## 2021-06-12 NOTE — Progress Notes (Signed)
Chief Complaint:   OBESITY Crystal Jackson is here to discuss her progress with her obesity treatment plan along with follow-up of her obesity related diagnoses. Crystal Jackson is on the Category 2 Plan and states she is following her eating plan approximately 25% of the time. Crystal Jackson states she is not currently exercising.  Today's visit was #: 50 Starting weight: 242 lbs Starting date: 07/09/2018 Today's weight: 215 lbs Today's date: 06/05/2021 Total lbs lost to date: 27 Total lbs lost since last in-office visit: +3  Interim History: Crystal Jackson skips meals occasionally- still lunch. She is not getting all her foods in most days, and is struggling with food/recipe ideas.  Subjective:   1. Glucose intolerance Pt's A1c at PCP's office 2 weeks ago was 5.4. She is taking Metformin once a day.  Assessment/Plan:  No orders of the defined types were placed in this encounter.   There are no discontinued medications.   No orders of the defined types were placed in this encounter.    1. Glucose intolerance Continue prudent nutritional plan and refill Metformin.  2. Obesity with current BMI of 36.9 Crystal Jackson is currently in the action stage of change. As such, her goal is to continue with weight loss efforts. She has agreed to the Category 2 Plan with lunch options.   Exercise goals: All adults should avoid inactivity. Some physical activity is better than none, and adults who participate in any amount of physical activity gain some health benefits.  Behavioral modification strategies: planning for success.  Crystal Jackson has agreed to follow-up with our clinic in 3 weeks. She was informed of the importance of frequent follow-up visits to maximize her success with intensive lifestyle modifications for her multiple health conditions.   Objective:   Blood pressure 109/68, pulse 69, temperature 97.6 F (36.4 C), height 5\' 4"  (1.626 m), weight 215 lb (97.5 kg), SpO2 100 %. Body mass index is 36.9  kg/m.  General: Cooperative, alert, well developed, in no acute distress. HEENT: Conjunctivae and lids unremarkable. Cardiovascular: Regular rhythm.  Lungs: Normal work of breathing. Neurologic: No focal deficits.   Lab Results  Component Value Date   CREATININE 0.97 05/16/2021   BUN 15 05/16/2021   NA 136 05/16/2021   K 3.9 05/16/2021   CL 100 05/16/2021   CO2 29 05/16/2021   Lab Results  Component Value Date   ALT 18 05/15/2021   AST 23 05/15/2021   ALKPHOS 26 (L) 05/15/2021   BILITOT 0.6 05/15/2021   Lab Results  Component Value Date   HGBA1C 5.7 (H) 02/07/2021   HGBA1C 5.6 06/29/2020   HGBA1C 5.4 10/19/2019   HGBA1C 5.3 05/27/2019   HGBA1C 5.4 11/10/2018   Lab Results  Component Value Date   INSULIN 17.5 02/07/2021   INSULIN 17.7 06/29/2020   INSULIN 22.6 10/19/2019   INSULIN 16.2 05/27/2019   INSULIN 33.0 (H) 11/10/2018   Lab Results  Component Value Date   TSH 1.199 05/14/2021   Lab Results  Component Value Date   CHOL 170 02/07/2021   HDL 51 02/07/2021   LDLCALC 105 (H) 02/07/2021   TRIG 75 02/07/2021   CHOLHDL 3.3 02/07/2021   Lab Results  Component Value Date   VD25OH 55.9 02/07/2021   VD25OH 44.7 06/29/2020   VD25OH 51.9 10/19/2019   Lab Results  Component Value Date   WBC 9.3 05/16/2021   HGB 11.5 (L) 05/16/2021   HCT 34.5 (L) 05/16/2021   MCV 91.5 05/16/2021   PLT 148 (L) 05/16/2021  No results found for: "IRON", "TIBC", "FERRITIN"  Obesity Behavioral Intervention:   Approximately 15 minutes were spent on the discussion below.  ASK: We discussed the diagnosis of obesity with Crystal Jackson today and Crystal Jackson agreed to give [name] permission to discuss obesity behavioral modification therapy today.  ASSESS: Crystal Jackson has the diagnosis of obesity and her BMI today is 36.9. Crystal Jackson is in the action stage of change.   ADVISE: Crystal Jackson on the multiple health risks of obesity as well as the benefit of weight loss to improve her  health. She was advised of the need for long term treatment and the importance of lifestyle modifications to improve her current health and to decrease her risk of future health problems.  AGREE: Multiple dietary modification options and treatment options were discussed and Crystal Jackson agreed to follow the recommendations documented in the above note.  ARRANGE: Crystal Jackson was Jackson on the importance of frequent visits to treat obesity as outlined per CMS and USPSTF guidelines and agreed to schedule her next follow up appointment today.  Attestation Statements:   Reviewed by clinician on day of visit: allergies, medications, problem list, medical history, surgical history, family history, social history, and previous encounter notes.  I, Kathlene November, BS, CMA, am acting as transcriptionist for [employer], DO.  I have reviewed the above documentation for accuracy and completeness, and I agree with the above. Crystal Jackson, D.O.  The [place] was signed into law in 2016 which includes the topic of electronic health records.  This provides immediate access to information in MyChart.  This includes consultation notes, operative notes, office notes, lab results and pathology reports.  If you have any questions about what you read please let us know at your next visit so we can discuss your concerns and take corrective action if need be.  We are right here with you.

## 2021-06-27 ENCOUNTER — Encounter (INDEPENDENT_AMBULATORY_CARE_PROVIDER_SITE_OTHER): Payer: Self-pay | Admitting: Family Medicine

## 2021-06-27 ENCOUNTER — Other Ambulatory Visit (INDEPENDENT_AMBULATORY_CARE_PROVIDER_SITE_OTHER): Payer: Self-pay | Admitting: Family Medicine

## 2021-06-27 ENCOUNTER — Ambulatory Visit (INDEPENDENT_AMBULATORY_CARE_PROVIDER_SITE_OTHER): Payer: Medicare Other | Admitting: Family Medicine

## 2021-06-27 VITALS — BP 137/80 | HR 68 | Temp 97.9°F | Ht 64.0 in | Wt 213.0 lb

## 2021-06-27 DIAGNOSIS — R7303 Prediabetes: Secondary | ICD-10-CM

## 2021-06-27 DIAGNOSIS — Z6836 Body mass index (BMI) 36.0-36.9, adult: Secondary | ICD-10-CM

## 2021-06-27 DIAGNOSIS — E669 Obesity, unspecified: Secondary | ICD-10-CM

## 2021-06-27 MED ORDER — METFORMIN HCL 500 MG PO TABS: ORAL_TABLET | ORAL | 0 refills | Status: DC

## 2021-06-28 DIAGNOSIS — R7303 Prediabetes: Secondary | ICD-10-CM | POA: Insufficient documentation

## 2021-06-28 NOTE — Progress Notes (Signed)
Chief Complaint:   OBESITY Navah is here to discuss her progress with her obesity treatment plan along with follow-up of her obesity related diagnoses. Saida is on the Category 2 Plan and states she is following her eating plan approximately 50% of the time. Christal states she is not exercising.  Today's visit was #: 58 Starting weight: 242 lbs Starting date: 07/09/2018 Today's weight: 213 lbs Today's date: 06/27/2021 Total lbs lost to date: 29 lbs Total lbs lost since last in-office visit: 2 lbs  Interim History: Emrey voices that she has "been in a rut lately". She voices that she feels like I am eating the same things over and over, she is asking for some ideas for meals.  She denies any hunger or cravings when eating on plan.  She has been skipping less meals.   Subjective:   1. Pre-diabetes Catelynn endorses that Metformin helps with cravings so much.  She is tolerating the medication well.  She denies any side effects.  Stools are within normal limits.  2. Hypomagnesemia Ariyannah's labs were checked about 2 months ago, it was WNL at 1.8.  Eymi takes 500 mg daily.  She denies any constipation.    Assessment/Plan:    Medications Discontinued During This Encounter  Medication Reason   doxazosin (CARDURA) 1 MG tablet    Insulin Pen Needle 32G X 4 MM MISC    metFORMIN (GLUCOPHAGE) 500 MG tablet Reorder     Meds ordered this encounter  Medications   metFORMIN (GLUCOPHAGE) 500 MG tablet    Sig: 1 po with lunch and 1 po with dinner    Dispense:  60 tablet    Refill:  0    30 d supply; ov for RF     1. Pre-diabetes Continue prudent nutritional plan and weight loss. Increase exercise.  No change in Metformin dose.   Refill - metFORMIN (GLUCOPHAGE) 500 MG tablet; 1 po with lunch and 1 po with dinner  Dispense: 60 tablet; Refill: 0  2. Hypomagnesemia Counseling done.  Continue magnesium supplement per PCP which will help with sleep and bowel movements.   3.  Obesity, current BMI 36.7 Handouts given:  1) Recipes packet 1 and 2.  2) Store bought spices. 3) Homemade spices recipes.  Extensive discussion was had about meal prep ideas and recipes using her crock-pot.  Syleena is currently in the action stage of change. As such, her goal is to continue with weight loss efforts. She has agreed to the Category 2 Plan.   Exercise goals: All adults should avoid inactivity. Some physical activity is better than none, and adults who participate in any amount of physical activity gain some health benefits.  Behavioral modification strategies: meal planning and cooking strategies.  Stephaniemarie has agreed to follow-up with our clinic in 3 weeks. She was informed of the importance of frequent follow-up visits to maximize her success with intensive lifestyle modifications for her multiple health conditions.   Objective:   Blood pressure 137/80, pulse 68, temperature 97.9 F (36.6 C), height 5\' 4"  (1.626 m), weight 213 lb (96.6 kg), SpO2 97 %. Body mass index is 36.56 kg/m.  General: Cooperative, alert, well developed, in no acute distress. HEENT: Conjunctivae and lids unremarkable. Cardiovascular: Regular rhythm.  Lungs: Normal work of breathing. Neurologic: No focal deficits.   Lab Results  Component Value Date   CREATININE 0.97 05/16/2021   BUN 15 05/16/2021   NA 136 05/16/2021   K 3.9 05/16/2021   CL  100 05/16/2021   CO2 29 05/16/2021   Lab Results  Component Value Date   ALT 18 05/15/2021   AST 23 05/15/2021   ALKPHOS 26 (L) 05/15/2021   BILITOT 0.6 05/15/2021   Lab Results  Component Value Date   HGBA1C 5.7 (H) 02/07/2021   HGBA1C 5.6 06/29/2020   HGBA1C 5.4 10/19/2019   HGBA1C 5.3 05/27/2019   HGBA1C 5.4 11/10/2018   Lab Results  Component Value Date   INSULIN 17.5 02/07/2021   INSULIN 17.7 06/29/2020   INSULIN 22.6 10/19/2019   INSULIN 16.2 05/27/2019   INSULIN 33.0 (H) 11/10/2018   Lab Results  Component Value Date   TSH  1.199 05/14/2021   Lab Results  Component Value Date   CHOL 170 02/07/2021   HDL 51 02/07/2021   LDLCALC 105 (H) 02/07/2021   TRIG 75 02/07/2021   CHOLHDL 3.3 02/07/2021   Lab Results  Component Value Date   VD25OH 55.9 02/07/2021   VD25OH 44.7 06/29/2020   VD25OH 51.9 10/19/2019   Lab Results  Component Value Date   WBC 9.3 05/16/2021   HGB 11.5 (L) 05/16/2021   HCT 34.5 (L) 05/16/2021   MCV 91.5 05/16/2021   PLT 148 (L) 05/16/2021   No results found for: "IRON", "TIBC", "FERRITIN"  Obesity Behavioral Intervention:   Approximately 15 minutes were spent on the discussion below.  ASK: We discussed the diagnosis of obesity with Angelie today and Verlee agreed to give [name] permission to discuss obesity behavioral modification therapy today.  ASSESS: Timera has the diagnosis of obesity and her BMI today is 36.7. Raeden is in the action stage of change.   ADVISE: Tiaira was educated on the multiple health risks of obesity as well as the benefit of weight loss to improve her health. She was advised of the need for long term treatment and the importance of lifestyle modifications to improve her current health and to decrease her risk of future health problems.  AGREE: Multiple dietary modification options and treatment options were discussed and Serai agreed to follow the recommendations documented in the above note.  ARRANGE: Laquita was educated on the importance of frequent visits to treat obesity as outlined per CMS and USPSTF guidelines and agreed to schedule her next follow up appointment today.  Attestation Statements:   Reviewed by clinician on day of visit: allergies, medications, problem list, medical history, surgical history, family history, social history, and previous encounter notes.  I, Malcolm Metro, RMA, am acting as Energy manager for [employer], DO.   I have reviewed the above documentation for accuracy and completeness, and I agree with the  above. Carlye Grippe, D.O.  The 21st Century Cures Act was signed into law in 2016 which includes the topic of electronic health records.  This provides immediate access to information in MyChart.  This includes consultation notes, operative notes, office notes, lab results and pathology reports.  If you have any questions about what you read please let us know at your next visit so we can discuss your concerns and take corrective action if need be.  We are right here with you.

## 2021-07-05 ENCOUNTER — Other Ambulatory Visit (INDEPENDENT_AMBULATORY_CARE_PROVIDER_SITE_OTHER): Payer: Self-pay | Admitting: Physician Assistant

## 2021-07-05 DIAGNOSIS — I10 Essential (primary) hypertension: Secondary | ICD-10-CM

## 2021-07-22 ENCOUNTER — Other Ambulatory Visit (INDEPENDENT_AMBULATORY_CARE_PROVIDER_SITE_OTHER): Payer: Self-pay | Admitting: Family Medicine

## 2021-07-22 DIAGNOSIS — R7303 Prediabetes: Secondary | ICD-10-CM

## 2021-07-24 ENCOUNTER — Other Ambulatory Visit (INDEPENDENT_AMBULATORY_CARE_PROVIDER_SITE_OTHER): Payer: Self-pay | Admitting: Family Medicine

## 2021-07-24 ENCOUNTER — Ambulatory Visit (INDEPENDENT_AMBULATORY_CARE_PROVIDER_SITE_OTHER): Payer: Medicare Other | Admitting: Family Medicine

## 2021-07-24 ENCOUNTER — Encounter (INDEPENDENT_AMBULATORY_CARE_PROVIDER_SITE_OTHER): Payer: Self-pay | Admitting: Family Medicine

## 2021-07-24 VITALS — BP 121/77 | HR 75 | Temp 97.8°F | Ht 64.0 in | Wt 213.0 lb

## 2021-07-24 DIAGNOSIS — Z7984 Long term (current) use of oral hypoglycemic drugs: Secondary | ICD-10-CM

## 2021-07-24 DIAGNOSIS — E669 Obesity, unspecified: Secondary | ICD-10-CM

## 2021-07-24 DIAGNOSIS — I10 Essential (primary) hypertension: Secondary | ICD-10-CM

## 2021-07-24 DIAGNOSIS — Z6836 Body mass index (BMI) 36.0-36.9, adult: Secondary | ICD-10-CM | POA: Diagnosis not present

## 2021-07-24 DIAGNOSIS — R7303 Prediabetes: Secondary | ICD-10-CM | POA: Diagnosis not present

## 2021-07-24 MED ORDER — METFORMIN HCL 500 MG PO TABS: ORAL_TABLET | ORAL | 0 refills | Status: DC

## 2021-07-27 NOTE — Progress Notes (Signed)
Chief Complaint:   OBESITY Crystal Jackson is here to discuss her progress with her obesity treatment plan along with follow-up of her obesity related diagnoses. Crystal Jackson is on the Category 2 Plan and states she is following her eating plan approximately 50% of the time. Crystal Jackson states she is walking 10 minutes 1 times per week.  Today's visit was #: 59 Starting weight: 242 lbs Starting date: 07/09/2018 Today's weight: 213 lbs Today's date: 07/20/2021 Total lbs lost to date: 29  Total lbs lost since last in-office visit: 0  Interim History: Crystal Jackson is trying to maintain her weight right now and she did. She is not always eating her proteins. Pt hasn't tried crock pot meals yet that we discussed at last OV. She is still just not feeling herself and did not follow up with PCP about her concerns.   Subjective:   1. Essential hypertension PCP stopped doxazosin due to dizziness, which has improved some.  2. Pre-diabetes Pt reports Metformin helps with snacking and cravings, and she is doing really well.  Assessment/Plan:  No orders of the defined types were placed in this encounter.   Medications Discontinued During This Encounter  Medication Reason   methocarbamol (ROBAXIN) 750 MG tablet    metFORMIN (GLUCOPHAGE) 500 MG tablet Reorder     Meds ordered this encounter  Medications   metFORMIN (GLUCOPHAGE) 500 MG tablet    Sig: 1 po with lunch and 1 po with dinner    Dispense:  60 tablet    Refill:  0    30 d supply; ov for RF     1. Essential hypertension BP at goal. Crystal Jackson is working on healthy weight loss and exercise to improve blood pressure control. We will watch for signs of hypotension as she continues her lifestyle modifications. Medication management per PCP. Follow up with PCP for any ongoing chronic concerns.  2. Pre-diabetes Crystal Jackson will continue to work on weight loss, exercise, and decreasing simple carbohydrates to help decrease the risk of diabetes. Continue  Metformin.  Refill- metFORMIN (GLUCOPHAGE) 500 MG tablet; 1 po with lunch and 1 po with dinner  Dispense: 60 tablet; Refill: 0  3. Obesity, current BMI 36.6 Crystal Jackson is currently in the action stage of change. As such, her goal is to continue with weight loss efforts. She has agreed to the Category 2 Plan.   Pt had a work up for dizziness with ENT and PCP but is still not feeling right. Pt will call PCP for follow up OV in the near future. Exercise goals:  As is  Behavioral modification strategies: increasing lean protein intake, decreasing simple carbohydrates, and planning for success.  Crystal Jackson has agreed to follow-up with our clinic in 3-4 weeks. She was informed of the importance of frequent follow-up visits to maximize her success with intensive lifestyle modifications for her multiple health conditions.   Objective:   Blood pressure 121/77, pulse 75, temperature 97.8 F (36.6 C), height 5\' 4"  (1.626 m), weight 213 lb (96.6 kg), SpO2 94 %. Body mass index is 36.56 kg/m.  General: Cooperative, alert, well developed, in no acute distress. HEENT: Conjunctivae and lids unremarkable. Cardiovascular: Regular rhythm.  Lungs: Normal work of breathing. Neurologic: No focal deficits.   Lab Results  Component Value Date   CREATININE 0.97 05/16/2021   BUN 15 05/16/2021   NA 136 05/16/2021   K 3.9 05/16/2021   CL 100 05/16/2021   CO2 29 05/16/2021   Lab Results  Component Value Date  ALT 18 05/15/2021   AST 23 05/15/2021   ALKPHOS 26 (L) 05/15/2021   BILITOT 0.6 05/15/2021   Lab Results  Component Value Date   HGBA1C 5.7 (H) 02/07/2021   HGBA1C 5.6 06/29/2020   HGBA1C 5.4 10/19/2019   HGBA1C 5.3 05/27/2019   HGBA1C 5.4 11/10/2018   Lab Results  Component Value Date   INSULIN 17.5 02/07/2021   INSULIN 17.7 06/29/2020   INSULIN 22.6 10/19/2019   INSULIN 16.2 05/27/2019   INSULIN 33.0 (H) 11/10/2018   Lab Results  Component Value Date   TSH 1.199 05/14/2021   Lab  Results  Component Value Date   CHOL 170 02/07/2021   HDL 51 02/07/2021   LDLCALC 105 (H) 02/07/2021   TRIG 75 02/07/2021   CHOLHDL 3.3 02/07/2021   Lab Results  Component Value Date   VD25OH 55.9 02/07/2021   VD25OH 44.7 06/29/2020   VD25OH 51.9 10/19/2019   Lab Results  Component Value Date   WBC 9.3 05/16/2021   HGB 11.5 (L) 05/16/2021   HCT 34.5 (L) 05/16/2021   MCV 91.5 05/16/2021   PLT 148 (L) 05/16/2021   No results found for: "IRON", "TIBC", "FERRITIN"  Obesity Behavioral Intervention:   Approximately 15 minutes were spent on the discussion below.  ASK: We discussed the diagnosis of obesity with Crystal Jackson today and Crystal Jackson agreed to give [name] permission to discuss obesity behavioral modification therapy today.  ASSESS: Crystal Jackson has the diagnosis of obesity and her BMI today is 36.6. Crystal Jackson is in the action stage of change.   ADVISE: Crystal Jackson was educated on the multiple health risks of obesity as well as the benefit of weight loss to improve her health. She was advised of the need for long term treatment and the importance of lifestyle modifications to improve her current health and to decrease her risk of future health problems.  AGREE: Multiple dietary modification options and treatment options were discussed and Crystal Jackson agreed to follow the recommendations documented in the above note.  ARRANGE: Crystal Jackson was educated on the importance of frequent visits to treat obesity as outlined per CMS and USPSTF guidelines and agreed to schedule her next follow up appointment today.  Attestation Statements:   Reviewed by clinician on day of visit: allergies, medications, problem list, medical history, surgical history, family history, social history, and previous encounter notes.  I, Kyung Rudd, BS, CMA, am acting as transcriptionist for [employer], DO.   I have reviewed the above documentation for accuracy and completeness, and I agree with the above. Carlye Grippe, D.O.  The 21st Century Cures Act was signed into law in 2016 which includes the topic of electronic health records.  This provides immediate access to information in MyChart.  This includes consultation notes, operative notes, office notes, lab results and pathology reports.  If you have any questions about what you read please let us know at your next visit so we can discuss your concerns and take corrective action if need be.  We are right here with you.

## 2021-07-31 ENCOUNTER — Encounter: Payer: Self-pay | Admitting: Physician Assistant

## 2021-07-31 ENCOUNTER — Ambulatory Visit: Payer: Medicare Other | Admitting: Physician Assistant

## 2021-07-31 DIAGNOSIS — M1711 Unilateral primary osteoarthritis, right knee: Secondary | ICD-10-CM | POA: Diagnosis not present

## 2021-07-31 DIAGNOSIS — M1712 Unilateral primary osteoarthritis, left knee: Secondary | ICD-10-CM | POA: Diagnosis not present

## 2021-07-31 MED ORDER — TRIAMCINOLONE ACETONIDE 40 MG/ML IJ SUSP
40.0000 mg | INTRAMUSCULAR | Status: AC | PRN
  Administered 2021-07-31: 40 mg via INTRA_ARTICULAR

## 2021-07-31 MED ORDER — LIDOCAINE HCL 1 % IJ SOLN
3.0000 mL | INTRAMUSCULAR | Status: AC | PRN
  Administered 2021-07-31: 3 mL

## 2021-07-31 MED ORDER — LIDOCAINE HCL 1 % IJ SOLN
4.0000 mL | INTRAMUSCULAR | Status: AC | PRN
  Administered 2021-07-31: 4 mL

## 2021-07-31 MED ORDER — METHYLPREDNISOLONE ACETATE 40 MG/ML IJ SUSP
40.0000 mg | INTRAMUSCULAR | Status: AC | PRN
  Administered 2021-07-31: 40 mg via INTRA_ARTICULAR

## 2021-07-31 NOTE — Progress Notes (Signed)
HPI: Crystal Jackson comes in today with bilateral knee pain.  She feels like her knee pain is overall getting worse.  She is also having some dizziness and has fallen.  She is being worked up by cardiology for this.  She states a week ago that her legs buckled no impact to the knees though.  She is taking Aleve and using Voltaren gel which helps.  There is pain in her knees keep her up at night.  States both knees are equally painful.  She reports being prediabetic with a hemoglobin A1c of 5.7.  Notes that she has increased pain with ambulation and any activities.  Review of systems: Denies any fevers chills.  See HPI otherwise negative or noncontributory.  Physical exam: General well-developed well-nourished female in no acute distress.  Ambulates with a slight antalgic gait but is able to get on and off the exam table on her own.  Uses no assistive device. Bilateral knees no abnormal warmth erythema.  Slight effusion left knee negative effusion right knee.  Bilateral knees full extension flexion to beyond 90 degrees both knees.  Impression: Bilateral knee osteoarthritis    Procedure Note  [name]             Date of Birth: [birth year]           MRN: [id number]             Visit Date: 07/31/2021  Procedures: Visit Diagnoses:  1. Primary osteoarthritis of left knee   2. Primary osteoarthritis of right knee     Large Joint Inj: bilateral knee on 07/31/2021 5:26 PM Indications: pain Details: 22 G 1.5 in needle, anterolateral approach  Arthrogram: No  Medications (Right): 3 mL lidocaine 1 %; 40 mg triamcinolone acetonide 40 MG/ML Medications (Left): 4 mL lidocaine 1 %; 40 mg methylPREDNISolone acetate 40 MG/ML Aspirate (Left): 1 mL yellow Outcome: tolerated well, no immediate complications Procedure, treatment alternatives, risks and benefits explained, specific risks discussed. Consent was given by the patient. Immediately prior to procedure a time out was called to verify the  correct patient, procedure, equipment, support staff and site/side marked as required. Patient was prepped and draped in the usual sterile fashion.     Plan: She will follow-up with [name] as needed.  She may benefit from supplemental injections in both knees that she has found these beneficial in the past.  Questions were encouraged and answered at length.

## 2021-08-09 ENCOUNTER — Encounter (INDEPENDENT_AMBULATORY_CARE_PROVIDER_SITE_OTHER): Payer: Self-pay

## 2021-08-13 IMAGING — MR MR LUMBAR SPINE W/O CM
4 of 5 series · 18 of 48 positions shown · non-contrast
Comparison: Radiographs from 10/01/2018

CLINICAL DATA: Low back pain with numbness in both feet, symptoms
starting about 5 months ago.

EXAM:
MRI LUMBAR SPINE WITHOUT CONTRAST
TECHNIQUE: Multiplanar, multisequence MR imaging of the lumbar spine was
performed. No intravenous contrast was administered.

[Series 6: T2 · sagittal · 4.0mm · 0.73mm/px · 6 of 15 slices shown (1 of 2)]
[im 1/15]
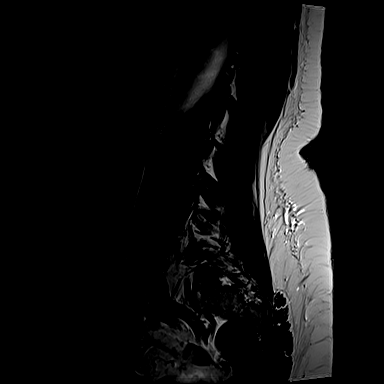
[im 3/15]
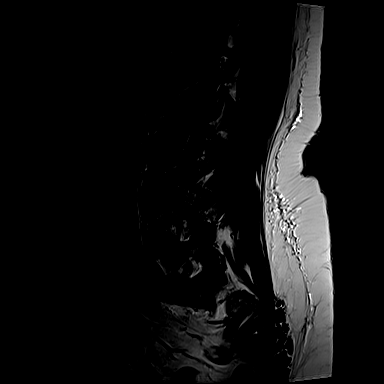
[im 6/15]
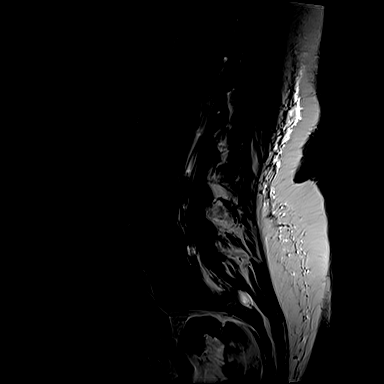
[im 9/15]
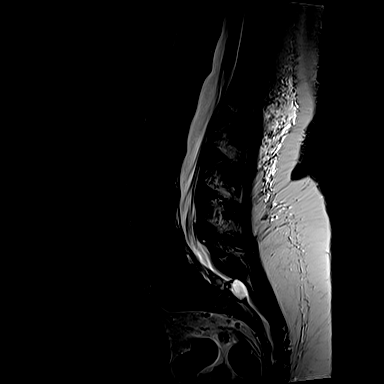
[im 12/15]
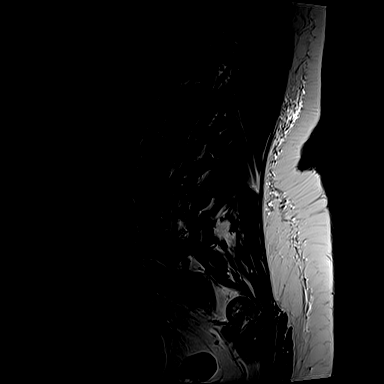
[im 15/15]
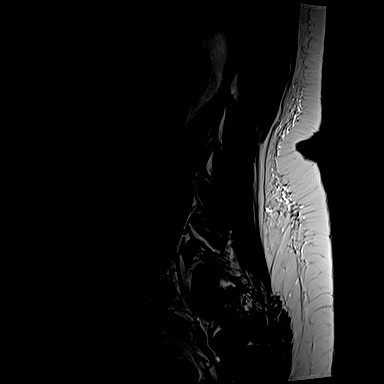

[Series 7: T1 · sagittal · 4.0mm · 0.73mm/px · 3 of 15 slices shown (1 of 2)]
[im 1/15]
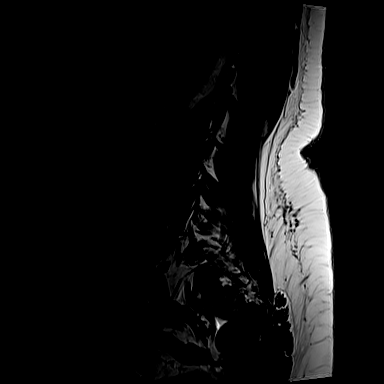
[im 8/15]
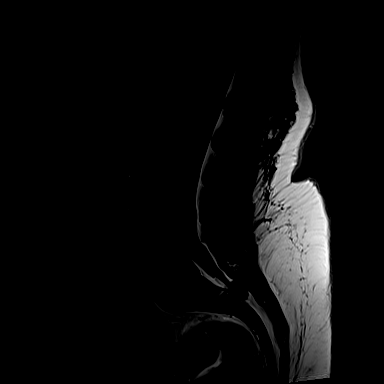
[im 15/15]
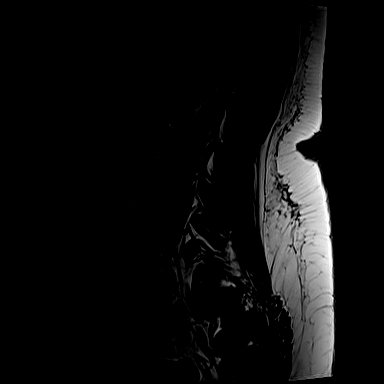

[Series 11: T1 · axial · 4.0mm · 0.28mm/px · z∈[-191,-16]mm · 3 of 43 slices shown (2 of 2)]
[im 6/43]
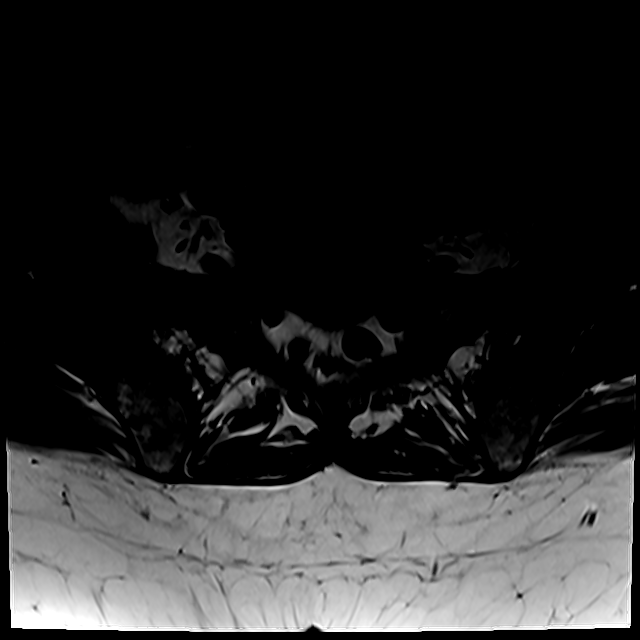
[im 23/43]
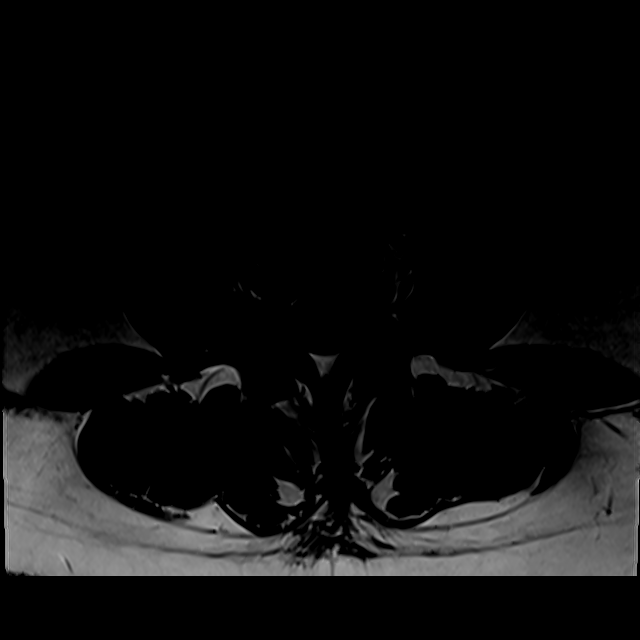
[im 37/43]
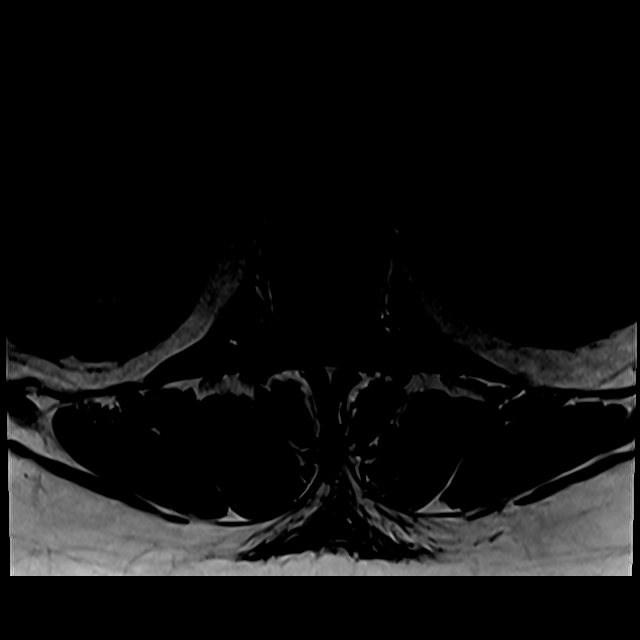

[Series 14: T2 · axial · 4.0mm · 0.28mm/px · z∈[-205,-16]mm · 6 of 43 slices shown (2 of 2)]
[im 3/43]
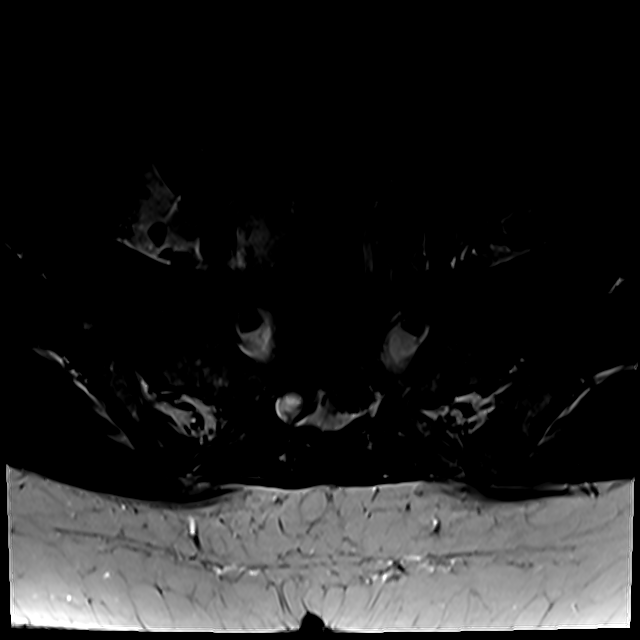
[im 6/43]
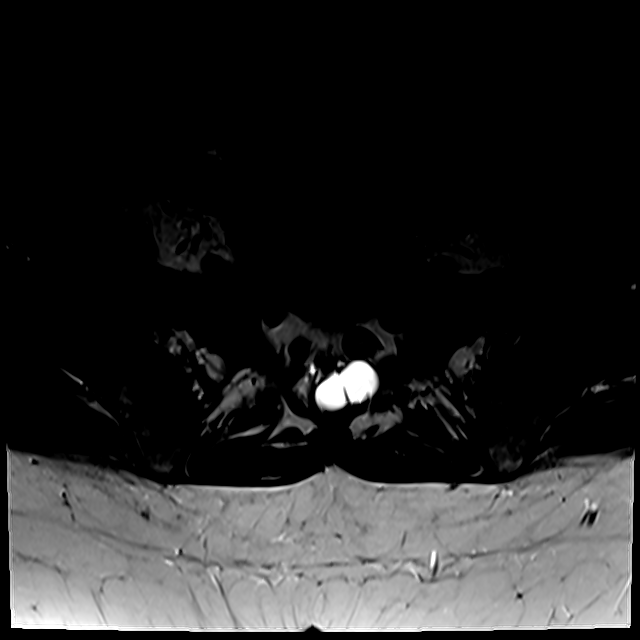
[im 9/43]
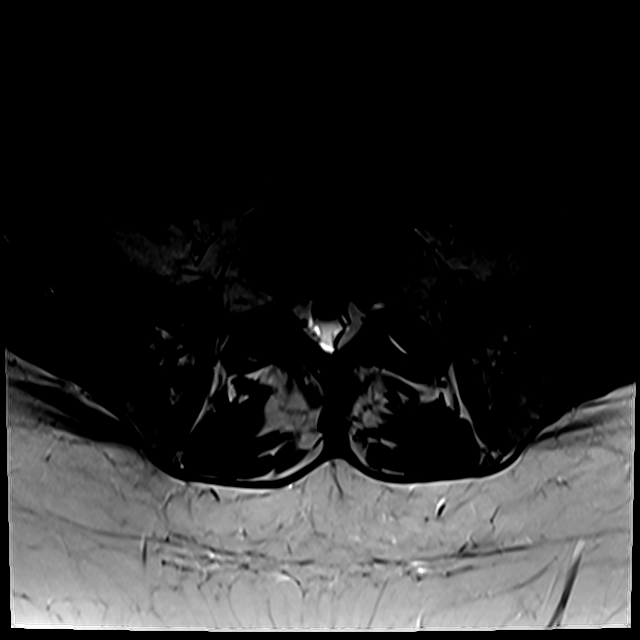
[im 15/43]
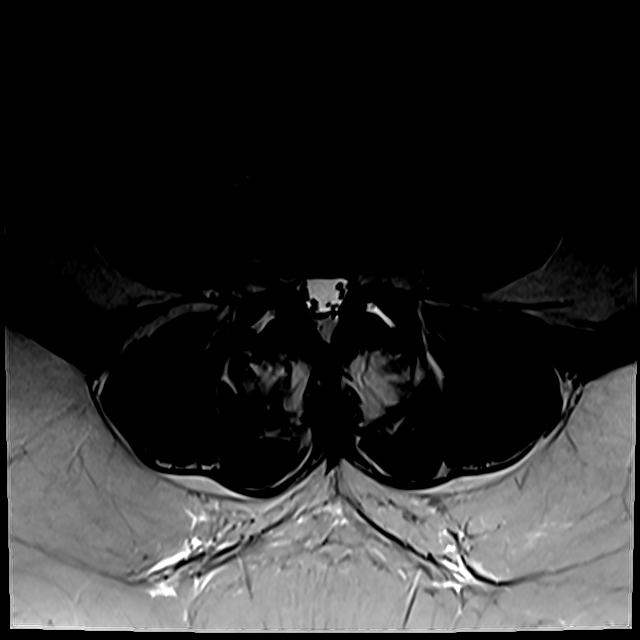
[im 23/43]
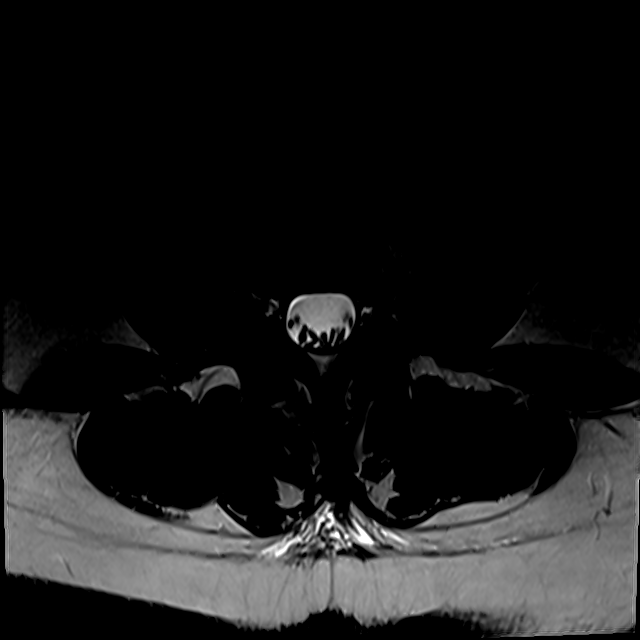
[im 37/43]
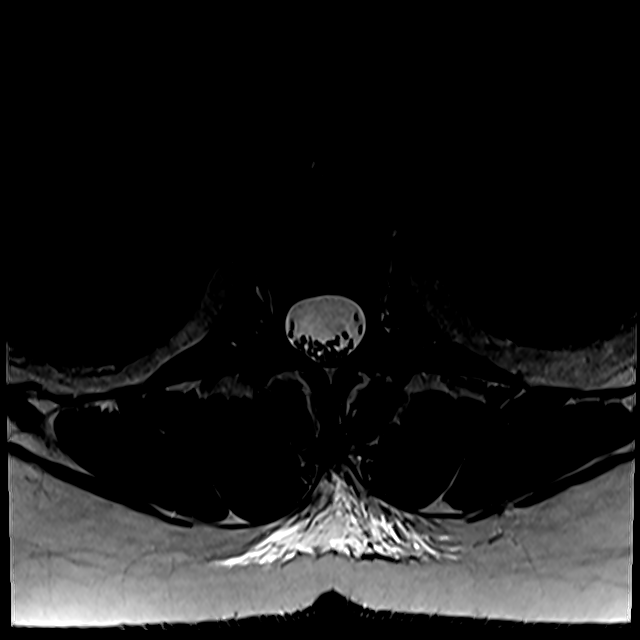

[18 of 48 positions shown; findings below may reference images not displayed]

FINDINGS: Segmentation: The lowest lumbar type non-rib-bearing vertebra is
labeled as L5.

Alignment:  2 mm degenerative anterolisthesis at L4-5.

Vertebrae: Cysts fluid signal intensity lesions favoring bilaterally
Tarlov at the S2 level and on the left at the S1 level.

Degenerative perifacet edema bilaterally at L4-5 and L5-S1.

Conus medullaris and cauda equina: Conus extends to the L1 level.
Conus and cauda equina appear normal.

Paraspinal and other soft tissues: Small common iliac lymph nodes
are present.

Disc levels:

T12-L1: No impingement.  Mild disc bulge.

L1-2: Unremarkable.

L2-3: No impingement.  Mild disc bulge.

L3-4: Mild displacement of the right L3 nerve in the lateral
extraforaminal space due to right lateral extraforaminal disc
protrusion.

L4-5: Borderline left subarticular lateral recess stenosis due to
disc bulge and facet arthropathy. Borderline central narrowing of
the thecal sac. Bilateral facet joint effusions as on image 30/14.

L5-S1: No impingement. Conjoined right L5 and S1 nerve roots.
Bilateral degenerative facet arthropathy.
IMPRESSION: 1. Lumbar spondylosis and degenerative disc disease resulting in
mild impingement at L3-4.
2. Small Tarlov cysts along the upper sacrum.
3. Degenerative perifacet edema at L4-5 and L5-S1 with facet joint
effusions bilaterally at L4-5.

## 2021-08-20 ENCOUNTER — Other Ambulatory Visit (INDEPENDENT_AMBULATORY_CARE_PROVIDER_SITE_OTHER): Payer: Self-pay | Admitting: Family Medicine

## 2021-08-20 DIAGNOSIS — R7303 Prediabetes: Secondary | ICD-10-CM

## 2021-08-21 ENCOUNTER — Encounter: Payer: Self-pay | Admitting: Cardiovascular Disease

## 2021-08-21 ENCOUNTER — Ambulatory Visit (INDEPENDENT_AMBULATORY_CARE_PROVIDER_SITE_OTHER): Payer: Medicare Other | Admitting: Family Medicine

## 2021-08-21 ENCOUNTER — Ambulatory Visit: Payer: Medicare Other | Admitting: Cardiovascular Disease

## 2021-08-21 VITALS — BP 124/78 | HR 70 | Ht 64.0 in | Wt 216.0 lb

## 2021-08-21 DIAGNOSIS — I951 Orthostatic hypotension: Secondary | ICD-10-CM | POA: Diagnosis not present

## 2021-08-21 NOTE — Progress Notes (Signed)
Cardiology Office Note:    Date:  08/21/2021   ID:  Crystal Jackson, DOB May 05, 1945, MRN [id number]  PCP:  Crystal Quitter, MD   [facility] Providers Cardiologist:  None     Referring MD: Crystal Quitter, MD   Chief Complaint  Patient presents with   Loss of Consciousness    History of Present Illness:    Seen with husband, Crystal Jackson is a 76 y.o. female with a hx of near syncope    Has multiple episodes of near syncope Typically when she is sitting and then stands up, starte walking  Vision gets blurry ,   Seems to be worsening over the past several months  Was on Doxyzosin - was recently stopped  She was on chlorthalidone but became hyponatremic. Started Spironolactone  But this did not change any of her symptoms.  Does not get any regular exercise  Had Covid earlier this year ,  she was having these symptoms prior to covid.   Has cut back on salt   Does not exercise ,        Past Medical History:  Diagnosis Date   Anxiety    Arthritis    Asthma    Back pain    Bilateral swelling of feet    Constipation    Depression    Gallbladder problem    GERD (gastroesophageal reflux disease)    History of low potassium    Hypertension    Joint pain    Obesity    Palpitations    Shortness of breath    Sleep apnea     Past Surgical History:  Procedure Laterality Date   ABDOMINAL HYSTERECTOMY     APPENDECTOMY     CHOLECYSTECTOMY     KNEE SURGERY Left    SHOULDER SURGERY Right     Current Medications: Current Meds  Medication Sig   albuterol (VENTOLIN HFA) 108 (90 Base) MCG/ACT inhaler Inhale 1-2 puffs into the lungs every 6 (six) hours as needed for wheezing or shortness of breath.   Artificial Saliva (BIOTENE ORALBALANCE DRY MOUTH) GEL Use as directed 1 application. in the mouth or throat in the morning, at noon, in the evening, and at bedtime.   Calcium Carbonate-Vitamin D 600-400 MG-UNIT tablet 1 po qd (Patient taking  differently: Take 1 tablet by mouth daily.)   chlorpheniramine (CHLOR-TRIMETON) 4 MG tablet Take 1 tablet (4 mg total) by mouth every 6 (six) hours. (Patient taking differently: Take 4 mg by mouth every 6 (six) hours as needed for allergies.)   clotrimazole-betamethasone (LOTRISONE) cream 1 application. daily as needed (eczema).   diclofenac Sodium (VOLTAREN) 1 % GEL 2 g daily as needed (pain).   Fluticasone Furoate (ARNUITY ELLIPTA) 200 MCG/ACT AEPB Inhale 1 puff into the lungs daily.   Magnesium 500 MG CAPS Take 500 mg by mouth at bedtime.   metFORMIN (GLUCOPHAGE) 500 MG tablet 1 po with lunch and 1 po with dinner   metoprolol tartrate (LOPRESSOR) 25 MG tablet Take 50 mg by mouth 2 (two) times daily.   Multiple Vitamin (MULTIVITAMIN) tablet Take 1 tablet by mouth daily.    Omega-3 Fatty Acids (FISH OIL) 500 MG CAPS Take 500 mg by mouth daily.   pantoprazole (PROTONIX) 40 MG tablet Take 40 mg by mouth 2 (two) times daily.   Propylene Glycol (SYSTANE COMPLETE OP) Place 1 drop into both eyes See admin instructions. Take 5 times a day - Systane without preservatives  SUPER B COMPLEX/C PO Take 1 tablet by mouth daily.   traZODone (DESYREL) 100 MG tablet Take 150 mg by mouth at bedtime.   triamcinolone cream (KENALOG) 0.1 % Apply 1 application. topically daily as needed (itching).   venlafaxine XR (EFFEXOR-XR) 75 MG 24 hr capsule 225 mg daily with breakfast.     Allergies:   Avapro [irbesartan], Dextromethorphan, Guaifenesin & derivatives, Valtrex [valacyclovir], Citalopram, Codeine, Duloxetine, Penicillin g, and Vilazodone   Social History   Socioeconomic History   Marital status: Married    Spouse name: Not on file   Number of children: Not on file   Years of education: Not on file   Highest education level: Not on file  Occupational History   Not on file  Tobacco Use   Smoking status: Never    Passive exposure: Never   Smokeless tobacco: Never  Vaping Use   Vaping Use: Never used   Substance and Sexual Activity   Alcohol use: No    Alcohol/week: 0.0 standard drinks of alcohol   Drug use: No   Sexual activity: Yes  Other Topics Concern   Not on file  Social History Narrative   Not on file   Social Determinants of Health   Financial Resource Strain: Not on file  Food Insecurity: Not on file  Transportation Needs: Not on file  Physical Activity: Not on file  Stress: Not on file  Social Connections: Not on file     Family History: The patient's family history includes Anxiety disorder in her mother; Diabetes in her father and mother; Heart disease in her mother; Hypertension in her father; Obesity in her father and mother.  ROS:   Please see the history of present illness.     All other systems reviewed and are negative.  EKGs/Labs/Other Studies Reviewed:    The following studies were reviewed today:   EKG: May 15, 2021: Normal sinus rhythm at 94.  No ST or T wave changes.  Recent Labs: 05/14/2021: TSH 1.199 05/15/2021: ALT 18; Magnesium 1.8 05/16/2021: BUN 15; Creatinine, Ser 0.97; Hemoglobin 11.5; Platelets 148; Potassium 3.9; Sodium 136  Recent Lipid Panel    Component Value Date/Time   CHOL 170 02/07/2021 1207   TRIG 75 02/07/2021 1207   HDL 51 02/07/2021 1207   CHOLHDL 3.3 02/07/2021 1207   LDLCALC 105 (H) 02/07/2021 1207     Risk Assessment/Calculations:                Physical Exam:    VS:  BP 124/78   Pulse 70   Ht 5\' 4"  (1.626 m)   Wt 216 lb (98 kg)   SpO2 95%   BMI 37.08 kg/m     Wt Readings from Last 3 Encounters:  08/21/21 216 lb (98 kg)  07/24/21 213 lb (96.6 kg)  06/27/21 213 lb (96.6 kg)     GEN:  elderly , moderately obese female in no acute distress HEENT: Normal NECK: No JVD; No carotid bruits LYMPHATICS: No lymphadenopathy CARDIAC: Regular rate, S1-S2.  Soft systolic murmur RESPIRATORY:  Clear to auscultation without rales, wheezing or rhonchi  ABDOMEN: Soft, non-tender, non-distended MUSCULOSKELETAL:  Trace to 1+ bilateral leg edema no deformity  SKIN: Warm and dry NEUROLOGIC:  Alert and oriented x 3 PSYCHIATRIC:  Normal affect   ASSESSMENT:    1. Orthostatic hypotension    PLAN:    In order of problems listed above:  Orthostatic hypotension: 06/29/21 presents for further evaluation of orthostatic hypotension.  She is  already on minimal blood pressure medications.  She is on spironolactone 25 mg and metoprolol 25 mg twice a day.  She still has some trace leg edema. I worry that if we completely discontinue her spironolactone that she will develop significant leg edema.  We will get an echocardiogram to assess her LV and RV function as well as her valvular function.  I think she would benefit from a regular exercise program as well as some significant weight loss.  I encouraged her to work on an exercise regimen and gradually build up to exercising 30 to 45 minutes a day 4 to 5 days a week.  We can consider starting her on midodrine if she continues to have symptoms of orthostatic hypotension.          Medication Adjustments/Labs and Tests Ordered: Current medicines are reviewed at length with the patient today.  Concerns regarding medicines are outlined above.  Orders Placed This Encounter  Procedures   ECHOCARDIOGRAM COMPLETE   No orders of the defined types were placed in this encounter.    Patient Instructions  Medication Instructions:  STOP Potassium Chloride *If you need a refill on your cardiac medications before your next appointment, please call your pharmacy*   Lab Work: NONE If you have labs (blood work) drawn today and your tests are completely normal, you will receive your results only by: MyChart Message (if you have MyChart) OR A paper copy in the mail If you have any lab test that is abnormal or we need to change your treatment, we will call you to review the results.   Testing/Procedures: ECHO Your physician has requested that you have an  echocardiogram. Echocardiography is a painless test that uses sound waves to create images of your heart. It provides your doctor with information about the size and shape of your heart and how well your heart's chambers and valves are working. This procedure takes approximately one hour. There are no restrictions for this procedure.  Follow-Up: At [facility], you and your health needs are our priority.  As part of our continuing mission to provide you with exceptional heart care, we have created designated Provider Care Teams.  These Care Teams include your primary Cardiologist (physician) and Advanced Practice Providers (APPs -  Physician Assistants and Nurse Practitioners) who all work together to provide you with the care you need, when you need it.  We recommend signing up for the patient portal called "MyChart".  Sign up information is provided on this After Visit Summary.  MyChart is used to connect with patients for Virtual Visits (Telemedicine).  Patients are able to view lab/test results, encounter notes, upcoming appointments, etc.  Non-urgent messages can be sent to your provider as well.   To learn more about what you can do with MyChart, go to [email or web address].    Your next appointment:   6 month(s)  The format for your next appointment:   In Person  Provider:   Kristeen Miss, MD   Other Instructions    Important Information About Sugar         Signed, Kristeen Miss, MD  08/21/2021 6:24 PM    [facility]

## 2021-08-21 NOTE — Patient Instructions (Signed)
Medication Instructions:  STOP Potassium Chloride *If you need a refill on your cardiac medications before your next appointment, please call your pharmacy*   Lab Work: NONE If you have labs (blood work) drawn today and your tests are completely normal, you will receive your results only by: MyChart Message (if you have MyChart) OR A paper copy in the mail If you have any lab test that is abnormal or we need to change your treatment, we will call you to review the results.   Testing/Procedures: ECHO Your physician has requested that you have an echocardiogram. Echocardiography is a painless test that uses sound waves to create images of your heart. It provides your doctor with information about the size and shape of your heart and how well your heart's chambers and valves are working. This procedure takes approximately one hour. There are no restrictions for this procedure.  Follow-Up: At [facility], you and your health needs are our priority.  As part of our continuing mission to provide you with exceptional heart care, we have created designated Provider Care Teams.  These Care Teams include your primary Cardiologist (physician) and Advanced Practice Providers (APPs -  Physician Assistants and Nurse Practitioners) who all work together to provide you with the care you need, when you need it.  We recommend signing up for the patient portal called "MyChart".  Sign up information is provided on this After Visit Summary.  MyChart is used to connect with patients for Virtual Visits (Telemedicine).  Patients are able to view lab/test results, encounter notes, upcoming appointments, etc.  Non-urgent messages can be sent to your provider as well.   To learn more about what you can do with MyChart, go to [email or web address].    Your next appointment:   6 month(s)  The format for your next appointment:   In Person  Provider:   Kristeen Miss, MD   Other Instructions    Important  Information About Sugar

## 2021-08-28 ENCOUNTER — Encounter (INDEPENDENT_AMBULATORY_CARE_PROVIDER_SITE_OTHER): Payer: Self-pay | Admitting: Family Medicine

## 2021-08-28 ENCOUNTER — Ambulatory Visit (INDEPENDENT_AMBULATORY_CARE_PROVIDER_SITE_OTHER): Payer: Medicare Other | Admitting: Family Medicine

## 2021-08-28 VITALS — BP 132/77 | HR 62 | Temp 97.6°F | Ht 64.0 in | Wt 214.0 lb

## 2021-08-28 DIAGNOSIS — E7849 Other hyperlipidemia: Secondary | ICD-10-CM | POA: Diagnosis not present

## 2021-08-28 DIAGNOSIS — R7303 Prediabetes: Secondary | ICD-10-CM

## 2021-08-28 DIAGNOSIS — Z6836 Body mass index (BMI) 36.0-36.9, adult: Secondary | ICD-10-CM

## 2021-08-28 DIAGNOSIS — E559 Vitamin D deficiency, unspecified: Secondary | ICD-10-CM | POA: Insufficient documentation

## 2021-08-28 DIAGNOSIS — E669 Obesity, unspecified: Secondary | ICD-10-CM

## 2021-09-05 NOTE — Progress Notes (Signed)
Chief Complaint:   OBESITY Crystal Jackson is here to discuss her progress with her obesity treatment plan along with follow-up of her obesity related diagnoses. Crystal Jackson is on the Category 2 Plan and states she is following her eating plan approximately 50% of the time. Crystal Jackson states she is walking 15 minutes 1 times per week.  Today's visit was #: 60 Starting weight: 242 lbs Starting date: 07/09/2018 Today's weight: 214 lbs Today's date: 08/28/2021 Total lbs lost to date: 28 Total lbs lost since last in-office visit: +1  Interim History: Crystal Jackson ate very well for the first couple of weeks and the last 2 weeks, she had a lot of appointments and ate out a lot more than usual. She denies issues with meal plan.  Subjective:   1. Pre-diabetes Pt's A1c 6 months ago was 5.7.  2. Other hyperlipidemia Her LDL was elevated at 105 6 months ago. Pt is not on meds, except fish oil.  3. Vitamin D deficiency She is currently taking OTC calcium/vitamin D 600-400 IU each day. She denies nausea, vomiting or muscle weakness.  Assessment/Plan:  No orders of the defined types were placed in this encounter.   There are no discontinued medications.   No orders of the defined types were placed in this encounter.    1. Pre-diabetes Crystal Jackson will continue to work on weight loss, exercise, and decreasing simple carbohydrates to help decrease the risk of diabetes. Continue Metformin. Check fasting labs at next OV.  2. Other hyperlipidemia Cardiovascular risk and specific lipid/LDL goals reviewed.  We discussed several lifestyle modifications today and Crystal Jackson will continue to work on diet, exercise and weight loss efforts. Orders and follow up as documented in patient record. Continue prudent nutritional plan. Check fasting labs at next OV.  Counseling Intensive lifestyle modifications are the first line treatment for this issue. Dietary changes: Increase soluble fiber. Decrease simple  carbohydrates. Exercise changes: Moderate to vigorous-intensity aerobic activity 150 minutes per week if tolerated. Lipid-lowering medications: see documented in medical record.  3. Vitamin D deficiency Low Vitamin D level contributes to fatigue and are associated with obesity, breast, and colon cancer. She agrees to continue OTC Calcium-Vitamin D 600-400 IU daily and will follow-up for routine testing of Vitamin D, at least 2-3 times per year to avoid over-replacement. Check fasting labs at next OV.  4. Obesity, current BMI 36.8 Crystal Jackson is currently in the action stage of change. As such, her goal is to continue with weight loss efforts. She has agreed to the Category 2 Plan.   07/27/2021, pt had labs with PCP, BMP- serum creatinine was 0.9, and all within normal limits, otherwise. Labs reviewed with pt today.  Exercise goals:  Walk 15 minutes 3 days a week.  Behavioral modification strategies: increasing lean protein intake, decreasing simple carbohydrates, and planning for success.  Crystal Jackson has agreed to follow-up with our clinic in 3-4 weeks for fasting blood work and repeat IC. She was informed of the importance of frequent follow-up visits to maximize her success with intensive lifestyle modifications for her multiple health conditions.   Objective:   Blood pressure 132/77, pulse 62, temperature 97.6 F (36.4 C), height 5\' 4"  (1.626 m), weight 214 lb (97.1 kg), SpO2 97 %. Body mass index is 36.73 kg/m.  General: Cooperative, alert, well developed, in no acute distress. HEENT: Conjunctivae and lids unremarkable. Cardiovascular: Regular rhythm.  Lungs: Normal work of breathing. Neurologic: No focal deficits.   Lab Results  Component Value Date  CREATININE 0.97 05/16/2021   BUN 15 05/16/2021   NA 136 05/16/2021   K 3.9 05/16/2021   CL 100 05/16/2021   CO2 29 05/16/2021   Lab Results  Component Value Date   ALT 18 05/15/2021   AST 23 05/15/2021   ALKPHOS 26 (L)  05/15/2021   BILITOT 0.6 05/15/2021   Lab Results  Component Value Date   HGBA1C 5.7 (H) 02/07/2021   HGBA1C 5.6 06/29/2020   HGBA1C 5.4 10/19/2019   HGBA1C 5.3 05/27/2019   HGBA1C 5.4 11/10/2018   Lab Results  Component Value Date   INSULIN 17.5 02/07/2021   INSULIN 17.7 06/29/2020   INSULIN 22.6 10/19/2019   INSULIN 16.2 05/27/2019   INSULIN 33.0 (H) 11/10/2018   Lab Results  Component Value Date   TSH 1.199 05/14/2021   Lab Results  Component Value Date   CHOL 170 02/07/2021   HDL 51 02/07/2021   LDLCALC 105 (H) 02/07/2021   TRIG 75 02/07/2021   CHOLHDL 3.3 02/07/2021   Lab Results  Component Value Date   VD25OH 55.9 02/07/2021   VD25OH 44.7 06/29/2020   VD25OH 51.9 10/19/2019   Lab Results  Component Value Date   WBC 9.3 05/16/2021   HGB 11.5 (L) 05/16/2021   HCT 34.5 (L) 05/16/2021   MCV 91.5 05/16/2021   PLT 148 (L) 05/16/2021   No results found for: "IRON", "TIBC", "FERRITIN"  Obesity Behavioral Intervention:   Approximately 15 minutes were spent on the discussion below.  ASK: We discussed the diagnosis of obesity with Crystal Jackson today and Crystal Jackson agreed to give [name] permission to discuss obesity behavioral modification therapy today.  ASSESS: Crystal Jackson has the diagnosis of obesity and her BMI today is 36.8. Crystal Jackson is in the action stage of change.   ADVISE: Crystal Jackson was educated on the multiple health risks of obesity as well as the benefit of weight loss to improve her health. She was advised of the need for long term treatment and the importance of lifestyle modifications to improve her current health and to decrease her risk of future health problems.  AGREE: Multiple dietary modification options and treatment options were discussed and [name] agreed to follow the recommendations documented in the above note.  ARRANGE: [name] was educated on the importance of frequent visits to treat obesity as outlined per CMS and USPSTF guidelines and agreed to  schedule her next follow up appointment today.  Attestation Statements:   Reviewed by clinician on day of visit: allergies, medications, problem list, medical history, surgical history, family history, social history, and previous encounter notes.  I, Kyung Rudd, BS, CMA, am acting as transcriptionist for [employer], DO.   I have reviewed the above documentation for accuracy and completeness, and I agree with the above. Carlye Grippe, D.O.  The 21st Century Cures Act was signed into law in 2016 which includes the topic of electronic health records.  This provides immediate access to information in MyChart.  This includes consultation notes, operative notes, office notes, lab results and pathology reports.  If you have any questions about what you read please let us know at your next visit so we can discuss your concerns and take corrective action if need be.  We are right here with you.

## 2021-09-06 ENCOUNTER — Ambulatory Visit (HOSPITAL_COMMUNITY): Payer: Medicare Other | Attending: Cardiovascular Disease

## 2021-09-06 DIAGNOSIS — I951 Orthostatic hypotension: Secondary | ICD-10-CM

## 2021-09-06 LAB — ECHOCARDIOGRAM COMPLETE
Area-P 1/2: 3.6 cm2
S' Lateral: 2.5 cm

## 2021-09-27 ENCOUNTER — Other Ambulatory Visit (INDEPENDENT_AMBULATORY_CARE_PROVIDER_SITE_OTHER): Payer: Self-pay | Admitting: Family Medicine

## 2021-09-27 ENCOUNTER — Ambulatory Visit (INDEPENDENT_AMBULATORY_CARE_PROVIDER_SITE_OTHER): Payer: Medicare Other | Admitting: Family Medicine

## 2021-09-27 ENCOUNTER — Encounter (INDEPENDENT_AMBULATORY_CARE_PROVIDER_SITE_OTHER): Payer: Self-pay | Admitting: Family Medicine

## 2021-09-27 VITALS — BP 104/68 | HR 77 | Temp 97.8°F | Ht 64.0 in | Wt 213.0 lb

## 2021-09-27 DIAGNOSIS — E7849 Other hyperlipidemia: Secondary | ICD-10-CM

## 2021-09-27 DIAGNOSIS — E669 Obesity, unspecified: Secondary | ICD-10-CM

## 2021-09-27 DIAGNOSIS — E559 Vitamin D deficiency, unspecified: Secondary | ICD-10-CM

## 2021-09-27 DIAGNOSIS — R42 Dizziness and giddiness: Secondary | ICD-10-CM | POA: Diagnosis not present

## 2021-09-27 DIAGNOSIS — R7303 Prediabetes: Secondary | ICD-10-CM | POA: Diagnosis not present

## 2021-09-27 DIAGNOSIS — E538 Deficiency of other specified B group vitamins: Secondary | ICD-10-CM

## 2021-09-27 DIAGNOSIS — Z6836 Body mass index (BMI) 36.0-36.9, adult: Secondary | ICD-10-CM

## 2021-09-27 DIAGNOSIS — R0602 Shortness of breath: Secondary | ICD-10-CM | POA: Diagnosis not present

## 2021-09-27 MED ORDER — METFORMIN HCL 500 MG PO TABS: ORAL_TABLET | ORAL | 0 refills | Status: DC

## 2021-09-29 LAB — COMPREHENSIVE METABOLIC PANEL
ALT: 22 IU/L (ref 0–32)
AST: 24 IU/L (ref 0–40)
Albumin/Globulin Ratio: 1.8 (ref 1.2–2.2)
Albumin: 4.5 g/dL (ref 3.8–4.8)
Alkaline Phosphatase: 45 IU/L (ref 44–121)
BUN/Creatinine Ratio: 20 (ref 12–28)
BUN: 21 mg/dL (ref 8–27)
Bilirubin Total: 0.4 mg/dL (ref 0.0–1.2)
CO2: 23 mmol/L (ref 20–29)
Calcium: 9.6 mg/dL (ref 8.7–10.3)
Chloride: 96 mmol/L (ref 96–106)
Creatinine, Ser: 1.05 mg/dL — ABNORMAL HIGH (ref 0.57–1.00)
Globulin, Total: 2.5 g/dL (ref 1.5–4.5)
Glucose: 104 mg/dL — ABNORMAL HIGH (ref 70–99)
Potassium: 4.5 mmol/L (ref 3.5–5.2)
Sodium: 137 mmol/L (ref 134–144)
Total Protein: 7 g/dL (ref 6.0–8.5)
eGFR: 56 mL/min/{1.73_m2} — ABNORMAL LOW (ref >59)

## 2021-09-29 LAB — HEMOGLOBIN A1C
Est. average glucose Bld gHb Est-mCnc: 117 mg/dL
Hgb A1c MFr Bld: 5.7 % — ABNORMAL HIGH (ref 4.8–5.6)

## 2021-09-29 LAB — LIPID PANEL
Chol/HDL Ratio: 2.5 ratio (ref 0.0–4.4)
Cholesterol, Total: 149 mg/dL (ref 100–199)
HDL: 60 mg/dL (ref >39)
LDL Chol Calc (NIH): 74 mg/dL (ref 0–99)
Triglycerides: 80 mg/dL (ref 0–149)
VLDL Cholesterol Cal: 15 mg/dL (ref 5–40)

## 2021-09-29 LAB — VITAMIN D 25 HYDROXY (VIT D DEFICIENCY, FRACTURES): Vit D, 25-Hydroxy: 47.7 ng/mL (ref 30.0–100.0)

## 2021-09-29 LAB — MAGNESIUM: Magnesium: 2 mg/dL (ref 1.6–2.3)

## 2021-09-29 LAB — INSULIN, RANDOM: INSULIN: 18.4 u[IU]/mL (ref 2.6–24.9)

## 2021-09-29 LAB — VITAMIN B12: Vitamin B-12: 961 pg/mL (ref 232–1245)

## 2021-10-03 ENCOUNTER — Encounter: Payer: Self-pay | Admitting: Pulmonary Disease

## 2021-10-03 ENCOUNTER — Ambulatory Visit: Payer: Medicare Other | Admitting: Pulmonary Disease

## 2021-10-03 ENCOUNTER — Telehealth: Payer: Self-pay | Admitting: Cardiovascular Disease

## 2021-10-03 VITALS — BP 130/76 | HR 69 | Temp 97.6°F | Ht 64.0 in | Wt 219.6 lb

## 2021-10-03 DIAGNOSIS — R059 Cough, unspecified: Secondary | ICD-10-CM | POA: Diagnosis not present

## 2021-10-03 DIAGNOSIS — J4530 Mild persistent asthma, uncomplicated: Secondary | ICD-10-CM | POA: Diagnosis not present

## 2021-10-03 DIAGNOSIS — G4733 Obstructive sleep apnea (adult) (pediatric): Secondary | ICD-10-CM | POA: Diagnosis not present

## 2021-10-03 MED ORDER — ARNUITY ELLIPTA 200 MCG/ACT IN AEPB: 1.0000 | INHALATION_SPRAY | Freq: Every day | RESPIRATORY_TRACT | 11 refills | Status: DC

## 2021-10-03 MED ORDER — MIDODRINE HCL 5 MG PO TABS: 5.0000 mg | ORAL_TABLET | Freq: Three times a day (TID) | ORAL | 5 refills | Status: DC

## 2021-10-03 MED ORDER — ALBUTEROL SULFATE HFA 108 (90 BASE) MCG/ACT IN AERS: 1.0000 | INHALATION_SPRAY | Freq: Four times a day (QID) | RESPIRATORY_TRACT | 11 refills | Status: AC | PRN

## 2021-10-03 NOTE — Telephone Encounter (Signed)
Nahser, Wonda Cheng, MD  You 1 hour ago (2:52 PM)    Lets try midodrine 5 mg TID.  Have her continue to record her BP and bring her BP log to her next office visit   PN    Called and spoke with patient who will record BP and call us with her log in 1-2 weeks as she's not due to return to office until spring 2024.

## 2021-10-03 NOTE — Telephone Encounter (Signed)
Per OV note on 08/21/21:  We can consider starting her on midodrine if she continues to have symptoms of orthostatic hypotension.  Will route to MD for dosing specifics.

## 2021-10-03 NOTE — Progress Notes (Signed)
Chief Complaint:   OBESITY Velma is here to discuss her progress with her obesity treatment plan along with follow-up of her obesity related diagnoses. Dannica is on the Category 2 Plan and states she is following her eating plan approximately 75% of the time. Taite states she is walking 20 minutes 1 times per week.  Today's visit was #: 68 Starting weight: 242 lbs Starting date: 07/09/2018 Today's weight: 213 lbs Today's date: 09/27/2021 Total lbs lost to date: 29 Total lbs lost since last in-office visit: 1  Interim History: Dariane has been trying to eat more on plan and has increased her activity. Her goal was to walk 15 minutes 3 days a week. Pt is not eating all of her proteins on plan.  Subjective:   1. SOB (shortness of breath) on exertion Brittne notes increasing shortness of breath with exercising and seems to be worsening over time with weight gain. She notes getting out of breath sooner with activity than she used to. This has gotten worse recently. Otillia denies shortness of breath at rest or orthopnea.  2. Pre-diabetes Amauri is on Metformin but occasionally forgets to take it later in the day.  3. Other hyperlipidemia Her LDL was elevated 7 months ago at 105. Medication: None  4. Chronic vertigo Pt is still having symptoms and has seen several specialists, including neuro and cardiology with negative work-ups.  5. B12 deficiency due to diet Lee's last B12 was 267 and checked on 03/10/2019 at [facility].  6. Vitamin D deficiency Pt is on OTC Calcium-Vit D daily supplement.  Assessment/Plan:   Orders Placed This Encounter  Procedures   VITAMIN D 25 Hydroxy (Vit-D Deficiency, Fractures)   Vitamin B12   Hemoglobin A1c   Insulin, random   Lipid panel   Comprehensive metabolic panel   Magnesium   Magnesium    Medications Discontinued During This Encounter  Medication Reason   metFORMIN (GLUCOPHAGE) 500 MG tablet Reorder      Meds ordered this encounter  Medications   metFORMIN (GLUCOPHAGE) 500 MG tablet    Sig: 1 po with lunch and 1 po with dinner    Dispense:  60 tablet    Refill:  0    30 d supply; ov for RF     1. SOB (shortness of breath) on exertion Pt's IC in 07/2018 was 1605 and today's IC is 1512, which is very good. Despite 30 lb weight loss, change to category 1 with 6-8 oz of protein at dinner.  2. Pre-diabetes Remedios will continue to work on weight loss, exercise, and decreasing simple carbohydrates to help decrease the risk of diabetes. Take Metformin regularly.  Refill- metFORMIN (GLUCOPHAGE) 500 MG tablet; 1 po with lunch and 1 po with dinner  Dispense: 60 tablet; Refill: 0  Lab/Orders today or future: - Hemoglobin A1c - Insulin, random - Comprehensive metabolic panel - Magnesium  3. Other hyperlipidemia Cardiovascular risk and specific lipid/LDL goals reviewed.  We discussed several lifestyle modifications today and Angi will continue to work on diet, exercise and weight loss efforts. Orders and follow up as documented in patient record.   Counseling Intensive lifestyle modifications are the first line treatment for this issue. Dietary changes: Increase soluble fiber. Decrease simple carbohydrates. Exercise changes: Moderate to vigorous-intensity aerobic activity 150 minutes per week if tolerated. Lipid-lowering medications: see documented in medical record.  Lab/Orders today or future: - Lipid panel - Comprehensive metabolic panel  4. Chronic vertigo I recommend  pt follow up with PCP and inquire on vestibular PT or other treatment options.  5. B12 deficiency due to diet The diagnosis was reviewed with the patient. Counseling provided today, see below. We will continue to monitor. Orders and follow up as documented in patient record.  Counseling The body needs vitamin B12: to make red blood cells; to make DNA; and to help the nerves work properly so they can carry  messages from the brain to the body.  The main causes of vitamin B12 deficiency include dietary deficiency, digestive diseases, pernicious anemia, and having a surgery in which part of the stomach or small intestine is removed.  Certain medicines can make it harder for the body to absorb vitamin B12. These medicines include: heartburn medications; some antibiotics; some medications used to treat diabetes, gout, and high cholesterol.  In some cases, there are no symptoms of this condition. If the condition leads to anemia or nerve damage, various symptoms can occur, such as weakness or fatigue, shortness of breath, and numbness or tingling in your hands and feet.   Treatment:  May include taking vitamin B12 supplements.  Avoid alcohol.  Eat lots of healthy foods that contain vitamin B12: Beef, pork, chicken, [place], and organ meats, such as liver.  Seafood: This includes clams, rainbow trout, salmon, tuna, and haddock. Eggs.  Cereal and dairy products that are fortified: This means that vitamin B12 has been added to the food.   Lab/Orders today or future: - Vitamin B12  6. Vitamin D deficiency Low Vitamin D level contributes to fatigue and are associated with obesity, breast, and colon cancer. She agrees to continue OTC Calcium-Vitamin D supplement and will follow-up for routine testing of Vitamin D, at least 2-3 times per year to avoid over-replacement.  Lab/Orders today or future: - VITAMIN D 25 Hydroxy (Vit-D Deficiency, Fractures)  7. Obesity, current BMI 36.6 Eulogia is currently in the action stage of change. As such, her goal is to continue with weight loss efforts. She has agreed to change to  the Category 1 Plan with 6-8 oz of protein at dinner.   Exercise goals: Older adults should follow the adult guidelines. When older adults cannot meet the adult guidelines, they should be as physically active as their abilities and conditions will allow.   Behavioral modification strategies:  increasing lean protein intake and no skipping meals.  Tzipora has agreed to follow-up with our clinic in 3-4 weeks. She was informed of the importance of frequent follow-up visits to maximize her success with intensive lifestyle modifications for her multiple health conditions.   Meenakshi was informed we would discuss her lab results at her next visit unless there is a critical issue that needs to be addressed sooner. Lekia agreed to keep her next visit at the agreed upon time to discuss these results.  Objective:   Blood pressure 104/68, pulse 77, temperature 97.8 F (36.6 C), height 5\' 4"  (1.626 m), weight 213 lb (96.6 kg), SpO2 95 %. Body mass index is 36.56 kg/m.  General: Cooperative, alert, well developed, in no acute distress. HEENT: Conjunctivae and lids unremarkable. Cardiovascular: Regular rhythm.  Lungs: Normal work of breathing. Neurologic: No focal deficits.   Lab Results  Component Value Date   CREATININE 1.05 (H) 09/27/2021   BUN 21 09/27/2021   NA 137 09/27/2021   K 4.5 09/27/2021   CL 96 09/27/2021   CO2 23 09/27/2021   Lab Results  Component Value Date   ALT 22 09/27/2021   AST  24 09/27/2021   ALKPHOS 45 09/27/2021   BILITOT 0.4 09/27/2021   Lab Results  Component Value Date   HGBA1C 5.7 (H) 09/27/2021   HGBA1C 5.7 (H) 02/07/2021   HGBA1C 5.6 06/29/2020   HGBA1C 5.4 10/19/2019   HGBA1C 5.3 05/27/2019   Lab Results  Component Value Date   INSULIN 18.4 09/27/2021   INSULIN 17.5 02/07/2021   INSULIN 17.7 06/29/2020   INSULIN 22.6 10/19/2019   INSULIN 16.2 05/27/2019   Lab Results  Component Value Date   TSH 1.199 05/14/2021   Lab Results  Component Value Date   CHOL 149 09/27/2021   HDL 60 09/27/2021   LDLCALC 74 09/27/2021   TRIG 80 09/27/2021   CHOLHDL 2.5 09/27/2021   Lab Results  Component Value Date   VD25OH 47.7 09/27/2021   VD25OH 55.9 02/07/2021   VD25OH 44.7 06/29/2020   Lab Results  Component Value Date   WBC 9.3  05/16/2021   HGB 11.5 (L) 05/16/2021   HCT 34.5 (L) 05/16/2021   MCV 91.5 05/16/2021   PLT 148 (L) 05/16/2021   No results found for: "IRON", "TIBC", "FERRITIN"  Obesity Behavioral Intervention:   Approximately 15 minutes were spent on the discussion below.  ASK: We discussed the diagnosis of obesity with Gaige today and [name] agreed to give [name] permission to discuss obesity behavioral modification therapy today.  ASSESS: Francesca has the diagnosis of obesity and her BMI today is 36.6. Tuwanna is in the action stage of change.   ADVISE: Jacqline was educated on the multiple health risks of obesity as well as the benefit of weight loss to improve her health. She was advised of the need for long term treatment and the importance of lifestyle modifications to improve her current health and to decrease her risk of future health problems.  AGREE: Multiple dietary modification options and treatment options were discussed and Carren agreed to follow the recommendations documented in the above note.  ARRANGE: Deidra was educated on the importance of frequent visits to treat obesity as outlined per CMS and USPSTF guidelines and agreed to schedule her next follow up appointment today.  Attestation Statements:   Reviewed by clinician on day of visit: allergies, medications, problem list, medical history, surgical history, family history, social history, and previous encounter notes.  I, Kathlene November, BS, CMA, am acting as transcriptionist for [employer], DO.    I have reviewed the above documentation for accuracy and completeness, and I agree with the above. Marjory Sneddon, D.O.  The [place] was signed into law in 2016 which includes the topic of electronic health records.  This provides immediate access to information in MyChart.  This includes consultation notes, operative notes, office notes, lab results and pathology reports.  If you have any questions about  what you read please let us know at your next visit so we can discuss your concerns and take corrective action if need be.  We are right here with you.

## 2021-10-03 NOTE — Addendum Note (Signed)
Addended by: Elton Sin on: 10/03/2021 03:25 PM   Modules accepted: Orders

## 2021-10-03 NOTE — Patient Instructions (Signed)
I am glad you are doing well with regard to your breathing Continue the Arnuity inhaler.  We will call in a refill for 1 year Follow-up in 1 year.

## 2021-10-03 NOTE — Telephone Encounter (Signed)
Patient is called she is still having dizziness and is interested in starting the medication Dr. Acie Fredrickson had discussed with her.

## 2021-10-03 NOTE — Progress Notes (Signed)
Crystal Jackson    [id number]    July 09, 1945  Primary Care Physician:Wile, Jesse Sans, MD  Referring Physician: Michael Boston, MD [address] [place],  [place] [address]  Chief complaint:   Follow up for Upper airway chronic cough,  Asthmatic bronchitis. GERD OSA on auto CPAP   HPI: Crystal Jackson is a 76 y.o.  with past medical history of high blood pressure. She had been diagnosed with bronchitis as a child. She reports increasing frequency of attacks of the past few years. Her latest episode was in Nov 2017  Her symptoms start with sinusitis postnasal drip which then proceeds to cough with yellow mucus, wheezing, chest congestion. She does not report any fevers or chills. She has seasonal allergies, rhinitis, postnasal drip. She has significant issues with acid reflux and heartburn and is on Protonix 40 mg twice a day. She was assessed by GI and was told she has a small hiatal hernia.  Symptoms have stabilized on Arnuity inhaler.  In 2019 she stopped it temporarily and noted increasing cough, dyspnea.  Interim History: No changes to breathing Continues on as needed T Doing well with CPAP at night  She was hospitalized in may of 2023 with kidney stones and urinary tract infection.  Lung imaging at that time did not show any acute abnormality.  I have reviewed the hospitalization records and images personally.   Outpatient Encounter Medications as of 10/03/2021  Medication Sig   albuterol (VENTOLIN HFA) 108 (90 Base) MCG/ACT inhaler Inhale 1-2 puffs into the lungs every 6 (six) hours as needed for wheezing or shortness of breath.   Artificial Saliva (BIOTENE ORALBALANCE DRY MOUTH) GEL Use as directed 1 application. in the mouth or throat in the morning, at noon, in the evening, and at bedtime.   Calcium Carbonate-Vitamin D 600-400 MG-UNIT tablet 1 po qd (Patient taking differently: Take 1 tablet by mouth daily.)   chlorpheniramine (CHLOR-TRIMETON) 4 MG tablet Take 1 tablet (4 mg  total) by mouth every 6 (six) hours. (Patient taking differently: Take 4 mg by mouth every 6 (six) hours as needed for allergies.)   clotrimazole-betamethasone (LOTRISONE) cream 1 application. daily as needed (eczema).   diclofenac Sodium (VOLTAREN) 1 % GEL 2 g daily as needed (pain).   Fluticasone Furoate (ARNUITY ELLIPTA) 200 MCG/ACT AEPB Inhale 1 puff into the lungs daily.   Magnesium 500 MG CAPS Take 500 mg by mouth at bedtime.   metFORMIN (GLUCOPHAGE) 500 MG tablet 1 po with lunch and 1 po with dinner   metoprolol tartrate (LOPRESSOR) 25 MG tablet Take 50 mg by mouth 2 (two) times daily.   Multiple Vitamin (MULTIVITAMIN) tablet Take 1 tablet by mouth daily.    Omega-3 Fatty Acids (FISH OIL) 500 MG CAPS Take 500 mg by mouth daily.   pantoprazole (PROTONIX) 40 MG tablet Take 40 mg by mouth 2 (two) times daily.   Propylene Glycol (SYSTANE COMPLETE OP) Place 1 drop into both eyes See admin instructions. Take 5 times a day - Systane without preservatives   spironolactone (ALDACTONE) 25 MG tablet Take 25 mg by mouth daily.   SUPER B COMPLEX/C PO Take 1 tablet by mouth daily.   traZODone (DESYREL) 100 MG tablet Take 150 mg by mouth at bedtime.   triamcinolone cream (KENALOG) 0.1 % Apply 1 application. topically daily as needed (itching).   venlafaxine XR (EFFEXOR-XR) 75 MG 24 hr capsule 225 mg daily with breakfast.   No facility-administered encounter medications  on file as of 10/03/2021.   Physical Exam: Blood pressure 130/76, pulse 69, temperature 97.6 F (36.4 C), temperature source Oral, height 5\' 4"  (1.626 m), weight 219 lb 9.6 oz (99.6 kg), SpO2 99 %. Gen:      No acute distress HEENT:  EOMI, sclera anicteric Neck:     No masses; no thyromegaly Lungs:    Clear to auscultation bilaterally; normal respiratory effort CV:         Regular rate and rhythm; no murmurs Abd:      + bowel sounds; soft, non-tender; no palpable masses, no distension Ext:    No edema; adequate peripheral  perfusion Skin:      Warm and dry; no rash Neuro: alert and oriented x 3 Psych: normal mood and affect   Data Reviewed: Images: CT abdomen pelvis 05/12/2021-visualized lung bases neurologic abnormality Chest x-ray 05/14/2021-visualized lung bases show mild left basal atelectasis I reviewed the images personally.  PFTs 7/44/17 FVC 2.54 [83%], FEV1 1.9 to [83%), F/F 76, TLC 89%, DLCO 72%. Minimal obstructive lung disease with mild reduction in diffusion capacity that corrects for alveolar volume.  FENO 04/15/17-18  Sleep study 07/20/15 Mild OSA, AHI 11.4. Low O2 sats of 71%.  CPAP download 09/23/2019 97% usage with residual AHI of 0.3, median pressure of 5.4  Assessment/Plan: Upper airway cough syndrome, Asthmatic bronchitis. Her PFTs show minimal obstruction. However there is marked reduction in mid flow rates indicating small airway disease with significant improvement post albuterol inhaler.   Continue current medications We had attempted trial of steroid inhaler in the past but she was not hence back on it Flonase, chlorpheniramine as needed  OSA Mild OSA on recent home sleep study.  Continues on AutoSet CPAP 5- 15  GERD Continues on protonix  Plan: - Continue chlorpheniramine, flonase as needed - Continue arnuity inhaler, albuterol PRN - Autoset CPAP. Work on weight loss.  09/25/2019 MD [facility] 10/03/2021, 2:46 PM  CC: 12/03/2021, MD

## 2021-10-13 ENCOUNTER — Encounter: Payer: Self-pay | Admitting: Cardiovascular Disease

## 2021-10-25 ENCOUNTER — Ambulatory Visit (INDEPENDENT_AMBULATORY_CARE_PROVIDER_SITE_OTHER): Payer: Medicare Other | Admitting: Family Medicine

## 2021-10-25 ENCOUNTER — Other Ambulatory Visit (INDEPENDENT_AMBULATORY_CARE_PROVIDER_SITE_OTHER): Payer: Self-pay | Admitting: Family Medicine

## 2021-10-25 ENCOUNTER — Encounter (INDEPENDENT_AMBULATORY_CARE_PROVIDER_SITE_OTHER): Payer: Self-pay | Admitting: Family Medicine

## 2021-10-25 VITALS — BP 121/76 | HR 71 | Temp 97.6°F | Ht 64.0 in | Wt 216.0 lb

## 2021-10-25 DIAGNOSIS — G8929 Other chronic pain: Secondary | ICD-10-CM

## 2021-10-25 DIAGNOSIS — R7303 Prediabetes: Secondary | ICD-10-CM | POA: Diagnosis not present

## 2021-10-25 DIAGNOSIS — Z6837 Body mass index (BMI) 37.0-37.9, adult: Secondary | ICD-10-CM

## 2021-10-25 DIAGNOSIS — E559 Vitamin D deficiency, unspecified: Secondary | ICD-10-CM | POA: Diagnosis not present

## 2021-10-25 DIAGNOSIS — E669 Obesity, unspecified: Secondary | ICD-10-CM

## 2021-10-25 DIAGNOSIS — E538 Deficiency of other specified B group vitamins: Secondary | ICD-10-CM | POA: Diagnosis not present

## 2021-10-25 DIAGNOSIS — M25569 Pain in unspecified knee: Secondary | ICD-10-CM | POA: Diagnosis not present

## 2021-10-25 MED ORDER — METFORMIN HCL 500 MG PO TABS: ORAL_TABLET | ORAL | 0 refills | Status: DC

## 2021-10-26 ENCOUNTER — Encounter: Payer: Self-pay | Admitting: Cardiovascular Disease

## 2021-11-07 NOTE — Progress Notes (Signed)
Chief Complaint:   OBESITY Crystal Jackson is here to discuss her progress with her obesity treatment plan along with follow-up of her obesity related diagnoses. Crystal Jackson is on the Category 1 Plan with 6-8 ounces of protein at dinner and states she is following her eating plan approximately 50% of the time. Crystal Jackson states she is walking for 15 minutes 2 times per week.  Today's visit was #: 40 Starting weight: 242 lbs Starting date: 07/09/2018 Today's weight: 216 lbs Today's date: 10/25/2021 Total lbs lost to date: 26 Total lbs lost since last in-office visit: 0  Interim History: At the patient's last office visit we changed her to category 1 with 6-8 ounces of protein at dinner.  She states it is difficult to eat all of the protein at night.  She is really not eating her snack calories.  Her cravings are much better on her new meal plan.  She denies hunger or issues otherwise.  She had a few off the plan eating days which resulted in weight gain today.  She is here to review labs.  Subjective:   1. Pre-diabetes Patient's fasting insulin is now 18.4 and her A1c is 5.7.  Her medication works well to decrease her hunger and cravings.  She denies side effects with metformin.  She has worsening A1c, but only at 5.7.  I discussed labs with the patient today.  2. Vitamin B12 deficiency Patient takes B12 complex once daily 500 mcg.  Her prior B12 level was 267.  I discussed labs with the patient today.  3. Vitamin D deficiency Patient's vitamin D level is now 47.7.  She takes multivitamins with vitamin D and it once daily.  I discussed labs with the patient today.  4. Chronic knee pain, unspecified laterality Patient's CMP shows serum creatinine elevated at 1.05.  This is likely due to increased use of Advil and Aleve for pain.  She uses this once or twice daily for knee and joint pain.  Assessment/Plan:  No orders of the defined types were placed in this encounter.   Medications Discontinued  During This Encounter  Medication Reason   metFORMIN (GLUCOPHAGE) 500 MG tablet Reorder     Meds ordered this encounter  Medications   metFORMIN (GLUCOPHAGE) 500 MG tablet    Sig: 1 po with lunch and 1 po with dinner    Dispense:  60 tablet    Refill:  0    30 d supply; ov for RF     1. Pre-diabetes Patient will continue metformin 500 mg twice daily, and we will refill for 1 month.  - metFORMIN (GLUCOPHAGE) 500 MG tablet; 1 po with lunch and 1 po with dinner  Dispense: 60 tablet; Refill: 0  2. Vitamin B12 deficiency Patient's B12 level is at goal at 961 now.  She will continue her B12 supplementation and her prudent nutritional plan.  3. Vitamin D deficiency Patient's B12 level is at goal.  She will continue with her weight loss, prudent nutritional plan, and multivitamins.  She will obtain a bone density exam every 2 years.  4. Chronic knee pain, unspecified laterality RICE (rest, ice, compression, elevation) principles were discussed with the patient today.  She will follow-up with Ortho and changed to Tylenol for pain.  5. Obesity, current BMI 37.2 Crystal Jackson is currently in the action stage of change. As such, her goal is to continue with weight loss efforts. She has agreed to the Category 1 Plan with 6-8 ounces of protein at  dinner.   Exercise goals: As is, increase as tolerated.   Behavioral modification strategies: increasing lean protein intake and no skipping meals.  Crystal Jackson has agreed to follow-up with our clinic in 3 to 4 weeks. She was informed of the importance of frequent follow-up visits to maximize her success with intensive lifestyle modifications for her multiple health conditions.   Objective:   Blood pressure 121/76, pulse 71, temperature 97.6 F (36.4 C), height 5\' 4"  (1.626 m), weight 216 lb (98 kg), SpO2 98 %. Body mass index is 37.08 kg/m.  General: Cooperative, alert, well developed, in no acute distress. HEENT: Conjunctivae and lids  unremarkable. Cardiovascular: Regular rhythm.  Lungs: Normal work of breathing. Neurologic: No focal deficits.   Lab Results  Component Value Date   CREATININE 1.05 (H) 09/27/2021   BUN 21 09/27/2021   NA 137 09/27/2021   K 4.5 09/27/2021   CL 96 09/27/2021   CO2 23 09/27/2021   Lab Results  Component Value Date   ALT 22 09/27/2021   AST 24 09/27/2021   ALKPHOS 45 09/27/2021   BILITOT 0.4 09/27/2021   Lab Results  Component Value Date   HGBA1C 5.7 (H) 09/27/2021   HGBA1C 5.7 (H) 02/07/2021   HGBA1C 5.6 06/29/2020   HGBA1C 5.4 10/19/2019   HGBA1C 5.3 05/27/2019   Lab Results  Component Value Date   INSULIN 18.4 09/27/2021   INSULIN 17.5 02/07/2021   INSULIN 17.7 06/29/2020   INSULIN 22.6 10/19/2019   INSULIN 16.2 05/27/2019   Lab Results  Component Value Date   TSH 1.199 05/14/2021   Lab Results  Component Value Date   CHOL 149 09/27/2021   HDL 60 09/27/2021   LDLCALC 74 09/27/2021   TRIG 80 09/27/2021   CHOLHDL 2.5 09/27/2021   Lab Results  Component Value Date   VD25OH 47.7 09/27/2021   VD25OH 55.9 02/07/2021   VD25OH 44.7 06/29/2020   Lab Results  Component Value Date   WBC 9.3 05/16/2021   HGB 11.5 (L) 05/16/2021   HCT 34.5 (L) 05/16/2021   MCV 91.5 05/16/2021   PLT 148 (L) 05/16/2021   No results found for: "IRON", "TIBC", "FERRITIN"  Attestation Statements:   Reviewed by clinician on day of visit: allergies, medications, problem list, medical history, surgical history, family history, social history, and previous encounter notes.  Time spent on visit including pre-visit chart review and post-visit care and charting was 40 minutes.   05/18/2021, am acting as transcriptionist for [name], DO.   I have reviewed the above documentation for accuracy and completeness, and I agree with the above. [employer], D.O.  The 21st Century Cures Act was signed into law in 2016 which includes the topic of electronic health records.   This provides immediate access to information in MyChart.  This includes consultation notes, operative notes, office notes, lab results and pathology reports.  If you have any questions about what you read please let 2017 know at your next visit so we can discuss your concerns and take corrective action if need be.  We are right here with you.

## 2021-11-13 ENCOUNTER — Ambulatory Visit: Payer: Medicare Other | Admitting: Physician Assistant

## 2021-11-13 ENCOUNTER — Encounter: Payer: Self-pay | Admitting: Physician Assistant

## 2021-11-13 DIAGNOSIS — M1711 Unilateral primary osteoarthritis, right knee: Secondary | ICD-10-CM | POA: Diagnosis not present

## 2021-11-13 DIAGNOSIS — M1712 Unilateral primary osteoarthritis, left knee: Secondary | ICD-10-CM

## 2021-11-13 DIAGNOSIS — S7002XA Contusion of left hip, initial encounter: Secondary | ICD-10-CM

## 2021-11-13 DIAGNOSIS — S7012XA Contusion of left thigh, initial encounter: Secondary | ICD-10-CM | POA: Diagnosis not present

## 2021-11-13 MED ORDER — LIDOCAINE HCL 1 % IJ SOLN
3.0000 mL | INTRAMUSCULAR | Status: AC | PRN
  Administered 2021-11-13: 3 mL

## 2021-11-13 MED ORDER — METHYLPREDNISOLONE ACETATE 40 MG/ML IJ SUSP
40.0000 mg | INTRAMUSCULAR | Status: AC | PRN
  Administered 2021-11-13: 40 mg via INTRA_ARTICULAR

## 2021-11-13 NOTE — Progress Notes (Signed)
Office Visit Note   [name]           Date of Birth: December 25, 1945           MRN: [id number] Visit Date: 11/13/2021              Requested by: Melida Quitter, MD [address] [place],  [place] [address] PCP: Melida Quitter, MD   Assessment & Plan: Visit Diagnoses:  1. Contusion of hip and thigh, left, initial encounter   2. Primary osteoarthritis of left knee   3. Primary osteoarthritis of right knee     Plan: Recommend activities as tolerated in regards to the left hip.  Also recommend heat over the area.  She will follow-up with [name] if her pain becomes worse.  I did discuss with her that this most likely represents a contusion and may take several months to totally resolve.  Regards to her knees she tolerated cortisone injections both knees today.  She is not interested in supplemental injections either knee.  She will follow-up with [name] as needed.  Follow-Up Instructions: Return if symptoms worsen or fail to improve.   Orders:  Orders Placed This Encounter  Procedures   Large Joint Inj   No orders of the defined types were placed in this encounter.     Procedures: Large Joint Inj: bilateral knee on 11/13/2021 11:54 AM Indications: pain Details: 22 G 1.5 in needle, anterolateral approach  Arthrogram: No  Medications (Right): 3 mL lidocaine 1 %; 40 mg methylPREDNISolone acetate 40 MG/ML Medications (Left): 3 mL lidocaine 1 %; 40 mg methylPREDNISolone acetate 40 MG/ML Outcome: tolerated well, no immediate complications Procedure, treatment alternatives, risks and benefits explained, specific risks discussed. Consent was given by the patient. Immediately prior to procedure a time out was called to verify the correct patient, procedure, equipment, support staff and site/side marked as required. Patient was prepped and draped in the usual sterile fashion.       Clinical Data: No additional findings.   Subjective: Chief Complaint  Patient presents with    Right Knee - Pain   Left Knee - Pain    HPI Stool he comes in today for follow-up bilateral knee pain.  Last injections on 07/31/2021 gave her good relief for about 2-1/2 months.  She is asking for repeat injections both knees.  She had no adverse effects to the injections.  She has had no new injury to either knee.  However she did have a fall last Thursday fell hard onto her left hip and since that time has had pain lateral left hip.  She was seen at [facility] where radiographs were obtained.  She brings with her with this note was to have left hip radiographs on however the disc is empty.  She states that x-rays were read as negative for fracture.  She is able to tolerate bearing weight on the left hip but has pain that is now rating down the left hip to the mid thigh laterally.  She notes she has a large bruise over this area.  She denies any groin pain.  She is using a cane to ambulate.  Denies any other injuries.  Denies any shortness of breath chest pain or dizziness at the time of the fall.  She states this was a mechanical fall where she just failed to lift her foot up. Review of Systems See HPI.  Objective: Vital Signs: There were no vitals taken for this visit.  Physical Exam  Constitutional:      Appearance: She is not ill-appearing or diaphoretic.  Pulmonary:     Effort: Pulmonary effort is normal.  Neurological:     Mental Status: She is alert and oriented to person, place, and time.  Psychiatric:        Mood and Affect: Mood normal.     Ortho Exam Bilateral hips excellent range of motion of both hips without pain.  Left hip large area of ecchymosis over the trochanteric region down to almost the mid thigh.  No abnormal warmth or erythema. Bilateral knees good range of motion patellofemoral crepitus.  Tenderness globally both knees. Specialty Comments:  No specialty comments available.  Imaging: No results found.   PMFS History: Patient Active Problem List    Diagnosis Date Noted   B12 deficiency due to diet 09/27/2021   SOB (shortness of breath) on exertion 09/27/2021   Chronic vertigo 09/27/2021   Other hyperlipidemia 08/28/2021   Vitamin D deficiency 08/28/2021   Pre-diabetes 06/28/2021   Sepsis (HCC) 05/15/2021   Hypoxia 05/15/2021   Essential hypertension 05/15/2021   DM (diabetes mellitus), type 2 (HCC) 05/15/2021   Hypomagnesemia 05/15/2021   UTI (urinary tract infection) 05/14/2021   Acute exacerbation of chronic low back pain 03/10/2021   OSA on CPAP 09/03/2018   Mild persistent asthma without complication 09/03/2018   Class 3 severe obesity with serious comorbidity and body mass index (BMI) of 40.0 to 44.9 in adult (HCC) 04/14/2018   Unilateral primary osteoarthritis, left knee 04/14/2018   Asthma in adult, mild intermittent, uncomplicated 02/26/2017   Allergic rhinitis 09/05/2013   Past Medical History:  Diagnosis Date   Anxiety    Arthritis    Asthma    Back pain    Bilateral swelling of feet    Constipation    Depression    Gallbladder problem    GERD (gastroesophageal reflux disease)    History of low potassium    Hypertension    Joint pain    Obesity    Palpitations    Shortness of breath    Sleep apnea     Family History  Problem Relation Age of Onset   Diabetes Mother    Heart disease Mother    Anxiety disorder Mother    Obesity Mother    Diabetes Father    Hypertension Father    Obesity Father     Past Surgical History:  Procedure Laterality Date   ABDOMINAL HYSTERECTOMY     APPENDECTOMY     CHOLECYSTECTOMY     KNEE SURGERY Left    SHOULDER SURGERY Right    Social History   Occupational History   Not on file  Tobacco Use   Smoking status: Never    Passive exposure: Never   Smokeless tobacco: Never  Vaping Use   Vaping Use: Never used  Substance and Sexual Activity   Alcohol use: No    Alcohol/week: 0.0 standard drinks of alcohol   Drug use: No   Sexual activity: Yes

## 2021-12-06 ENCOUNTER — Ambulatory Visit (INDEPENDENT_AMBULATORY_CARE_PROVIDER_SITE_OTHER): Payer: Medicare Other | Admitting: Family Medicine

## 2021-12-21 ENCOUNTER — Other Ambulatory Visit: Payer: Self-pay | Admitting: Physician Assistant

## 2021-12-21 MED ORDER — TRAMADOL HCL 50 MG PO TABS: 50.0000 mg | ORAL_TABLET | Freq: Four times a day (QID) | ORAL | 0 refills | Status: DC | PRN

## 2021-12-21 MED ORDER — MELOXICAM 7.5 MG PO TABS: 7.5000 mg | ORAL_TABLET | Freq: Every day | ORAL | 0 refills | Status: DC

## 2021-12-24 ENCOUNTER — Emergency Department (HOSPITAL_BASED_OUTPATIENT_CLINIC_OR_DEPARTMENT_OTHER)
Admission: EM | Admit: 2021-12-24 | Discharge: 2021-12-24 | Disposition: A | Discharge status: Home / Self Care | Payer: Medicare Other | Attending: Emergency Medicine | Admitting: Emergency Medicine

## 2021-12-24 ENCOUNTER — Emergency Department (HOSPITAL_BASED_OUTPATIENT_CLINIC_OR_DEPARTMENT_OTHER): Payer: Medicare Other

## 2021-12-24 ENCOUNTER — Other Ambulatory Visit: Payer: Self-pay

## 2021-12-24 ENCOUNTER — Encounter (HOSPITAL_BASED_OUTPATIENT_CLINIC_OR_DEPARTMENT_OTHER): Payer: Self-pay | Admitting: Emergency Medicine

## 2021-12-24 DIAGNOSIS — E119 Type 2 diabetes mellitus without complications: Secondary | ICD-10-CM | POA: Diagnosis not present

## 2021-12-24 DIAGNOSIS — J45909 Unspecified asthma, uncomplicated: Secondary | ICD-10-CM | POA: Diagnosis not present

## 2021-12-24 DIAGNOSIS — J101 Influenza due to other identified influenza virus with other respiratory manifestations: Secondary | ICD-10-CM | POA: Diagnosis not present

## 2021-12-24 DIAGNOSIS — Z1152 Encounter for screening for COVID-19: Secondary | ICD-10-CM | POA: Diagnosis not present

## 2021-12-24 DIAGNOSIS — I10 Essential (primary) hypertension: Secondary | ICD-10-CM | POA: Insufficient documentation

## 2021-12-24 DIAGNOSIS — Z7984 Long term (current) use of oral hypoglycemic drugs: Secondary | ICD-10-CM | POA: Diagnosis not present

## 2021-12-24 DIAGNOSIS — Z79899 Other long term (current) drug therapy: Secondary | ICD-10-CM | POA: Insufficient documentation

## 2021-12-24 DIAGNOSIS — Z7951 Long term (current) use of inhaled steroids: Secondary | ICD-10-CM | POA: Diagnosis not present

## 2021-12-24 DIAGNOSIS — R059 Cough, unspecified: Secondary | ICD-10-CM | POA: Diagnosis present

## 2021-12-24 LAB — RESP PANEL BY RT-PCR (RSV, FLU A&B, COVID)  RVPGX2
Influenza A by PCR: POSITIVE — AB
Influenza B by PCR: NEGATIVE
Resp Syncytial Virus by PCR: NEGATIVE
SARS Coronavirus 2 by RT PCR: NEGATIVE

## 2021-12-24 MED ORDER — FLUTICASONE PROPIONATE 50 MCG/ACT NA SUSP: 2.0000 | Freq: Every day | NASAL | 1 refills | Status: DC

## 2021-12-24 MED ORDER — PREDNISONE 10 MG PO TABS: 40.0000 mg | ORAL_TABLET | Freq: Every day | ORAL | 0 refills | Status: DC

## 2021-12-24 MED ORDER — BENZONATATE 100 MG PO CAPS: 100.0000 mg | ORAL_CAPSULE | Freq: Three times a day (TID) | ORAL | 0 refills | Status: DC

## 2021-12-24 NOTE — ED Provider Notes (Signed)
[facility] EMERGENCY DEPARTMENT Provider Note   CSN: [id number] Arrival date & time: 12/24/21  1030     History  Chief Complaint  Patient presents with   Cough    Crystal Jackson is a 76 y.o. female.   Cough   76 year old female presents emergency department with complaints of cough, nasal congestion as well as sore throat.  Patient states symptoms been present since Friday.  Describes cough as nonproductive in nature.  Has taken Mucinex as well as at home inhaler for symptoms of which has helped some.  Reports emergency department for evaluation.  Denies fever, chills, night sweats, abdominal pain, nausea, vomiting, urinary symptoms, change in bowel habits.  Past medical history significant for obesity, OSA on CPAP, hypertension, diabetes mellitus, GERD, asthma  Home Medications Prior to Admission medications   Medication Sig Start Date End Date Taking? Authorizing Provider  benzonatate (TESSALON) 100 MG capsule Take 1 capsule (100 mg total) by mouth every 8 (eight) hours. 12/24/21  Yes Dion Saucier A, PA  fluticasone (FLONASE) 50 MCG/ACT nasal spray Place 2 sprays into both nostrils daily. 12/24/21  Yes Dion Saucier A, PA  predniSONE (DELTASONE) 10 MG tablet Take 4 tablets (40 mg total) by mouth daily. 12/24/21  Yes Dion Saucier A, PA  albuterol (VENTOLIN HFA) 108 (90 Base) MCG/ACT inhaler Inhale 1-2 puffs into the lungs every 6 (six) hours as needed for wheezing or shortness of breath. 10/03/21   Mannam, Hart Robinsons, MD  Artificial Saliva (BIOTENE ORALBALANCE DRY MOUTH) GEL Use as directed 1 application. in the mouth or throat in the morning, at noon, in the evening, and at bedtime.    [provider]  Calcium Carbonate-Vitamin D 600-400 MG-UNIT tablet 1 po qd Patient taking differently: Take 1 tablet by mouth daily. 07/13/20   Opalski, Neoma Laming, DO  chlorpheniramine (CHLOR-TRIMETON) 4 MG tablet Take 1 tablet (4 mg total) by mouth every 6 (six)  hours. Patient taking differently: Take 4 mg by mouth every 6 (six) hours as needed for allergies. 09/09/15   Marshell Garfinkel, MD  clotrimazole-betamethasone (LOTRISONE) cream 1 application. daily as needed (eczema). 01/28/17   [provider]  diclofenac Sodium (VOLTAREN) 1 % GEL 2 g daily as needed (pain). 05/21/19   [provider]  Fluticasone Furoate (ARNUITY ELLIPTA) 200 MCG/ACT AEPB Inhale 1 puff into the lungs daily. 10/03/21   Mannam, Hart Robinsons, MD  Magnesium 500 MG CAPS Take 500 mg by mouth at bedtime.    [provider]  meloxicam (MOBIC) 7.5 MG tablet Take 1 tablet (7.5 mg total) by mouth daily. 12/21/21   Pete Pelt, PA-C  metFORMIN (GLUCOPHAGE) 500 MG tablet 1 po with lunch and 1 po with dinner 10/25/21   Opalski, Neoma Laming, DO  metoprolol tartrate (LOPRESSOR) 25 MG tablet Take 50 mg by mouth 2 (two) times daily. 01/27/15   [provider]  midodrine (PROAMATINE) 5 MG tablet Take 1 tablet (5 mg total) by mouth 3 (three) times daily with meals. 10/03/21   Nahser, Wonda Cheng, MD  Multiple Vitamin (MULTIVITAMIN) tablet Take 1 tablet by mouth daily.     [provider]  nitrofurantoin (MACRODANTIN) 100 MG capsule Take 100 mg by mouth 2 (two) times daily as needed. 09/25/21   [provider]  Omega-3 Fatty Acids (FISH OIL) 500 MG CAPS Take 500 mg by mouth daily.    [provider]  pantoprazole (PROTONIX) 40 MG tablet Take 40 mg by mouth 2 (two) times daily. 01/31/15   [provider]  Propylene Glycol (SYSTANE COMPLETE OP) Place 1 drop into both eyes See admin instructions. Take 5 times a day - Systane without preservatives    [provider]  spironolactone (ALDACTONE) 25 MG tablet Take 25 mg by mouth daily. Taking 1/2 pill daily 07/27/21   [provider]  SUPER B COMPLEX/C PO Take 1 tablet by mouth daily.    [provider]  traMADol (ULTRAM) 50 MG tablet Take 1 tablet (50 mg total) by mouth every 6  (six) hours as needed. 12/21/21 12/21/22  Kirtland Bouchard, PA-C  traZODone (DESYREL) 100 MG tablet Take 150 mg by mouth at bedtime.    [provider]  triamcinolone cream (KENALOG) 0.1 % Apply 1 application. topically daily as needed (itching). 12/04/19   [provider]  valACYclovir (VALTREX) 500 MG tablet Take 500 mg by mouth 2 (two) times daily. 10/04/21   [provider]  venlafaxine XR (EFFEXOR-XR) 75 MG 24 hr capsule 225 mg daily with breakfast. 07/12/17   [provider]      Allergies    Avapro [irbesartan], Dextromethorphan, Guaifenesin & derivatives, Valtrex [valacyclovir], Citalopram, Codeine, Duloxetine, Penicillin g, and Vilazodone    Review of Systems   Review of Systems  Respiratory:  Positive for cough.   All other systems reviewed and are negative.   Physical Exam Updated Vital Signs BP (!) 115/55 (BP Location: Left Arm)   Pulse 86   Temp 99.3 F (37.4 C) (Oral)   Resp 20   Ht 5\' 4"  (1.626 m)   Wt 98.9 kg   SpO2 93%   BMI 37.42 kg/m  Physical Exam Vitals and nursing note reviewed.  Constitutional:      General: She is not in acute distress.    Appearance: She is well-developed.  HENT:     Head: Normocephalic and atraumatic.     Mouth/Throat:     Comments: Mild posterior pharyngeal erythema noted.  Uvula midline and symmetric with phonation.  Tonsils are 1+ bilaterally with no obvious exudate.  No sublingual or submandibular swelling appreciated. Eyes:     Conjunctiva/sclera: Conjunctivae normal.  Cardiovascular:     Rate and Rhythm: Normal rate and regular rhythm.     Heart sounds: No murmur heard. Pulmonary:     Effort: Pulmonary effort is normal. No respiratory distress.     Breath sounds: Normal breath sounds. No stridor. No wheezing, rhonchi or rales.  Abdominal:     Palpations: Abdomen is soft.     Tenderness: There is no abdominal tenderness.  Musculoskeletal:        General: No swelling.     Cervical back:  Neck supple.     Right lower leg: No edema.     Left lower leg: No edema.  Skin:    General: Skin is warm and dry.     Capillary Refill: Capillary refill takes less than 2 seconds.  Neurological:     Mental Status: She is alert.  Psychiatric:        Mood and Affect: Mood normal.     ED Results / Procedures / Treatments   Labs (all labs ordered are listed, but only abnormal results are displayed) Labs Reviewed  RESP PANEL BY RT-PCR (RSV, FLU A&B, COVID)  RVPGX2 - Abnormal; Notable for the following components:      Result Value   Influenza A by PCR POSITIVE (*)    All other components within normal limits    EKG None  Radiology  DG Chest 2 View  Result Date: 12/24/2021 CLINICAL DATA:  cough EXAM: CHEST - 2 VIEW COMPARISON:  05/14/2021 FINDINGS: Cardiac silhouette is unremarkable. No pneumothorax or pleural effusion. The lungs are clear. The visualized skeletal structures are unremarkable. IMPRESSION: No acute cardiopulmonary process. Electronically Signed   By: Layla Maw M.D.   On: 12/24/2021 11:37    Procedures Procedures    Medications Ordered in ED Medications - No data to display  ED Course/ Medical Decision Making/ A&P                           Medical Decision Making Amount and/or Complexity of Data Reviewed Radiology: ordered.   This patient presents to the ED for concern of flulike symptoms, this involves an extensive number of treatment options, and is a complaint that carries with it a high risk of complications and morbidity.  The differential diagnosis includes influenza, COVID, RSV, pneumonia, sepsis, meningitis   Co morbidities that complicate the patient evaluation  See HPI   Additional history obtained:  Additional history obtained from EMR External records from outside source obtained and reviewed including hospital records   Lab Tests:  I Ordered, and personally interpreted labs.  The pertinent results include: Respiratory viral  panel positive for influenza A   Imaging Studies ordered:  I ordered imaging studies including chest x-ray I independently visualized and interpreted imaging which showed no acute cardiopulmonary abnormalities I agree with the radiologist interpretation   Cardiac Monitoring: / EKG:  The patient was maintained on a cardiac monitor.  I personally viewed and interpreted the cardiac monitored which showed an underlying rhythm of: Sinus rhythm   Consultations Obtained:  N/a   Problem List / ED Course / Critical interventions / Medication management  Influenza A Reevaluation of the patient showed that the patient stayed the same I have reviewed the patients home medicines and have made adjustments as needed   Social Determinants of Health:  Denies tobacco, illicit drug use.   Test / Admission - Considered:  Influenza A Vitals signs within normal range and stable throughout visit. Laboratory/imaging studies significant for: See above Patient symptoms is likely secondary to influenza A.  Patient not hypoxic or complaining of shortness of breath or chest pain.  Patient generally overall well-appearing.  Patient recommended symptomatic therapy at home with Tylenol/Motrin as needed for fever/body aches, daily antihistamine, Nasacort or Flonase, Tessalon Perles as needed for cough.  Patient recommended reevaluation by primary care in 3 to 5 days.  Treatment plan discussed at length with patient and she acknowledged understanding was agreeable to said plan. Worrisome signs and symptoms were discussed with the patient, and the patient acknowledged understanding to return to the ED if noticed. Patient was stable upon discharge.          Final Clinical Impression(s) / ED Diagnoses Final diagnoses:  Influenza A    Rx / DC Orders ED Discharge Orders          Ordered    predniSONE (DELTASONE) 10 MG tablet  Daily        12/24/21 1330    fluticasone (FLONASE) 50 MCG/ACT nasal  spray  Daily        12/24/21 1330    benzonatate (TESSALON) 100 MG capsule  Every 8 hours        12/24/21 1330              Peter Garter, [place] 12/24/21 1335  Fredia Sorrow, MD 12/25/21 [phone]

## 2021-12-24 NOTE — ED Notes (Signed)
ED Provider at bedside. 

## 2021-12-24 NOTE — ED Triage Notes (Signed)
Pt c/o dry cough onset Friday.

## 2021-12-24 NOTE — Discharge Instructions (Addendum)
Note the workup today was overall reassuring.  You tested positive for influenza A.  Recommend taking Tylenol/Motrin as needed for pain/fever.  Recommend symptomatic therapy with Flonase and help with nasal congestion and subsequently sore throat, daily antihistamine or allergy medicine as you are already taking, prednisone over the next 4 days as well as cough suppressant to use as needed.  Recommend follow-up with primary care for reevaluation of your symptoms.  Please do not hesitate to return to emergency department if the worrisome signs and symptoms we discussed become apparent.

## 2021-12-29 ENCOUNTER — Other Ambulatory Visit (INDEPENDENT_AMBULATORY_CARE_PROVIDER_SITE_OTHER): Payer: Self-pay | Admitting: Family Medicine

## 2021-12-29 DIAGNOSIS — R7303 Prediabetes: Secondary | ICD-10-CM

## 2022-01-02 ENCOUNTER — Other Ambulatory Visit (INDEPENDENT_AMBULATORY_CARE_PROVIDER_SITE_OTHER): Payer: Self-pay | Admitting: Family Medicine

## 2022-01-02 DIAGNOSIS — R7303 Prediabetes: Secondary | ICD-10-CM

## 2022-01-03 ENCOUNTER — Encounter (INDEPENDENT_AMBULATORY_CARE_PROVIDER_SITE_OTHER): Payer: Self-pay | Admitting: Family Medicine

## 2022-01-03 ENCOUNTER — Ambulatory Visit (INDEPENDENT_AMBULATORY_CARE_PROVIDER_SITE_OTHER): Payer: Medicare Other | Admitting: Family Medicine

## 2022-01-03 VITALS — BP 138/79 | HR 70 | Temp 97.6°F | Ht 64.0 in | Wt 217.0 lb

## 2022-01-03 DIAGNOSIS — Z6837 Body mass index (BMI) 37.0-37.9, adult: Secondary | ICD-10-CM | POA: Diagnosis not present

## 2022-01-03 DIAGNOSIS — R7303 Prediabetes: Secondary | ICD-10-CM

## 2022-01-03 DIAGNOSIS — E669 Obesity, unspecified: Secondary | ICD-10-CM | POA: Diagnosis not present

## 2022-01-03 MED ORDER — METFORMIN HCL 500 MG PO TABS: ORAL_TABLET | ORAL | 0 refills | Status: DC

## 2022-01-19 NOTE — Progress Notes (Unsigned)
Chief Complaint:   OBESITY Crystal Jackson is here to discuss her progress with her obesity treatment plan along with follow-up of her obesity related diagnoses. Crystal Jackson is on the Category 1 Plan and states she is following her eating plan approximately 50% of the time. Crystal Jackson states she is not exercising.  Today's visit was #: 81 Starting weight: 242 LBS Starting date: 07/09/2018 Today's weight: 217 LBS Today's date: 01/03/2022 Total lbs lost to date: 25 LBS Total lbs lost since last in-office visit: +1 LB  Interim History: Patient did great over the holidays with only 1 pound of weight gain since last office visit on 10/25/2021.  However, patient feels motivated today to do more and lose weight faster by exercise and sticking to the plan.  Subjective:   1. Pre-diabetes About 3 to 4 months ago, A1c was 5.7.  Patient is very worried about rechecking labs in the near future.  B12 was 961 about 3 to 4 months ago.  Assessment/Plan:  No orders of the defined types were placed in this encounter.   Medications Discontinued During This Encounter  Medication Reason   midodrine (PROAMATINE) 5 MG tablet Completed Course   predniSONE (DELTASONE) 10 MG tablet Completed Course   valACYclovir (VALTREX) 500 MG tablet Completed Course   metFORMIN (GLUCOPHAGE) 500 MG tablet Reorder     Meds ordered this encounter  Medications   metFORMIN (GLUCOPHAGE) 500 MG tablet    Sig: TAKE 1 TABLET BY MOUTH WITH LUNCH AND WITH DINNER    Dispense:  60 tablet    Refill:  0     1. Pre-diabetes Continue metformin at same dose.  Recheck A1c, magnesium, CMP at next office visit.  Continue PNP, increase exercise and walking more.  Refill- metFORMIN (GLUCOPHAGE) 500 MG tablet; TAKE 1 TABLET BY MOUTH WITH LUNCH AND WITH DINNER  Dispense: 60 tablet; Refill: 0  2. Obesity, current BMI 37.2 1.  Start walk at home on YouTube 15 to 20 minutes 3 days/week. 2.  Goal of 4-5 bottles of water per day.  Crystal Jackson is  currently in the action stage of change. As such, her goal is to continue with weight loss efforts. She has agreed to the Category 1 Plan with 8 ounces of protein at dinner..   Exercise goals: All adults should avoid inactivity. Some physical activity is better than none, and adults who participate in any amount of physical activity gain some health benefits.  Behavioral modification strategies: meal planning and cooking strategies, avoiding temptations, and planning for success.  Crystal Jackson has agreed to follow-up with our clinic in 4 weeks. She was informed of the importance of frequent follow-up visits to maximize her success with intensive lifestyle modifications for her multiple health conditions.   Objective:   Blood pressure 138/79, pulse 70, temperature 97.6 F (36.4 C), height 5\' 4"  (1.626 m), weight 217 lb (98.4 kg), SpO2 93 %. Body mass index is 37.25 kg/m.  General: Cooperative, alert, well developed, in no acute distress. HEENT: Conjunctivae and lids unremarkable. Cardiovascular: Regular rhythm.  Lungs: Normal work of breathing. Neurologic: No focal deficits.   Lab Results  Component Value Date   CREATININE 1.05 (H) 09/27/2021   BUN 21 09/27/2021   NA 137 09/27/2021   K 4.5 09/27/2021   CL 96 09/27/2021   CO2 23 09/27/2021   Lab Results  Component Value Date   ALT 22 09/27/2021   AST 24 09/27/2021   ALKPHOS 45 09/27/2021   BILITOT 0.4 09/27/2021  Lab Results  Component Value Date   HGBA1C 5.7 (H) 09/27/2021   HGBA1C 5.7 (H) 02/07/2021   HGBA1C 5.6 06/29/2020   HGBA1C 5.4 10/19/2019   HGBA1C 5.3 05/27/2019   Lab Results  Component Value Date   INSULIN 18.4 09/27/2021   INSULIN 17.5 02/07/2021   INSULIN 17.7 06/29/2020   INSULIN 22.6 10/19/2019   INSULIN 16.2 05/27/2019   Lab Results  Component Value Date   TSH 1.199 05/14/2021   Lab Results  Component Value Date   CHOL 149 09/27/2021   HDL 60 09/27/2021   LDLCALC 74 09/27/2021   TRIG 80  09/27/2021   CHOLHDL 2.5 09/27/2021   Lab Results  Component Value Date   VD25OH 47.7 09/27/2021   VD25OH 55.9 02/07/2021   VD25OH 44.7 06/29/2020   Lab Results  Component Value Date   WBC 9.3 05/16/2021   HGB 11.5 (L) 05/16/2021   HCT 34.5 (L) 05/16/2021   MCV 91.5 05/16/2021   PLT 148 (L) 05/16/2021   No results found for: "IRON", "TIBC", "FERRITIN"  Attestation Statements:   Reviewed by clinician on day of visit: allergies, medications, problem list, medical history, surgical history, family history, social history, and previous encounter notes.  I, Davy Pique, RMA, am acting as Location manager for [employer], DO.   I have reviewed the above documentation for accuracy and completeness, and I agree with the above. Crystal Jackson, D.O.  The [place] was signed into law in 2016 which includes the topic of electronic health records.  This provides immediate access to information in MyChart.  This includes consultation notes, operative notes, office notes, lab results and pathology reports.  If you have any questions about what you read please let us know at your next visit so we can discuss your concerns and take corrective action if need be.  We are right here with you.

## 2022-01-29 ENCOUNTER — Ambulatory Visit (INDEPENDENT_AMBULATORY_CARE_PROVIDER_SITE_OTHER): Payer: Medicare Other | Admitting: Family Medicine

## 2022-01-29 ENCOUNTER — Other Ambulatory Visit: Payer: Self-pay | Admitting: Physician Assistant

## 2022-01-29 ENCOUNTER — Encounter (INDEPENDENT_AMBULATORY_CARE_PROVIDER_SITE_OTHER): Payer: Self-pay | Admitting: Family Medicine

## 2022-01-29 VITALS — BP 152/87 | HR 71 | Temp 97.9°F | Ht 64.0 in | Wt 220.0 lb

## 2022-01-29 DIAGNOSIS — R7303 Prediabetes: Secondary | ICD-10-CM | POA: Diagnosis not present

## 2022-01-29 DIAGNOSIS — I10 Essential (primary) hypertension: Secondary | ICD-10-CM

## 2022-01-29 DIAGNOSIS — E559 Vitamin D deficiency, unspecified: Secondary | ICD-10-CM

## 2022-01-29 DIAGNOSIS — Z6837 Body mass index (BMI) 37.0-37.9, adult: Secondary | ICD-10-CM

## 2022-01-30 DIAGNOSIS — Z6837 Body mass index (BMI) 37.0-37.9, adult: Secondary | ICD-10-CM | POA: Insufficient documentation

## 2022-01-30 LAB — HEMOGLOBIN A1C
Est. average glucose Bld gHb Est-mCnc: 117 mg/dL
Hgb A1c MFr Bld: 5.7 % — ABNORMAL HIGH (ref 4.8–5.6)

## 2022-01-30 LAB — BASIC METABOLIC PANEL
BUN/Creatinine Ratio: 18 (ref 12–28)
BUN: 19 mg/dL (ref 8–27)
CO2: 25 mmol/L (ref 20–29)
Calcium: 9.6 mg/dL (ref 8.7–10.3)
Chloride: 97 mmol/L (ref 96–106)
Creatinine, Ser: 1.06 mg/dL — ABNORMAL HIGH (ref 0.57–1.00)
Glucose: 92 mg/dL (ref 70–99)
Potassium: 4.8 mmol/L (ref 3.5–5.2)
Sodium: 135 mmol/L (ref 134–144)
eGFR: 55 mL/min/{1.73_m2} — ABNORMAL LOW (ref >59)

## 2022-01-30 LAB — VITAMIN D 25 HYDROXY (VIT D DEFICIENCY, FRACTURES): Vit D, 25-Hydroxy: 50.7 ng/mL (ref 30.0–100.0)

## 2022-01-31 ENCOUNTER — Ambulatory Visit (INDEPENDENT_AMBULATORY_CARE_PROVIDER_SITE_OTHER): Payer: Medicare Other | Admitting: Family Medicine

## 2022-02-05 ENCOUNTER — Ambulatory Visit (INDEPENDENT_AMBULATORY_CARE_PROVIDER_SITE_OTHER): Payer: Medicare Other | Admitting: Physician Assistant

## 2022-02-05 ENCOUNTER — Ambulatory Visit (INDEPENDENT_AMBULATORY_CARE_PROVIDER_SITE_OTHER): Payer: Medicare Other

## 2022-02-05 ENCOUNTER — Encounter: Payer: Self-pay | Admitting: Physician Assistant

## 2022-02-05 DIAGNOSIS — M25512 Pain in left shoulder: Secondary | ICD-10-CM | POA: Diagnosis not present

## 2022-02-05 DIAGNOSIS — M545 Low back pain, unspecified: Secondary | ICD-10-CM | POA: Diagnosis not present

## 2022-02-05 MED ORDER — METHYLPREDNISOLONE ACETATE 40 MG/ML IJ SUSP
40.0000 mg | INTRAMUSCULAR | Status: AC | PRN
  Administered 2022-02-05: 40 mg via INTRA_ARTICULAR

## 2022-02-05 MED ORDER — LIDOCAINE HCL 1 % IJ SOLN
3.0000 mL | INTRAMUSCULAR | Status: AC | PRN
  Administered 2022-02-05: 3 mL

## 2022-02-05 NOTE — Progress Notes (Signed)
Office Visit Note   [name]           Date of Birth: 1945/04/13           MRN: [id number] Visit Date: 02/05/2022              Requested by: Michael Boston, MD [address] [place],  [place] [address] PCP: Michael Boston, MD   Assessment & Plan: Visit Diagnoses:  1. Acute pain of left shoulder   2. Acute left-sided low back pain without sciatica     Plan: Offered her formal therapy for her back she defers therefore we will have her do home exercises and handouts were given for back exercises.  She is also given exercises for her shoulder.  Will see her back in just 2 weeks see how she is doing in regards to the shoulder and back.  She can cancel the appointment if she is doing well.  Questions were encouraged and answered at length.  Follow-Up Instructions: Return in about 2 weeks (around 02/19/2022).   Orders:  Orders Placed This Encounter  Procedures   Large Joint Inj   XR Lumbar Spine 2-3 Views   XR Shoulder Left   No orders of the defined types were placed in this encounter.     Procedures: Large Joint Inj: L subacromial bursa on 02/05/2022 5:17 PM Indications: pain Details: 22 G 1.5 in needle, superior approach  Arthrogram: No  Medications: 3 mL lidocaine 1 %; 40 mg methylPREDNISolone acetate 40 MG/ML Outcome: tolerated well, no immediate complications Procedure, treatment alternatives, risks and benefits explained, specific risks discussed. Consent was given by the patient. Immediately prior to procedure a time out was called to verify the correct patient, procedure, equipment, support staff and site/side marked as required. Patient was prepped and draped in the usual sterile fashion.       Clinical Data: No additional findings.   Subjective: Chief Complaint  Patient presents with   Left Shoulder - Pain   Lower Back - Pain    HPI Mrs. Mostafa is well-known to Dr. Delilah Shan service comes in today with left shoulder pain and left lower back pain.  She  states that her pain in her low back and her shoulder began a month ago after she tripped in the driveway and landed in dirt.  She notes that her shoulder pain can be 10 out of 10 pain at worst.  She notes decreased range of motion of the shoulder.  She occasionally has some tingling down to the wrist but denies any neck pain.  Most of her pain is anterior aspect shoulder.  Shoulder pain can awaken her.  Low back pain is worse whenever she is ambulating or standing.  Pain 6-7 out of 10 pain at worst.  No waking pain no bowel or bladder dysfunction no radicular symptoms or saddle anesthesia.  She has tried tramadol which helps some with back and shoulder.  She is also tried heat rest and Mobic without any real relief.  She is diabetic and her hemoglobin A1c was last 5.7. Review of Systems Denies fevers or chills.  See HPI otherwise negative or noncontributory  Objective: Vital Signs: There were no vitals taken for this visit.  Physical Exam Constitutional:      Appearance: She is not ill-appearing or diaphoretic.  Pulmonary:     Effort: Pulmonary effort is normal.  Neurological:     Mental Status: She is alert and oriented to person, place, and time.  Psychiatric:        Mood and Affect: Mood normal.     Ortho Exam Bilateral shoulders she has 5-5 strength with external and internal rotation against resistance.  Positive impingement on the left negative on the right.  Liftoff test is equivocal secondary to pain on the left.  Right is negative.  Bicep strength 5 out of 5.  Triceps strength 5 out of 5. Lower extremities 5 out of 5 strength throughout lower extremities against resistance negative straight leg raise bilaterally.  She is able to fully flex through the lumbar spine and touch her toes.  Extension is without pain.  She has no pain in the paraspinous region on the right side and slight tenderness in the left lower paraspinous region.  Bilateral hips good range of motion without  pain. Specialty Comments:  No specialty comments available.  Imaging: XR Lumbar Spine 2-3 Views  Result Date: 02/05/2022 Lumbar spine grade 1 spondylolisthesis L4-5.  Lower lumbar facet changes.  No acute fractures or acute findings.  XR Shoulder Left  Result Date: 02/05/2022 Left shoulder 3 views shoulder is well located.  No acute fractures.  Glenohumeral joint is well-maintained.  No significant arthritic changes.    PMFS History: Patient Active Problem List   Diagnosis Date Noted   Morbid obesity ([place]) 01/30/2022   BMI 37.0-37.9, adult 01/30/2022   B12 deficiency due to diet 09/27/2021   SOB (shortness of breath) on exertion 09/27/2021   Chronic vertigo 09/27/2021   Other hyperlipidemia 08/28/2021   Vitamin D deficiency 08/28/2021   Pre-diabetes 06/28/2021   Sepsis ([place]) 05/15/2021   Hypoxia 05/15/2021   Essential hypertension 05/15/2021   DM (diabetes mellitus), type 2 ([place]) 05/15/2021   Hypomagnesemia 05/15/2021   UTI (urinary tract infection) 05/14/2021   Acute exacerbation of chronic low back pain 03/10/2021   OSA on CPAP 09/03/2018   Mild persistent asthma without complication 35/00/9381   Class 3 severe obesity with serious comorbidity and body mass index (BMI) of 40.0 to 44.9 in adult ([place]) 04/14/2018   Unilateral primary osteoarthritis, left knee 04/14/2018   Asthma in adult, mild intermittent, uncomplicated 82/99/3716   Allergic rhinitis 09/05/2013   Past Medical History:  Diagnosis Date   Anxiety    Arthritis    Asthma    Back pain    Bilateral swelling of feet    Constipation    Depression    Gallbladder problem    GERD (gastroesophageal reflux disease)    History of low potassium    Hypertension    Joint pain    Obesity    Palpitations    Shortness of breath    Sleep apnea     Family History  Problem Relation Age of Onset   Diabetes Mother    Heart disease Mother    Anxiety disorder Mother    Obesity Mother    Diabetes Father     Hypertension Father    Obesity Father     Past Surgical History:  Procedure Laterality Date   ABDOMINAL HYSTERECTOMY     APPENDECTOMY     CHOLECYSTECTOMY     KNEE SURGERY Left    SHOULDER SURGERY Right    Social History   Occupational History   Not on file  Tobacco Use   Smoking status: Never    Passive exposure: Never   Smokeless tobacco: Never  Vaping Use   Vaping Use: Never used  Substance and Sexual Activity   Alcohol use: No    Alcohol/week:  0.0 standard drinks of alcohol   Drug use: No   Sexual activity: Yes

## 2022-02-18 ENCOUNTER — Encounter: Payer: Self-pay | Admitting: Cardiovascular Disease

## 2022-02-18 NOTE — Progress Notes (Unsigned)
Cardiology Office Note:    Date:  02/19/2022   ID:  Crystal Jackson, DOB 01-24-1945, MRN [id number]  PCP:  Michael Boston, MD   [facility] Cardiologist:  Mertie Moores, MD     Referring MD: Michael Boston, MD   Chief Complaint  Patient presents with   orthostatic hypotension    History of Present Illness:    Seen with husband, Crystal Jackson is a 77 y.o. female with a hx of near syncope    Has multiple episodes of near syncope Typically when she is sitting and then stands up, starte walking  Vision gets blurry ,   Seems to be worsening over the past several months  Was on Doxyzosin - was recently stopped  She was on chlorthalidone but became hyponatremic. Started Spironolactone  But this did not change any of her symptoms.  Does not get any regular exercise  Had Covid earlier this year ,  she was having these symptoms prior to covid.   Has cut back on salt   Does not exercise ,     Feb. 19, 2024 Crystal Jackson is seen for follow up of her episodes of near syncope / orthostatic hypotension Feeling better, Has been walking some  Not nearly has dizzy as she was   Wt is 222 lbs  Acknowledges that she needs to work on more exercise, weight loss        Past Medical History:  Diagnosis Date   Anxiety    Arthritis    Asthma    Back pain    Bilateral swelling of feet    Constipation    Depression    Gallbladder problem    GERD (gastroesophageal reflux disease)    History of low potassium    Hypertension    Joint pain    Obesity    Palpitations    Shortness of breath    Sleep apnea     Past Surgical History:  Procedure Laterality Date   ABDOMINAL HYSTERECTOMY     APPENDECTOMY     CHOLECYSTECTOMY     KNEE SURGERY Left    SHOULDER SURGERY Right     Current Medications: Current Meds  Medication Sig   albuterol (VENTOLIN HFA) 108 (90 Base) MCG/ACT inhaler Inhale 1-2 puffs into the lungs every 6 (six) hours as needed for  wheezing or shortness of breath.   Artificial Saliva (BIOTENE ORALBALANCE DRY MOUTH) GEL Use as directed 1 application. in the mouth or throat in the morning, at noon, in the evening, and at bedtime.   Calcium Carbonate-Vitamin D 600-400 MG-UNIT tablet 1 po qd   chlorpheniramine (CHLOR-TRIMETON) 4 MG tablet Take 1 tablet (4 mg total) by mouth every 6 (six) hours. (Patient taking differently: Take 4 mg by mouth every 6 (six) hours as needed for allergies.)   clotrimazole-betamethasone (LOTRISONE) cream 1 application. daily as needed (eczema).   diclofenac Sodium (VOLTAREN) 1 % GEL 2 g daily as needed (pain).   fluticasone (FLONASE) 50 MCG/ACT nasal spray Place 2 sprays into both nostrils daily.   Fluticasone Furoate (ARNUITY ELLIPTA) 200 MCG/ACT AEPB Inhale 1 puff into the lungs daily.   Magnesium 500 MG CAPS Take 500 mg by mouth at bedtime.   metFORMIN (GLUCOPHAGE) 500 MG tablet TAKE 1 TABLET BY MOUTH WITH LUNCH AND WITH DINNER   metoprolol tartrate (LOPRESSOR) 25 MG tablet Take 50 mg by mouth 2 (two) times daily.   Multiple Vitamin (MULTIVITAMIN) tablet Take 1 tablet by  mouth daily.    nitrofurantoin (MACRODANTIN) 100 MG capsule Take 100 mg by mouth 2 (two) times daily as needed.   nitrofurantoin, macrocrystal-monohydrate, (MACROBID) 100 MG capsule Take 100 mg by mouth every 12 (twelve) hours.   Omega-3 Fatty Acids (FISH OIL) 500 MG CAPS Take 500 mg by mouth daily.   pantoprazole (PROTONIX) 40 MG tablet Take 40 mg by mouth 2 (two) times daily.   Propylene Glycol (SYSTANE COMPLETE OP) Place 1 drop into both eyes See admin instructions. Take 5 times a day - Systane without preservatives   spironolactone (ALDACTONE) 25 MG tablet Take 25 mg by mouth daily. Taking 1/2 pill daily   SUPER B COMPLEX/C PO Take 1 tablet by mouth daily.   traMADol (ULTRAM) 50 MG tablet Take 1 tablet (50 mg total) by mouth every 6 (six) hours as needed.   traZODone (DESYREL) 100 MG tablet Take 150 mg by mouth at bedtime.    triamcinolone cream (KENALOG) 0.1 % Apply 1 application. topically daily as needed (itching).   venlafaxine XR (EFFEXOR-XR) 75 MG 24 hr capsule 225 mg daily with breakfast.     Allergies:   Avapro [irbesartan], Dextromethorphan, Guaifenesin & derivatives, Valtrex [valacyclovir], Citalopram, Codeine, Duloxetine, Penicillin g, and Vilazodone   Social History   Socioeconomic History   Marital status: Married    Spouse name: Not on file   Number of children: Not on file   Years of education: Not on file   Highest education level: Not on file  Occupational History   Not on file  Tobacco Use   Smoking status: Never    Passive exposure: Never   Smokeless tobacco: Never  Vaping Use   Vaping Use: Never used  Substance and Sexual Activity   Alcohol use: No    Alcohol/week: 0.0 standard drinks of alcohol   Drug use: No   Sexual activity: Yes  Other Topics Concern   Not on file  Social History Narrative   Not on file   Social Determinants of Health   Financial Resource Strain: Not on file  Food Insecurity: Not on file  Transportation Needs: Not on file  Physical Activity: Not on file  Stress: Not on file  Social Connections: Not on file     Family History: The patient's family history includes Anxiety disorder in her mother; Diabetes in her father and mother; Heart disease in her mother; Hypertension in her father; Obesity in her father and mother.  ROS:   Please see the history of present illness.     All other systems reviewed and are negative.  EKGs/Labs/Other Studies Reviewed:    The following studies were reviewed today:   EKG:    Recent Labs: 05/14/2021: TSH 1.199 05/16/2021: Hemoglobin 11.5; Platelets 148 09/27/2021: ALT 22; Magnesium 2.0 01/29/2022: BUN 19; Creatinine, Ser 1.06; Potassium 4.8; Sodium 135  Recent Lipid Panel    Component Value Date/Time   CHOL 149 09/27/2021 1141   TRIG 80 09/27/2021 1141   HDL 60 09/27/2021 1141   CHOLHDL 2.5 09/27/2021 1141    LDLCALC 74 09/27/2021 1141     Risk Assessment/Calculations:                Physical Exam:    Physical Exam: Blood pressure 132/76, pulse 76, height 5' 4"$  (1.626 m), weight 222 lb 6.4 oz (100.9 kg), SpO2 97 %.       GEN:  Well nourished, mildly obese female  in no acute distress HEENT: Normal NECK: No JVD; No carotid  bruits LYMPHATICS: No lymphadenopathy CARDIAC: RRR , no murmurs, rubs, gallops RESPIRATORY:  Clear to auscultation without rales, wheezing or rhonchi  ABDOMEN: Soft, non-tender, non-distended MUSCULOSKELETAL:  No edema; No deformity  SKIN: Warm and dry NEUROLOGIC:  Alert and oriented x 3   ASSESSMENT:    1. Orthostatic hypotension     PLAN:       Orthostatic hypotension:    Crystal Jackson seems to be doing much better.  Is eating better, exercising a bit  Symptoms are better .   Will see her in a year            Medication Adjustments/Labs and Tests Ordered: Current medicines are reviewed at length with the patient today.  Concerns regarding medicines are outlined above.  No orders of the defined types were placed in this encounter.  No orders of the defined types were placed in this encounter.    Patient Instructions  Medication Instructions:  Your physician recommends that you continue on your current medications as directed. Please refer to the Current Medication list given to you today.  *If you need a refill on your cardiac medications before your next appointment, please call your pharmacy*   Lab Work: NONE If you have labs (blood work) drawn today and your tests are completely normal, you will receive your results only by: [place] (if you have MyChart) OR A paper copy in the mail If you have any lab test that is abnormal or we need to change your treatment, we will call you to review the results.   Testing/Procedures: NONE   Follow-Up: At [facility], you and your health needs are our priority.  As part  of our continuing mission to provide you with exceptional heart care, we have created designated Provider Care Teams.  These Care Teams include your primary Cardiologist (physician) and Advanced Practice Providers (APPs -  Physician Assistants and Nurse Practitioners) who all work together to provide you with the care you need, when you need it.  We recommend signing up for the patient portal called "MyChart".  Sign up information is provided on this After Visit Summary.  MyChart is used to connect with patients for Virtual Visits (Telemedicine).  Patients are able to view lab/test results, encounter notes, upcoming appointments, etc.  Non-urgent messages can be sent to your provider as well.   To learn more about what you can do with MyChart, go to [email or web address].    Your next appointment:   1 year(s)  Provider:   Mertie Moores, MD     Signed, Mertie Moores, MD  02/19/2022 1:34 PM    [place]

## 2022-02-19 ENCOUNTER — Encounter: Payer: Self-pay | Admitting: Cardiovascular Disease

## 2022-02-19 ENCOUNTER — Ambulatory Visit: Payer: Medicare Other | Attending: Cardiovascular Disease | Admitting: Cardiovascular Disease

## 2022-02-19 VITALS — BP 132/76 | HR 76 | Ht 64.0 in | Wt 222.4 lb

## 2022-02-19 DIAGNOSIS — I951 Orthostatic hypotension: Secondary | ICD-10-CM | POA: Diagnosis not present

## 2022-02-19 NOTE — Patient Instructions (Signed)
Medication Instructions:  Your physician recommends that you continue on your current medications as directed. Please refer to the Current Medication list given to you today.  *If you need a refill on your cardiac medications before your next appointment, please call your pharmacy*   Lab Work: NONE If you have labs (blood work) drawn today and your tests are completely normal, you will receive your results only by: [place] (if you have MyChart) OR A paper copy in the mail If you have any lab test that is abnormal or we need to change your treatment, we will call you to review the results.   Testing/Procedures: NONE   Follow-Up: At [facility], you and your health needs are our priority.  As part of our continuing mission to provide you with exceptional heart care, we have created designated Provider Care Teams.  These Care Teams include your primary Cardiologist (physician) and Advanced Practice Providers (APPs -  Physician Assistants and Nurse Practitioners) who all work together to provide you with the care you need, when you need it.  We recommend signing up for the patient portal called "MyChart".  Sign up information is provided on this After Visit Summary.  MyChart is used to connect with patients for Virtual Visits (Telemedicine).  Patients are able to view lab/test results, encounter notes, upcoming appointments, etc.  Non-urgent messages can be sent to your provider as well.   To learn more about what you can do with MyChart, go to [email or web address].    Your next appointment:   1 year(s)  Provider:   Mertie Moores, MD

## 2022-02-19 NOTE — Progress Notes (Unsigned)
Chief Complaint:   OBESITY Crystal Jackson is here to discuss her progress with her obesity treatment plan along with follow-up of her obesity related diagnoses. Crystal Jackson is on the Category 1 Plan and states she is following her eating plan approximately 10% of the time. Crystal Jackson states she is walking for 10 minutes 1 time per week.  Today's visit was #: 19 Starting weight: 242 lbs Starting date: 07/09/2018 Today's weight: 220 lbs Today's date: 01/29/2022 Total lbs lost to date: 22 Total lbs lost since last in-office visit: 0  Interim History: Crystal Jackson fell a couple of weeks ago, and she is now still with pain in her left shoulder and back is bothering her some as well. Hasn't been on the meal plan much due to the above stressors.   Subjective:   1. Essential hypertension Crystal Jackson is taking metoprolol and spironolactone. Missed doses of blood pressure medications last night. Asymptomatic with no concerns.   2. Prediabetes Crystal Jackson is taking metformin at lunch. Decreased cravings at dinner and less snacking. Makes better choices on the medicine. No side effects.   3. Vitamin D deficiency Crystal Jackson's last vit D level was 47.7 four months ago. Taking multivitamins and Ca+ with Vitamin D for her bones.   Assessment/Plan:   Orders Placed This Encounter  Procedures   Hemoglobin A1c   VITAMIN D 25 Hydroxy (Vit-D Deficiency, Fractures)   Basic Metabolic Panel (BMET)    Medications Discontinued During This Encounter  Medication Reason   benzonatate (TESSALON) 100 MG capsule Completed Course     No orders of the defined types were placed in this encounter.    1. Essential hypertension Blood pressure elevated a little today. No change in her chronic dizziness. Follow-up with PCP as scheduled and as needed for management.decrease salt and increase exercise/walking. Take medications regularly and if dizziness worsens, follow up with PCP and may need Neuro consult.   2. Prediabetes We will  check labs today. (B12 and magnesium within normal limits 4 months ago). Continue metformin and denies the need for a refills. Continue prudent nutritional plan and weight loss.   - Hemoglobin 123456 - Basic Metabolic Panel (BMET)  3. Vitamin D deficiency We will check labs today. Continue supplementation. Will follow-up and change treatment plans as needed pending results.   - VITAMIN D 25 Hydroxy (Vit-D Deficiency, Fractures)  4. Morbid obesity (HCC)-Starting BMI 41.54  5. BMI 37.0-37.9, adult Crystal Jackson is currently in the action stage of change. As such, her goal is to continue with weight loss efforts. She has agreed to the Category 1 Plan with 8 oz of protein at dinner and journal as needed.   Exercise goals: Older adults should follow the adult guidelines. When older adults cannot meet the adult guidelines, they should be as physically active as their abilities and conditions will allow. Increase walking to 2-3 days per week, even if at home doing "walk at home" [employer].   Behavioral modification strategies: increasing lean protein intake, decreasing simple carbohydrates, no skipping meals, and avoiding temptations.  Crystal Jackson has agreed to follow-up with our clinic in 3 to 4 weeks. She was informed of the importance of frequent follow-up visits to maximize her success with intensive lifestyle modifications for her multiple health conditions.   Crystal Jackson was informed we would discuss her lab results at her next visit unless there is a critical issue that needs to be addressed sooner. Crystal Jackson agreed to keep her next visit at the agreed upon time to discuss these  results.  Objective:   Blood pressure (!) 152/87, pulse 71, temperature 97.9 F (36.6 C), height 5' 4"$  (1.626 m), weight 220 lb (99.8 kg), SpO2 96 %. Body mass index is 37.76 kg/m.  General: Cooperative, alert, well developed, in no acute distress. HEENT: Conjunctivae and lids unremarkable. Cardiovascular: Regular rhythm.   Lungs: Normal work of breathing. Neurologic: No focal deficits.   Lab Results  Component Value Date   CREATININE 1.06 (H) 01/29/2022   BUN 19 01/29/2022   NA 135 01/29/2022   K 4.8 01/29/2022   CL 97 01/29/2022   CO2 25 01/29/2022   Lab Results  Component Value Date   ALT 22 09/27/2021   AST 24 09/27/2021   ALKPHOS 45 09/27/2021   BILITOT 0.4 09/27/2021   Lab Results  Component Value Date   HGBA1C 5.7 (H) 01/29/2022   HGBA1C 5.7 (H) 09/27/2021   HGBA1C 5.7 (H) 02/07/2021   HGBA1C 5.6 06/29/2020   HGBA1C 5.4 10/19/2019   Lab Results  Component Value Date   INSULIN 18.4 09/27/2021   INSULIN 17.5 02/07/2021   INSULIN 17.7 06/29/2020   INSULIN 22.6 10/19/2019   INSULIN 16.2 05/27/2019   Lab Results  Component Value Date   TSH 1.199 05/14/2021   Lab Results  Component Value Date   CHOL 149 09/27/2021   HDL 60 09/27/2021   LDLCALC 74 09/27/2021   TRIG 80 09/27/2021   CHOLHDL 2.5 09/27/2021   Lab Results  Component Value Date   VD25OH 50.7 01/29/2022   VD25OH 47.7 09/27/2021   VD25OH 55.9 02/07/2021   Lab Results  Component Value Date   WBC 9.3 05/16/2021   HGB 11.5 (L) 05/16/2021   HCT 34.5 (L) 05/16/2021   MCV 91.5 05/16/2021   PLT 148 (L) 05/16/2021   No results found for: "IRON", "TIBC", "FERRITIN"  Attestation Statements:   Reviewed by clinician on day of visit: allergies, medications, problem list, medical history, surgical history, family history, social history, and previous encounter notes.   Wilhemena Durie, am acting as transcriptionist for [employer], DO.  I have reviewed the above documentation for accuracy and completeness, and I agree with the above. Marjory Sneddon, D.O.  The [place] was signed into law in 2016 which includes the topic of electronic health records.  This provides immediate access to information in MyChart.  This includes consultation notes, operative notes, office notes, lab results and  pathology reports.  If you have any questions about what you read please let us know at your next visit so we can discuss your concerns and take corrective action if need be.  We are right here with you.

## 2022-02-24 ENCOUNTER — Other Ambulatory Visit (INDEPENDENT_AMBULATORY_CARE_PROVIDER_SITE_OTHER): Payer: Self-pay | Admitting: Family Medicine

## 2022-02-24 DIAGNOSIS — R7303 Prediabetes: Secondary | ICD-10-CM

## 2022-02-26 ENCOUNTER — Other Ambulatory Visit: Payer: Self-pay | Admitting: Orthopaedic Surgery

## 2022-02-26 ENCOUNTER — Ambulatory Visit (INDEPENDENT_AMBULATORY_CARE_PROVIDER_SITE_OTHER): Payer: Medicare Other | Admitting: Physician Assistant

## 2022-02-27 ENCOUNTER — Encounter (INDEPENDENT_AMBULATORY_CARE_PROVIDER_SITE_OTHER): Payer: Self-pay | Admitting: Family Medicine

## 2022-02-27 ENCOUNTER — Other Ambulatory Visit (INDEPENDENT_AMBULATORY_CARE_PROVIDER_SITE_OTHER): Payer: Self-pay | Admitting: Family Medicine

## 2022-02-27 ENCOUNTER — Ambulatory Visit (INDEPENDENT_AMBULATORY_CARE_PROVIDER_SITE_OTHER): Payer: Medicare Other | Admitting: Family Medicine

## 2022-02-27 VITALS — BP 128/74 | HR 67 | Temp 97.6°F | Ht 64.0 in | Wt 219.0 lb

## 2022-02-27 DIAGNOSIS — R7303 Prediabetes: Secondary | ICD-10-CM

## 2022-02-27 DIAGNOSIS — E669 Obesity, unspecified: Secondary | ICD-10-CM | POA: Diagnosis not present

## 2022-02-27 DIAGNOSIS — E559 Vitamin D deficiency, unspecified: Secondary | ICD-10-CM | POA: Diagnosis not present

## 2022-02-27 DIAGNOSIS — Z6837 Body mass index (BMI) 37.0-37.9, adult: Secondary | ICD-10-CM | POA: Diagnosis not present

## 2022-02-27 MED ORDER — METFORMIN HCL 500 MG PO TABS: ORAL_TABLET | ORAL | 0 refills | Status: DC

## 2022-02-27 NOTE — Progress Notes (Deleted)
Patient has been at a standstill for the last few years since the last time she has seen me in 2020.  She feels like she is struggling to stay compliant with meal plan.  She has been walking more and is hoping when the weather improves her mood will improve.  She has noticed in the last 4 years she is more compliant to eating on plan.  Biggest obstacle currently is cooking.  Her husband is fairly limited on foods he eats.  Next few weeks she has several doctors appointments.  She is hoping to go outside and do some yardwork.

## 2022-03-05 ENCOUNTER — Ambulatory Visit: Payer: Medicare Other | Admitting: Physician Assistant

## 2022-03-05 ENCOUNTER — Encounter: Payer: Self-pay | Admitting: Physician Assistant

## 2022-03-05 DIAGNOSIS — M1712 Unilateral primary osteoarthritis, left knee: Secondary | ICD-10-CM | POA: Diagnosis not present

## 2022-03-05 DIAGNOSIS — M545 Low back pain, unspecified: Secondary | ICD-10-CM

## 2022-03-05 DIAGNOSIS — G8929 Other chronic pain: Secondary | ICD-10-CM

## 2022-03-05 DIAGNOSIS — M1711 Unilateral primary osteoarthritis, right knee: Secondary | ICD-10-CM | POA: Diagnosis not present

## 2022-03-05 DIAGNOSIS — M25512 Pain in left shoulder: Secondary | ICD-10-CM | POA: Diagnosis not present

## 2022-03-05 MED ORDER — LIDOCAINE HCL 1 % IJ SOLN
3.0000 mL | INTRAMUSCULAR | Status: AC | PRN
  Administered 2022-03-05: 3 mL

## 2022-03-05 MED ORDER — METHYLPREDNISOLONE ACETATE 40 MG/ML IJ SUSP
40.0000 mg | INTRAMUSCULAR | Status: AC | PRN
  Administered 2022-03-05: 40 mg via INTRA_ARTICULAR

## 2022-03-05 NOTE — Progress Notes (Signed)
Office Visit Note   [name]           Date of Birth: February 21, 1945           MRN: [id number] Visit Date: 03/05/2022              Requested by: Michael Boston, MD [address] [place],   [id number] PCP: Michael Boston, MD   Assessment & Plan: Visit Diagnoses:  1. Acute pain of left shoulder   2. Primary osteoarthritis of right knee   3. Primary osteoarthritis of left knee   4. Chronic right-sided low back pain without sciatica     Plan: Will have her undergo an MRI of her left shoulder to rule out rotator cuff tear due to the fact that she has weakness on exam today and is failed conservative treatment with time exercises and subacromial injection.  Regards to her back recommend repeat right facet injection..  Will reevaluate her knee pain follow-up after the MRI of her left shoulder.  Will see her back 3 to 4 days after the MRI of the left shoulder to go over the results and discuss further treatment.  Questions were encouraged and answered at length  Follow-Up Instructions: Return for After MRI.   Orders:  Orders Placed This Encounter  Procedures   Large Joint Inj   No orders of the defined types were placed in this encounter.     Procedures: Large Joint Inj: bilateral knee on 03/05/2022 2:19 PM Indications: pain Details: 22 G 1.5 in needle, anterolateral approach  Arthrogram: No  Medications (Right): 3 mL lidocaine 1 %; 40 mg methylPREDNISolone acetate 40 MG/ML Medications (Left): 3 mL lidocaine 1 %; 40 mg methylPREDNISolone acetate 40 MG/ML Outcome: tolerated well, no immediate complications Procedure, treatment alternatives, risks and benefits explained, specific risks discussed. Consent was given by the patient. Immediately prior to procedure a time out was called to verify the correct patient, procedure, equipment, support staff and site/side marked as required. Patient was prepped and draped in the usual sterile fashion.       Clinical Data: No  additional findings.   Subjective: Chief Complaint  Patient presents with   Left Shoulder - Pain    HPI Crystal Jackson returns today 2 weeks status post left shoulder injection.  She states that her shoulder is about 50% better but she still having some pain in the shoulder.  She still having sharp pains in the shoulder.  No new injury to the shoulder. She is also having low back pain which is worse when she is standing for prolonged period of time.  She states the pain is similar to what she had in the past.  MRI of her back in 2020 showed facet joint effusions bilaterally at L4-5.  Degenerative PERI facet edema at L4-5 and L5-S1.  She underwent L4-5 articular injections with Dr. Ernestina Patches and did well for some time.  She is having no real radicular symptoms down either leg.  She does have some numbness tingling both hands and feet at times though. She also notes that she is having bilateral knee pain.  Left knee with moderate arthritis involving the lateral compartment and severe patellofemoral changes.  Right knee with moderate lateral compartmental narrowing and severe patellofemoral changes.  She states she has had no new injuries to either knee.  Cortisone injection in the past given some relief. She is prediabetic but states her hemoglobin A1c has never been above 5.5.  Review of Systems  Negative for fevers chills.  Objective: Vital Signs: There were no vitals taken for this visit.  Physical Exam Constitutional:      Appearance: She is not ill-appearing or diaphoretic.  Pulmonary:     Effort: Pulmonary effort is normal.  Neurological:     Mental Status: She is alert and oriented to person, place, and time.  Psychiatric:        Mood and Affect: Mood normal.     Ortho Exam Bilateral knees good range of motion both knees.  No instability valgus varus stressing.  Mild valgus deformity both knees.  No effusion of bilateral knees. Bilateral shoulders: 5 out of 5 strength with internal  rotation against resistance.  Left shoulder weakness with external rotation against resistance.  Empty can test is negative on the right positive on the left.  Liftoff test weak on the lef.  Negative on the right.  Impingement testing positive on the left negative on the right. Specialty Comments:  No specialty comments available.  Imaging: No results found.   PMFS History: Patient Active Problem List   Diagnosis Date Noted   Morbid obesity ([place]) 01/30/2022   BMI 37.0-37.9, adult 01/30/2022   B12 deficiency due to diet 09/27/2021   SOB (shortness of breath) on exertion 09/27/2021   Chronic vertigo 09/27/2021   Other hyperlipidemia 08/28/2021   Vitamin D deficiency 08/28/2021   Pre-diabetes 06/28/2021   Sepsis ([place]) 05/15/2021   Hypoxia 05/15/2021   Essential hypertension 05/15/2021   DM (diabetes mellitus), type 2 ([place]) 05/15/2021   Hypomagnesemia 05/15/2021   UTI (urinary tract infection) 05/14/2021   Acute exacerbation of chronic low back pain 03/10/2021   OSA on CPAP 09/03/2018   Mild persistent asthma without complication 99991111   Class 3 severe obesity with serious comorbidity and body mass index (BMI) of 40.0 to 44.9 in adult ([place]) 04/14/2018   Unilateral primary osteoarthritis, left knee 04/14/2018   Asthma in adult, mild intermittent, uncomplicated 0000000   Allergic rhinitis 09/05/2013   Past Medical History:  Diagnosis Date   Anxiety    Arthritis    Asthma    Back pain    Bilateral swelling of feet    Constipation    Depression    Gallbladder problem    GERD (gastroesophageal reflux disease)    History of low potassium    Hypertension    Joint pain    Obesity    Palpitations    Shortness of breath    Sleep apnea     Family History  Problem Relation Age of Onset   Diabetes Mother    Heart disease Mother    Anxiety disorder Mother    Obesity Mother    Diabetes Father    Hypertension Father    Obesity Father     Past Surgical History:   Procedure Laterality Date   ABDOMINAL HYSTERECTOMY     APPENDECTOMY     CHOLECYSTECTOMY     KNEE SURGERY Left    SHOULDER SURGERY Right    Social History   Occupational History   Not on file  Tobacco Use   Smoking status: Never    Passive exposure: Never   Smokeless tobacco: Never  Vaping Use   Vaping Use: Never used  Substance and Sexual Activity   Alcohol use: No    Alcohol/week: 0.0 standard drinks of alcohol   Drug use: No   Sexual activity: Yes

## 2022-03-06 NOTE — Addendum Note (Signed)
Addended by: Robyne Peers on: 03/06/2022 08:12 AM   Modules accepted: Orders

## 2022-03-07 DIAGNOSIS — G8929 Other chronic pain: Secondary | ICD-10-CM

## 2022-03-08 ENCOUNTER — Encounter: Payer: Self-pay | Admitting: Radiology

## 2022-03-08 ENCOUNTER — Other Ambulatory Visit: Payer: Self-pay | Admitting: Physical Medicine and Rehabilitation

## 2022-03-13 NOTE — Progress Notes (Signed)
Chief Complaint:   OBESITY Crystal Jackson is here to discuss her progress with her obesity treatment plan along with follow-up of her obesity related diagnoses. Crystal Jackson is on the Category 1 Plan with 8 ounces of protein at dinner and states she is following her eating plan approximately 30% of the time. Crystal Jackson states she is walking 15 minutes 3 times per week.  Today's visit was #: 64 Starting weight: 79 LBS Starting date: 07/09/2018 Today's weight: 219 LBS Today's date: 02/27/2022 Total lbs lost to date: 23 lbs Total lbs lost since last in-office visit: 1 lb  Interim History:  Patient has been at a standstill for the last few years since the last time she has seen me in 2020.  She feels like she is struggling to stay compliant with meal plan.  She has been walking more and is hoping when the weather improves her mood will improve.  She has noticed in the last 4 years she is more compliant to eating on plan.  Biggest obstacle currently is cooking.  Her husband is fairly limited on foods he eats.  Next few weeks she has several doctors appointments.  She is hoping to go outside and do some yardwork.    Subjective:   1. Pre-diabetes Discussed labs with patient today. Patient is on metformin twice daily.  No GI side effects noted on medications.  Last A1c was 5.7, still elevated.  Will  2. Vitamin D deficiency Discussed labs with patient today. Patient previously on vitamin D.  Patient denies nausea, vomiting, muscle weakness, but is positive for fatigue.  Assessment/Plan:   1. Pre-diabetes Refill metformin 500 mg p.o. daily, #60 no refills.  2. Vitamin D deficiency Patient encouraged to take OTC vitamin D.  3. BMI 37.0-37.9, adult  4. Obesity with starting BMI of 41.5 Crystal Jackson is currently in the action stage of change. As such, her goal is to continue with weight loss efforts. She has agreed to keeping a food journal and adhering to recommended goals of 1150-1250 calories and 85+  protein daily..   Exercise goals: All adults should avoid inactivity. Some physical activity is better than none, and adults who participate in any amount of physical activity gain some health benefits.  Behavioral modification strategies: increasing lean protein intake, meal planning and cooking strategies, keeping healthy foods in the home, planning for success, and keeping a strict food journal.  Crystal Jackson has agreed to follow-up with our clinic in 4 weeks. She was informed of the importance of frequent follow-up visits to maximize her success with intensive lifestyle modifications for her multiple health conditions.   Objective:   Blood pressure 128/74, pulse 67, temperature 97.6 F (36.4 C), height '5\' 4"'$  (1.626 m), weight 219 lb (99.3 kg), SpO2 97 %. Body mass index is 37.59 kg/m.  General: Cooperative, alert, well developed, in no acute distress. HEENT: Conjunctivae and lids unremarkable. Cardiovascular: Regular rhythm.  Lungs: Normal work of breathing. Neurologic: No focal deficits.   Lab Results  Component Value Date   CREATININE 1.06 (H) 01/29/2022   BUN 19 01/29/2022   NA 135 01/29/2022   K 4.8 01/29/2022   CL 97 01/29/2022   CO2 25 01/29/2022   Lab Results  Component Value Date   ALT 22 09/27/2021   AST 24 09/27/2021   ALKPHOS 45 09/27/2021   BILITOT 0.4 09/27/2021   Lab Results  Component Value Date   HGBA1C 5.7 (H) 01/29/2022   HGBA1C 5.7 (H) 09/27/2021   HGBA1C 5.7 (  H) 02/07/2021   HGBA1C 5.6 06/29/2020   HGBA1C 5.4 10/19/2019   Lab Results  Component Value Date   INSULIN 18.4 09/27/2021   INSULIN 17.5 02/07/2021   INSULIN 17.7 06/29/2020   INSULIN 22.6 10/19/2019   INSULIN 16.2 05/27/2019   Lab Results  Component Value Date   TSH 1.199 05/14/2021   Lab Results  Component Value Date   CHOL 149 09/27/2021   HDL 60 09/27/2021   LDLCALC 74 09/27/2021   TRIG 80 09/27/2021   CHOLHDL 2.5 09/27/2021   Lab Results  Component Value Date   VD25OH  50.7 01/29/2022   VD25OH 47.7 09/27/2021   VD25OH 55.9 02/07/2021   Lab Results  Component Value Date   WBC 9.3 05/16/2021   HGB 11.5 (L) 05/16/2021   HCT 34.5 (L) 05/16/2021   MCV 91.5 05/16/2021   PLT 148 (L) 05/16/2021   No results found for: "IRON", "TIBC", "FERRITIN"  Attestation Statements:   Reviewed by clinician on day of visit: allergies, medications, problem list, medical history, surgical history, family history, social history, and previous encounter notes.  I, Davy Pique, RMA, am acting as transcriptionist for Coralie Common, MD.  I have reviewed the above documentation for accuracy and completeness, and I agree with the above. - Coralie Common, MD

## 2022-03-20 ENCOUNTER — Ambulatory Visit (INDEPENDENT_AMBULATORY_CARE_PROVIDER_SITE_OTHER): Payer: Medicare Other | Admitting: Family Medicine

## 2022-03-20 ENCOUNTER — Encounter (INDEPENDENT_AMBULATORY_CARE_PROVIDER_SITE_OTHER): Payer: Self-pay | Admitting: Family Medicine

## 2022-03-20 VITALS — BP 126/77 | HR 71 | Temp 97.9°F | Ht 64.0 in | Wt 217.2 lb

## 2022-03-20 DIAGNOSIS — M159 Polyosteoarthritis, unspecified: Secondary | ICD-10-CM | POA: Diagnosis not present

## 2022-03-20 DIAGNOSIS — Z6837 Body mass index (BMI) 37.0-37.9, adult: Secondary | ICD-10-CM

## 2022-03-20 DIAGNOSIS — R7303 Prediabetes: Secondary | ICD-10-CM

## 2022-03-20 DIAGNOSIS — Z6841 Body Mass Index (BMI) 40.0 and over, adult: Secondary | ICD-10-CM

## 2022-03-20 DIAGNOSIS — E559 Vitamin D deficiency, unspecified: Secondary | ICD-10-CM

## 2022-03-20 NOTE — Progress Notes (Signed)
Crystal Jackson, D.O.  ABFM, ABOM Specializing in Clinical Bariatric Medicine  Office located at: [address], [place]  [address]     Assessment and Plan:   No orders of the defined types were placed in this encounter.   There are no discontinued medications.   No orders of the defined types were placed in this encounter.  Morbid obesity (HCC)-start bmi 41.54/date 07/09/2018 BMI 40.0-44.9, adult (HCC)-current bmi 37.3 Assessment: Condition is Not at goal.. Biometric data collected today, was reviewed with patient.  Fat mass has decreased by 4.3lbs. Muscle mass has increased by 1.4lbs. Total body water has decreased by 3.8lbs.   Plan: Continue with keeping a food journal and adhering to recommended goals of 1150-1250 calories and 85 protein.    Vitamin D deficiency Assessment: Condition is At goal.. Labs were reviewed.  Lab Results  Component Value Date   VD25OH 50.7 01/29/2022   VD25OH 47.7 09/27/2021   VD25OH 55.9 02/07/2021  She is not taking any Vitamin D supplements. This condition is managed with nutrition and exercise.  Plan:  Crystal Jackson will continue with meal plan and their weight loss efforts to further improve this condition.  Thus, we will need to monitor levels regularly (every 3-4 mo on average) to keep levels within normal limits and prevent over supplementation.   Pre-diabetes Assessment: Condition is Improving, but not optimized.. Labs were reviewed.  Lab Results  Component Value Date   HGBA1C 5.7 (H) 01/29/2022   HGBA1C 5.7 (H) 09/27/2021   HGBA1C 5.7 (H) 02/07/2021   INSULIN 18.4 09/27/2021   INSULIN 17.5 02/07/2021   INSULIN 17.7 06/29/2020  Patient is compliant with Metformin 500mg  BID, denies any side effects. She reports that the metformin is helping her with cravings.   Plan: Continue with Metformin 500mg  BID.  - Crystal Jackson will continue to work on weight loss, exercise, via their meal plan we devised to help decrease the risk of  progressing to diabetes.  - We will recheck A1c and fasting insulin level in approximately 3 months from last check, or as deemed appropriate.     Generalized Osteoarthritis  Assessment: Condition is Improving, but not optimized.. Labs were reviewed.  Lab Results  Component Value Date   CREATININE 1.06 (H) 01/29/2022  For her osteoarthritis pain, she takes either Advil, Aleve, or Tramadol daily. These medications are likely causing her serum creatine levels to increase. She recently had shots in each of her knees.   Plan: I recommend patient discontinue Advil, Aleve, or Tramadol given her elevated creatinine. I encouraged her to follow up with her orthopedist for further pain management recommendations.   TREATMENT PLAN FOR OBESITY:  Recommended Dietary Goals Crystal Jackson is currently in the action stage of change. As such, her goal is to continue weight management plan. She has agreed to continue keeping a food journal and adhering to recommended goals of 1150-1250 calories and 85 protein.  Behavioral Intervention Additional resources provided today: patient declined Evidence-based interventions for health behavior change were utilized today including the discussion of self monitoring techniques, problem-solving barriers and SMART goal setting techniques.   Regarding patient's less desirable eating habits and patterns, we employed the technique of small changes.  Pt will specifically work on: planning/preparing her meals better   Recommended Physical Activity Goals Crystal Jackson has been advised to work up to 150 minutes of moderate intensity aerobic activity a week and strengthening exercises 2-3 times per week for cardiovascular health, weight loss maintenance and preservation of muscle  mass.  She has agreed to Continue current level of physical activity   FOLLOW UP: Return in about 6 weeks (around 05/01/2022). She was informed of the importance of frequent follow up visits to maximize her  success with intensive lifestyle modifications for her multiple health conditions.  Subjective:   Chief complaint: Obesity Crystal Jackson is here to discuss her progress with her obesity treatment plan. She is on the keeping a food journal and adhering to recommended goals of 1150-1250 calories and 85 protein and states she is following her eating plan approximately 25% of the time. She states she is not exercising.   Interval History:  Crystal Jackson is here for a follow up office visit. She has not been journaling her food and she sometimes skips lunch. We reviewed her meal plan and all questions were answered. Patient's food recall appears to be accurate and consistent with what is on plan when she is following it. When eating on plan, her hunger and cravings are well controlled.    Review of Systems:  Pertinent positives were addressed with patient today.  Weight Summary and Biometrics   Weight Lost Since Last Visit: 2lb  Weight Gained Since Last Visit: 0  Vitals Temp: 97.9 F (36.6 C) BP: 126/77 Pulse Rate: 71 SpO2: 98 %   Anthropometric Measurements Height: 5\' 4"  (1.626 m) Weight: 217 lb 3.2 oz (98.5 kg) BMI (Calculated): 37.26 Weight at Last Visit: 219lb Weight Lost Since Last Visit: 2lb Weight Gained Since Last Visit: 0 Starting Weight: 242lb Total Weight Loss (lbs): 25 lb (11.3 kg) Peak Weight: 242lb   Body Composition  Body Fat %: 46.3 % Fat Mass (lbs): 100.6 lbs Muscle Mass (lbs): 110.8 lbs Total Body Water (lbs): 76.8 lbs Visceral Fat Rating : 16   Other Clinical Data Fasting: no Labs: no Today's Visit #: 67 Starting Date: 07/09/18    Objective:   PHYSICAL EXAM:  Blood pressure 126/77, pulse 71, temperature 97.9 F (36.6 C), height 5\' 4"  (1.626 m), weight 217 lb 3.2 oz (98.5 kg), SpO2 98 %. Body mass index is 37.28 kg/m.  General: Well Developed, well nourished, and in no acute distress.  HEENT: Normocephalic, atraumatic Skin: Warm and dry, cap RF  less 2 sec, good turgor Chest:  Normal excursion, shape, no gross abn Respiratory: speaking in full sentences, no conversational dyspnea NeuroM-Sk: Ambulates w/o assistance, moves * 4 Psych: A and O *3, insight good, mood-full  DIAGNOSTIC DATA REVIEWED:  BMET    Component Value Date/Time   NA 135 01/29/2022 1304   K 4.8 01/29/2022 1304   CL 97 01/29/2022 1304   CO2 25 01/29/2022 1304   GLUCOSE 92 01/29/2022 1304   GLUCOSE 113 (H) 05/16/2021 0541   BUN 19 01/29/2022 1304   CREATININE 1.06 (H) 01/29/2022 1304   CALCIUM 9.6 01/29/2022 1304   GFRNONAA >60 05/16/2021 0541   GFRAA 62 10/19/2019 0951   Lab Results  Component Value Date   HGBA1C 5.7 (H) 01/29/2022   HGBA1C 5.8 (H) 07/09/2018   Lab Results  Component Value Date   INSULIN 18.4 09/27/2021   INSULIN 25.0 (H) 07/09/2018   Lab Results  Component Value Date   TSH 1.199 05/14/2021   CBC    Component Value Date/Time   WBC 9.3 05/16/2021 0919   RBC 3.77 (L) 05/16/2021 0919   HGB 11.5 (L) 05/16/2021 0919   HGB 13.6 07/09/2018 1609   HCT 34.5 (L) 05/16/2021 0919   HCT 39.7 07/09/2018 1609   PLT 148 (  L) 05/16/2021 0919   PLT 241 07/09/2018 1609   MCV 91.5 05/16/2021 0919   MCV 89 07/09/2018 1609   MCH 30.5 05/16/2021 0919   MCHC 33.3 05/16/2021 0919   RDW 13.2 05/16/2021 0919   RDW 13.1 07/09/2018 1609   Iron Studies No results found for: "IRON", "TIBC", "FERRITIN", "IRONPCTSAT" Lipid Panel     Component Value Date/Time   CHOL 149 09/27/2021 1141   TRIG 80 09/27/2021 1141   HDL 60 09/27/2021 1141   CHOLHDL 2.5 09/27/2021 1141   LDLCALC 74 09/27/2021 1141   Hepatic Function Panel     Component Value Date/Time   PROT 7.0 09/27/2021 1141   ALBUMIN 4.5 09/27/2021 1141   AST 24 09/27/2021 1141   ALT 22 09/27/2021 1141   ALKPHOS 45 09/27/2021 1141   BILITOT 0.4 09/27/2021 1141   BILIDIR 0.1 05/14/2021 2238   IBILI 0.4 05/14/2021 2238      Component Value Date/Time   TSH 1.199 05/14/2021 2238    TSH 2.210 07/09/2018 1609   Nutritional Lab Results  Component Value Date   VD25OH 50.7 01/29/2022   VD25OH 47.7 09/27/2021   VD25OH 55.9 02/07/2021    Attestations:   Reviewed by clinician on day of visit: allergies, medications, problem list, medical history, surgical history, family history, social history, and previous encounter notes.   Patient was in the office today and time spent on visit including pre-visit chart review and post-visit care/coordination of care and electronic medical record documentation was 30 minutes. 50% of the time was in face to face counseling of this patient's medical condition(s) and providing education on treatment options to include the first-line treatment of diet and lifestyle modification.   I,Special Puri,acting as a Education administrator for [employer], DO.,have documented all relevant documentation on the behalf of Mellody Dance, DO,as directed by  Mellody Dance, DO while in the presence of Mellody Dance, DO.   I, Mellody Dance, DO, have reviewed all documentation for this visit. The documentation on 03/20/22 for the exam, diagnosis, procedures, and orders are all accurate and complete.

## 2022-03-23 ENCOUNTER — Telehealth: Payer: Self-pay | Admitting: Physical Medicine and Rehabilitation

## 2022-03-23 NOTE — Telephone Encounter (Signed)
Spoke with patient and scheduled injection for 04/04/22. Patient aware driver needed

## 2022-03-23 NOTE — Telephone Encounter (Signed)
Patient called. Returning a call to schedule with Dr. Newton.  ?

## 2022-03-26 ENCOUNTER — Other Ambulatory Visit: Payer: Self-pay | Admitting: Orthopaedic Surgery

## 2022-03-28 ENCOUNTER — Ambulatory Visit
Admission: RE | Admit: 2022-03-28 | Discharge: 2022-03-28 | Disposition: A | Discharge status: Home / Self Care | Payer: Medicare Other | Source: Ambulatory Visit | Attending: Physician Assistant | Admitting: Physician Assistant

## 2022-03-28 DIAGNOSIS — M25512 Pain in left shoulder: Secondary | ICD-10-CM

## 2022-04-04 ENCOUNTER — Other Ambulatory Visit: Payer: Self-pay

## 2022-04-04 ENCOUNTER — Ambulatory Visit: Payer: Medicare Other | Admitting: Physical Medicine and Rehabilitation

## 2022-04-04 DIAGNOSIS — M47816 Spondylosis without myelopathy or radiculopathy, lumbar region: Secondary | ICD-10-CM

## 2022-04-04 MED ORDER — METHYLPREDNISOLONE ACETATE 80 MG/ML IJ SUSP
80.0000 mg | Freq: Once | INTRAMUSCULAR | Status: AC
  Administered 2022-04-04: 80 mg

## 2022-04-04 NOTE — Patient Instructions (Signed)

## 2022-04-04 NOTE — Progress Notes (Signed)
Functional Pain Scale - descriptive words and definitions  Moderate (4)   Constantly aware of pain, can complete ADLs with modification/sleep marginally affected at times/passive distraction is of no use, but active distraction gives some relief. Moderate range order  Average Pain 5-6   +Driver, -BT, -Dye Allergies.  Lower back pain on right with no radiation

## 2022-04-09 ENCOUNTER — Encounter: Payer: Self-pay | Admitting: Physician Assistant

## 2022-04-09 ENCOUNTER — Ambulatory Visit: Payer: Medicare Other | Admitting: Physician Assistant

## 2022-04-09 DIAGNOSIS — M19012 Primary osteoarthritis, left shoulder: Secondary | ICD-10-CM | POA: Diagnosis not present

## 2022-04-09 NOTE — Progress Notes (Signed)
HPI: Crystal Jackson comes in today to go over the MRI of the left shoulder.  She continues to have pain in the left shoulder.  States her condition remains basically unchanged.  She did get 50% improvement of her shoulder after subacromial injection. MRIs reviewed images reviewed with the patient.  MRI shows no full-thickness rotator cuff tear.  She does however have moderate tendinosis of the supraspinatus tendon with a partial bursal surface tear.  She also has moderate tendinosis of the infraspinatus tendon mild tendinosis of the subscapularis tendon.  Mild tendinosis intra-articular portion of the biceps tendon.  Moderate osteoarthritis of the glenohumeral joint.  Physical exam: General: Well-developed well-nourished female no acute distress. Left shoulder: Full overhead range of motion.  Impression: Left shoulder arthritis Left shoulder tendinosis the rotator cuff and long head biceps tendon   Plan: Will send her for an intra-articular injection of the left shoulder by Dr. Shon Baton to see what type of response she has to this.  She will follow-up with Dr. August Saucer after the injection to discuss further treatment.  Questions were encouraged and answered at length.

## 2022-04-10 ENCOUNTER — Other Ambulatory Visit: Payer: Self-pay

## 2022-04-10 DIAGNOSIS — M19012 Primary osteoarthritis, left shoulder: Secondary | ICD-10-CM

## 2022-04-12 ENCOUNTER — Other Ambulatory Visit: Payer: Self-pay

## 2022-04-12 ENCOUNTER — Encounter: Payer: Self-pay | Admitting: Sports Medicine

## 2022-04-12 ENCOUNTER — Ambulatory Visit: Payer: Medicare Other | Admitting: Sports Medicine

## 2022-04-12 DIAGNOSIS — G8929 Other chronic pain: Secondary | ICD-10-CM | POA: Diagnosis not present

## 2022-04-12 DIAGNOSIS — M19012 Primary osteoarthritis, left shoulder: Secondary | ICD-10-CM | POA: Diagnosis not present

## 2022-04-12 DIAGNOSIS — M25512 Pain in left shoulder: Secondary | ICD-10-CM | POA: Diagnosis not present

## 2022-04-12 MED ORDER — BUPIVACAINE HCL 0.25 % IJ SOLN
2.0000 mL | INTRAMUSCULAR | Status: AC | PRN
Start: 2022-04-12 — End: 2022-04-12
  Administered 2022-04-12: 2 mL via INTRA_ARTICULAR

## 2022-04-12 MED ORDER — METHYLPREDNISOLONE ACETATE 40 MG/ML IJ SUSP
80.0000 mg | INTRAMUSCULAR | Status: AC | PRN
Start: 2022-04-12 — End: 2022-04-12
  Administered 2022-04-12: 80 mg via INTRA_ARTICULAR

## 2022-04-12 MED ORDER — LIDOCAINE HCL 1 % IJ SOLN
2.0000 mL | INTRAMUSCULAR | Status: AC | PRN
Start: 2022-04-12 — End: 2022-04-12
  Administered 2022-04-12: 2 mL

## 2022-04-12 NOTE — Progress Notes (Signed)
   Procedure Note  [name]             Date of Birth: 1945-10-30           MRN: [id number]             Visit Date: 04/12/2022  Procedures: Visit Diagnoses:  1. Chronic left shoulder pain   2. Primary osteoarthritis of left shoulder    Large Joint Inj: L glenohumeral on 04/12/2022 2:19 PM Indications: pain Details: 22 G 3.5 in needle, ultrasound-guided posterior approach Medications: 2 mL lidocaine 1 %; 2 mL bupivacaine 0.25 %; 80 mg methylPREDNISolone acetate 40 MG/ML Outcome: tolerated well, no immediate complications  US-guided glenohumeral joint injection, left shoulder After discussion on risks/benefits/indications, informed verbal consent was obtained. A timeout was then performed. The patient was positioned lying lateral recumbent on examination table. The patient's shoulder was prepped with betadine and multiple alcohol swabs and utilizing ultrasound guidance, the patient's glenohumeral joint was identified on ultrasound. Using ultrasound guidance a 22-gauge, 3.5 inch needle with a mixture of 2:2:2 cc's lidocaine:bupivicaine:depomedrol was directed from a lateral to medial direction via in-plane technique into the glenohumeral joint with visualization of appropriate spread of injectate into the joint. Patient tolerated the procedure well without immediate complications.      Procedure, treatment alternatives, risks and benefits explained, specific risks discussed. Consent was given by the patient. Immediately prior to procedure a time out was called to verify the correct patient, procedure, equipment, support staff and site/side marked as required. Patient was prepped and draped in the usual sterile fashion.    - I evaluated the patient about 10 minutes post-injection and they had some improvement in pain and range of motion - follow-up with Dr. August Saucer as indicated; I am happy to see them as needed  Madelyn Brunner, DO Primary Care Sports Medicine Physician  [facility] - Orthopedics  This note was dictated using Dragon naturally speaking software and may contain errors in syntax, spelling, or content which have not been identified prior to signing this note.

## 2022-04-16 NOTE — Procedures (Signed)
Lumbar Facet Joint Intra-Articular Injection(s) with Fluoroscopic Guidance  [name]      Date of Birth: 05-15-1945 MRN: [id number] PCP: Melida Quitter, MD      Visit Date: 04/04/2022   Universal Protocol:    Date/Time: 04/04/2022  Consent Given By: the patient  Position: PRONE   Additional Comments: Vital signs were monitored before and after the procedure. Patient was prepped and draped in the usual sterile fashion. The correct patient, procedure, and site was verified.   Injection Procedure Details:  Procedure Site One Meds Administered:  Meds ordered this encounter  Medications   methylPREDNISolone acetate (DEPO-MEDROL) injection 80 mg     Laterality: Right  Location/Site:  L4-L5  Needle size: 22 guage  Needle type: Spinal  Needle Placement: Articular  Findings:  -Comments: Excellent flow of contrast producing a partial arthrogram.  Procedure Details: The fluoroscope beam is vertically oriented in AP, and the inferior recess is visualized beneath the lower pole of the inferior apophyseal process, which represents the target point for needle insertion. When direct visualization is difficult the target point is located at the medial projection of the vertebral pedicle. The region overlying each aforementioned target is locally anesthetized with a 1 to 2 ml. volume of 1% Lidocaine without Epinephrine.   The spinal needle was inserted into each of the above mentioned facet joints using biplanar fluoroscopic guidance. A 0.25 to 0.5 ml. volume of Isovue-250 was injected and a partial facet joint arthrogram was obtained. A single spot film was obtained of the resulting arthrogram.    One to 1.25 ml of the steroid/anesthetic solution was then injected into each of the facet joints noted above.   Additional Comments:  No complications occurred Dressing: 2 x 2 sterile gauze and Band-Aid    Post-procedure details: Patient was observed during the  procedure. Post-procedure instructions were reviewed.  Patient left the clinic in stable condition.

## 2022-04-16 NOTE — Progress Notes (Signed)
Crystal Jackson - 77 y.o. female MRN [id number]  Date of birth: Sep 30, 1945  Office Visit Note: Visit Date: 04/04/2022 PCP: Melida Quitter, MD Referred by: Melida Quitter, MD  Subjective: Chief Complaint  Patient presents with   Lower Back - Pain   HPI:  Crystal Jackson is a 77 y.o. female who comes in today at the request of Rexene Edison, PA-C for planned Right  L4-5 Lumbar facet/medial branch block with fluoroscopic guidance.  The patient has failed conservative care including home exercise, medications, time and activity modification.  This injection will be diagnostic and hopefully therapeutic.  Please see requesting physician notes for further details and justification.  Exam has shown concordant pain with facet joint loading.  We chose to complete an intra-articular injection to the fact that the last 1 of these she had about 3 years ago helped her greatly and she swears that this is exactly the same symptoms as before.  MRI is from 2020 with some mild narrowing and clear facet effusions.  Depending on relief if she just does not do as well she likely would need updated MRI of the lumbar spine looking for worsening arthritic change versus listhesis or stenosis.   ROS Otherwise per HPI.  Assessment & Plan: Visit Diagnoses:    ICD-10-CM   1. Spondylosis without myelopathy or radiculopathy, lumbar region  M47.816 XR C-ARM NO REPORT    Facet Injection    methylPREDNISolone acetate (DEPO-MEDROL) injection 80 mg      Plan: No additional findings.   Meds & Orders:  Meds ordered this encounter  Medications   methylPREDNISolone acetate (DEPO-MEDROL) injection 80 mg    Orders Placed This Encounter  Procedures   Facet Injection   XR C-ARM NO REPORT    Follow-up: Return for visit to requesting provider as needed.   Procedures: No procedures performed  Lumbar Facet Joint Intra-Articular Injection(s) with Fluoroscopic Guidance  [name]      Date of Birth: Mar 24, 1945 MRN:  [id number] PCP: Melida Quitter, MD      Visit Date: 04/04/2022   Universal Protocol:    Date/Time: 04/04/2022  Consent Given By: the patient  Position: PRONE   Additional Comments: Vital signs were monitored before and after the procedure. Patient was prepped and draped in the usual sterile fashion. The correct patient, procedure, and site was verified.   Injection Procedure Details:  Procedure Site One Meds Administered:  Meds ordered this encounter  Medications   methylPREDNISolone acetate (DEPO-MEDROL) injection 80 mg     Laterality: Right  Location/Site:  L4-L5  Needle size: 22 guage  Needle type: Spinal  Needle Placement: Articular  Findings:  -Comments: Excellent flow of contrast producing a partial arthrogram.  Procedure Details: The fluoroscope beam is vertically oriented in AP, and the inferior recess is visualized beneath the lower pole of the inferior apophyseal process, which represents the target point for needle insertion. When direct visualization is difficult the target point is located at the medial projection of the vertebral pedicle. The region overlying each aforementioned target is locally anesthetized with a 1 to 2 ml. volume of 1% Lidocaine without Epinephrine.   The spinal needle was inserted into each of the above mentioned facet joints using biplanar fluoroscopic guidance. A 0.25 to 0.5 ml. volume of Isovue-250 was injected and a partial facet joint arthrogram was obtained. A single spot film was obtained of the resulting arthrogram.    One to 1.25 ml of the steroid/anesthetic solution was then  injected into each of the facet joints noted above.   Additional Comments:  No complications occurred Dressing: 2 x 2 sterile gauze and Band-Aid    Post-procedure details: Patient was observed during the procedure. Post-procedure instructions were reviewed.  Patient left the clinic in stable condition.    Clinical History: MRI LUMBAR SPINE  WITHOUT CONTRAST    TECHNIQUE:  Multiplanar, multisequence MR imaging of the lumbar spine was  performed. No intravenous contrast was administered.    COMPARISON:  Radiographs from 10/01/2018    FINDINGS:  Segmentation: The lowest lumbar type non-rib-bearing vertebra is  labeled as L5.    Alignment:  2 mm degenerative anterolisthesis at L4-5.    Vertebrae: Cysts fluid signal intensity lesions favoring bilaterally  Tarlov at the S2 level and on the left at the S1 level.    Degenerative perifacet edema bilaterally at L4-5 and L5-S1.    Conus medullaris and cauda equina: Conus extends to the L1 level.  Conus and cauda equina appear normal.    Paraspinal and other soft tissues: Small common iliac lymph nodes  are present.    Disc levels:    T12-L1: No impingement.  Mild disc bulge.    L1-2: Unremarkable.    L2-3: No impingement.  Mild disc bulge.    L3-4: Mild displacement of the right L3 nerve in the lateral  extraforaminal space due to right lateral extraforaminal disc  protrusion.    L4-5: Borderline left subarticular lateral recess stenosis due to  disc bulge and facet arthropathy. Borderline central narrowing of  the thecal sac. Bilateral facet joint effusions as on image 30/14.    L5-S1: No impingement. 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  Bilateral degenerative facet arthropathy.    IMPRESSION:  1. Lumbar spondylosis and degenerative disc disease resulting in  mild impingement at L3-4.  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  3. Degenerative perifacet edema at L4-5 and L5-S1 with facet joint  effusions bilaterally at L4-5.      Electronically Signed    By: Gaylyn Rong M.D.    On: 10/10/2018 14:25     Objective:  VS:  HT:    WT:   BMI:     BP:   HR: bpm  TEMP: ( )  RESP:  Physical Exam Vitals and nursing note reviewed.  Constitutional:      General: She is not in acute distress.    Appearance: Normal appearance. She is not  ill-appearing.  HENT:     Head: Normocephalic and atraumatic.     Right Ear: External ear normal.     Left Ear: External ear normal.  Eyes:     Extraocular Movements: Extraocular movements intact.  Cardiovascular:     Rate and Rhythm: Normal rate.     Pulses: Normal pulses.  Pulmonary:     Effort: Pulmonary effort is normal. No respiratory distress.  Abdominal:     General: There is no distension.     Palpations: Abdomen is soft.  Musculoskeletal:        General: Tenderness present.     Cervical back: Neck supple.     Right lower leg: No edema.     Left lower leg: No edema.     Comments: Patient has good distal strength with no pain over the greater trochanters.  No clonus or focal weakness.  Skin:    Findings: No erythema, lesion or rash.  Neurological:     General: No focal deficit present.  Mental Status: She is alert and oriented to person, place, and time.     Sensory: No sensory deficit.     Motor: No weakness or abnormal muscle tone.     Coordination: Coordination normal.  Psychiatric:        Mood and Affect: Mood normal.        Behavior: Behavior normal.      Imaging: No results found.

## 2022-04-27 ENCOUNTER — Other Ambulatory Visit: Payer: Self-pay | Admitting: Orthopaedic Surgery

## 2022-04-30 ENCOUNTER — Encounter: Payer: Self-pay | Admitting: Physical Medicine and Rehabilitation

## 2022-05-01 ENCOUNTER — Ambulatory Visit (INDEPENDENT_AMBULATORY_CARE_PROVIDER_SITE_OTHER): Payer: Medicare Other | Admitting: Family Medicine

## 2022-05-01 ENCOUNTER — Encounter (INDEPENDENT_AMBULATORY_CARE_PROVIDER_SITE_OTHER): Payer: Self-pay | Admitting: Family Medicine

## 2022-05-01 VITALS — BP 125/73 | HR 73 | Temp 97.7°F | Ht 64.0 in | Wt 220.0 lb

## 2022-05-01 DIAGNOSIS — I10 Essential (primary) hypertension: Secondary | ICD-10-CM | POA: Diagnosis not present

## 2022-05-01 DIAGNOSIS — R42 Dizziness and giddiness: Secondary | ICD-10-CM

## 2022-05-01 DIAGNOSIS — E559 Vitamin D deficiency, unspecified: Secondary | ICD-10-CM | POA: Diagnosis not present

## 2022-05-01 DIAGNOSIS — Z6837 Body mass index (BMI) 37.0-37.9, adult: Secondary | ICD-10-CM

## 2022-05-01 DIAGNOSIS — Z6841 Body Mass Index (BMI) 40.0 and over, adult: Secondary | ICD-10-CM

## 2022-05-01 DIAGNOSIS — R7303 Prediabetes: Secondary | ICD-10-CM

## 2022-05-01 NOTE — Progress Notes (Signed)
Crystal Jackson, D.O.  ABFM, ABOM Specializing in Clinical Bariatric Medicine  Office located at: [address] [place], [place]  [address]     Assessment and Plan:   No orders of the defined types were placed in this encounter.   There are no discontinued medications.   No orders of the defined types were placed in this encounter.   Pre-diabetes Assessment: Condition is not optimized.  Lab Results  Component Value Date   HGBA1C 5.7 (H) 01/29/2022   HGBA1C 5.7 (H) 09/27/2021   HGBA1C 5.7 (H) 02/07/2021   INSULIN 18.4 09/27/2021   INSULIN 17.5 02/07/2021   INSULIN 17.7 06/29/2020  - She has been more compliant with  Metformin 500 mg BID. She endorses that she sometimes forgets to take it twice a day, but usually always take its atleast once a day. - Her hunger and cravings are controlled when eating on plan.   Plan: - She will begin taking med more regularly.  - Continue to decrease simple carbs/ sugars; increase fiber and proteins -> follow her meal plan.   Crystal Jackson will continue to work on weight loss, exercise, via their meal plan we devised to help decrease the risk of progressing to diabetes.    Vitamin D deficiency Assessment: Condition is stable.  Lab Results  Component Value Date   VD25OH 50.7 01/29/2022   VD25OH 47.7 09/27/2021   VD25OH 55.9 02/07/2021  - She reports good compliance and tolerance with OTC Vitamin D 600-400 mg 1 po qd. Denies any side effects.  Plan: - Continue with meds as prescribed.  - weight loss will likely improve availability of vitamin D, thus encouraged Crystal Jackson to continue with meal plan and their weight loss efforts to further improve this condition.  Thus, we will need to monitor levels regularly (every 3-4 mo on average) to keep levels within normal limits and prevent over supplementation.   Essential hypertension Assessment: Condition is stable.  Last 3 blood pressure readings in our office are as follows: BP  Readings from Last 3 Encounters:  05/01/22 125/73  03/20/22 126/77  02/27/22 128/74  - No issues with Lopressor 50 mg BID and Aldactone 25 mg daily. Denies any side effects. - Asymptomatic, no concerns today.   Plan: - BP is stable today.  - Continue with meds as prescribed by pcp/specialist. - Continue with Prudent nutritional plan and low sodium diet, advance exercise as tolerated.   - We will continue to monitor closely alongside PCP/ specialists.  Pt reminded to also f/up with those individuals as instructed by them.  - We will continue to monitor symptoms as they relate to the her weight loss journey.   Chronic vertigo Assessment: Condition is Not optimized. - Pt is still having symptoms of chronic vertigo and has seen several specialists, including neuro and cardiology with negative work-ups.   Plan: -  I provided patient with the Chronic Vertigo Epley movements handout and encouraged her to try those movements from the handout daily.  -  I reccommended her to consider doing PT for her Chronic Vertigo.  -  Continue with treatment as recommended by PCP -  Will continue to monitor symptoms.    TREATMENT PLAN FOR OBESITY: BMI 40.0-44.9, adult (HCC)-current bmi 37.74 Morbid obesity (HCC)-start bmi 41.54/date 07/09/2018 Assessment: Condition is not optimized. Biometric data collected today, was reviewed with patient.  Fat mass has increased by 7.2 lb. Muscle mass has decreased by 3.6 lb. Total body water has increased by  6.6 lb.   Plan:  - Crystal Jackson is currently in the action stage of change. As such, her goal is to continue weight management plan. - Crystal Jackson will work on healthier eating habits and continue with Category 1 meal plan and  keeping a food journal and adhering to recommended goals of 1150-1250 calories and 85+ protein. - I recommended patient to consider purchasing the [facility] + silicone freezer containers for meal prepping.  - I encouraged patient to try the slower cooker  recipes from the Crock pot handout and Weight watchers recipes.   Behavioral Intervention Additional resources provided today:  Chronic Vertigo Epley movements handout , Crockpot recipes handout Evidence-based interventions for health behavior change were utilized today including the discussion of self monitoring techniques, problem-solving barriers and SMART goal setting techniques.   Regarding patient's less desirable eating habits and patterns, we employed the technique of small changes.  Pt will specifically work on: meal prepping and trying Weight Watchers high protein and low carb recipes  for next visit.     Recommended Physical Activity Goals Crystal Jackson has been advised to work up to 150 minutes of moderate intensity aerobic activity a week and strengthening exercises 2-3 times per week for cardiovascular health, weight loss maintenance and preservation of muscle mass.  She has agreed to Continue current level of physical activity    FOLLOW UP: Return in about 4 weeks (around 05/29/2022). She was informed of the importance of frequent follow up visits to maximize her success with intensive lifestyle modifications for her multiple health conditions.  Subjective:   Chief complaint: Obesity Crystal Jackson is here to discuss her progress with her obesity treatment plan. She is on the the Category 2 Plan and keeping a food journal and adhering to recommended goals of 1150-1250 calories and 85+ protein and states she is following her eating plan approximately 50% of the time. She states she is not exercising.     Interval History:  Crystal Jackson is here for a follow up office visit.  Since last office visit she: - She endorses having pain in her back and legs. She is also having Chronic Vertigo, which is treated by her PCP.  - Due to these various condition, she endorses not feeling very focused on her meal plan.  - Patient endorses that she needs more meal/ recipe ideas.   Pharmacotherapy for  weight loss: She is currently taking  Metformin  for medical weight loss.  Denies side effects.    Review of Systems:  Pertinent positives were addressed with patient today.   Weight Summary and Biometrics   Weight Lost Since Last Visit: 0  Weight Gained Since Last Visit: 3 lb    Vitals Temp: 97.7 F (36.5 C) BP: 125/73 Pulse Rate: 73 SpO2: 94 %   Anthropometric Measurements Height: 5\' 4"  (1.626 m) Weight: 220 lb (99.8 kg) BMI (Calculated): 37.74 Weight at Last Visit: 217 lb Weight Lost Since Last Visit: 0 Weight Gained Since Last Visit: 3 lb Starting Weight: 242 lb Total Weight Loss (lbs): 22 lb (9.979 kg) Peak Weight: 242 lb   Body Composition  Body Fat %: 48.8 % Fat Mass (lbs): 107.8 lbs Muscle Mass (lbs): 107.2 lbs Total Body Water (lbs): 83.4 lbs Visceral Fat Rating : 17   Other Clinical Data Fasting: No Labs: No Today's Visit #: 67 Starting Date: 07/09/18     Objective:   PHYSICAL EXAM: Blood pressure 125/73, pulse 73, temperature 97.7 F (36.5 C), height 5\' 4"  (1.626 m), weight 220  lb (99.8 kg), SpO2 94 %. Body mass index is 37.76 kg/m.  General: Well Developed, well nourished, and in no acute distress.  HEENT: Normocephalic, atraumatic Skin: Warm and dry, cap RF less 2 sec, good turgor Chest:  Normal excursion, shape, no gross abn Respiratory: speaking in full sentences, no conversational dyspnea NeuroM-Sk: Ambulates w/o assistance, moves * 4 Psych: A and O *3, insight good, mood-full  DIAGNOSTIC DATA REVIEWED:  BMET    Component Value Date/Time   NA 135 01/29/2022 1304   K 4.8 01/29/2022 1304   CL 97 01/29/2022 1304   CO2 25 01/29/2022 1304   GLUCOSE 92 01/29/2022 1304   GLUCOSE 113 (H) 05/16/2021 0541   BUN 19 01/29/2022 1304   CREATININE 1.06 (H) 01/29/2022 1304   CALCIUM 9.6 01/29/2022 1304   GFRNONAA >60 05/16/2021 0541   GFRAA 62 10/19/2019 0951   Lab Results  Component Value Date   HGBA1C 5.7 (H) 01/29/2022   HGBA1C  5.8 (H) 07/09/2018   Lab Results  Component Value Date   INSULIN 18.4 09/27/2021   INSULIN 25.0 (H) 07/09/2018   Lab Results  Component Value Date   TSH 1.199 05/14/2021   CBC    Component Value Date/Time   WBC 9.3 05/16/2021 0919   RBC 3.77 (L) 05/16/2021 0919   HGB 11.5 (L) 05/16/2021 0919   HGB 13.6 07/09/2018 1609   HCT 34.5 (L) 05/16/2021 0919   HCT 39.7 07/09/2018 1609   PLT 148 (L) 05/16/2021 0919   PLT 241 07/09/2018 1609   MCV 91.5 05/16/2021 0919   MCV 89 07/09/2018 1609   MCH 30.5 05/16/2021 0919   MCHC 33.3 05/16/2021 0919   RDW 13.2 05/16/2021 0919   RDW 13.1 07/09/2018 1609   Iron Studies No results found for: "IRON", "TIBC", "FERRITIN", "IRONPCTSAT" Lipid Panel     Component Value Date/Time   CHOL 149 09/27/2021 1141   TRIG 80 09/27/2021 1141   HDL 60 09/27/2021 1141   CHOLHDL 2.5 09/27/2021 1141   LDLCALC 74 09/27/2021 1141   Hepatic Function Panel     Component Value Date/Time   PROT 7.0 09/27/2021 1141   ALBUMIN 4.5 09/27/2021 1141   AST 24 09/27/2021 1141   ALT 22 09/27/2021 1141   ALKPHOS 45 09/27/2021 1141   BILITOT 0.4 09/27/2021 1141   BILIDIR 0.1 05/14/2021 2238   IBILI 0.4 05/14/2021 2238      Component Value Date/Time   TSH 1.199 05/14/2021 2238   TSH 2.210 07/09/2018 1609   Nutritional Lab Results  Component Value Date   VD25OH 50.7 01/29/2022   VD25OH 47.7 09/27/2021   VD25OH 55.9 02/07/2021    Attestations:   Reviewed by clinician on day of visit: allergies, medications, problem list, medical history, surgical history, family history, social history, and previous encounter notes.    I,Special Puri,acting as a Neurosurgeon for [employer], DO.,have documented all relevant documentation on the behalf of Thomasene Lot, DO,as directed by  Thomasene Lot, DO while in the presence of Thomasene Lot, DO.   I, Thomasene Lot, DO, have reviewed all documentation for this visit. The documentation on 05/01/22 for the exam,  diagnosis, procedures, and orders are all accurate and complete.

## 2022-05-02 ENCOUNTER — Telehealth: Payer: Self-pay

## 2022-05-02 ENCOUNTER — Ambulatory Visit: Payer: Medicare Other | Admitting: Orthopedic Surgery

## 2022-05-02 DIAGNOSIS — M19012 Primary osteoarthritis, left shoulder: Secondary | ICD-10-CM | POA: Diagnosis not present

## 2022-05-02 NOTE — Telephone Encounter (Signed)
Called patient to schedule OV. LVM to return call

## 2022-05-03 NOTE — Telephone Encounter (Signed)
Spoke with patient and scheduled OV for 05/08/22.

## 2022-05-04 ENCOUNTER — Encounter: Payer: Self-pay | Admitting: Orthopedic Surgery

## 2022-05-04 NOTE — Progress Notes (Unsigned)
Office Visit Note   [name]           Date of Birth: 06-01-1945           MRN: [id number] Visit Date: 05/02/2022 Requested by: Crystal Quitter, MD [address] [place],  [place] [address] PCP: Crystal Quitter, MD  Subjective: Chief Complaint  Patient presents with   Left Shoulder - Pain    HPI: Crystal Jackson is a 77 y.o. female who presents to the office reporting left shoulder pain.  She had an injection on 04/12/2022 with Dr. Shon Jackson and that has helped her a lot.  Prior injection in the subacromial space gave her about 50% relief.  She reports a little bit of decreased range of motion reaching behind her back.  All in all she is about 70 to 80% better than she was.  The pain does not wake her from sleep at night.  Has not tried physical therapy.  She likes to work outside.  Takes extra strength Tylenol and ibuprofen with some relief.  Has known rotator cuff tendinosis biceps tendon tendinosis as well as high-grade partial-thickness cartilage loss on the glenohumeral joint from MRI scanning..                ROS: All systems reviewed are negative as they relate to the chief complaint within the history of present illness.  Patient denies fevers or chills.  Assessment & Plan: Visit Diagnoses: No diagnosis found.  Plan: Impression is left shoulder arthritis and tendinitis with maintained range of motion and good response from glenohumeral joint injection.  She will come back for maximum 2-3 shoulder injections per year.  Not really in the surgical realm yet based on clinical symptoms.  Follow-Up Instructions: No follow-ups on file.   Orders:  No orders of the defined types were placed in this encounter.  No orders of the defined types were placed in this encounter.     Procedures: No procedures performed   Clinical Data: No additional findings.  Objective: Vital Signs: There were no vitals taken for this visit.  Physical Exam:  Constitutional: Patient appears  well-developed HEENT:  Head: Normocephalic Eyes:EOM are normal Neck: Normal range of motion Cardiovascular: Normal rate Pulmonary/chest: Effort normal Neurologic: Patient is alert Skin: Skin is warm Psychiatric: Patient has normal mood and affect  Ortho Exam: Ortho exam demonstrates range of motion on the left of 60/100/175.  Excellent rotator cuff strength demonstrate supraspinatus and subscap muscle testing with no coarse grinding or crepitus with passive range of motion of the shoulder.  No discrete AC joint tenderness.  Neck range of motion is full.  Specialty Comments:  MRI LUMBAR SPINE WITHOUT CONTRAST    TECHNIQUE:  Multiplanar, multisequence MR imaging of the lumbar spine was  performed. No intravenous contrast was administered.    COMPARISON:  Radiographs from 10/01/2018    FINDINGS:  Segmentation: The lowest lumbar type non-rib-bearing vertebra is  labeled as L5.    Alignment:  2 mm degenerative anterolisthesis at L4-5.    Vertebrae: Cysts fluid signal intensity lesions favoring bilaterally  Tarlov at the S2 level and on the left at the S1 level.    Degenerative perifacet edema bilaterally at L4-5 and L5-S1.    Conus medullaris and cauda equina: Conus extends to the L1 level.  Conus and cauda equina appear normal.    Paraspinal and other soft tissues: Small common iliac lymph nodes  are present.    Disc levels:  T12-L1: No impingement.  Mild disc bulge.    L1-2: Unremarkable.    L2-3: No impingement.  Mild disc bulge.    L3-4: Mild displacement of the right L3 nerve in the lateral  extraforaminal space due to right lateral extraforaminal disc  protrusion.    L4-5: Borderline left subarticular lateral recess stenosis due to  disc bulge and facet arthropathy. Borderline central narrowing of  the thecal sac. Bilateral facet joint effusions as on image 30/14.    L5-S1: No impingement. 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  Bilateral degenerative facet  arthropathy.    IMPRESSION:  1. Lumbar spondylosis and degenerative disc disease resulting in  mild impingement at L3-4.  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  3. Degenerative perifacet edema at L4-5 and L5-S1 with facet joint  effusions bilaterally at L4-5.      Electronically Signed    By: Gaylyn Rong M.D.    On: 10/10/2018 14:25  Imaging: No results found.   PMFS History: Patient Active Problem List   Diagnosis Date Noted   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   BMI 40.0-44.9, adult (HCC)-current bmi 37.3 03/20/2022   Morbid obesity (HCC) 01/30/2022   BMI 37.0-37.9, adult 01/30/2022   B12 deficiency due to diet 09/27/2021   SOB (shortness of breath) on exertion 09/27/2021   Chronic vertigo 09/27/2021   Other hyperlipidemia 08/28/2021   Vitamin D deficiency 08/28/2021   Pre-diabetes 06/28/2021   Sepsis (HCC) 05/15/2021   Hypoxia 05/15/2021   Essential hypertension 05/15/2021   DM (diabetes mellitus), type 2 (HCC) 05/15/2021   Hypomagnesemia 05/15/2021   UTI (urinary tract infection) 05/14/2021   Acute exacerbation of chronic low back pain 03/10/2021   OSA on CPAP 09/03/2018   Mild persistent asthma without complication 09/03/2018   Class 3 severe obesity with serious comorbidity and body mass index (BMI) of 40.0 to 44.9 in adult (HCC) 04/14/2018   Unilateral primary osteoarthritis, left knee 04/14/2018   Asthma in adult, mild intermittent, uncomplicated 02/26/2017   Allergic rhinitis 09/05/2013   Past Medical History:  Diagnosis Date   Anxiety    Arthritis    Asthma    Back pain    Bilateral swelling of feet    Constipation    Depression    Gallbladder problem    GERD (gastroesophageal reflux disease)    History of low potassium    Hypertension    Joint pain    Obesity    Palpitations    Shortness of breath    Sleep apnea     Family History  Problem Relation Age of Onset   Diabetes Mother    Heart disease Mother    Anxiety  disorder Mother    Obesity Mother    Diabetes Father    Hypertension Father    Obesity Father     Past Surgical History:  Procedure Laterality Date   ABDOMINAL HYSTERECTOMY     APPENDECTOMY     CHOLECYSTECTOMY     KNEE SURGERY Left    SHOULDER SURGERY Right    Social History   Occupational History   Not on file  Tobacco Use   Smoking status: Never    Passive exposure: Never   Smokeless tobacco: Never  Vaping Use   Vaping Use: Never used  Substance and Sexual Activity   Alcohol use: No    Alcohol/week: 0.0 standard drinks of alcohol   Drug use: No   Sexual activity: Yes

## 2022-05-08 ENCOUNTER — Ambulatory Visit: Payer: Medicare Other | Admitting: Physical Medicine and Rehabilitation

## 2022-05-09 ENCOUNTER — Ambulatory Visit: Payer: Medicare Other | Admitting: Physical Medicine and Rehabilitation

## 2022-05-09 ENCOUNTER — Encounter: Payer: Self-pay | Admitting: Physical Medicine and Rehabilitation

## 2022-05-09 DIAGNOSIS — M47816 Spondylosis without myelopathy or radiculopathy, lumbar region: Secondary | ICD-10-CM | POA: Diagnosis not present

## 2022-05-09 DIAGNOSIS — G8929 Other chronic pain: Secondary | ICD-10-CM | POA: Diagnosis not present

## 2022-05-09 DIAGNOSIS — M545 Low back pain, unspecified: Secondary | ICD-10-CM | POA: Diagnosis not present

## 2022-05-09 MED ORDER — TRAMADOL HCL 50 MG PO TABS: 50.0000 mg | ORAL_TABLET | Freq: Three times a day (TID) | ORAL | 0 refills | Status: DC | PRN

## 2022-05-09 NOTE — Progress Notes (Signed)
Crystal Jackson - 77 y.o. female MRN [id number]  Date of birth: 01-28-1945  Office Visit Note: Visit Date: 05/09/2022 PCP: Melida Quitter, MD Referred by: Melida Quitter, MD  Subjective: Chief Complaint  Patient presents with   Lower Back - Pain   HPI: Crystal Jackson is a 77 y.o. female who comes in today for evaluation of chronic, worsening and severe right sided lower back pain. Pain ongoing for several years, worsens with movement and activity. She describes pain as aching sensation, currently rates as 7 out of 10. Some relief of pain with home exercise regimen, rest and use of medications. Also reports intermittent use of lumbar brace and heating pad. No relief of pain with Robaxin. History of formal physical therapy for lumbar issues many years ago, she is unable to remember if these treatments were beneficial. Lumbar MRI imaging from 2020 exhibits degenerative perifacet edema at L4-5 and L5-S1 with facet joint effusions bilaterally at L4-L5. No high grade spinal canal stenosis noted. She recently underwent right L4-L5 facet joint injection in our office on 04/04/2022, she reports some relief of pain with this injection, however pain remains severe. Patient underwent this same injection about 3 years ago and reports it helped significantly. Patient managed by Dr. Rudi Heap, PA from orthopedic standpoint. Patient uses cane to assist with ambulation. Patient denies focal weakness, numbness and tingling. No recent trauma or falls.   Patients course is complicated by morbid obesity and diabetes mellitus. Of note, she does list multiple allergies/intolerances to medications.    Review of Systems  Musculoskeletal:  Positive for back pain.  Neurological:  Negative for tingling, sensory change, focal weakness and weakness.  All other systems reviewed and are negative.  Otherwise per HPI.  Assessment & Plan: Visit Diagnoses:    ICD-10-CM   1. Chronic right-sided low back  pain without sciatica  M54.50 MR LUMBAR SPINE WO CONTRAST   G89.29 Ambulatory referral to Physical Therapy    2. Spondylosis without myelopathy or radiculopathy, lumbar region  M47.816 MR LUMBAR SPINE WO CONTRAST    Ambulatory referral to Physical Therapy    3. Facet arthropathy, lumbar  M47.816 MR LUMBAR SPINE WO CONTRAST    Ambulatory referral to Physical Therapy       Plan: Findings:  Chronic recalcitrant right-sided axial back pain.  No radicular symptoms down the leg.  Patient continues to have severe pain despite good conservative therapy such as formal physical therapy, home exercise regimen, rest and use of medications.  Patient's clinical presentation and exam are consistent with facet mediated pain.  She does have pain with lumbar extension upon exam today.  There is degenerative facet edema at the levels of L4-L5 and L5-S1, facet joint effusions noted bilaterally at L4-L5.  Minimal relief with recent right L4-L5 facet joint injection.  Next step is to obtain new lumbar MRI imaging. I also feel patient would benefit from re-grouping with our in house physical therapy team. I discussed medication management with her today and prescribe short course of Tramadol. Patient encouraged to remain active as tolerated. No red flag symptoms noted upon exam today.     Meds & Orders:  Meds ordered this encounter  Medications   traMADol (ULTRAM) 50 MG tablet    Sig: Take 1 tablet (50 mg total) by mouth every 8 (eight) hours as needed.    Dispense:  20 tablet    Refill:  0    Orders Placed This Encounter  Procedures   MR  LUMBAR SPINE WO CONTRAST   Ambulatory referral to Physical Therapy    Follow-up: Return for follow up for lumbar MRI review.   Procedures: No procedures performed      Clinical History: MRI LUMBAR SPINE WITHOUT CONTRAST    TECHNIQUE:  Multiplanar, multisequence MR imaging of the lumbar spine was  performed. No intravenous contrast was administered.    COMPARISON:   Radiographs from 10/01/2018    FINDINGS:  Segmentation: The lowest lumbar type non-rib-bearing vertebra is  labeled as L5.    Alignment:  2 mm degenerative anterolisthesis at L4-5.    Vertebrae: Cysts fluid signal intensity lesions favoring bilaterally  Tarlov at the S2 level and on the left at the S1 level.    Degenerative perifacet edema bilaterally at L4-5 and L5-S1.    Conus medullaris and cauda equina: Conus extends to the L1 level.  Conus and cauda equina appear normal.    Paraspinal and other soft tissues: Small common iliac lymph nodes  are present.    Disc levels:    T12-L1: No impingement.  Mild disc bulge.    L1-2: Unremarkable.    L2-3: No impingement.  Mild disc bulge.    L3-4: Mild displacement of the right L3 nerve in the lateral  extraforaminal space due to right lateral extraforaminal disc  protrusion.    L4-5: Borderline left subarticular lateral recess stenosis due to  disc bulge and facet arthropathy. Borderline central narrowing of  the thecal sac. Bilateral facet joint effusions as on image 30/14.    L5-S1: No impingement. 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  Bilateral degenerative facet arthropathy.    IMPRESSION:  1. Lumbar spondylosis and degenerative disc disease resulting in  mild impingement at L3-4.  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  3. Degenerative perifacet edema at L4-5 and L5-S1 with facet joint  effusions bilaterally at L4-5.      Electronically Signed    By: Gaylyn Rong M.D.    On: 10/10/2018 14:25   She reports that she has never smoked. She has never been exposed to tobacco smoke. She has never used smokeless tobacco.  Recent Labs    09/27/21 1141 01/29/22 1304  HGBA1C 5.7* 5.7*    Objective:  VS:  HT:    WT:   BMI:     BP:   HR: bpm  TEMP: ( )  RESP:  Physical Exam Vitals and nursing note reviewed.  HENT:     Head: Normocephalic and atraumatic.     Right Ear: External ear normal.      Left Ear: External ear normal.     Nose: Nose normal.     Mouth/Throat:     Mouth: Mucous membranes are moist.  Eyes:     Extraocular Movements: Extraocular movements intact.  Cardiovascular:     Rate and Rhythm: Normal rate.     Pulses: Normal pulses.  Pulmonary:     Effort: Pulmonary effort is normal.  Abdominal:     General: Abdomen is flat. There is no distension.  Musculoskeletal:        General: Tenderness present.     Cervical back: Normal range of motion.     Comments: Patient rises from seated position to standing without difficulty.Concordant low back pain with facet loading, lumbar spine extension and rotation. 5/5 strength noted with bilateral hip flexion, knee flexion/extension, ankle dorsiflexion/plantarflexion and EHL. No clonus noted bilaterally. No pain upon palpation of greater trochanters. No pain with internal/external rotation  of bilateral hips. Sensation intact bilaterally. Negative slump test bilaterally. Ambulates with cane, gait unsteady.       Skin:    General: Skin is warm and dry.     Capillary Refill: Capillary refill takes less than 2 seconds.  Neurological:     Mental Status: She is alert and oriented to person, place, and time.     Gait: Gait abnormal.  Psychiatric:        Mood and Affect: Mood normal.        Behavior: Behavior normal.     Ortho Exam  Imaging: No results found.  Past Medical/Family/Surgical/Social History: Medications & Allergies reviewed per EMR, new medications updated. Patient Active Problem List   Diagnosis Date Noted   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   BMI 40.0-44.9, adult (HCC)-current bmi 37.3 03/20/2022   Morbid obesity (HCC) 01/30/2022   BMI 37.0-37.9, adult 01/30/2022   B12 deficiency due to diet 09/27/2021   SOB (shortness of breath) on exertion 09/27/2021   Chronic vertigo 09/27/2021   Other hyperlipidemia 08/28/2021   Vitamin D deficiency 08/28/2021   Pre-diabetes 06/28/2021   Sepsis (HCC) 05/15/2021    Hypoxia 05/15/2021   Essential hypertension 05/15/2021   DM (diabetes mellitus), type 2 (HCC) 05/15/2021   Hypomagnesemia 05/15/2021   UTI (urinary tract infection) 05/14/2021   Acute exacerbation of chronic low back pain 03/10/2021   OSA on CPAP 09/03/2018   Mild persistent asthma without complication 09/03/2018   Class 3 severe obesity with serious comorbidity and body mass index (BMI) of 40.0 to 44.9 in adult (HCC) 04/14/2018   Unilateral primary osteoarthritis, left knee 04/14/2018   Asthma in adult, mild intermittent, uncomplicated 02/26/2017   Allergic rhinitis 09/05/2013   Past Medical History:  Diagnosis Date   Anxiety    Arthritis    Asthma    Back pain    Bilateral swelling of feet    Constipation    Depression    Gallbladder problem    GERD (gastroesophageal reflux disease)    History of low potassium    Hypertension    Joint pain    Obesity    Palpitations    Shortness of breath    Sleep apnea    Family History  Problem Relation Age of Onset   Diabetes Mother    Heart disease Mother    Anxiety disorder Mother    Obesity Mother    Diabetes Father    Hypertension Father    Obesity Father    Past Surgical History:  Procedure Laterality Date   ABDOMINAL HYSTERECTOMY     APPENDECTOMY     CHOLECYSTECTOMY     KNEE SURGERY Left    SHOULDER SURGERY Right    Social History   Occupational History   Not on file  Tobacco Use   Smoking status: Never    Passive exposure: Never   Smokeless tobacco: Never  Vaping Use   Vaping Use: Never used  Substance and Sexual Activity   Alcohol use: No    Alcohol/week: 0.0 standard drinks of alcohol   Drug use: No   Sexual activity: Yes

## 2022-05-09 NOTE — Progress Notes (Signed)
Functional Pain Scale - descriptive words and definitions  Moderate (4)   Constantly aware of pain, can complete ADLs with modification/sleep marginally affected at times/passive distraction is of no use, but active distraction gives some relief. Moderate range order  Average Pain  varies on activity  Lower back pain on the right side with no radiation

## 2022-05-22 ENCOUNTER — Ambulatory Visit
Admission: RE | Admit: 2022-05-22 | Discharge: 2022-05-22 | Disposition: A | Discharge status: Home / Self Care | Payer: Medicare Other | Source: Ambulatory Visit | Attending: Physical Medicine and Rehabilitation | Admitting: Physical Medicine and Rehabilitation

## 2022-05-22 DIAGNOSIS — M545 Low back pain, unspecified: Secondary | ICD-10-CM

## 2022-05-22 DIAGNOSIS — M47816 Spondylosis without myelopathy or radiculopathy, lumbar region: Secondary | ICD-10-CM

## 2022-05-23 NOTE — Telephone Encounter (Signed)
No results in chart yet.

## 2022-05-25 ENCOUNTER — Encounter: Payer: Self-pay | Admitting: Physical Therapy

## 2022-05-25 ENCOUNTER — Ambulatory Visit: Payer: Medicare Other | Admitting: Physical Therapy

## 2022-05-25 ENCOUNTER — Other Ambulatory Visit: Payer: Self-pay

## 2022-05-25 DIAGNOSIS — G8929 Other chronic pain: Secondary | ICD-10-CM

## 2022-05-25 DIAGNOSIS — R262 Difficulty in walking, not elsewhere classified: Secondary | ICD-10-CM | POA: Diagnosis not present

## 2022-05-25 DIAGNOSIS — M545 Low back pain, unspecified: Secondary | ICD-10-CM | POA: Diagnosis not present

## 2022-05-25 DIAGNOSIS — M6281 Muscle weakness (generalized): Secondary | ICD-10-CM

## 2022-05-25 DIAGNOSIS — R2681 Unsteadiness on feet: Secondary | ICD-10-CM

## 2022-05-25 NOTE — Therapy (Signed)
OUTPATIENT PHYSICAL THERAPY THORACOLUMBAR EVALUATION   Patient Name: Crystal Jackson MRN: [id number] DOB:[birth year], 77 y.o., female Today's Date: 05/25/2022  END OF SESSION:  PT End of Session - 05/25/22 1431     Visit Number 1    Number of Visits 9    Date for PT Re-Evaluation 07/20/22    Authorization Type [employer] Medicare    Authorization Time Period 05/25/22 to 07/20/22    Progress Note Due on Visit 10    PT Start Time 1348    PT Stop Time 1428    PT Time Calculation (min) 40 min    Activity Tolerance Patient tolerated treatment well    Behavior During Therapy WFL for tasks assessed/performed             Past Medical History:  Diagnosis Date   Anxiety    Arthritis    Asthma    Back pain    Bilateral swelling of feet    Constipation    Depression    Gallbladder problem    GERD (gastroesophageal reflux disease)    History of low potassium    Hypertension    Joint pain    Obesity    Palpitations    Shortness of breath    Sleep apnea    Past Surgical History:  Procedure Laterality Date   ABDOMINAL HYSTERECTOMY     APPENDECTOMY     CHOLECYSTECTOMY     KNEE SURGERY Left    SHOULDER SURGERY Right    Patient Active Problem List   Diagnosis Date Noted   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   BMI 40.0-44.9, adult (HCC)-current bmi 37.3 03/20/2022   Morbid obesity (HCC) 01/30/2022   BMI 37.0-37.9, adult 01/30/2022   B12 deficiency due to diet 09/27/2021   SOB (shortness of breath) on exertion 09/27/2021   Chronic vertigo 09/27/2021   Other hyperlipidemia 08/28/2021   Vitamin D deficiency 08/28/2021   Pre-diabetes 06/28/2021   Sepsis (HCC) 05/15/2021   Hypoxia 05/15/2021   Essential hypertension 05/15/2021   DM (diabetes mellitus), type 2 (HCC) 05/15/2021   Hypomagnesemia 05/15/2021   UTI (urinary tract infection) 05/14/2021   Acute exacerbation of chronic low back pain 03/10/2021   OSA on CPAP 09/03/2018   Mild persistent asthma without complication  09/03/2018   Class 3 severe obesity with serious comorbidity and body mass index (BMI) of 40.0 to 44.9 in adult (HCC) 04/14/2018   Unilateral primary osteoarthritis, left knee 04/14/2018   Asthma in adult, mild intermittent, uncomplicated 02/26/2017   Allergic rhinitis 09/05/2013    PCP: Dorinda Hill MD   REFERRING PROVIDER: Dorinda Hill MD   REFERRING DIAG: M54.50,G89.29 (ICD-10-CM) - Chronic right-sided low back pain without sciatica M47.816 (ICD-10-CM) - Spondylosis without myelopathy or radiculopathy, lumbar region M47.816 (ICD-10-CM) - Facet arthropathy, lumbar  Rationale for Evaluation and Treatment: Rehabilitation  THERAPY DIAG:  Chronic bilateral low back pain without sciatica  Difficulty in walking, not elsewhere classified  Unsteadiness on feet  Muscle weakness (generalized)  ONSET DATE: 6-8 weeks ago   SUBJECTIVE:  SUBJECTIVE STATEMENT:  Its hard to pinpoint anything I did, but the back pain just "occurred". Sometimes its not as bad as others, other times its very difficult to walk or get up and down. No radicular sx or numbness/tingling from the back. No new B/B incontinence, no sudden weight loss, no major sleep issues due to pain. Have tried some old exercises "moving this way and that" and it really made my pain worse. I've been wearing a back brace from [place] around the house.   PERTINENT HISTORY:  Anxiety, OA, HTN, obesity, SOB, knee surgery, shoulder surgery   PAIN:  Are you having pain? Yes: NPRS scale: 1-2/10 Pain location: "started on R side and then gradually moved over to middle"  Pain description: discomfort, "I know its there"  Aggravating factors: being up on feet Relieving factors: sitting down, heat   At worst can get to 7-9/10  PRECAUTIONS: ICD/Pacemaker  WEIGHT  BEARING RESTRICTIONS: No  FALLS:  Has patient fallen in last 6 months? No, some falls >6 months ago, mild FOF on uneven surfaces such as grass   LIVING ENVIRONMENT: Lives with: lives with their spouse Lives in: House/apartment handicap  Stairs: level entry in the back, no steps inside the home  Has following equipment at home: [employer]re[employer] - 2 wheeled and Grab bars  OCCUPATION: retired   PLOF: Independent, Independent with basic ADLs, Independent with gait, and Independent with transfers  PATIENT GOALS: get rid of back pain and be more steady on my feet   NEXT MD VISIT: PRN with referring   OBJECTIVE:   DIAGNOSTIC FINDINGS:  Lumbar spine grade 1 spondylolisthesis L4-5.  Lower lumbar facet changes.   No acute fractures or acute findings.  PATIENT SURVEYS:  FOTO 56, predicted 65 in 11 visits  SCREENING FOR RED FLAGS: Bowel or bladder incontinence: No Spinal tumors: No Cauda equina syndrome: No Compression fracture: No Abdominal aneurysm: No  COGNITION: Overall cognitive status: Within functional limits for tasks assessed     SENSATION: Not tested  MUSCLE LENGTH: Hamstrings and piriformis WNL B Hip flexors tight observation, not formally tested   POSTURE: rounded shoulders, forward head, increased lumbar lordosis, increased thoracic kyphosis, and flexed trunk   PALPATION:  No tenderness or mm spasms noted L spine in sitting   LUMBAR ROM:   AROM eval  Flexion WNL but very cautious movement, RFIS no change  Extension Hypermobile, deferred REIS due to hx of spondylolisthesis   Right lateral flexion WNL   Left lateral flexion WNL   Right rotation   Left rotation    (Blank rows = not tested)    LOWER EXTREMITY MMT:    MMT Right eval Left eval  Hip flexion 4 4-  Hip extension    Hip abduction 3 3  Hip adduction    Hip internal rotation    Hip external rotation    Knee flexion 4 4+  Knee extension 4- pain 3 pain  Ankle dorsiflexion 4 4+  Ankle  plantarflexion    Ankle inversion    Ankle eversion     (Blank rows = not tested)  LUMBAR SPECIAL TESTS:  Straight leg raise test: Negative  FUNCTIONAL TESTS:  Berg Balance Scale: 39/56    TODAY'S TREATMENT:  DATE:   Eval   Objective measures/POC/care planning   TherEX  Bridges + ABD into red TB x10 PPT x10 with 3 second holds LAQs with red TB x10 B with 2 second holds     PATIENT EDUCATION:  Education details: exam findings, POC, HEP  Person educated: Patient Education method: Explanation, Demonstration, and Handouts Education comprehension: verbalized understanding, returned demonstration, and needs further education  HOME EXERCISE PROGRAM: Access Code: [email or web address] URL: [email or web address] Date: 05/25/2022 Prepared by: Nedra Hai  Exercises - Supine Bridge with Resistance Band  - 2 x daily - 7 x weekly - 1 sets - 10 reps - 2 hold - Supine Posterior Pelvic Tilt  - 2 x daily - 7 x weekly - 1 sets - 10 reps - 3 hold - Sitting Knee Extension with Resistance  - 2 x daily - 7 x weekly - 1 sets - 10 reps - 2 hold  ASSESSMENT:  CLINICAL IMPRESSION: Patient is a 77 y.o. F who was seen today for physical therapy evaluation and treatment for low back pain. Exam reveals significant functional muscle weakness (primarily in legs and core), postural impairments, and significant balance impairments. Does have a hx of some vertigo, we can plan to address in PT with a new referral once her back and balance issues are taken care of. Will benefit from skilled PT services to address all impairments, reduce fall risk, and assist in return to optimal level of function.   OBJECTIVE IMPAIRMENTS: decreased balance, decreased mobility, difficulty walking, decreased strength, postural dysfunction, obesity, and pain.   ACTIVITY LIMITATIONS: standing,  squatting, stairs, transfers, and locomotion level  PARTICIPATION LIMITATIONS: shopping, community activity, yard work, and church  PERSONAL FACTORS: Age, Behavior pattern, Fitness, Past/current experiences, Sex, and Time since onset of injury/illness/exacerbation are also affecting patient's functional outcome.   REHAB POTENTIAL: Good  CLINICAL DECISION MAKING: Stable/uncomplicated  EVALUATION COMPLEXITY: Low   GOALS: Goals reviewed with patient? Yes  SHORT TERM GOALS: Target date: 06/22/2022    Will be complaint with appropriate progressive HEP  Baseline: Goal status: INITIAL  2.  Pain to be no more than 6/10 at worst  Baseline:  Goal status: INITIAL  3.  Will demonstrate improved biomechanics and posture with all functional tasks  Baseline:  Goal status: INITIAL   LONG TERM GOALS: Target date: 07/20/2022    MMT to improve by at least 1 grade in all weak groups  Baseline:  Goal status: INITIAL  2.  Will score at least 48 on Berg to show reduced overall fall risk  Baseline:  Goal status: INITIAL  3.  Pain to be no more than 4/10 at worst  Baseline:  Goal status: INITIAL  4.  Will demonstrate ability to ambulate over grass/gravel no device with minimal fall risk  Baseline:  Goal status: INITIAL  5.  Will be able to complete all house keeping tasks and daily activities without significant increase in back pain from resting level  Baseline:  Goal status: INITIAL  6.  FOTO score to be within 5 points of goal level by DC  Baseline:  Goal status: INITIAL  PLAN:  PT FREQUENCY: 1x/week  PT DURATION: 8 weeks  PLANNED INTERVENTIONS: Therapeutic exercises, Therapeutic activity, Neuromuscular re-education, Balance training, Gait training, Patient/Family education, Self Care, Joint mobilization, Stair training, Aquatic Therapy, Dry Needling, Electrical stimulation, Cryotherapy, Moist heat, Taping, Ionotophoresis 4mg /ml Dexamethasone, Manual therapy, and  Re-evaluation.  PLAN FOR NEXT SESSION: LE and core strength, caution with extension due to hx spondylolisthesis, balance.  Coming 1x/week due to busy schedule with MD appointments, needs weekly HEP updates   Nedra Hai PT DPT PN2

## 2022-05-26 ENCOUNTER — Other Ambulatory Visit (INDEPENDENT_AMBULATORY_CARE_PROVIDER_SITE_OTHER): Payer: Self-pay | Admitting: Family Medicine

## 2022-05-26 DIAGNOSIS — R7303 Prediabetes: Secondary | ICD-10-CM

## 2022-05-28 ENCOUNTER — Other Ambulatory Visit: Payer: Self-pay | Admitting: Orthopaedic Surgery

## 2022-05-31 ENCOUNTER — Ambulatory Visit (INDEPENDENT_AMBULATORY_CARE_PROVIDER_SITE_OTHER): Payer: Medicare Other | Admitting: Family Medicine

## 2022-05-31 ENCOUNTER — Encounter (INDEPENDENT_AMBULATORY_CARE_PROVIDER_SITE_OTHER): Payer: Self-pay | Admitting: Family Medicine

## 2022-05-31 VITALS — BP 114/70 | HR 82 | Temp 97.5°F | Ht 64.0 in | Wt 219.0 lb

## 2022-05-31 DIAGNOSIS — Z6837 Body mass index (BMI) 37.0-37.9, adult: Secondary | ICD-10-CM

## 2022-05-31 DIAGNOSIS — I10 Essential (primary) hypertension: Secondary | ICD-10-CM

## 2022-05-31 DIAGNOSIS — R7303 Prediabetes: Secondary | ICD-10-CM | POA: Diagnosis not present

## 2022-05-31 DIAGNOSIS — E559 Vitamin D deficiency, unspecified: Secondary | ICD-10-CM

## 2022-05-31 DIAGNOSIS — Z6841 Body Mass Index (BMI) 40.0 and over, adult: Secondary | ICD-10-CM

## 2022-05-31 MED ORDER — METFORMIN HCL 500 MG PO TABS
ORAL_TABLET | ORAL | 0 refills | Status: AC
Start: 2022-05-31 — End: ?

## 2022-05-31 NOTE — Progress Notes (Signed)
Crystal Jackson, D.O.  ABFM, ABOM Specializing in Clinical Bariatric Medicine  Office located at: [address] [place], [place]  [address]     Assessment and Plan:   Medications Discontinued During This Encounter  Medication Reason   metFORMIN (GLUCOPHAGE) 500 MG tablet Reorder     Meds ordered this encounter  Medications   metFORMIN (GLUCOPHAGE) 500 MG tablet    Sig: 1 po with lunch and dinner    Dispense:  180 tablet    Refill:  0    **Patient requests 90 days supply**     Essential hypertension Assessment: Condition is stable.  Last 3 blood pressure readings in our office are as follows: BP Readings from Last 3 Encounters:  05/31/22 114/70  05/01/22 125/73  03/20/22 126/77  - She has been compliant with Lopressor 50 mg BID and Aldactone 25 mg daily. Denies any side effects. - Her blood pressure is stable today, no concerns.   Plan: - Continue with all antihypertensive medications as recommended by pcp/specialist.  - Continue with Prudent nutritional plan and low sodium diet, advance exercise as tolerated.   - Will continue to monitor alongside PCP.    Pre-diabetes Assessment: Condition is not optimized.  Lab Results  Component Value Date   HGBA1C 5.7 (H) 01/29/2022   HGBA1C 5.7 (H) 09/27/2021   HGBA1C 5.7 (H) 02/07/2021   INSULIN 18.4 09/27/2021   INSULIN 17.5 02/07/2021   INSULIN 17.7 06/29/2020  - She has been mostly compliant and tolerant with Metformin 500 mg BID. Denies any adverse effects. She forgets to take her Metformin every now and then.  - Her hunger and cravings are controlled when eating on plan.   Plan: - Continue with med. Will refill this today.   - Continue her prudent nutritional plan that is low in simple carbohydrates, saturated fats and trans fats to goal of 5-10% weight loss to achieve significant health benefits.  Pt encouraged to continually advance exercise and cardiovascular fitness as tolerated throughout weight loss  journey.     Vitamin D deficiency Assessment: Condition is stable. Lab Results  Component Value Date   VD25OH 50.7 01/29/2022   VD25OH 47.7 09/27/2021   VD25OH 55.9 02/07/2021  - Compliance of OTC Vitamin D 600-400 mg 1 po qd good since last labs reviewed. Denies any adverse effects.   Plan: - Continue with OTC supplement.  - Will continue to monitor levels regularly (every 3-4 mo on average) to keep levels within normal limits and prevent over supplementation.   TREATMENT PLAN FOR OBESITY: BMI 40.0-44.9, adult (HCC)-current bmi 37.57 Morbid obesity (HCC)-start bmi 41.54/date 07/09/2018 Assessment:  Crystal Jackson is here to discuss her progress with her obesity treatment plan along with follow-up of her obesity related diagnoses. See Medical Weight Management Flowsheet for complete bioelectrical impedance results.  Condition is improving.  Biometric data collected today, was reviewed with patient.   Since last office visit on 05/01/22 patient's  Muscle mass has increased by 1 lb. Fat mass has decreased by 2 lb. Total body water has increased by 1 lb.  Counseling done on how various foods will affect these numbers and how to maximize success  Total lbs lost to date: 23  Total weight loss percentage to date: 9.50    Plan:  - Continue Category 1 meal plan with breakfast and lunch options and  keeping a food journal and adhering to recommended goals of 1150-1250 calories and 85+ protein.   Behavioral Intervention Additional resources provided  today: lunch options and crock-pot recipe handouts  Evidence-based interventions for health behavior change were utilized today including the discussion of self monitoring techniques, problem-solving barriers and SMART goal setting techniques.   Regarding patient's less desirable eating habits and patterns, we employed the technique of small changes.  Pt will specifically work on: continue adherence to prudent nutritional plan and walking 5 minutes  every other day on flat terrain for next visit.    Recommended Physical Activity Goals  Latreece has been advised to slowly work up to 150 minutes of moderate intensity aerobic activity a week and strengthening exercises 2-3 times per week for cardiovascular health, weight loss maintenance and preservation of muscle mass.   She has agreed to Continue current level of physical activity    FOLLOW UP: Return in about 3 months (around 08/31/2022). She was informed of the importance of frequent follow up visits to maximize her success with intensive lifestyle modifications for her multiple health conditions.   Subjective:   Chief complaint: Obesity Jaidence is here to discuss her progress with her obesity treatment plan. She is on the the Category 1 Plan and keeping a food journal and adhering to recommended goals of 1150-1250  calories and 85+ protein and states she is following her eating plan approximately 50% of the time. She states she is not exercising.   Interval History:  Crystal Jackson is here for a follow up office visit.     Since last office visit:   - She has restarted eating salads with the skinny girl dressing.  - Pt endorses that she has been following the Category 1 meal plan and not journaling.  - Her biggest challenge is buying/cooking foods that she and her husband can both eat for lunch.   - Endorses that her vertigo has been under good control. - Reports that her back issues and knee pain have been an obstacle for her to walking regularly. - Showed pt the [facility] and Dr.Ramos of [facility], who can help with her back issues.    Pharmacotherapy for weight loss: She is currently taking  Metformin  for medical weight loss.  Denies side effects.    Review of Systems:  Pertinent positives were addressed with patient today.  Weight Summary and Biometrics   Weight Lost Since Last Visit: 1 lb  Weight Gained Since Last Visit: 0    Vitals Temp: (!) 97.5  F (36.4 C) BP: 114/70 Pulse Rate: 82 SpO2: 95 %   Anthropometric Measurements Height: 5\' 4"  (1.626 m) Weight: 219 lb (99.3 kg) BMI (Calculated): 37.57 Weight at Last Visit: 220 lb Weight Lost Since Last Visit: 1 lb Weight Gained Since Last Visit: 0 Starting Weight: 242 lb Total Weight Loss (lbs): 23 lb (10.4 kg) Peak Weight: 242 lb   Body Composition  Body Fat %: 48.1 % Fat Mass (lbs): 105.8 lbs Muscle Mass (lbs): 108.2 lbs Total Body Water (lbs): 84.4 lbs Visceral Fat Rating : 16   Other Clinical Data Fasting: No Labs: No Today's Visit #: 68 Starting Date: 07/09/18   Objective:   PHYSICAL EXAM: Blood pressure 114/70, pulse 82, temperature (!) 97.5 F (36.4 C), height 5\' 4"  (1.626 m), weight 219 lb (99.3 kg), SpO2 95 %. Body mass index is 37.59 kg/m.  General: Well Developed, well nourished, and in no acute distress.  HEENT: Normocephalic, atraumatic Skin: Warm and dry, cap RF less 2 sec, good turgor Chest:  Normal excursion, shape, no gross abn Respiratory: speaking in full  sentences, no conversational dyspnea NeuroM-Sk: Ambulates w/o assistance, moves * 4 Psych: A and O *3, insight good, mood-full  DIAGNOSTIC DATA REVIEWED:  BMET    Component Value Date/Time   NA 135 01/29/2022 1304   K 4.8 01/29/2022 1304   CL 97 01/29/2022 1304   CO2 25 01/29/2022 1304   GLUCOSE 92 01/29/2022 1304   GLUCOSE 113 (H) 05/16/2021 0541   BUN 19 01/29/2022 1304   CREATININE 1.06 (H) 01/29/2022 1304   CALCIUM 9.6 01/29/2022 1304   GFRNONAA >60 05/16/2021 0541   GFRAA 62 10/19/2019 0951   Lab Results  Component Value Date   HGBA1C 5.7 (H) 01/29/2022   HGBA1C 5.8 (H) 07/09/2018   Lab Results  Component Value Date   INSULIN 18.4 09/27/2021   INSULIN 25.0 (H) 07/09/2018   Lab Results  Component Value Date   TSH 1.199 05/14/2021   CBC    Component Value Date/Time   WBC 9.3 05/16/2021 0919   RBC 3.77 (L) 05/16/2021 0919   HGB 11.5 (L) 05/16/2021 0919    HGB 13.6 07/09/2018 1609   HCT 34.5 (L) 05/16/2021 0919   HCT 39.7 07/09/2018 1609   PLT 148 (L) 05/16/2021 0919   PLT 241 07/09/2018 1609   MCV 91.5 05/16/2021 0919   MCV 89 07/09/2018 1609   MCH 30.5 05/16/2021 0919   MCHC 33.3 05/16/2021 0919   RDW 13.2 05/16/2021 0919   RDW 13.1 07/09/2018 1609   Iron Studies No results found for: "IRON", "TIBC", "FERRITIN", "IRONPCTSAT" Lipid Panel     Component Value Date/Time   CHOL 149 09/27/2021 1141   TRIG 80 09/27/2021 1141   HDL 60 09/27/2021 1141   CHOLHDL 2.5 09/27/2021 1141   LDLCALC 74 09/27/2021 1141   Hepatic Function Panel     Component Value Date/Time   PROT 7.0 09/27/2021 1141   ALBUMIN 4.5 09/27/2021 1141   AST 24 09/27/2021 1141   ALT 22 09/27/2021 1141   ALKPHOS 45 09/27/2021 1141   BILITOT 0.4 09/27/2021 1141   BILIDIR 0.1 05/14/2021 2238   IBILI 0.4 05/14/2021 2238      Component Value Date/Time   TSH 1.199 05/14/2021 2238   TSH 2.210 07/09/2018 1609   Nutritional Lab Results  Component Value Date   VD25OH 50.7 01/29/2022   VD25OH 47.7 09/27/2021   VD25OH 55.9 02/07/2021    Attestations:   Reviewed by clinician on day of visit: allergies, medications, problem list, medical history, surgical history, family history, social history, and previous encounter notes.    I,Special Puri,acting as a Neurosurgeon for [employer], DO.,have documented all relevant documentation on the behalf of Thomasene Lot, DO,as directed by  Thomasene Lot, DO while in the presence of Thomasene Lot, DO.   I, Thomasene Lot, DO, have reviewed all documentation for this visit. The documentation on 05/31/22 for the exam, diagnosis, procedures, and orders are all accurate and complete.

## 2022-06-01 NOTE — Therapy (Signed)
OUTPATIENT PHYSICAL THERAPY TREATMENT NOTE   Patient Name: Sharmika Parody MRN: [id number] DOB:[birth year], 77 y.o., female Today's Date: 06/04/2022  END OF SESSION:  PT End of Session - 06/04/22 1145     Visit Number 2    Number of Visits 9    Date for PT Re-Evaluation 07/20/22    Authorization Type [employer] Medicare    Authorization Time Period 05/25/22 to 07/20/22    Progress Note Due on Visit 10    PT Start Time 1146    PT Stop Time 1229    PT Time Calculation (min) 43 min    Activity Tolerance Patient tolerated treatment well;No increased pain    Behavior During Therapy WFL for tasks assessed/performed              Past Medical History:  Diagnosis Date   Anxiety    Arthritis    Asthma    Back pain    Bilateral swelling of feet    Constipation    Depression    Gallbladder problem    GERD (gastroesophageal reflux disease)    History of low potassium    Hypertension    Joint pain    Obesity    Palpitations    Shortness of breath    Sleep apnea    Past Surgical History:  Procedure Laterality Date   ABDOMINAL HYSTERECTOMY     APPENDECTOMY     CHOLECYSTECTOMY     KNEE SURGERY Left    SHOULDER SURGERY Right    Patient Active Problem List   Diagnosis Date Noted   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   BMI 40.0-44.9, adult (HCC)-current bmi 37.3 03/20/2022   Morbid obesity (HCC) 01/30/2022   BMI 37.0-37.9, adult 01/30/2022   B12 deficiency due to diet 09/27/2021   SOB (shortness of breath) on exertion 09/27/2021   Chronic vertigo 09/27/2021   Other hyperlipidemia 08/28/2021   Vitamin D deficiency 08/28/2021   Pre-diabetes 06/28/2021   Sepsis (HCC) 05/15/2021   Hypoxia 05/15/2021   Essential hypertension 05/15/2021   DM (diabetes mellitus), type 2 (HCC) 05/15/2021   Hypomagnesemia 05/15/2021   UTI (urinary tract infection) 05/14/2021   Acute exacerbation of chronic low back pain 03/10/2021   OSA on CPAP 09/03/2018   Mild persistent asthma without  complication 09/03/2018   Class 3 severe obesity with serious comorbidity and body mass index (BMI) of 40.0 to 44.9 in adult (HCC) 04/14/2018   Unilateral primary osteoarthritis, left knee 04/14/2018   Asthma in adult, mild intermittent, uncomplicated 02/26/2017   Allergic rhinitis 09/05/2013    PCP: Dorinda Hill MD   REFERRING PROVIDER: Dorinda Hill MD   REFERRING DIAG: M54.50,G89.29 (ICD-10-CM) - Chronic right-sided low back pain without sciatica M47.816 (ICD-10-CM) - Spondylosis without myelopathy or radiculopathy, lumbar region M47.816 (ICD-10-CM) - Facet arthropathy, lumbar  Rationale for Evaluation and Treatment: Rehabilitation  THERAPY DIAG:  Chronic bilateral low back pain without sciatica  Difficulty in walking, not elsewhere classified  Muscle weakness (generalized)  ONSET DATE: 6-8 weeks ago   SUBJECTIVE:  Per eval - Its hard to pinpoint anything I did, but the back pain just "occurred". Sometimes its not as bad as others, other times its very difficult to walk or get up and down. No radicular sx or numbness/tingling from the back. No new B/B incontinence, no sudden weight loss, no major sleep issues due to pain. Have tried some old exercises "moving this way and that" and it really made my pain worse. I've been wearing a back brace from [place] around the house.   SUBJECTIVE STATEMENT: 06/04/2022 Pt states she hasn't been able to do HEP much, misplaced her exercise sheet initially and has been busy. 3/10 pain at present, back/hips/knees. No other new updates   PERTINENT HISTORY:  Anxiety, OA, HTN, obesity, SOB, knee surgery, shoulder surgery   PAIN:  Are you having pain? Yes: NPRS scale: 1-2/10 Pain location: "started on R side and then gradually moved over to middle"  Pain description:  discomfort, "I know its there"  Aggravating factors: being up on feet Relieving factors: sitting down, heat   At worst can get to 7-9/10  PRECAUTIONS: ICD/Pacemaker  WEIGHT BEARING RESTRICTIONS: No  FALLS:  Has patient fallen in last 6 months? No, some falls >6 months ago, mild FOF on uneven surfaces such as grass   LIVING ENVIRONMENT: Lives with: lives with their spouse Lives in: House/apartment handicap  Stairs: level entry in the back, no steps inside the home  Has following equipment at home: Dan Humphreys - 2 wheeled and Grab bars  OCCUPATION: retired   PLOF: Independent, Independent with basic ADLs, Independent with gait, and Independent with transfers  PATIENT GOALS: get rid of back pain and be more steady on my feet   NEXT MD VISIT: PRN with referring   OBJECTIVE: (objective measures completed at initial evaluation unless otherwise dated)   DIAGNOSTIC FINDINGS:  Lumbar spine grade 1 spondylolisthesis L4-5.  Lower lumbar facet changes.   No acute fractures or acute findings.  PATIENT SURVEYS:  FOTO 56, predicted 65 in 11 visits  SCREENING FOR RED FLAGS: Bowel or bladder incontinence: No Spinal tumors: No Cauda equina syndrome: No Compression fracture: No Abdominal aneurysm: No  COGNITION: Overall cognitive status: Within functional limits for tasks assessed     SENSATION: Not tested  MUSCLE LENGTH: Hamstrings and piriformis WNL B Hip flexors tight observation, not formally tested   POSTURE: rounded shoulders, forward head, increased lumbar lordosis, increased thoracic kyphosis, and flexed trunk   PALPATION:  No tenderness or mm spasms noted L spine in sitting   LUMBAR ROM:   AROM eval  Flexion WNL but very cautious movement, RFIS no change  Extension Hypermobile, deferred REIS due to hx of spondylolisthesis   Right lateral flexion WNL   Left lateral flexion WNL   Right rotation   Left rotation    (Blank rows = not tested)    LOWER EXTREMITY MMT:     MMT Right eval Left eval  Hip flexion 4 4-  Hip extension    Hip abduction 3 3  Hip adduction    Hip internal rotation    Hip external rotation    Knee flexion 4 4+  Knee extension 4- pain 3 pain  Ankle dorsiflexion 4 4+  Ankle plantarflexion    Ankle inversion    Ankle eversion     (Blank rows = not tested)  LUMBAR SPECIAL TESTS:  Straight leg raise test: Negative  FUNCTIONAL TESTS:  Berg Balance Scale: 39/56    TODAY'S TREATMENT:  [facility] Adult PT Treatment:                                                DATE: 06/04/22 Therapeutic Exercise: LAQ red band x10 BIL cues for setup Hooklying posterior pelvic tilt 2x10 cues for breath control and mechanics Bridge 2x10 cues for appropriate ROM and breath control Hooklying adduction iso x10 cues for breath control and pacing Adduction iso sitting unsupported x10 cues for breath control and posture Seated hip abduction iso red band 2x10 cues for posture and setup 5# suitcase carry 3x28ft BIL cues for posture and pacing  HEP update + handout/education, discussion/education on strategies to maximize adherence     PATIENT EDUCATION:  Education details:  rationale for interventions, HEP update Person educated: Patient Education method: Explanation, Demonstration, and Handouts Education comprehension: verbalized understanding, returned demonstration, and needs further education  HOME EXERCISE PROGRAM: Access Code: [email or web address] URL: [email or web address] Date: 06/04/2022 Prepared by: Fransisco Hertz  Exercises - Supine Posterior Pelvic Tilt  - 2-3 x daily - 7 x weekly - 1 sets - 10 reps - 3 hold - Sitting Knee Extension with Resistance  - 2-3 x daily - 7 x weekly - 1 sets - 10 reps - 2 hold - Seated Hip Abduction with Resistance  - 2-3 x daily - 7 x weekly - 1 sets - 10 reps - Seated Hip  Adduction Isometrics with Ball  - 2-3 x daily - 7 x weekly - 1 sets - 10 reps  ASSESSMENT:  CLINICAL IMPRESSION: 06/04/2022 Pt arrives w/ 3/10 pain on NPS in low back and B hips/knees. Pt endorses limited ability to perform HEP at home, today beginning with HEP review, most difficulty w/ LAQ due to quad weakness and anterior knee pain. Pt tolerates session well overall without increase in resting pain, reports some muscular fatigue with suitcase carries but non painful. No adverse events. Continues to demo generalized weakness and gait impairments. Recommend continuing along current POC in order to address relevant deficits and improve functional tolerance. Pt departs today's session in no acute distress, all voiced questions/concerns addressed appropriately from PT perspective.     Per eval - Patient is a 76 y.o. F who was seen today for physical therapy evaluation and treatment for low back pain. Exam reveals significant functional muscle weakness (primarily in legs and core), postural impairments, and significant balance impairments. Does have a hx of some vertigo, we can plan to address in PT with a new referral once her back and balance issues are taken care of. Will benefit from skilled PT services to address all impairments, reduce fall risk, and assist in return to optimal level of function.   OBJECTIVE IMPAIRMENTS: decreased balance, decreased mobility, difficulty walking, decreased strength, postural dysfunction, obesity, and pain.   ACTIVITY LIMITATIONS: standing, squatting, stairs, transfers, and locomotion level  PARTICIPATION LIMITATIONS: shopping, community activity, yard work, and church  PERSONAL FACTORS: Age, Behavior pattern, Fitness, Past/current experiences, Sex, and Time since onset of injury/illness/exacerbation are also affecting patient's functional outcome.   REHAB POTENTIAL: Good  CLINICAL DECISION MAKING: Stable/uncomplicated  EVALUATION COMPLEXITY:  Low   GOALS: Goals reviewed with patient? Yes  SHORT TERM GOALS: Target date: 06/22/2022    Will be complaint with appropriate progressive HEP  Baseline: Goal status: INITIAL  2.  Pain to be no more than 6/10 at worst  Baseline:  Goal status: INITIAL  3.  Will demonstrate improved biomechanics and posture with all functional tasks  Baseline:  Goal status: INITIAL   LONG TERM GOALS: Target date: 07/20/2022    MMT to improve by at least 1 grade in all weak groups  Baseline:  Goal status: INITIAL  2.  Will score at least 48 on Berg to show reduced overall fall risk  Baseline:  Goal status: INITIAL  3.  Pain to be no more than 4/10 at worst  Baseline:  Goal status: INITIAL  4.  Will demonstrate ability to ambulate over grass/gravel no device with minimal fall risk  Baseline:  Goal status: INITIAL  5.  Will be able to complete all house keeping tasks and daily activities without significant increase in back pain from resting level  Baseline:  Goal status: INITIAL  6.  FOTO score to be within 5 points of goal level by DC  Baseline:  Goal status: INITIAL  PLAN:  PT FREQUENCY: 1x/week  PT DURATION: 8 weeks  PLANNED INTERVENTIONS: Therapeutic exercises, Therapeutic activity, Neuromuscular re-education, Balance training, Gait training, Patient/Family education, Self Care, Joint mobilization, Stair training, Aquatic Therapy, Dry Needling, Electrical stimulation, Cryotherapy, Moist heat, Taping, Ionotophoresis 4mg /ml Dexamethasone, Manual therapy, and Re-evaluation.  PLAN FOR NEXT SESSION: LE and core strength, caution with extension due to hx spondylolisthesis, balance. Coming 1x/week due to busy schedule with MD appointments, needs weekly HEP updates      Ashley Murrain PT, DPT 06/04/2022 12:52 PM

## 2022-06-04 ENCOUNTER — Ambulatory Visit: Payer: Medicare Other | Admitting: Physical Therapy

## 2022-06-04 ENCOUNTER — Encounter: Payer: Self-pay | Admitting: Physical Therapy

## 2022-06-04 DIAGNOSIS — M6281 Muscle weakness (generalized): Secondary | ICD-10-CM | POA: Diagnosis not present

## 2022-06-04 DIAGNOSIS — G8929 Other chronic pain: Secondary | ICD-10-CM | POA: Diagnosis not present

## 2022-06-04 DIAGNOSIS — R262 Difficulty in walking, not elsewhere classified: Secondary | ICD-10-CM

## 2022-06-04 DIAGNOSIS — M545 Low back pain, unspecified: Secondary | ICD-10-CM | POA: Diagnosis not present

## 2022-06-05 ENCOUNTER — Ambulatory Visit: Payer: Medicare Other | Admitting: Physician Assistant

## 2022-06-05 ENCOUNTER — Encounter: Payer: Self-pay | Admitting: Physician Assistant

## 2022-06-05 DIAGNOSIS — M1712 Unilateral primary osteoarthritis, left knee: Secondary | ICD-10-CM | POA: Diagnosis not present

## 2022-06-05 DIAGNOSIS — M1711 Unilateral primary osteoarthritis, right knee: Secondary | ICD-10-CM

## 2022-06-05 MED ORDER — LIDOCAINE HCL 1 % IJ SOLN
3.0000 mL | INTRAMUSCULAR | Status: AC | PRN
Start: 2022-06-05 — End: 2022-06-05
  Administered 2022-06-05: 3 mL

## 2022-06-05 MED ORDER — METHYLPREDNISOLONE ACETATE 40 MG/ML IJ SUSP
40.0000 mg | INTRAMUSCULAR | Status: AC | PRN
Start: 2022-06-05 — End: 2022-06-05
  Administered 2022-06-05: 40 mg via INTRA_ARTICULAR

## 2022-06-05 NOTE — Progress Notes (Signed)
Office Visit Note   [name]           Date of Birth: June 15, 1945           MRN: [id number] Visit Date: 06/05/2022              Requested by: Melida Quitter, MD [address] [place],  [place] [address] PCP: Melida Quitter, MD  Chief Complaint  Patient presents with  . Right Knee - Pain  . Left Knee - Pain      HPI: Patient is a 77 year old woman who is a patient of [facility]'s.  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  Periodically comes in for steroid injection.  Last injection was over 4 months ago.  Requesting injections today no recent injury just says her knees are starting to hurt  Assessment & Plan: Visit Diagnoses: Osteoarthritis bilateral knees  Plan: Micah Flesher forward with an injection without any difficulty she thinks at some point she is going to consider knee replacement I recommended following up with Dr. Magnus Ivan to discuss this further  Follow-Up Instructions: No follow-ups on file.   Ortho Exam  Patient is alert, oriented, no adenopathy, well-dressed, normal affect, normal respiratory effort. Bilateral knees no effusion no erythema no warmth.  Compartments are soft and nontender she is neurovascularly intact no evidence of infection  Imaging: No results found. No images are attached to the encounter.  Labs: Lab Results  Component Value Date   HGBA1C 5.7 (H) 01/29/2022   HGBA1C 5.7 (H) 09/27/2021   HGBA1C 5.7 (H) 02/07/2021   REPTSTATUS 05/20/2021 FINAL 05/14/2021   CULT  05/14/2021    NO GROWTH 5 DAYS Performed at [facility], [address] [place], [place] [address]    LABORGA ESCHERICHIA COLI (A) 05/14/2021     Lab Results  Component Value Date   ALBUMIN 4.5 09/27/2021   ALBUMIN 3.1 (L) 05/15/2021   ALBUMIN 3.2 (L) 05/14/2021   PREALBUMIN 17.2 (L) 05/15/2021    Lab Results  Component Value Date   MG 2.0 09/27/2021   MG 1.8 05/15/2021   MG 1.4 (L) 05/14/2021   Lab Results  Component Value Date    VD25OH 50.7 01/29/2022   VD25OH 47.7 09/27/2021   VD25OH 55.9 02/07/2021    Lab Results  Component Value Date   PREALBUMIN 17.2 (L) 05/15/2021      Latest Ref Rng & Units 05/16/2021    9:19 AM 05/15/2021    2:46 AM 05/14/2021    4:19 PM  CBC EXTENDED  WBC 4.0 - 10.5 K/uL 9.3  16.3  4.7   RBC 3.87 - 5.11 MIL/uL 3.77  3.61  4.48   Hemoglobin 12.0 - 15.0 g/dL 13.0  86.5  78.4   HCT 36.0 - 46.0 % 34.5  32.2  38.8   Platelets 150 - 400 K/uL 148  139  162      There is no height or weight on file to calculate BMI.  Orders:  No orders of the defined types were placed in this encounter.  No orders of the defined types were placed in this encounter.    Procedures: Large Joint Inj: bilateral knee on 06/05/2022 10:55 AM Indications: pain and diagnostic evaluation Details: 25 G 1.5 in needle, anteromedial approach  Arthrogram: No  Medications (Right): 3 mL lidocaine 1 %; 40 mg methylPREDNISolone acetate 40 MG/ML Medications (Left): 3 mL lidocaine 1 %; 40 mg methylPREDNISolone acetate 40 MG/ML Outcome: tolerated well,  no immediate complications Procedure, treatment alternatives, risks and benefits explained, specific risks discussed. Consent was given by the patient.    Clinical Data: No additional findings.  ROS:  All other systems negative, except as noted in the HPI. Review of Systems  Objective: Vital Signs: There were no vitals taken for this visit.  Specialty Comments:  EXAM: MRI LUMBAR SPINE WITHOUT CONTRAST   TECHNIQUE: Multiplanar, multisequence MR imaging of the lumbar spine was performed. No intravenous contrast was administered.   COMPARISON:  Lumbar spine radiographs 02/05/2022, lumbar spine MRI 10/10/2018   FINDINGS: Segmentation: Standard; the lowest formed disc space is designated L5-S1.   Alignment: There is dextrocurvature centered at L2-L3 which appears increased since 2020. Trace retrolisthesis of T12 on L1 and trace anterolisthesis of L4 on  L5 are unchanged.   Vertebrae: Vertebral body heights are preserved. Background marrow signal is normal. There is no suspicious marrow signal abnormality or marrow edema.   Conus medullaris and cauda equina: Conus extends to the L1 level. Conus and cauda equina appear normal. An incidental conjoined right L5-S1 nerve root is noted. 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   Paraspinal and other soft tissues: Unremarkable.   Disc levels:   There is mild disc desiccation and narrowing at T12-L1. There is otherwise overall mild desiccation without significant loss of height.   T12-L1: There is a mild disc bulge without significant spinal canal or neural foraminal stenosis, unchanged.   L1-L2: No significant spinal canal or neural foraminal stenosis   L2-L3: There is a mild disc bulge which is minimally increased since 2020 but without significant spinal canal or neural foraminal stenosis   L3-L4: There is a mild disc bulge with a superimposed right foraminal/extraforaminal protrusion which comes in close proximity to the exiting L3 nerve root but without significant spinal canal or neural foraminal stenosis, unchanged.   L4-L5: There is trace anterolisthesis, and advanced left worse than right facet arthropathy with effusions resulting in mild left subarticular zone narrowing and no significant neural foraminal stenosis. The left subarticular zone narrowing is slightly worsened since 2020.   L5-S1: There is moderate bilateral facet arthropathy without significant spinal canal or neural foraminal stenosis, not significantly changed.   IMPRESSION: 1. 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 2. Moderate facet arthropathy at L5-S1 without significant spinal canal or neural foraminal stenosis, not significantly changed. 3.  Right foraminal/extraforaminal protrusion at L3-L4 which comes in close proximity to the exiting L3 nerve root, unchanged. 4. Dextrocurvature centered at L2-L3 appears increased since 2020.     Electronically Signed   By: Lesia Hausen M.D.   On: 05/29/2022 17:08  PMFS History: Patient Active Problem List   Diagnosis Date Noted  . 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  . BMI 40.0-44.9, adult (HCC)-current bmi 37.3 03/20/2022  . Morbid obesity (HCC) 01/30/2022  . BMI 37.0-37.9, adult 01/30/2022  . B12 deficiency due to diet 09/27/2021  . SOB (shortness of breath) on exertion 09/27/2021  . Chronic vertigo 09/27/2021  . Other hyperlipidemia 08/28/2021  . Vitamin D deficiency 08/28/2021  . Pre-diabetes 06/28/2021  . Sepsis (HCC) 05/15/2021  . Hypoxia 05/15/2021  . Essential hypertension 05/15/2021  . DM (diabetes mellitus), type 2 (HCC) 05/15/2021  . Hypomagnesemia 05/15/2021  . UTI (urinary tract infection) 05/14/2021  . Acute exacerbation of chronic low back pain 03/10/2021  . OSA on CPAP 09/03/2018  .  Mild persistent asthma without complication 09/03/2018  . Class 3 severe obesity with serious comorbidity and body mass index (BMI) of 40.0 to 44.9 in adult (HCC) 04/14/2018  . Unilateral primary osteoarthritis, left knee 04/14/2018  . Asthma in adult, mild intermittent, uncomplicated 02/26/2017  . Allergic rhinitis 09/05/2013   Past Medical History:  Diagnosis Date  . Anxiety   . Arthritis   . Asthma   . Back pain   . Bilateral swelling of feet   . Constipation   . Depression   . Gallbladder problem   . GERD (gastroesophageal reflux disease)   . History of low potassium   . Hypertension   . Joint pain   . Obesity   . Palpitations   . Shortness of breath   . Sleep apnea     Family History  Problem Relation Age of Onset  . Diabetes Mother   . Heart disease Mother   . Anxiety disorder Mother   . Obesity Mother   . Diabetes Father   . Hypertension Father   .  Obesity Father     Past Surgical History:  Procedure Laterality Date  . ABDOMINAL HYSTERECTOMY    . APPENDECTOMY    . CHOLECYSTECTOMY    . KNEE SURGERY Left   . SHOULDER SURGERY Right    Social History   Occupational History  . Not on file  Tobacco Use  . Smoking status: Never    Passive exposure: Never  . Smokeless tobacco: Never  Vaping Use  . Vaping Use: Never used  Substance and Sexual Activity  . Alcohol use: No    Alcohol/week: 0.0 standard drinks of alcohol  . Drug use: No  . Sexual activity: Yes

## 2022-06-14 NOTE — Therapy (Signed)
OUTPATIENT PHYSICAL THERAPY TREATMENT NOTE   Patient Name: Crystal Jackson MRN: [id number] DOB:[birth year], 77 y.o., female Today's Date: 06/15/2022  END OF SESSION:  PT End of Session - 06/15/22 1144     Visit Number 3    Number of Visits 9    Date for PT Re-Evaluation 07/20/22    Authorization Type [employer] Medicare    Authorization Time Period 05/25/22 to 07/20/22    Progress Note Due on Visit 10    PT Start Time 1146    PT Stop Time 1230    PT Time Calculation (min) 44 min    Activity Tolerance Patient tolerated treatment well;No increased pain    Behavior During Therapy WFL for tasks assessed/performed               Past Medical History:  Diagnosis Date   Anxiety    Arthritis    Asthma    Back pain    Bilateral swelling of feet    Constipation    Depression    Gallbladder problem    GERD (gastroesophageal reflux disease)    History of low potassium    Hypertension    Joint pain    Obesity    Palpitations    Shortness of breath    Sleep apnea    Past Surgical History:  Procedure Laterality Date   ABDOMINAL HYSTERECTOMY     APPENDECTOMY     CHOLECYSTECTOMY     KNEE SURGERY Left    SHOULDER SURGERY Right    Patient Active Problem List   Diagnosis Date Noted   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   BMI 40.0-44.9, adult (HCC)-current bmi 37.3 03/20/2022   Morbid obesity (HCC) 01/30/2022   BMI 37.0-37.9, adult 01/30/2022   B12 deficiency due to diet 09/27/2021   SOB (shortness of breath) on exertion 09/27/2021   Chronic vertigo 09/27/2021   Other hyperlipidemia 08/28/2021   Vitamin D deficiency 08/28/2021   Pre-diabetes 06/28/2021   Sepsis (HCC) 05/15/2021   Hypoxia 05/15/2021   Essential hypertension 05/15/2021   DM (diabetes mellitus), type 2 (HCC) 05/15/2021   Hypomagnesemia 05/15/2021   UTI (urinary tract infection) 05/14/2021   Acute exacerbation of chronic low back pain 03/10/2021   OSA on CPAP 09/03/2018   Mild persistent asthma without  complication 09/03/2018   Class 3 severe obesity with serious comorbidity and body mass index (BMI) of 40.0 to 44.9 in adult (HCC) 04/14/2018   Unilateral primary osteoarthritis, left knee 04/14/2018   Asthma in adult, mild intermittent, uncomplicated 02/26/2017   Allergic rhinitis 09/05/2013    PCP: Dorinda Hill MD   REFERRING PROVIDER: Dorinda Hill MD   REFERRING DIAG: M54.50,G89.29 (ICD-10-CM) - Chronic right-sided low back pain without sciatica M47.816 (ICD-10-CM) - Spondylosis without myelopathy or radiculopathy, lumbar region M47.816 (ICD-10-CM) - Facet arthropathy, lumbar  Rationale for Evaluation and Treatment: Rehabilitation  THERAPY DIAG:  Chronic bilateral low back pain without sciatica  Difficulty in walking, not elsewhere classified  Muscle weakness (generalized)  ONSET DATE: 6-8 weeks ago   SUBJECTIVE:  Per eval - Its hard to pinpoint anything I did, but the back pain just "occurred". Sometimes its not as bad as others, other times its very difficult to walk or get up and down. No radicular sx or numbness/tingling from the back. No new B/B incontinence, no sudden weight loss, no major sleep issues due to pain. Have tried some old exercises "moving this way and that" and it really made my pain worse. I've been wearing a back brace from [employer] around the house.   SUBJECTIVE STATEMENT: 06/15/2022 Pt states she got some knee injections which seemed helpful for about a week, now back to baseline. States her back has been feeling better overall, still having some rough days. "Not feeling bad" today, 4/10. HEP going well   PERTINENT HISTORY:  Anxiety, OA, HTN, obesity, SOB, knee surgery, shoulder surgery   PAIN:  Are you having pain? Yes: NPRS scale: 4/10 Pain location: "started on R side and  then gradually moved over to middle"  Pain description: discomfort, "I know its there"  Aggravating factors: being up on feet Relieving factors: sitting down, heat   At worst can get to 7-9/10  PRECAUTIONS: ICD/Pacemaker  WEIGHT BEARING RESTRICTIONS: No  FALLS:  Has patient fallen in last 6 months? No, some falls >6 months ago, mild FOF on uneven surfaces such as grass   LIVING ENVIRONMENT: Lives with: lives with their spouse Lives in: House/apartment handicap  Stairs: level entry in the back, no steps inside the home  Has following equipment at home: Dan Humphreys - 2 wheeled and Grab bars  OCCUPATION: retired   PLOF: Independent, Independent with basic ADLs, Independent with gait, and Independent with transfers  PATIENT GOALS: get rid of back pain and be more steady on my feet   NEXT MD VISIT: PRN with referring   OBJECTIVE: (objective measures completed at initial evaluation unless otherwise dated)   DIAGNOSTIC FINDINGS:  Lumbar spine grade 1 spondylolisthesis L4-5.  Lower lumbar facet changes.   No acute fractures or acute findings.  PATIENT SURVEYS:  FOTO 56, predicted 65 in 11 visits  SCREENING FOR RED FLAGS: Bowel or bladder incontinence: No Spinal tumors: No Cauda equina syndrome: No Compression fracture: No Abdominal aneurysm: No  COGNITION: Overall cognitive status: Within functional limits for tasks assessed     SENSATION: Not tested  MUSCLE LENGTH: Hamstrings and piriformis WNL B Hip flexors tight observation, not formally tested   POSTURE: rounded shoulders, forward head, increased lumbar lordosis, increased thoracic kyphosis, and flexed trunk   PALPATION:  No tenderness or mm spasms noted L spine in sitting   LUMBAR ROM:   AROM eval  Flexion WNL but very cautious movement, RFIS no change  Extension Hypermobile, deferred REIS due to hx of spondylolisthesis   Right lateral flexion WNL   Left lateral flexion WNL   Right rotation   Left rotation     (Blank rows = not tested)    LOWER EXTREMITY MMT:    MMT Right eval Left eval  Hip flexion 4 4-  Hip extension    Hip abduction 3 3  Hip adduction    Hip internal rotation    Hip external rotation    Knee flexion 4 4+  Knee extension 4- pain 3 pain  Ankle dorsiflexion 4 4+  Ankle plantarflexion    Ankle inversion    Ankle eversion     (Blank rows = not tested)  LUMBAR SPECIAL TESTS:  Straight leg raise test: Negative  FUNCTIONAL TESTS:  Sharlene Motts  Balance Scale: 39/56    TODAY'S TREATMENT:                                                                                                                              [facility] Adult PT Treatment:                                                DATE: 06/15/22 Therapeutic Exercise: Bridge 2x12 cues for breath control Posterior pelvic tilt 2x15 cues for sequencing Seated adductor isos 2x15 cues for form and posture STS from lowest mat  x5, raised mat x5 cues for BOS and pacing Suitcase carry 5# 4x74ft BIL cues for pacing Heel raises // bars 2x12 cues for reduced compensations  RTB hip ext in // bars x8 BIL cues for reduced trunk lean HEP update + education    [facility] Adult PT Treatment:                                                DATE: 06/04/22 Therapeutic Exercise: LAQ red band x10 BIL cues for setup Hooklying posterior pelvic tilt 2x10 cues for breath control and mechanics Bridge 2x10 cues for appropriate ROM and breath control Hooklying adduction iso x10 cues for breath control and pacing Adduction iso sitting unsupported x10 cues for breath control and posture Seated hip abduction iso red band 2x10 cues for posture and setup 5# suitcase carry 3x30ft BIL cues for posture and pacing  HEP update + handout/education, discussion/education on strategies to maximize adherence     PATIENT EDUCATION:  Education details:  rationale for interventions, HEP  Person educated: Patient Education method: Explanation, Demonstration, and  Handouts Education comprehension: verbalized understanding, returned demonstration, and needs further education  HOME EXERCISE PROGRAM: Access Code: [email or web address] URL: [email or web address] Date: 06/15/2022 Prepared by: Fransisco Hertz  Exercises - Supine Posterior Pelvic Tilt  - 2-3 x daily - 7 x weekly - 1 sets - 15 reps - 3 hold - Seated Hip Abduction with Resistance  - 2-3 x daily - 7 x weekly - 1 sets - 10 reps - Seated Hip Adduction Isometrics with Ball  - 2-3 x daily - 7 x weekly - 1 sets - 15 reps - Heel Raises with Counter Support  - 2-3 x daily - 7 x weekly - 1 sets - 10 reps  ASSESSMENT:  CLINICAL IMPRESSION: 06/15/2022 Pt arrives w/ 4/10 low back pain, states her back seems to be feeling a bit better overall. Today continuing to progress lumbopelvic program with some modifications as pt notes her knees continue to bother her a fair bit. No adverse events, pt endorses gradually improving symptoms as session goes on, resolution of pain by end  of session. HEP update as above. Recommend continuing along current POC in order to address relevant deficits and improve functional tolerance. Pt departs today's session in no acute distress, all voiced questions/concerns addressed appropriately from PT perspective.     Per eval - Patient is a 77 y.o. F who was seen today for physical therapy evaluation and treatment for low back pain. Exam reveals significant functional muscle weakness (primarily in legs and core), postural impairments, and significant balance impairments. Does have a hx of some vertigo, we can plan to address in PT with a new referral once her back and balance issues are taken care of. Will benefit from skilled PT services to address all impairments, reduce fall risk, and assist in return to optimal level of function.   OBJECTIVE IMPAIRMENTS: decreased balance, decreased mobility, difficulty walking, decreased strength, postural dysfunction, obesity, and pain.    ACTIVITY LIMITATIONS: standing, squatting, stairs, transfers, and locomotion level  PARTICIPATION LIMITATIONS: shopping, community activity, yard work, and church  PERSONAL FACTORS: Age, Behavior pattern, Fitness, Past/current experiences, Sex, and Time since onset of injury/illness/exacerbation are also affecting patient's functional outcome.   REHAB POTENTIAL: Good  CLINICAL DECISION MAKING: Stable/uncomplicated  EVALUATION COMPLEXITY: Low   GOALS: Goals reviewed with patient? Yes  SHORT TERM GOALS: Target date: 06/22/2022    Will be complaint with appropriate progressive HEP  Baseline: Goal status: INITIAL  2.  Pain to be no more than 6/10 at worst  Baseline:  Goal status: INITIAL  3.  Will demonstrate improved biomechanics and posture with all functional tasks  Baseline:  Goal status: INITIAL   LONG TERM GOALS: Target date: 07/20/2022    MMT to improve by at least 1 grade in all weak groups  Baseline:  Goal status: INITIAL  2.  Will score at least 48 on Berg to show reduced overall fall risk  Baseline:  Goal status: INITIAL  3.  Pain to be no more than 4/10 at worst  Baseline:  Goal status: INITIAL  4.  Will demonstrate ability to ambulate over grass/gravel no device with minimal fall risk  Baseline:  Goal status: INITIAL  5.  Will be able to complete all house keeping tasks and daily activities without significant increase in back pain from resting level  Baseline:  Goal status: INITIAL  6.  FOTO score to be within 5 points of goal level by DC  Baseline:  Goal status: INITIAL  PLAN:  PT FREQUENCY: 1x/week  PT DURATION: 8 weeks  PLANNED INTERVENTIONS: Therapeutic exercises, Therapeutic activity, Neuromuscular re-education, Balance training, Gait training, Patient/Family education, Self Care, Joint mobilization, Stair training, Aquatic Therapy, Dry Needling, Electrical stimulation, Cryotherapy, Moist heat, Taping, Ionotophoresis 4mg /ml  Dexamethasone, Manual therapy, and Re-evaluation.  PLAN FOR NEXT SESSION: LE and core strength, caution with extension due to hx spondylolisthesis, balance. Coming 1x/week due to busy schedule with MD appointments, needs weekly HEP updates       Ashley Murrain PT, DPT 06/15/2022 12:32 PM

## 2022-06-15 ENCOUNTER — Encounter: Payer: Self-pay | Admitting: Physical Therapy

## 2022-06-15 ENCOUNTER — Ambulatory Visit: Payer: Medicare Other | Admitting: Physical Therapy

## 2022-06-15 DIAGNOSIS — R262 Difficulty in walking, not elsewhere classified: Secondary | ICD-10-CM | POA: Diagnosis not present

## 2022-06-15 DIAGNOSIS — G8929 Other chronic pain: Secondary | ICD-10-CM | POA: Diagnosis not present

## 2022-06-15 DIAGNOSIS — M545 Low back pain, unspecified: Secondary | ICD-10-CM

## 2022-06-15 DIAGNOSIS — M6281 Muscle weakness (generalized): Secondary | ICD-10-CM

## 2022-06-20 NOTE — Therapy (Signed)
OUTPATIENT PHYSICAL THERAPY TREATMENT NOTE   Patient Name: Crystal Jackson MRN: [id number] DOB:Mar 20, 1945, 77 y.o., female Today's Date: 06/21/2022  END OF SESSION:  PT End of Session - 06/21/22 1142     Visit Number 4    Number of Visits 9    Date for PT Re-Evaluation 07/20/22    Authorization Type [employer]    Authorization Time Period 05/25/22 to 07/20/22    Progress Note Due on Visit 10    PT Start Time 1145    PT Stop Time 1231    PT Time Calculation (min) 46 min    Activity Tolerance Patient tolerated treatment well;No increased pain    Behavior During Therapy WFL for tasks assessed/performed                Past Medical History:  Diagnosis Date   Anxiety    Arthritis    Asthma    Back pain    Bilateral swelling of feet    Constipation    Depression    Gallbladder problem    GERD (gastroesophageal reflux disease)    History of low potassium    Hypertension    Joint pain    Obesity    Palpitations    Shortness of breath    Sleep apnea    Past Surgical History:  Procedure Laterality Date   ABDOMINAL HYSTERECTOMY     APPENDECTOMY     CHOLECYSTECTOMY     KNEE SURGERY Left    SHOULDER SURGERY Right    Patient Active Problem List   Diagnosis Date Noted   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   BMI 40.0-44.9, adult (HCC)-current bmi 37.3 03/20/2022   Morbid obesity (HCC) 01/30/2022   BMI 37.0-37.9, adult 01/30/2022   B12 deficiency due to diet 09/27/2021   SOB (shortness of breath) on exertion 09/27/2021   Chronic vertigo 09/27/2021   Other hyperlipidemia 08/28/2021   Vitamin D deficiency 08/28/2021   Pre-diabetes 06/28/2021   Sepsis (HCC) 05/15/2021   Hypoxia 05/15/2021   Essential hypertension 05/15/2021   DM (diabetes mellitus), type 2 (HCC) 05/15/2021   Hypomagnesemia 05/15/2021   UTI (urinary tract infection) 05/14/2021   Acute exacerbation of chronic low back pain 03/10/2021   OSA on CPAP 09/03/2018   Mild persistent asthma without  complication 09/03/2018   Class 3 severe obesity with serious comorbidity and body mass index (BMI) of 40.0 to 44.9 in adult (HCC) 04/14/2018   Unilateral primary osteoarthritis, left knee 04/14/2018   Asthma in adult, mild intermittent, uncomplicated 02/26/2017   Allergic rhinitis 09/05/2013    PCP: Dorinda Hill MD   REFERRING PROVIDER: Dorinda Hill MD   REFERRING DIAG: M54.50,G89.29 (ICD-10-CM) - Chronic right-sided low back pain without sciatica M47.816 (ICD-10-CM) - Spondylosis without myelopathy or radiculopathy, lumbar region M47.816 (ICD-10-CM) - Facet arthropathy, lumbar  Rationale for Evaluation and Treatment: Rehabilitation  THERAPY DIAG:  Chronic bilateral low back pain without sciatica  Difficulty in walking, not elsewhere classified  Muscle weakness (generalized)  Unsteadiness on feet  ONSET DATE: 6-8 weeks ago   SUBJECTIVE:  Per eval - Its hard to pinpoint anything I did, but the back pain just "occurred". Sometimes its not as bad as others, other times its very difficult to walk or get up and down. No radicular sx or numbness/tingling from the back. No new B/B incontinence, no sudden weight loss, no major sleep issues due to pain. Have tried some old exercises "moving this way and that" and it really made my pain worse. I've been wearing a back brace from [place] around the house.   SUBJECTIVE STATEMENT: 06/21/2022 Pt states her knees are feeling about the same as usual. About 4.5/10 pain at present, states her back has been a bit more uncomfortable this week which she attributes to more gardening. No increase in pain or soreness after last session. HEP going well, no issues.     PERTINENT HISTORY:  Anxiety, OA, HTN, obesity, SOB, knee surgery, shoulder surgery   PAIN:  Are you having  pain? Yes: NPRS scale: 4.5/10 Pain location: "started on R side and then gradually moved over to middle"  Pain description: discomfort, "I know its there"  Aggravating factors: being up on feet Relieving factors: sitting down, heat   At worst can get to 7-9/10  PRECAUTIONS: ICD/Pacemaker  WEIGHT BEARING RESTRICTIONS: No  FALLS:  Has patient fallen in last 6 months? No, some falls >6 months ago, mild FOF on uneven surfaces such as grass   LIVING ENVIRONMENT: Lives with: lives with their spouse Lives in: House/apartment handicap  Stairs: level entry in the back, no steps inside the home  Has following equipment at home: Dan Humphreys - 2 wheeled and Grab bars  OCCUPATION: retired   PLOF: Independent, Independent with basic ADLs, Independent with gait, and Independent with transfers  PATIENT GOALS: get rid of back pain and be more steady on my feet   NEXT MD VISIT: PRN with referring   OBJECTIVE: (objective measures completed at initial evaluation unless otherwise dated)   DIAGNOSTIC FINDINGS:  Lumbar spine grade 1 spondylolisthesis L4-5.  Lower lumbar facet changes.   No acute fractures or acute findings.  PATIENT SURVEYS:  FOTO 56, predicted 65 in 11 visits  SCREENING FOR RED FLAGS: Bowel or bladder incontinence: No Spinal tumors: No Cauda equina syndrome: No Compression fracture: No Abdominal aneurysm: No  COGNITION: Overall cognitive status: Within functional limits for tasks assessed     SENSATION: Not tested  MUSCLE LENGTH: Hamstrings and piriformis WNL B Hip flexors tight observation, not formally tested   POSTURE: rounded shoulders, forward head, increased lumbar lordosis, increased thoracic kyphosis, and flexed trunk   PALPATION:  No tenderness or mm spasms noted L spine in sitting   LUMBAR ROM:   AROM eval  Flexion WNL but very cautious movement, RFIS no change  Extension Hypermobile, deferred REIS due to hx of spondylolisthesis   Right lateral  flexion WNL   Left lateral flexion WNL   Right rotation   Left rotation    (Blank rows = not tested)    LOWER EXTREMITY MMT:    MMT Right eval Left eval  Hip flexion 4 4-  Hip extension    Hip abduction 3 3  Hip adduction    Hip internal rotation    Hip external rotation    Knee flexion 4 4+  Knee extension 4- pain 3 pain  Ankle dorsiflexion 4 4+  Ankle plantarflexion    Ankle inversion    Ankle eversion     (Blank rows = not tested)  LUMBAR SPECIAL TESTS:  Straight leg raise test: Negative  FUNCTIONAL TESTS:  Berg Balance Scale: 39/56    TODAY'S TREATMENT:                                                                                                                              [facility] Adult PT Treatment:                                                DATE: 06/21/22 Therapeutic Exercise: 6# suitcase carry 3x84ft cues for pacing Heel raises 2x12 // bars cues for pacing Fwd/retro walking 3 laps in // bars no UE support, CGA for retro, cues for posture and step length Seated RTB paloff press 2x8 each direction cues for posture and pacing Seated pelvic tilts 2x15 cues for reduced compensations at T spine, breath control  HEP handout/update + education   [facility] Adult PT Treatment:                                                DATE: 06/15/22 Therapeutic Exercise: Bridge 2x12 cues for breath control Posterior pelvic tilt 2x15 cues for sequencing Seated adductor isos 2x15 cues for form and posture STS from lowest mat  x5, raised mat x5 cues for BOS and pacing Suitcase carry 5# 4x53ft BIL cues for pacing Heel raises // bars 2x12 cues for reduced compensations  RTB hip ext in // bars x8 BIL cues for reduced trunk lean HEP update + education    [facility] Adult PT Treatment:                                                DATE: 06/04/22 Therapeutic Exercise: LAQ red band x10 BIL cues for setup Hooklying posterior pelvic tilt 2x10 cues for breath control and mechanics Bridge 2x10  cues for appropriate ROM and breath control Hooklying adduction iso x10 cues for breath control and pacing Adduction iso sitting unsupported x10 cues for breath control and posture Seated hip abduction iso red band 2x10 cues for posture and setup 5# suitcase carry 3x2ft BIL cues for posture and pacing  HEP update + handout/education, discussion/education on strategies to maximize adherence     PATIENT EDUCATION:  Education details:  rationale for interventions, HEP  Person educated: Patient Education method: Explanation, Demonstration, and Handouts Education comprehension: verbalized understanding, returned demonstration, and needs further education  HOME EXERCISE PROGRAM: Access Code: [email or web address] URL: [email or web address] Date: 06/21/2022 Prepared by: Fransisco Hertz  Exercises - Supine Posterior Pelvic Tilt  - 2-3 x daily - 7 x weekly - 1 sets -  15 reps - 3 hold - Seated Hip Abduction with Resistance  - 2-3 x daily - 7 x weekly - 1 sets - 10 reps - Seated Hip Adduction Isometrics with Ball  - 2-3 x daily - 7 x weekly - 1 sets - 15 reps - Heel Raises with Counter Support  - 2-3 x daily - 7 x weekly - 1 sets - 10 reps - Supine Bridge  - 2-3 x daily - 7 x weekly - 1 sets - 10 reps  ASSESSMENT:  CLINICAL IMPRESSION: 06/21/2022 Pt arrives w/ 4.5/10 low back pain, no issues after last session. Today moving exercises into more WB and upright positions to work on postural endurance which pt tolerates well, does require some rest breaks due to exertional fatigue but no increases in pain. Pt notes paloff presses do seem to affect her concordant R sided area although this is nonpainful, describes more as fatigue. Pt reports improvement in pain as session goes on, departs with 3/10 pain and no adverse events, HEP update as above. Recommend continuing along current POC in order to address relevant deficits and improve functional tolerance. Pt departs today's session in no acute  distress, all voiced questions/concerns addressed appropriately from PT perspective.    Per eval - Patient is a 77 y.o. F who was seen today for physical therapy evaluation and treatment for low back pain. Exam reveals significant functional muscle weakness (primarily in legs and core), postural impairments, and significant balance impairments. Does have a hx of some vertigo, we can plan to address in PT with a new referral once her back and balance issues are taken care of. Will benefit from skilled PT services to address all impairments, reduce fall risk, and assist in return to optimal level of function.   OBJECTIVE IMPAIRMENTS: decreased balance, decreased mobility, difficulty walking, decreased strength, postural dysfunction, obesity, and pain.   ACTIVITY LIMITATIONS: standing, squatting, stairs, transfers, and locomotion level  PARTICIPATION LIMITATIONS: shopping, community activity, yard work, and church  PERSONAL FACTORS: Age, Behavior pattern, Fitness, Past/current experiences, Sex, and Time since onset of injury/illness/exacerbation are also affecting patient's functional outcome.   REHAB POTENTIAL: Good  CLINICAL DECISION MAKING: Stable/uncomplicated  EVALUATION COMPLEXITY: Low   GOALS: Goals reviewed with patient? Yes  SHORT TERM GOALS: Target date: 06/22/2022    Will be complaint with appropriate progressive HEP  Baseline: 06/21/22: good HEP adherence reported  Goal status: MET   2.  Pain to be no more than 6/10 at worst  Baseline:  06/21/22: 4.5/10 pain today, variable w/ activity Goal status: ONGOING   3.  Will demonstrate improved biomechanics and posture with all functional tasks  Baseline:  06/21/22: improved mechanics w/ transfers and gait  Goal status: MET    LONG TERM GOALS: Target date: 07/20/2022    MMT to improve by at least 1 grade in all weak groups  Baseline:  Goal status: INITIAL  2.  Will score at least 48 on Berg to show reduced overall fall  risk  Baseline:  Goal status: INITIAL  3.  Pain to be no more than 4/10 at worst  Baseline:  Goal status: INITIAL  4.  Will demonstrate ability to ambulate over grass/gravel no device with minimal fall risk  Baseline:  Goal status: INITIAL  5.  Will be able to complete all house keeping tasks and daily activities without significant increase in back pain from resting level  Baseline:  Goal status: INITIAL  6.  FOTO score to be within 5  points of goal level by DC  Baseline:  Goal status: INITIAL  PLAN:  PT FREQUENCY: 1x/week  PT DURATION: 8 weeks  PLANNED INTERVENTIONS: Therapeutic exercises, Therapeutic activity, Neuromuscular re-education, Balance training, Gait training, Patient/Family education, Self Care, Joint mobilization, Stair training, Aquatic Therapy, Dry Needling, Electrical stimulation, Cryotherapy, Moist heat, Taping, Ionotophoresis 4mg /ml Dexamethasone, Manual therapy, and Re-evaluation.  PLAN FOR NEXT SESSION: continue to work on postural endurance, core strength and functional mobility. Pt seems to find paloff presses helpful today, assess response next session and modify accordingly. HEP review/update PRN.     Ashley Murrain PT, DPT 06/21/2022 12:44 PM

## 2022-06-21 ENCOUNTER — Encounter: Payer: Self-pay | Admitting: Physical Therapy

## 2022-06-21 ENCOUNTER — Ambulatory Visit: Payer: Medicare Other | Admitting: Physical Therapy

## 2022-06-21 DIAGNOSIS — M6281 Muscle weakness (generalized): Secondary | ICD-10-CM

## 2022-06-21 DIAGNOSIS — R2681 Unsteadiness on feet: Secondary | ICD-10-CM

## 2022-06-21 DIAGNOSIS — G8929 Other chronic pain: Secondary | ICD-10-CM | POA: Diagnosis not present

## 2022-06-21 DIAGNOSIS — R262 Difficulty in walking, not elsewhere classified: Secondary | ICD-10-CM

## 2022-06-21 DIAGNOSIS — M545 Low back pain, unspecified: Secondary | ICD-10-CM

## 2022-06-25 ENCOUNTER — Other Ambulatory Visit: Payer: Self-pay | Admitting: Orthopaedic Surgery

## 2022-06-28 ENCOUNTER — Ambulatory Visit: Payer: Medicare Other | Admitting: Physical Therapy

## 2022-06-28 ENCOUNTER — Encounter: Payer: Self-pay | Admitting: Physical Therapy

## 2022-06-28 DIAGNOSIS — R262 Difficulty in walking, not elsewhere classified: Secondary | ICD-10-CM

## 2022-06-28 DIAGNOSIS — M6281 Muscle weakness (generalized): Secondary | ICD-10-CM | POA: Diagnosis not present

## 2022-06-28 DIAGNOSIS — M545 Low back pain, unspecified: Secondary | ICD-10-CM

## 2022-06-28 DIAGNOSIS — G8929 Other chronic pain: Secondary | ICD-10-CM

## 2022-06-28 DIAGNOSIS — R2681 Unsteadiness on feet: Secondary | ICD-10-CM

## 2022-06-28 NOTE — Therapy (Signed)
OUTPATIENT PHYSICAL THERAPY TREATMENT NOTE   Patient Name: Crystal Jackson MRN: [id number] DOB:05/11/1945, 77 y.o., female Today's Date: 06/28/2022  END OF SESSION:  PT End of Session - 06/28/22 1206     Visit Number 5    Number of Visits 9    Date for PT Re-Evaluation 07/20/22    Authorization Type [employer]    Authorization Time Period 05/25/22 to 07/20/22    Progress Note Due on Visit 10    PT Start Time 1148    PT Stop Time 1229    PT Time Calculation (min) 41 min    Activity Tolerance Patient tolerated treatment well;No increased pain    Behavior During Therapy WFL for tasks assessed/performed                 Past Medical History:  Diagnosis Date   Anxiety    Arthritis    Asthma    Back pain    Bilateral swelling of feet    Constipation    Depression    Gallbladder problem    GERD (gastroesophageal reflux disease)    History of low potassium    Hypertension    Joint pain    Obesity    Palpitations    Shortness of breath    Sleep apnea    Past Surgical History:  Procedure Laterality Date   ABDOMINAL HYSTERECTOMY     APPENDECTOMY     CHOLECYSTECTOMY     KNEE SURGERY Left    SHOULDER SURGERY Right    Patient Active Problem List   Diagnosis Date Noted   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   BMI 40.0-44.9, adult (HCC)-current bmi 37.3 03/20/2022   Morbid obesity (HCC) 01/30/2022   BMI 37.0-37.9, adult 01/30/2022   B12 deficiency due to diet 09/27/2021   SOB (shortness of breath) on exertion 09/27/2021   Chronic vertigo 09/27/2021   Other hyperlipidemia 08/28/2021   Vitamin D deficiency 08/28/2021   Pre-diabetes 06/28/2021   Sepsis (HCC) 05/15/2021   Hypoxia 05/15/2021   Essential hypertension 05/15/2021   DM (diabetes mellitus), type 2 (HCC) 05/15/2021   Hypomagnesemia 05/15/2021   UTI (urinary tract infection) 05/14/2021   Acute exacerbation of chronic low back pain 03/10/2021   OSA on CPAP 09/03/2018   Mild persistent asthma without  complication 09/03/2018   Class 3 severe obesity with serious comorbidity and body mass index (BMI) of 40.0 to 44.9 in adult [facility]) 04/14/2018   Unilateral primary osteoarthritis, left knee 04/14/2018   Asthma in adult, mild intermittent, uncomplicated 02/26/2017   Allergic rhinitis 09/05/2013    PCP: Dorinda Hill MD   REFERRING PROVIDER: Dorinda Hill MD   REFERRING DIAG: M54.50,G89.29 (ICD-10-CM) - Chronic right-sided low back pain without sciatica M47.816 (ICD-10-CM) - Spondylosis without myelopathy or radiculopathy, lumbar region M47.816 (ICD-10-CM) - Facet arthropathy, lumbar  Rationale for Evaluation and Treatment: Rehabilitation  THERAPY DIAG:  Chronic bilateral low back pain without sciatica  Difficulty in walking, not elsewhere classified  Muscle weakness (generalized)  Unsteadiness on feet  ONSET DATE: 6-8 weeks ago   SUBJECTIVE:    Feeling OK this morning, there was one exercise that I was doing last session that seemed to increase some pulling and pain in my low back. So far I'm not sure that anything we've tried is really helping the back pain. Pain is not really changing but I feel more steady on my feet.  Per eval - Its hard to pinpoint anything I did, but the back pain just "occurred". Sometimes its not as bad as others, other times its very difficult to walk or get up and down. No radicular sx or numbness/tingling from the back. No new B/B incontinence, no sudden weight loss, no major sleep issues due to pain. Have tried some old exercises "moving this way and that" and it really made my pain worse. I've been wearing a back brace from [place] around the house.   SUBJECTIVE STATEMENT: 06/28/2022 Pt states her knees are feeling about the same as usual. About 4.5/10 pain at present, states  her back has been a bit more uncomfortable this week which she attributes to more gardening. No increase in pain or soreness after last session. HEP going well, no issues.     PERTINENT HISTORY:  Anxiety, OA, HTN, obesity, SOB, knee surgery, shoulder surgery   PAIN:  Are you having pain? Yes: NPRS scale: 3-4/10 Pain location: middle of back going to R side (lumbar) Pain description: discomfort  Aggravating factors: lifting a lot, pulling, more than normal physical activity  Relieving factors: sitting down, heat   At worst can get to 7-9/10  PRECAUTIONS: ICD/Pacemaker  WEIGHT BEARING RESTRICTIONS: No  FALLS:  Has patient fallen in last 6 months? No, some falls >6 months ago, mild FOF on uneven surfaces such as grass   LIVING ENVIRONMENT: Lives with: lives with their spouse Lives in: House/apartment handicap  Stairs: level entry in the back, no steps inside the home  Has following equipment at home: Dan Humphreys - 2 wheeled and Grab bars  OCCUPATION: retired   PLOF: Independent, Independent with basic ADLs, Independent with gait, and Independent with transfers  PATIENT GOALS: get rid of back pain and be more steady on my feet   NEXT MD VISIT: PRN with referring   OBJECTIVE: (objective measures completed at initial evaluation unless otherwise dated)   DIAGNOSTIC FINDINGS:  Lumbar spine grade 1 spondylolisthesis L4-5.  Lower lumbar facet changes.   No acute fractures or acute findings.  PATIENT SURVEYS:  FOTO 56, predicted 65 in 11 visits  SCREENING FOR RED FLAGS: Bowel or bladder incontinence: No Spinal tumors: No Cauda equina syndrome: No Compression fracture: No Abdominal aneurysm: No  COGNITION: Overall cognitive status: Within functional limits for tasks assessed     SENSATION: Not tested  MUSCLE LENGTH: Hamstrings and piriformis WNL B Hip flexors tight observation, not formally tested   POSTURE: rounded shoulders, forward head, increased lumbar lordosis,  increased thoracic kyphosis, and flexed trunk   PALPATION:  No tenderness or mm spasms noted L spine in sitting   LUMBAR ROM:   AROM eval  Flexion WNL but very cautious movement, RFIS no change  Extension Hypermobile, deferred REIS due to hx of spondylolisthesis   Right lateral flexion WNL   Left lateral flexion WNL   Right rotation   Left rotation    (Blank rows = not tested)    LOWER EXTREMITY MMT:    MMT Right eval Left eval  Hip flexion 4 4-  Hip extension    Hip abduction 3 3  Hip adduction    Hip internal rotation    Hip external rotation    Knee flexion 4 4+  Knee extension 4- pain 3 pain  Ankle dorsiflexion 4 4+  Ankle plantarflexion    Ankle inversion    Ankle eversion     (Blank rows = not tested)  LUMBAR SPECIAL TESTS:  Straight leg  raise test: Negative  FUNCTIONAL TESTS:  Berg Balance Scale: 39/56    TODAY'S TREATMENT:                                                                                                                              [facility] Adult PT Treatment:                                                DATE:    06/28/22  TherEx  Bridges 2 x10 PPT 2x10 with 3 second holds  Seated marches x10 B + TA set  Lateral trunk crunches x10 B STS with red TB above knees 21" mat table no UEs x7 Standing marches + TA set red TB above knees x5 B Standing hip ABD x5 with red TB above knees   NMR  Tandem stance solid surface 3x30 seconds B Narrow BOS 3x30 seconds ( 2 with EO, 1 with EC) solid surface Standing marches solid surface alternating taps on 6 inch step x10   06/21/22 Therapeutic Exercise: 6# suitcase carry 3x37ft cues for pacing Heel raises 2x12 // bars cues for pacing Fwd/retro walking 3 laps in // bars no UE support, CGA for retro, cues for posture and step length Seated RTB paloff press 2x8 each direction cues for posture and pacing Seated pelvic tilts 2x15 cues for reduced compensations at T spine, breath control  HEP  handout/update + education   [facility] Adult PT Treatment:                                                DATE: 06/15/22 Therapeutic Exercise: Bridge 2x12 cues for breath control Posterior pelvic tilt 2x15 cues for sequencing Seated adductor isos 2x15 cues for form and posture STS from lowest mat  x5, raised mat x5 cues for BOS and pacing Suitcase carry 5# 4x59ft BIL cues for pacing Heel raises // bars 2x12 cues for reduced compensations  RTB hip ext in // bars x8 BIL cues for reduced trunk lean HEP update + education    [facility] Adult PT Treatment:                                                DATE: 06/04/22 Therapeutic Exercise: LAQ red band x10 BIL cues for setup Hooklying posterior pelvic tilt 2x10 cues for breath control and mechanics Bridge 2x10 cues for appropriate ROM and breath control Hooklying adduction iso x10 cues for breath control and pacing Adduction iso sitting unsupported x10 cues for breath control and posture Seated hip abduction iso red  band 2x10 cues for posture and setup 5# suitcase carry 3x25ft BIL cues for posture and pacing  HEP update + handout/education, discussion/education on strategies to maximize adherence     PATIENT EDUCATION:  Education details:  rationale for interventions, HEP  Person educated: Patient Education method: Explanation, Demonstration, and Handouts Education comprehension: verbalized understanding, returned demonstration, and needs further education  HOME EXERCISE PROGRAM: Access Code: [email or web address] URL: [email or web address] Date: 06/21/2022 Prepared by: Fransisco Hertz  Exercises - Supine Posterior Pelvic Tilt  - 2-3 x daily - 7 x weekly - 1 sets - 15 reps - 3 hold - Seated Hip Abduction with Resistance  - 2-3 x daily - 7 x weekly - 1 sets - 10 reps - Seated Hip Adduction Isometrics with Ball  - 2-3 x daily - 7 x weekly - 1 sets - 15 reps - Heel Raises with Counter Support  - 2-3 x daily - 7 x weekly - 1 sets - 10 reps -  Supine Bridge  - 2-3 x daily - 7 x weekly - 1 sets - 10 reps  ASSESSMENT:  CLINICAL IMPRESSION:  Kriste Basque arrives today doing OK, noticing some functional improvement in terms of walking and balance but no improvement in pain. Did have some increased symptoms after Pavloff press last time, we avoided this today but still worked on core strength/LE strength in general and balance. We will plan on continuing through planned sessions and then obtain objective measures to further clarify POC/wishes to continue with PT at that point.   Per eval - Patient is a 77 y.o. F who was seen today for physical therapy evaluation and treatment for low back pain. Exam reveals significant functional muscle weakness (primarily in legs and core), postural impairments, and significant balance impairments. Does have a hx of some vertigo, we can plan to address in PT with a new referral once her back and balance issues are taken care of. Will benefit from skilled PT services to address all impairments, reduce fall risk, and assist in return to optimal level of function.   OBJECTIVE IMPAIRMENTS: decreased balance, decreased mobility, difficulty walking, decreased strength, postural dysfunction, obesity, and pain.   ACTIVITY LIMITATIONS: standing, squatting, stairs, transfers, and locomotion level  PARTICIPATION LIMITATIONS: shopping, community activity, yard work, and church  PERSONAL FACTORS: Age, Behavior pattern, Fitness, Past/current experiences, Sex, and Time since onset of injury/illness/exacerbation are also affecting patient's functional outcome.   REHAB POTENTIAL: Good  CLINICAL DECISION MAKING: Stable/uncomplicated  EVALUATION COMPLEXITY: Low   GOALS: Goals reviewed with patient? Yes  SHORT TERM GOALS: Target date: 06/22/2022    Will be complaint with appropriate progressive HEP  Baseline: 06/21/22: good HEP adherence reported  Goal status: MET   2.  Pain to be no more than 6/10 at worst  Baseline:   06/21/22: 4.5/10 pain today, variable w/ activity Goal status: ONGOING   3.  Will demonstrate improved biomechanics and posture with all functional tasks  Baseline:  06/21/22: improved mechanics w/ transfers and gait  Goal status: MET    LONG TERM GOALS: Target date: 07/20/2022    MMT to improve by at least 1 grade in all weak groups  Baseline:  Goal status: INITIAL  2.  Will score at least 48 on Berg to show reduced overall fall risk  Baseline:  Goal status: INITIAL  3.  Pain to be no more than 4/10 at worst  Baseline:  Goal status: INITIAL  4.  Will demonstrate ability to ambulate over grass/gravel no device with  minimal fall risk  Baseline:  Goal status: INITIAL  5.  Will be able to complete all house keeping tasks and daily activities without significant increase in back pain from resting level  Baseline:  Goal status: INITIAL  6.  FOTO score to be within 5 points of goal level by DC  Baseline:  Goal status: INITIAL  PLAN:  PT FREQUENCY: 1x/week  PT DURATION: 8 weeks  PLANNED INTERVENTIONS: Therapeutic exercises, Therapeutic activity, Neuromuscular re-education, Balance training, Gait training, Patient/Family education, Self Care, Joint mobilization, Stair training, Aquatic Therapy, Dry Needling, Electrical stimulation, Cryotherapy, Moist heat, Taping, Ionotophoresis 4mg /ml Dexamethasone, Manual therapy, and Re-evaluation.  PLAN FOR NEXT SESSION: continue to work on postural endurance, core strength and functional mobility.  HEP review/update PRN.    Nedra Hai, PT, DPT 06/28/22 12:32 PM

## 2022-07-04 ENCOUNTER — Ambulatory Visit: Payer: Medicare Other | Admitting: Rehabilitative and Restorative Service Providers"

## 2022-07-04 ENCOUNTER — Encounter: Payer: Self-pay | Admitting: Rehabilitative and Restorative Service Providers"

## 2022-07-04 DIAGNOSIS — R262 Difficulty in walking, not elsewhere classified: Secondary | ICD-10-CM

## 2022-07-04 DIAGNOSIS — M6281 Muscle weakness (generalized): Secondary | ICD-10-CM

## 2022-07-04 DIAGNOSIS — G8929 Other chronic pain: Secondary | ICD-10-CM | POA: Diagnosis not present

## 2022-07-04 DIAGNOSIS — R2681 Unsteadiness on feet: Secondary | ICD-10-CM

## 2022-07-04 DIAGNOSIS — M545 Low back pain, unspecified: Secondary | ICD-10-CM

## 2022-07-04 NOTE — Therapy (Signed)
OUTPATIENT PHYSICAL THERAPY TREATMENT NOTE   Patient Name: Crystal Jackson MRN: [id number] DOB:01/16/1945, 77 y.o., female Today's Date: 07/04/2022  END OF SESSION:  PT End of Session - 07/04/22 1158     Visit Number 6    Number of Visits 9    Date for PT Re-Evaluation 07/20/22    Authorization Type [employer]    Authorization Time Period 05/25/22 to 07/20/22    Progress Note Due on Visit 10    PT Start Time 1015    PT Stop Time 1100    PT Time Calculation (min) 45 min    Activity Tolerance Patient tolerated treatment well;No increased pain    Behavior During Therapy WFL for tasks assessed/performed              Past Medical History:  Diagnosis Date   Anxiety    Arthritis    Asthma    Back pain    Bilateral swelling of feet    Constipation    Depression    Gallbladder problem    GERD (gastroesophageal reflux disease)    History of low potassium    Hypertension    Joint pain    Obesity    Palpitations    Shortness of breath    Sleep apnea    Past Surgical History:  Procedure Laterality Date   ABDOMINAL HYSTERECTOMY     APPENDECTOMY     CHOLECYSTECTOMY     KNEE SURGERY Left    SHOULDER SURGERY Right    Patient Active Problem List   Diagnosis Date Noted   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   BMI 40.0-44.9, adult (HCC)-current bmi 37.3 03/20/2022   Morbid obesity (HCC) 01/30/2022   BMI 37.0-37.9, adult 01/30/2022   B12 deficiency due to diet 09/27/2021   SOB (shortness of breath) on exertion 09/27/2021   Chronic vertigo 09/27/2021   Other hyperlipidemia 08/28/2021   Vitamin D deficiency 08/28/2021   Pre-diabetes 06/28/2021   Sepsis (HCC) 05/15/2021   Hypoxia 05/15/2021   Essential hypertension 05/15/2021   DM (diabetes mellitus), type 2 (HCC) 05/15/2021   Hypomagnesemia 05/15/2021   UTI (urinary tract infection) 05/14/2021   Acute exacerbation of chronic low back pain 03/10/2021   OSA on CPAP 09/03/2018   Mild persistent asthma without  complication 09/03/2018   Class 3 severe obesity with serious comorbidity and body mass index (BMI) of 40.0 to 44.9 in adult (HCC) 04/14/2018   Unilateral primary osteoarthritis, left knee 04/14/2018   Asthma in adult, mild intermittent, uncomplicated 02/26/2017   Allergic rhinitis 09/05/2013    PCP: Dorinda Hill MD   REFERRING PROVIDER: Dorinda Hill MD   REFERRING DIAG: M54.50,G89.29 (ICD-10-CM) - Chronic right-sided low back pain without sciatica M47.816 (ICD-10-CM) - Spondylosis without myelopathy or radiculopathy, lumbar region M47.816 (ICD-10-CM) - Facet arthropathy, lumbar  Rationale for Evaluation and Treatment: Rehabilitation  THERAPY DIAG:  Chronic bilateral low back pain without sciatica  Difficulty in walking, not elsewhere classified  Muscle weakness (generalized)  Unsteadiness on feet  ONSET DATE: 6-8 weeks ago   SUBJECTIVE:  Per eval - Its hard to pinpoint anything I did, but the back pain just "occurred". Sometimes its not as bad as others, other times its very difficult to walk or get up and down. No radicular sx or numbness/tingling from the back. No new B/B incontinence, no sudden weight loss, no major sleep issues due to pain. Have tried some old exercises "moving this way and that" and it really made my pain worse. I've been wearing a back brace from [employer] around the house.   SUBJECTIVE STATEMENT: 07/04/2022 Crystal Jackson notes that she is limited in her standing and walking, particularly walking endurance.  She notes she can sit for longer periods of time before needing to change position due to low back pain.  She reports compliance with her home exercises.     PERTINENT HISTORY:  Anxiety, OA, HTN, obesity, SOB, knee surgery, shoulder surgery   PAIN:  Are you having pain? Yes: NPRS  scale: 3-4/10 Pain location: low back, mostly R side (lumbar) Pain description: discomfort  Aggravating factors: lifting a lot, pulling, more than normal physical activity, prolonged standing and walking  Relieving factors: sitting down, heat   At worst can get to 7-9/10  PRECAUTIONS: ICD/Pacemaker  WEIGHT BEARING RESTRICTIONS: No  FALLS:  Has patient fallen in last 6 months? No, some falls >6 months ago, mild FOF on uneven surfaces such as grass   LIVING ENVIRONMENT: Lives with: lives with their spouse Lives in: House/apartment handicap  Stairs: level entry in the back, no steps inside the home  Has following equipment at home: Dan Humphreys - 2 wheeled and Grab bars  OCCUPATION: retired   PLOF: Independent, Independent with basic ADLs, Independent with gait, and Independent with transfers  PATIENT GOALS: get rid of back pain and be more steady on my feet   NEXT MD VISIT: PRN with referring   OBJECTIVE: (objective measures completed at initial evaluation unless otherwise dated)   DIAGNOSTIC FINDINGS:  Lumbar spine grade 1 spondylolisthesis L4-5.  Lower lumbar facet changes.   No acute fractures or acute findings.  PATIENT SURVEYS:  FOTO 56, predicted 65 in 11 visits  SCREENING FOR RED FLAGS: Bowel or bladder incontinence: No Spinal tumors: No Cauda equina syndrome: No Compression fracture: No Abdominal aneurysm: No  COGNITION: Overall cognitive status: Within functional limits for tasks assessed     SENSATION: Not tested  MUSCLE LENGTH: Hamstrings and piriformis WNL B Hip flexors tight observation, not formally tested   POSTURE: rounded shoulders, forward head, increased lumbar lordosis, increased thoracic kyphosis, and flexed trunk   PALPATION:  No tenderness or mm spasms noted L spine in sitting   LUMBAR ROM:   AROM eval  Flexion WNL but very cautious movement, RFIS no change  Extension Hypermobile, deferred REIS due to hx of spondylolisthesis   Right  lateral flexion WNL   Left lateral flexion WNL   Right rotation   Left rotation    (Blank rows = not tested)    LOWER EXTREMITY MMT:    MMT Right eval Left eval  Hip flexion 4 4-  Hip extension    Hip abduction 3 3  Hip adduction    Hip internal rotation    Hip external rotation    Knee flexion 4 4+  Knee extension 4- pain 3 pain  Ankle dorsiflexion 4 4+  Ankle plantarflexion    Ankle inversion    Ankle eversion     (Blank rows = not tested)  LUMBAR SPECIAL TESTS:  Straight leg raise test:  Negative  FUNCTIONAL TESTS:  Berg Balance Scale: 39/56    TODAY'S TREATMENT:                                                                                                                              [facility] Adult PT Treatment:                                                DATE:  07/04/2022 Lumbar extension AROM 10 x 3seconds Shoulder blade pinches 10 x 5 seconds Yoga Bridge 4 sets of 5 for 3 seconds Heel to toe raises with transversus abdominus contraction 10 x 3 seconds Alternating hip hike 2 sets of 10 for 3 seconds  Functional Activities: Reviewed imaging with spine model so Kriste Basque would have a better understanding of what is going on with her spine anatomically.  Reviewed practical golfers left, logroll and discussed proper lumbar roll use when sitting.  Discussed the importance of changing positions frequently, maintaining good position and avoiding slouched, bent postures.  Discussed walking for exercises either with a shopping cart to improve endurance work doing short walks at home and stopping to do standing exercises to avoid symptom exacerbation and build endurance.   06/28/22  TherEx  Bridges 2 x10 PPT 2x10 with 3 second holds  Seated marches x10 B + TA set  Lateral trunk crunches x10 B STS with red TB above knees 21" mat table no UEs x7 Standing marches + TA set red TB above knees x5 B Standing hip ABD x5 with red TB above knees   NMR  Tandem stance solid  surface 3x30 seconds B Narrow BOS 3x30 seconds ( 2 with EO, 1 with EC) solid surface Standing marches solid surface alternating taps on 6 inch step x10   06/21/22 Therapeutic Exercise: 6# suitcase carry 3x55ft cues for pacing Heel raises 2x12 // bars cues for pacing Fwd/retro walking 3 laps in // bars no UE support, CGA for retro, cues for posture and step length Seated RTB paloff press 2x8 each direction cues for posture and pacing Seated pelvic tilts 2x15 cues for reduced compensations at T spine, breath control  HEP handout/update + education   PATIENT EDUCATION:  Education details:  rationale for interventions, HEP  Person educated: Patient Education method: Explanation, Demonstration, and Handouts Education comprehension: verbalized understanding, returned demonstration, and needs further education  HOME EXERCISE PROGRAM: Access Code: [email or web address] URL: [email or web address] Date: 06/21/2022 Prepared by: Fransisco Hertz  Exercises - Supine Posterior Pelvic Tilt  - 2-3 x daily - 7 x weekly - 1 sets - 15 reps - 3 hold - Seated Hip Abduction with Resistance  - 2-3 x daily - 7 x weekly - 1 sets - 10 reps - Seated Hip Adduction Isometrics with Ball  - 2-3 x daily - 7 x weekly - 1  sets - 15 reps - Heel Raises with Counter Support  - 2-3 x daily - 7 x weekly - 1 sets - 10 reps - Supine Bridge  - 2-3 x daily - 7 x weekly - 1 sets - 10 reps  ASSESSMENT:  CLINICAL IMPRESSION:  Kriste Basque did a great job with her previously prescribed home exercises.  She is doing particularly well with her bridging.  I gave her some standing exercises to do to, time program with emphasis on postural correction, lumbar extension active range of motion to help with stiff lumbar facet joints and some progressions to her postural strengthening for hip abductors and quadratus lumborum.  We also spent quite a bit of time on postural and body mechanics activities so that Kriste Basque may be more functional around  the house and still avoid exacerbating her symptoms.  With improved low back strength, correct body mechanics and taking appropriate rest breaks to avoid overuse, Crystal Jackson's prognosis to improve remains good.    Per eval - Patient is a 77 y.o. F who was seen today for physical therapy evaluation and treatment for low back pain. Exam reveals significant functional muscle weakness (primarily in legs and core), postural impairments, and significant balance impairments. Does have a hx of some vertigo, we can plan to address in PT with a new referral once her back and balance issues are taken care of. Will benefit from skilled PT services to address all impairments, reduce fall risk, and assist in return to optimal level of function.   OBJECTIVE IMPAIRMENTS: decreased balance, decreased mobility, difficulty walking, decreased strength, postural dysfunction, obesity, and pain.   ACTIVITY LIMITATIONS: standing, squatting, stairs, transfers, and locomotion level  PARTICIPATION LIMITATIONS: shopping, community activity, yard work, and church  PERSONAL FACTORS: Age, Behavior pattern, Fitness, Past/current experiences, Sex, and Time since onset of injury/illness/exacerbation are also affecting patient's functional outcome.   REHAB POTENTIAL: Good  CLINICAL DECISION MAKING: Stable/uncomplicated  EVALUATION COMPLEXITY: Low   GOALS: Goals reviewed with patient? Yes  SHORT TERM GOALS: Target date: 06/22/2022    Will be complaint with appropriate progressive HEP  Baseline: 06/21/22: good HEP adherence reported  Goal status: MET 07/04/2022  2.  Pain to be no more than 6/10 at worst  Baseline:  06/21/22: 4.5/10 pain today, variable w/ activity Goal status: ON GOING 07/04/2022  3.  Will demonstrate improved biomechanics and posture with all functional tasks  Baseline:  06/21/22: improved mechanics w/ transfers and gait  Goal status: On Going 07/04/2022   LONG TERM GOALS: Target date: 07/20/2022    MMT  to improve by at least 1 grade in all weak groups  Baseline:  Goal status: INITIAL  2.  Will score at least 48 on Berg to show reduced overall fall risk  Baseline:  Goal status: INITIAL  3.  Pain to be no more than 4/10 at worst  Baseline:  Goal status: On Going 07/04/2022  4.  Will demonstrate ability to ambulate over grass/gravel no device with minimal fall risk  Baseline:  Goal status: INITIAL  5.  Will be able to complete all house keeping tasks and daily activities without significant increase in back pain from resting level  Baseline:  Goal status: On Going 07/04/2022  6.  FOTO score to be within 5 points of goal level by DC  Baseline:  Goal status: INITIAL  PLAN:  PT FREQUENCY: 1x/week  PT DURATION: 8 weeks  PLANNED INTERVENTIONS: Therapeutic exercises, Therapeutic activity, Neuromuscular re-education, Balance training, Gait training, Patient/Family education,  Self Care, Joint mobilization, Stair training, Aquatic Therapy, Dry Needling, Electrical stimulation, Cryotherapy, Moist heat, Taping, Ionotophoresis 4mg /ml Dexamethasone, Manual therapy, and Re-evaluation.  PLAN FOR NEXT SESSION: Continue to work on postural and lumbar strength and endurance.  Progress lumbar extensors, hip abductors quadratus lumbar arm strength as appropriate.  Review postural and body mechanics work so she may be more functional at home.  Cherlyn Cushing PT, MPT 07/04/22 12:07 PM

## 2022-07-10 ENCOUNTER — Telehealth: Payer: Self-pay | Admitting: Physical Medicine and Rehabilitation

## 2022-07-10 NOTE — Telephone Encounter (Signed)
See response to patient message. Please follow up with her Megan on setting up MBB's etc.

## 2022-07-12 ENCOUNTER — Other Ambulatory Visit (INDEPENDENT_AMBULATORY_CARE_PROVIDER_SITE_OTHER): Payer: Medicare Other

## 2022-07-12 ENCOUNTER — Encounter: Payer: Self-pay | Admitting: Physical Therapy

## 2022-07-12 ENCOUNTER — Ambulatory Visit: Payer: Medicare Other | Admitting: Physical Therapy

## 2022-07-12 ENCOUNTER — Ambulatory Visit (INDEPENDENT_AMBULATORY_CARE_PROVIDER_SITE_OTHER): Payer: Medicare Other | Admitting: Orthopaedic Surgery

## 2022-07-12 ENCOUNTER — Other Ambulatory Visit: Payer: Self-pay | Admitting: Physical Medicine and Rehabilitation

## 2022-07-12 VITALS — Ht 64.0 in | Wt 223.0 lb

## 2022-07-12 DIAGNOSIS — M1711 Unilateral primary osteoarthritis, right knee: Secondary | ICD-10-CM | POA: Diagnosis not present

## 2022-07-12 DIAGNOSIS — G8929 Other chronic pain: Secondary | ICD-10-CM

## 2022-07-12 DIAGNOSIS — M545 Low back pain, unspecified: Secondary | ICD-10-CM

## 2022-07-12 DIAGNOSIS — R262 Difficulty in walking, not elsewhere classified: Secondary | ICD-10-CM

## 2022-07-12 DIAGNOSIS — M6281 Muscle weakness (generalized): Secondary | ICD-10-CM | POA: Diagnosis not present

## 2022-07-12 DIAGNOSIS — R2681 Unsteadiness on feet: Secondary | ICD-10-CM | POA: Diagnosis not present

## 2022-07-12 DIAGNOSIS — M25562 Pain in left knee: Secondary | ICD-10-CM

## 2022-07-12 DIAGNOSIS — M1712 Unilateral primary osteoarthritis, left knee: Secondary | ICD-10-CM | POA: Diagnosis not present

## 2022-07-12 DIAGNOSIS — M25561 Pain in right knee: Secondary | ICD-10-CM

## 2022-07-12 DIAGNOSIS — M47816 Spondylosis without myelopathy or radiculopathy, lumbar region: Secondary | ICD-10-CM

## 2022-07-12 NOTE — Therapy (Signed)
OUTPATIENT PHYSICAL THERAPY TREATMENT NOTE/PROGRESS NOTE   Patient Name: Crystal Jackson MRN: [id number] DOB:1945-10-08, 77 y.o., female Today's Date: 07/12/2022   Progress Note Reporting Period 05/25/22 to 07/12/22  See note below for Objective Data and Assessment of Progress/Goals.      END OF SESSION:  PT End of Session - 07/12/22 1320     Visit Number 7    Number of Visits 15    Date for PT Re-Evaluation 09/06/22    Authorization Type [employer]    Authorization Time Period 05/25/22 to 07/20/22    Progress Note Due on Visit 17   done on visit 7 due to recert   PT Start Time 1300    PT Stop Time 1342    PT Time Calculation (min) 42 min    Activity Tolerance Patient tolerated treatment well;No increased pain    Behavior During Therapy WFL for tasks assessed/performed               Past Medical History:  Diagnosis Date   Anxiety    Arthritis    Asthma    Back pain    Bilateral swelling of feet    Constipation    Depression    Gallbladder problem    GERD (gastroesophageal reflux disease)    History of low potassium    Hypertension    Joint pain    Obesity    Palpitations    Shortness of breath    Sleep apnea    Past Surgical History:  Procedure Laterality Date   ABDOMINAL HYSTERECTOMY     APPENDECTOMY     CHOLECYSTECTOMY     KNEE SURGERY Left    SHOULDER SURGERY Right    Patient Active Problem List   Diagnosis Date Noted   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   BMI 40.0-44.9, adult (HCC)-current bmi 37.3 03/20/2022   Morbid obesity (HCC) 01/30/2022   BMI 37.0-37.9, adult 01/30/2022   B12 deficiency due to diet 09/27/2021   SOB (shortness of breath) on exertion 09/27/2021   Chronic vertigo 09/27/2021   Other hyperlipidemia 08/28/2021   Vitamin D deficiency 08/28/2021   Pre-diabetes 06/28/2021   Sepsis (HCC) 05/15/2021   Hypoxia 05/15/2021   Essential hypertension 05/15/2021   DM (diabetes mellitus), type 2 (HCC) 05/15/2021    Hypomagnesemia 05/15/2021   UTI (urinary tract infection) 05/14/2021   Acute exacerbation of chronic low back pain 03/10/2021   OSA on CPAP 09/03/2018   Mild persistent asthma without complication 09/03/2018   Class 3 severe obesity with serious comorbidity and body mass index (BMI) of 40.0 to 44.9 in adult [facility]) 04/14/2018   Unilateral primary osteoarthritis, left knee 04/14/2018   Asthma in adult, mild intermittent, uncomplicated 02/26/2017   Allergic rhinitis 09/05/2013    PCP: Dorinda Hill MD   REFERRING PROVIDER: Dorinda Hill MD   REFERRING DIAG: M54.50,G89.29 (ICD-10-CM) - Chronic right-sided low back pain without sciatica M47.816 (ICD-10-CM) - Spondylosis without myelopathy or radiculopathy, lumbar region M47.816 (ICD-10-CM) - Facet arthropathy, lumbar  Rationale for Evaluation and Treatment: Rehabilitation  THERAPY DIAG:  Chronic bilateral low back pain without sciatica  Difficulty in walking, not elsewhere classified  Muscle weakness (generalized)  Unsteadiness on feet  ONSET DATE: 6-8 weeks ago   SUBJECTIVE:     My knees have been bothering me, I'm going to go ahead and get that nerve ablation done on my back. Dr. Alvester Morin sent me a message that his office is going to work on this. There's not been a lot of  benefit from PT for my back pain but my legs feel stronger, but still having knee joint pain. I feel like my balance is better.                                                                                                                                                                                          Per eval - Its hard to pinpoint anything I did, but the back pain just "occurred". Sometimes its not as bad as others, other times its very difficult to walk or get up and down. No radicular sx or numbness/tingling from the back. No new B/B incontinence, no sudden weight loss, no major sleep issues due to pain. Have tried some old exercises "moving this way and  that" and it really made my pain worse. I've been wearing a back brace from [employer] around the house.   SUBJECTIVE STATEMENT: 07/12/2022 Crystal Jackson notes that she is limited in her standing and walking, particularly walking endurance.  She notes she can sit for longer periods of time before needing to change position due to low back pain.  She reports compliance with her home exercises.     PERTINENT HISTORY:  Anxiety, OA, HTN, obesity, SOB, knee surgery, shoulder surgery   PAIN:  Are you having pain? Yes: NPRS scale: 1 in the back, knees 5-6/10 Pain location: lumbar- general discomfort, knees aching but comes and goes Pain description: see above  Aggravating factors: lifting a lot, pulling, more than normal physical activity, prolonged standing and walking  Relieving factors: sitting down, heat   At worst can get to 5/10 (07/12/22)  PRECAUTIONS: ICD/Pacemaker  WEIGHT BEARING RESTRICTIONS: No  FALLS:  Has patient fallen in last 6 months? No, some falls >6 months ago, mild FOF on uneven surfaces such as grass   LIVING ENVIRONMENT: Lives with: lives with their spouse Lives in: House/apartment handicap  Stairs: level entry in the back, no steps inside the home  Has following equipment at home: Dan Humphreys - 2 wheeled and Grab bars  OCCUPATION: retired   PLOF: Independent, Independent with basic ADLs, Independent with gait, and Independent with transfers  PATIENT GOALS: get rid of back pain and be more steady on my feet   NEXT MD VISIT: PRN with referring   OBJECTIVE: (objective measures completed at initial evaluation unless otherwise dated)   DIAGNOSTIC FINDINGS:  Lumbar spine grade 1 spondylolisthesis L4-5.  Lower lumbar facet changes.   No acute fractures or acute findings.  PATIENT SURVEYS:  FOTO 56, predicted 65 in 11 visits; 07/12/22- 52   SCREENING FOR RED FLAGS: Bowel or bladder incontinence: No Spinal tumors: No Cauda  equina syndrome: No Compression fracture:  No Abdominal aneurysm: No  COGNITION: Overall cognitive status: Within functional limits for tasks assessed     SENSATION: Not tested  MUSCLE LENGTH: Hamstrings and piriformis WNL B Hip flexors tight observation, not formally tested   POSTURE: rounded shoulders, forward head, increased lumbar lordosis, increased thoracic kyphosis, and flexed trunk   PALPATION:  No tenderness or mm spasms noted L spine in sitting   LUMBAR ROM:   AROM eval 07/12/22  Flexion WNL but very cautious movement, RFIS no change WNL much less cautious   Extension Hypermobile, deferred REIS due to hx of spondylolisthesis    Right lateral flexion WNL    Left lateral flexion WNL    Right rotation    Left rotation     (Blank rows = not tested)    LOWER EXTREMITY MMT:    MMT Right eval Left eval Right 07/12/22 Left 07/12/22  Hip flexion 4 4- 4- 4-  Hip extension      Hip abduction 3 3 3 3   Hip adduction      Hip internal rotation      Hip external rotation      Knee flexion 4 4+ 5 5  Knee extension 4- pain 3 pain 4+ 4+  Ankle dorsiflexion 4 4+ 5 5  Ankle plantarflexion      Ankle inversion      Ankle eversion       (Blank rows = not tested)  LUMBAR SPECIAL TESTS:  Straight leg raise test: Negative  FUNCTIONAL TESTS:  Berg Balance Scale: 39/56; 07/12/22- 46/56    TODAY'S TREATMENT:                                                                                                                              [facility] Adult PT Treatment:                                                DATE:   07/11/22  Objective measures, goal review, education on progress and POC moving forward  TherEX  Bridges x15  TA set + march red TB around knees x15 B Standing hip ABD at counter + TA set x10 B    07/04/2022 Lumbar extension AROM 10 x 3seconds Shoulder blade pinches 10 x 5 seconds Yoga Bridge 4 sets of 5 for 3 seconds Heel to toe raises with transversus abdominus contraction 10 x 3  seconds Alternating hip hike 2 sets of 10 for 3 seconds  Functional Activities: Reviewed imaging with spine model so Kriste Basque would have a better understanding of what is going on with her spine anatomically.  Reviewed practical golfers left, logroll and discussed proper lumbar roll use when sitting.  Discussed the importance of changing positions frequently, maintaining good position and avoiding slouched, bent postures.  Discussed walking for exercises either with a shopping cart to improve endurance work doing short walks at home and stopping to do standing exercises to avoid symptom exacerbation and build endurance.   06/28/22  TherEx  Bridges 2 x10 PPT 2x10 with 3 second holds  Seated marches x10 B + TA set  Lateral trunk crunches x10 B STS with red TB above knees 21" mat table no UEs x7 Standing marches + TA set red TB above knees x5 B Standing hip ABD x5 with red TB above knees   NMR  Tandem stance solid surface 3x30 seconds B Narrow BOS 3x30 seconds ( 2 with EO, 1 with EC) solid surface Standing marches solid surface alternating taps on 6 inch step x10   06/21/22 Therapeutic Exercise: 6# suitcase carry 3x2ft cues for pacing Heel raises 2x12 // bars cues for pacing Fwd/retro walking 3 laps in // bars no UE support, CGA for retro, cues for posture and step length Seated RTB paloff press 2x8 each direction cues for posture and pacing Seated pelvic tilts 2x15 cues for reduced compensations at T spine, breath control  HEP handout/update + education   PATIENT EDUCATION:  Education details:  rationale for interventions, HEP  Person educated: Patient Education method: Explanation, Demonstration, and Handouts Education comprehension: verbalized understanding, returned demonstration, and needs further education  HOME EXERCISE PROGRAM: Access Code: [email or web address] URL: [email or web address] Date: 06/21/2022 Prepared by: Fransisco Hertz  Exercises - Supine Posterior  Pelvic Tilt  - 2-3 x daily - 7 x weekly - 1 sets - 15 reps - 3 hold - Seated Hip Abduction with Resistance  - 2-3 x daily - 7 x weekly - 1 sets - 10 reps - Seated Hip Adduction Isometrics with Ball  - 2-3 x daily - 7 x weekly - 1 sets - 15 reps - Heel Raises with Counter Support  - 2-3 x daily - 7 x weekly - 1 sets - 10 reps - Supine Bridge  - 2-3 x daily - 7 x weekly - 1 sets - 10 reps  ASSESSMENT:  CLINICAL IMPRESSION:  Kriste Basque arrives doing well today. Sounds like MD is planning for lumbar ablation at some point in the near future. Took objective measures for recert/progress note today with good progress noted with PT. She is in agreement to extend POC, continue to feel that she has great prognosis for continued functional improvements.     Per eval - Patient is a 77 y.o. F who was seen today for physical therapy evaluation and treatment for low back pain. Exam reveals significant functional muscle weakness (primarily in legs and core), postural impairments, and significant balance impairments. Does have a hx of some vertigo, we can plan to address in PT with a new referral once her back and balance issues are taken care of. Will benefit from skilled PT services to address all impairments, reduce fall risk, and assist in return to optimal level of function.   OBJECTIVE IMPAIRMENTS: decreased balance, decreased mobility, difficulty walking, decreased strength, postural dysfunction, obesity, and pain.   ACTIVITY LIMITATIONS: standing, squatting, stairs, transfers, and locomotion level  PARTICIPATION LIMITATIONS: shopping, community activity, yard work, and church  PERSONAL FACTORS: Age, Behavior pattern, Fitness, Past/current experiences, Sex, and Time since onset of injury/illness/exacerbation are also affecting patient's functional outcome.   REHAB POTENTIAL: Good  CLINICAL DECISION MAKING: Stable/uncomplicated  EVALUATION COMPLEXITY: Low   GOALS: Goals reviewed with patient?  Yes  SHORT TERM GOALS: Target date: 08/09/2022    Will be complaint with  appropriate progressive HEP  Baseline: 06/21/22: good HEP adherence reported  Goal status: MET 07/04/2022  2.  Pain to be no more than 6/10 at worst  Baseline:  06/21/22: 1/10 pain today, back does not exceed 6/10  Goal status: MET 07/04/2022  3.  Will demonstrate improved biomechanics and posture with all functional tasks  Baseline:  06/21/22: improved mechanics w/ transfers and gait  Goal status: On Going 07/11/2022   LONG TERM GOALS: Target date: 09/06/2022    MMT to improve by at least 1 grade in all weak groups  Baseline:  Goal status: IN PROGRESS 07/11/22  2.  Will score at least 48 on Berg to show reduced overall fall risk  Baseline:  Goal status: IN PROGRESS 07/11/22  3.  Pain to be no more than 4/10 at worst  Baseline:  Goal status: MET for back  not knees 07/11/2022  4.  Will demonstrate ability to ambulate over grass/gravel no device with minimal fall risk  Baseline:  Goal status: IN PROGRESS 07/11/22- subjectively improving   5.  Will be able to complete all house keeping tasks and daily activities without significant increase in back pain from resting level  Baseline:  Goal status: On Going 07/11/2022  6.  FOTO score to be within 5 points of goal level by DC  Baseline:  Goal status: IN PROGRESS 07/11/22  PLAN:  PT FREQUENCY: 1x/week  PT DURATION: 8 weeks  PLANNED INTERVENTIONS: Therapeutic exercises, Therapeutic activity, Neuromuscular re-education, Balance training, Gait training, Patient/Family education, Self Care, Joint mobilization, Stair training, Aquatic Therapy, Dry Needling, Electrical stimulation, Cryotherapy, Moist heat, Taping, Ionotophoresis 4mg /ml Dexamethasone, Manual therapy, and Re-evaluation.  PLAN FOR NEXT SESSION: Continue to work on postural and lumbar strength and endurance.  Progress lumbar extensors, hip abductors quadratus lumbar arm strength as appropriate.  Review  postural and body mechanics work so she may be more functional at home.  Nedra Hai, PT, DPT 07/12/22 1:42 PM

## 2022-07-12 NOTE — Progress Notes (Signed)
The patient is a very pleasant 77 year old female that we have seen in the past.  She has debilitating arthritis in both of her knees.  Both knees hurt equally bad.  She is interested in this point in knee replacement surgery.  She has tried and failed all forms of conservative treatment.  She is try to lose weight.  Her BMI is 38.  She is not a diabetic but she is on diabetic medications for being a prediabetic and to help with weight loss.  She has had multiple injections in her knees and is in physical therapy to strengthen her knees.  Examination both knees shows valgus malalignment of both knees.  Both knees have severe patellofemoral crepitation but good range of motion.  Both knees are weak but ligamentously stable.  X-rays of both knees show severe end-stage arthritis with valgus malalignment.  There is severe patellofemoral arthritis of both knees and significant lateral compartment narrowing of both knees with osteophytes in all 3 compartments.  We had a long and thorough discussion about knee replacement surgery.  She does wish to proceed with this with the right knee first.  We talked about what to expect from an intraoperative and postoperative standpoint and discussed the risk and benefits of surgery.  We will work on getting her scheduled soon for a right total knee arthroplasty per her request.  I agree with this as well.  All questions and concerns were answered and addressed.

## 2022-07-13 ENCOUNTER — Other Ambulatory Visit: Payer: Self-pay | Admitting: Physical Medicine and Rehabilitation

## 2022-07-13 MED ORDER — TRAMADOL HCL 50 MG PO TABS: 50.0000 mg | ORAL_TABLET | Freq: Three times a day (TID) | ORAL | 0 refills | Status: DC | PRN

## 2022-07-20 ENCOUNTER — Ambulatory Visit (INDEPENDENT_AMBULATORY_CARE_PROVIDER_SITE_OTHER): Payer: Medicare Other | Admitting: Physical Therapy

## 2022-07-20 ENCOUNTER — Encounter: Payer: Self-pay | Admitting: Physical Therapy

## 2022-07-20 DIAGNOSIS — M545 Low back pain, unspecified: Secondary | ICD-10-CM

## 2022-07-20 DIAGNOSIS — R2681 Unsteadiness on feet: Secondary | ICD-10-CM

## 2022-07-20 DIAGNOSIS — M6281 Muscle weakness (generalized): Secondary | ICD-10-CM

## 2022-07-20 DIAGNOSIS — G8929 Other chronic pain: Secondary | ICD-10-CM | POA: Diagnosis not present

## 2022-07-20 DIAGNOSIS — R262 Difficulty in walking, not elsewhere classified: Secondary | ICD-10-CM | POA: Diagnosis not present

## 2022-07-20 NOTE — Therapy (Signed)
OUTPATIENT PHYSICAL THERAPY TREATMENT NOTE   Patient Name: Peniel Biel MRN: [id number] DOB:01/10/1945, 77 y.o., female Today's Date: 07/20/2022       END OF SESSION:  PT End of Session - 07/20/22 1215     Visit Number 8    Number of Visits 15    Date for PT Re-Evaluation 09/06/22    Authorization Type [employer]    Authorization Time Period 05/25/22 to 07/20/22    Progress Note Due on Visit 17    PT Start Time 1148    PT Stop Time 1228    PT Time Calculation (min) 40 min    Activity Tolerance Patient tolerated treatment well;No increased pain    Behavior During Therapy WFL for tasks assessed/performed                Past Medical History:  Diagnosis Date   Anxiety    Arthritis    Asthma    Back pain    Bilateral swelling of feet    Constipation    Depression    Gallbladder problem    GERD (gastroesophageal reflux disease)    History of low potassium    Hypertension    Joint pain    Obesity    Palpitations    Shortness of breath    Sleep apnea    Past Surgical History:  Procedure Laterality Date   ABDOMINAL HYSTERECTOMY     APPENDECTOMY     CHOLECYSTECTOMY     KNEE SURGERY Left    SHOULDER SURGERY Right    Patient Active Problem List   Diagnosis Date Noted   Unilateral primary osteoarthritis, right knee 07/12/2022   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   BMI 40.0-44.9, adult (HCC)-current bmi 37.3 03/20/2022   Morbid obesity (HCC) 01/30/2022   BMI 37.0-37.9, adult 01/30/2022   B12 deficiency due to diet 09/27/2021   SOB (shortness of breath) on exertion 09/27/2021   Chronic vertigo 09/27/2021   Other hyperlipidemia 08/28/2021   Vitamin D deficiency 08/28/2021   Pre-diabetes 06/28/2021   Sepsis (HCC) 05/15/2021   Hypoxia 05/15/2021   Essential hypertension 05/15/2021   DM (diabetes mellitus), type 2 (HCC) 05/15/2021   Hypomagnesemia 05/15/2021   UTI (urinary tract infection) 05/14/2021   Acute exacerbation of chronic low back pain  03/10/2021   OSA on CPAP 09/03/2018   Mild persistent asthma without complication 09/03/2018   Class 3 severe obesity with serious comorbidity and body mass index (BMI) of 40.0 to 44.9 in adult (HCC) 04/14/2018   Unilateral primary osteoarthritis, left knee 04/14/2018   Asthma in adult, mild intermittent, uncomplicated 02/26/2017   Allergic rhinitis 09/05/2013    PCP: Dorinda Hill MD   REFERRING PROVIDER: Dorinda Hill MD   REFERRING DIAG: M54.50,G89.29 (ICD-10-CM) - Chronic right-sided low back pain without sciatica M47.816 (ICD-10-CM) - Spondylosis without myelopathy or radiculopathy, lumbar region M47.816 (ICD-10-CM) - Facet arthropathy, lumbar  Rationale for Evaluation and Treatment: Rehabilitation  THERAPY DIAG:  Chronic bilateral low back pain without sciatica  Difficulty in walking, not elsewhere classified  Muscle weakness (generalized)  Unsteadiness on feet  ONSET DATE: 6-8 weeks ago   SUBJECTIVE:    My knees have been giving me a hard time, the doctor and I decided that it might be time for a replacement. On a wait list, they told me about 7 weeks due to staff shortage with anesthesia people. Back and shoulder have been bothering me too. No falls. I've not done a lot this week in terms of HEP due  to pain, willing to try whatever today and I will let you know if it makes me feel worse.                                                                                                                                                                                           Per eval - Its hard to pinpoint anything I did, but the back pain just "occurred". Sometimes its not as bad as others, other times its very difficult to walk or get up and down. No radicular sx or numbness/tingling from the back. No new B/B incontinence, no sudden weight loss, no major sleep issues due to pain. Have tried some old exercises "moving this way and that" and it really made my pain worse. I've been  wearing a back brace from [place] around the house.   SUBJECTIVE STATEMENT: 07/20/2022 [name] notes that she is limited in her standing and walking, particularly walking endurance.  She notes she can sit for longer periods of time before needing to change position due to low back pain.  She reports compliance with her home exercises.     PERTINENT HISTORY:  Anxiety, OA, HTN, obesity, SOB, knee surgery, shoulder surgery   PAIN:  Are you having pain? Yes: NPRS scale: 1 now, has been 9-10 at worst in the past week/10 Pain location: lumbar- general discomfort, knees aching but comes and goes Pain description: see above  Aggravating factors: lifting a lot, pulling, more than normal physical activity, prolonged standing and walking  Relieving factors: sitting down, heat   At worst can get to 5/10 (07/12/22)  PRECAUTIONS: ICD/Pacemaker  WEIGHT BEARING RESTRICTIONS: No  FALLS:  Has patient fallen in last 6 months? No, some falls >6 months ago, mild FOF on uneven surfaces such as grass   LIVING ENVIRONMENT: Lives with: lives with their spouse Lives in: House/apartment handicap  Stairs: level entry in the back, no steps inside the home  Has following equipment at home: Dan Humphreys - 2 wheeled and Grab bars  OCCUPATION: retired   PLOF: Independent, Independent with basic ADLs, Independent with gait, and Independent with transfers  PATIENT GOALS: get rid of back pain and be more steady on my feet   NEXT MD VISIT: PRN with referring   OBJECTIVE: (objective measures completed at initial evaluation unless otherwise dated)   DIAGNOSTIC FINDINGS:  Lumbar spine grade 1 spondylolisthesis L4-5.  Lower lumbar facet changes.   No acute fractures or acute findings.  PATIENT SURVEYS:  FOTO 56, predicted 65 in 11 visits; 07/12/22- 52   SCREENING FOR RED FLAGS: Bowel or bladder incontinence: No Spinal tumors: No Cauda  equina syndrome: No Compression fracture: No Abdominal aneurysm:  No  COGNITION: Overall cognitive status: Within functional limits for tasks assessed     SENSATION: Not tested  MUSCLE LENGTH: Hamstrings and piriformis WNL B Hip flexors tight observation, not formally tested   POSTURE: rounded shoulders, forward head, increased lumbar lordosis, increased thoracic kyphosis, and flexed trunk   PALPATION:  No tenderness or mm spasms noted L spine in sitting   LUMBAR ROM:   AROM eval 07/12/22  Flexion WNL but very cautious movement, RFIS no change WNL much less cautious   Extension Hypermobile, deferred REIS due to hx of spondylolisthesis    Right lateral flexion WNL    Left lateral flexion WNL    Right rotation    Left rotation     (Blank rows = not tested)    LOWER EXTREMITY MMT:    MMT Right eval Left eval Right 07/12/22 Left 07/12/22  Hip flexion 4 4- 4- 4-  Hip extension      Hip abduction 3 3 3 3   Hip adduction      Hip internal rotation      Hip external rotation      Knee flexion 4 4+ 5 5  Knee extension 4- pain 3 pain 4+ 4+  Ankle dorsiflexion 4 4+ 5 5  Ankle plantarflexion      Ankle inversion      Ankle eversion       (Blank rows = not tested)  LUMBAR SPECIAL TESTS:  Straight leg raise test: Negative  FUNCTIONAL TESTS:  Berg Balance Scale: 39/56; 07/12/22- 46/56    TODAY'S TREATMENT:                                                                                                                              [facility] Adult PT Treatment:                                                DATE:    07/20/22  SelfCare  Spent some time discussing potential TKR coming up in the future (about 7 weeks per her discussion with MD)- what to expect from this surgery in terms of PT, outpatient procedure with same day return home vs inpatient hospital stay from PT perspective/benefit of PT, briefly discussed current POC in context of possible upcoming surgery/any possible visit limits, benefit of current PT to help prep for surgery down  the line  TherEx  TA set+ march x15 B Scap retractions x15 TA set + lateral trunk lean/elbow touch to mat x10 B Sidelying hip ABD x10 B   NMR  Tandem stance solid surface 3x30 seconds  Narrow BOS blue foam pad 3x30 seconds  Standing with one foot on blue foam pad + one on 4 inch box 3x30 seconds B  07/11/22  Objective measures, goal review, education on progress and POC moving forward  TherEX  Bridges x15  TA set + march red TB around knees x15 B Standing hip ABD at counter + TA set x10 B    07/04/2022 Lumbar extension AROM 10 x 3seconds Shoulder blade pinches 10 x 5 seconds Yoga Bridge 4 sets of 5 for 3 seconds Heel to toe raises with transversus abdominus contraction 10 x 3 seconds Alternating hip hike 2 sets of 10 for 3 seconds  Functional Activities: Reviewed imaging with spine model so Kriste Basque would have a better understanding of what is going on with her spine anatomically.  Reviewed practical golfers left, logroll and discussed proper lumbar roll use when sitting.  Discussed the importance of changing positions frequently, maintaining good position and avoiding slouched, bent postures.  Discussed walking for exercises either with a shopping cart to improve endurance work doing short walks at home and stopping to do standing exercises to avoid symptom exacerbation and build endurance.   06/28/22  TherEx  Bridges 2 x10 PPT 2x10 with 3 second holds  Seated marches x10 B + TA set  Lateral trunk crunches x10 B STS with red TB above knees 21" mat table no UEs x7 Standing marches + TA set red TB above knees x5 B Standing hip ABD x5 with red TB above knees   NMR  Tandem stance solid surface 3x30 seconds B Narrow BOS 3x30 seconds ( 2 with EO, 1 with EC) solid surface Standing marches solid surface alternating taps on 6 inch step x10   06/21/22 Therapeutic Exercise: 6# suitcase carry 3x51ft cues for pacing Heel raises 2x12 // bars cues for pacing Fwd/retro  walking 3 laps in // bars no UE support, CGA for retro, cues for posture and step length Seated RTB paloff press 2x8 each direction cues for posture and pacing Seated pelvic tilts 2x15 cues for reduced compensations at T spine, breath control  HEP handout/update + education   PATIENT EDUCATION:  Education details:  rationale for interventions, HEP  Person educated: Patient Education method: Explanation, Demonstration, and Handouts Education comprehension: verbalized understanding, returned demonstration, and needs further education  HOME EXERCISE PROGRAM: Access Code: [email or web address] URL: [email or web address] Date: 06/21/2022 Prepared by: Fransisco Hertz  Exercises - Supine Posterior Pelvic Tilt  - 2-3 x daily - 7 x weekly - 1 sets - 15 reps - 3 hold - Seated Hip Abduction with Resistance  - 2-3 x daily - 7 x weekly - 1 sets - 10 reps - Seated Hip Adduction Isometrics with Ball  - 2-3 x daily - 7 x weekly - 1 sets - 15 reps - Heel Raises with Counter Support  - 2-3 x daily - 7 x weekly - 1 sets - 10 reps - Supine Bridge  - 2-3 x daily - 7 x weekly - 1 sets - 10 reps  ASSESSMENT:  CLINICAL IMPRESSION:    Kriste Basque arrives today doing OK, sounds like she may be getting a TKR in the near future (per her report, sounds like there may be as much as a 7 week wait due to anesthesia staffing shortages).  Discussed TKR surgery/recovery from PT perspective (see above, otherwise worked on functional strengthening and balance as able. Will continue to progress towards LTGs and assist in physically preparing for upcoming procedure.   Per eval - Patient is a 77 y.o. F who was seen today for physical therapy evaluation and treatment for low back pain. Exam reveals significant functional muscle  weakness (primarily in legs and core), postural impairments, and significant balance impairments. Does have a hx of some vertigo, we can plan to address in PT with a new referral once her back and balance  issues are taken care of. Will benefit from skilled PT services to address all impairments, reduce fall risk, and assist in return to optimal level of function.   OBJECTIVE IMPAIRMENTS: decreased balance, decreased mobility, difficulty walking, decreased strength, postural dysfunction, obesity, and pain.   ACTIVITY LIMITATIONS: standing, squatting, stairs, transfers, and locomotion level  PARTICIPATION LIMITATIONS: shopping, community activity, yard work, and church  PERSONAL FACTORS: Age, Behavior pattern, Fitness, Past/current experiences, Sex, and Time since onset of injury/illness/exacerbation are also affecting patient's functional outcome.   REHAB POTENTIAL: Good  CLINICAL DECISION MAKING: Stable/uncomplicated  EVALUATION COMPLEXITY: Low   GOALS: Goals reviewed with patient? Yes  SHORT TERM GOALS: Target date: 08/09/2022    Will be complaint with appropriate progressive HEP  Baseline: 06/21/22: good HEP adherence reported  Goal status: MET 07/04/2022  2.  Pain to be no more than 6/10 at worst  Baseline:  06/21/22: 1/10 pain today, back does not exceed 6/10  Goal status: MET 07/04/2022  3.  Will demonstrate improved biomechanics and posture with all functional tasks  Baseline:  06/21/22: improved mechanics w/ transfers and gait  Goal status: On Going 07/11/2022   LONG TERM GOALS: Target date: 09/06/2022    MMT to improve by at least 1 grade in all weak groups  Baseline:  Goal status: IN PROGRESS 07/11/22  2.  Will score at least 48 on Berg to show reduced overall fall risk  Baseline:  Goal status: IN PROGRESS 07/11/22  3.  Pain to be no more than 4/10 at worst  Baseline:  Goal status: MET for back  not knees 07/11/2022  4.  Will demonstrate ability to ambulate over grass/gravel no device with minimal fall risk  Baseline:  Goal status: IN PROGRESS 07/11/22- subjectively improving   5.  Will be able to complete all house keeping tasks and daily activities without  significant increase in back pain from resting level  Baseline:  Goal status: On Going 07/11/2022  6.  FOTO score to be within 5 points of goal level by DC  Baseline:  Goal status: IN PROGRESS 07/11/22  PLAN:  PT FREQUENCY: 1x/week  PT DURATION: 8 weeks  PLANNED INTERVENTIONS: Therapeutic exercises, Therapeutic activity, Neuromuscular re-education, Balance training, Gait training, Patient/Family education, Self Care, Joint mobilization, Stair training, Aquatic Therapy, Dry Needling, Electrical stimulation, Cryotherapy, Moist heat, Taping, Ionotophoresis 4mg /ml Dexamethasone, Manual therapy, and Re-evaluation.  PLAN FOR NEXT SESSION: Continue to work on postural and lumbar strength and endurance.  Progress lumbar extensors, hip abductors quadratus lumbar arm strength as appropriate.  Review postural and body mechanics work so she may be more functional at home. Any updates on surgical date?   Nedra Hai, PT, DPT 07/20/22 12:34 PM

## 2022-07-23 ENCOUNTER — Telehealth: Payer: Self-pay | Admitting: Physical Medicine and Rehabilitation

## 2022-07-23 ENCOUNTER — Ambulatory Visit (INDEPENDENT_AMBULATORY_CARE_PROVIDER_SITE_OTHER): Payer: Medicare Other | Admitting: Physical Therapy

## 2022-07-23 DIAGNOSIS — R2681 Unsteadiness on feet: Secondary | ICD-10-CM

## 2022-07-23 DIAGNOSIS — R262 Difficulty in walking, not elsewhere classified: Secondary | ICD-10-CM | POA: Diagnosis not present

## 2022-07-23 DIAGNOSIS — G8929 Other chronic pain: Secondary | ICD-10-CM | POA: Diagnosis not present

## 2022-07-23 DIAGNOSIS — M545 Low back pain, unspecified: Secondary | ICD-10-CM | POA: Diagnosis not present

## 2022-07-23 DIAGNOSIS — M6281 Muscle weakness (generalized): Secondary | ICD-10-CM

## 2022-07-23 NOTE — Telephone Encounter (Signed)
Patient called back from a missed called. Concerning and abrasion on her back.CB#[phone]

## 2022-07-23 NOTE — Therapy (Addendum)
OUTPATIENT PHYSICAL THERAPY TREATMENT NOTE  /DISCHARGE   Patient Name: Crystal Jackson MRN: [id number] DOB:[birth year], 77 y.o., female Today's Date: 07/23/2022       END OF SESSION:  PT End of Session - 07/23/22 1033     Visit Number 9    Number of Visits 15    Date for PT Re-Evaluation 09/06/22    Authorization Type [employer] Medicare    Authorization Time Period 05/25/22 to 07/20/22    Progress Note Due on Visit 17    PT Start Time 1020    PT Stop Time 1103    PT Time Calculation (min) 43 min    Activity Tolerance Patient tolerated treatment well;No increased pain    Behavior During Therapy WFL for tasks assessed/performed                 Past Medical History:  Diagnosis Date   Anxiety    Arthritis    Asthma    Back pain    Bilateral swelling of feet    Constipation    Depression    Gallbladder problem    GERD (gastroesophageal reflux disease)    History of low potassium    Hypertension    Joint pain    Obesity    Palpitations    Shortness of breath    Sleep apnea    Past Surgical History:  Procedure Laterality Date   ABDOMINAL HYSTERECTOMY     APPENDECTOMY     CHOLECYSTECTOMY     KNEE SURGERY Left    SHOULDER SURGERY Right    Patient Active Problem List   Diagnosis Date Noted   Unilateral primary osteoarthritis, right knee 07/12/2022   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   BMI 40.0-44.9, adult (HCC)-current bmi 37.3 03/20/2022   Morbid obesity (HCC) 01/30/2022   BMI 37.0-37.9, adult 01/30/2022   B12 deficiency due to diet 09/27/2021   SOB (shortness of breath) on exertion 09/27/2021   Chronic vertigo 09/27/2021   Other hyperlipidemia 08/28/2021   Vitamin D deficiency 08/28/2021   Pre-diabetes 06/28/2021   Sepsis (HCC) 05/15/2021   Hypoxia 05/15/2021   Essential hypertension 05/15/2021   DM (diabetes mellitus), type 2 (HCC) 05/15/2021   Hypomagnesemia 05/15/2021   UTI (urinary tract infection) 05/14/2021   Acute exacerbation of chronic  low back pain 03/10/2021   OSA on CPAP 09/03/2018   Mild persistent asthma without complication 09/03/2018   Class 3 severe obesity with serious comorbidity and body mass index (BMI) of 40.0 to 44.9 in adult (HCC) 04/14/2018   Unilateral primary osteoarthritis, left knee 04/14/2018   Asthma in adult, mild intermittent, uncomplicated 02/26/2017   Allergic rhinitis 09/05/2013    PCP: Dorinda Hill MD   REFERRING PROVIDER: Dorinda Hill MD   REFERRING DIAG: M54.50,G89.29 (ICD-10-CM) - Chronic right-sided low back pain without sciatica M47.816 (ICD-10-CM) - Spondylosis without myelopathy or radiculopathy, lumbar region M47.816 (ICD-10-CM) - Facet arthropathy, lumbar  Rationale for Evaluation and Treatment: Rehabilitation  THERAPY DIAG:  Chronic bilateral low back pain without sciatica  Difficulty in walking, not elsewhere classified  Muscle weakness (generalized)  Unsteadiness on feet  ONSET DATE: 6-8 weeks ago   SUBJECTIVE:    SUBJECTIVE STATEMENT: 07/23/2022 "Not good." Reporting weather may be causing some of the pain in her knees and back.  Per eval - Its hard to pinpoint anything I did, but the back pain just "occurred". Sometimes its not as bad as others, other times its very difficult to walk or get up and down. No radicular sx or numbness/tingling from the back. No new B/B incontinence, no sudden weight loss, no major sleep issues due to pain. Have tried some old exercises "moving this way and that" and it really made my pain worse. I've been wearing a back brace from [employer] around the house.     PERTINENT HISTORY:  Anxiety, OA, HTN, obesity, SOB, knee surgery, shoulder surgery   PAIN:  Are you having pain? Yes: NPRS scale: 4 now, has been 9-10 at worst in the past week/10 Pain location: Lt  knee Pain description: see above  Aggravating factors: lifting a lot, pulling, more than normal physical activity, prolonged standing and walking  Relieving factors: sitting down, heat   At worst can get to 5/10 (07/12/22)  PRECAUTIONS: ICD/Pacemaker  WEIGHT BEARING RESTRICTIONS: No  FALLS:  Has patient fallen in last 6 months? No, some falls >6 months ago, mild FOF on uneven surfaces such as grass   LIVING ENVIRONMENT: Lives with: lives with their spouse Lives in: House/apartment handicap  Stairs: level entry in the back, no steps inside the home  Has following equipment at home: Dan Humphreys - 2 wheeled and Grab bars  OCCUPATION: retired   PLOF: Independent, Independent with basic ADLs, Independent with gait, and Independent with transfers  PATIENT GOALS: get rid of back pain and be more steady on my feet   NEXT MD VISIT: PRN with referring   OBJECTIVE: (objective measures completed at initial evaluation unless otherwise dated)   DIAGNOSTIC FINDINGS:  Lumbar spine grade 1 spondylolisthesis L4-5.  Lower lumbar facet changes.   No acute fractures or acute findings.  PATIENT SURVEYS:  FOTO 56, predicted 65 in 11 visits; 07/12/22- 52   SCREENING FOR RED FLAGS: Bowel or bladder incontinence: No Spinal tumors: No Cauda equina syndrome: No Compression fracture: No Abdominal aneurysm: No  COGNITION: Overall cognitive status: Within functional limits for tasks assessed     SENSATION: Not tested  MUSCLE LENGTH: Hamstrings and piriformis WNL B Hip flexors tight observation, not formally tested   POSTURE: rounded shoulders, forward head, increased lumbar lordosis, increased thoracic kyphosis, and flexed trunk   PALPATION:  No tenderness or mm spasms noted L spine in sitting   LUMBAR ROM:   AROM eval 07/12/22  Flexion WNL but very cautious movement, RFIS no change WNL much less cautious   Extension Hypermobile, deferred REIS due to hx of spondylolisthesis    Right lateral  flexion WNL    Left lateral flexion WNL     (Blank rows = not tested)    LOWER EXTREMITY MMT:    MMT Right eval Left eval Right 07/12/22 Left 07/12/22  Hip flexion 4 4- 4- 4-  Hip extension      Hip abduction 3 3 3 3   Hip adduction      Hip internal rotation      Hip external rotation      Knee flexion 4 4+ 5 5  Knee extension 4- pain 3 pain 4+ 4+  Ankle dorsiflexion 4 4+ 5 5   (Blank rows = not tested)  LUMBAR SPECIAL TESTS:  EVAL: Straight leg raise test: Negative  FUNCTIONAL TESTS:  EVAL: Berg Balance Scale: 39/56; 07/12/22- 46/56    TODAY'S TREATMENT:  [facility] Adult PT Treatment:                                                DATE:  07/23/22 TherEx NuStep L5 x 8 min Rows L3 band 2x10; 5 sec hold Bil shoulder ext L3 band 2x10 Supine post pelvic tilt 2x10; 5 sec hold Bridges 2x10 Single limb clamshell in hooklying 2x10 bil; L4 band Seated LAQ 4# 2x10 bil    07/20/22 SelfCare Spent some time discussing potential TKR coming up in the future (about 7 weeks per her discussion with MD)- what to expect from this surgery in terms of PT, outpatient procedure with same day return home vs inpatient hospital stay from PT perspective/benefit of PT, briefly discussed current POC in context of possible upcoming surgery/any possible visit limits, benefit of current PT to help prep for surgery down the line  TherEx TA set+ march x15 B Scap retractions x15 TA set + lateral trunk lean/elbow touch to mat x10 B Sidelying hip ABD x10 B  NMR Tandem stance solid surface 3x30 seconds  Narrow BOS blue foam pad 3x30 seconds  Standing with one foot on blue foam pad + one on 4 inch box 3x30 seconds B    07/11/22 Objective measures, goal review, education on progress and POC moving forward  TherEX Bridges x15  TA set + march red TB around knees x15 B Standing hip  ABD at counter + TA set x10 B    07/04/2022 Lumbar extension AROM 10 x 3seconds Shoulder blade pinches 10 x 5 seconds Yoga Bridge 4 sets of 5 for 3 seconds Heel to toe raises with transversus abdominus contraction 10 x 3 seconds Alternating hip hike 2 sets of 10 for 3 seconds  Functional Activities: Reviewed imaging with spine model so Kriste Basque would have a better understanding of what is going on with her spine anatomically.  Reviewed practical golfers left, logroll and discussed proper lumbar roll use when sitting.  Discussed the importance of changing positions frequently, maintaining good position and avoiding slouched, bent postures.  Discussed walking for exercises either with a shopping cart to improve endurance work doing short walks at home and stopping to do standing exercises to avoid symptom exacerbation and build endurance.   PATIENT EDUCATION:  Education details:  rationale for interventions, HEP  Person educated: Patient Education method: Explanation, Demonstration, and Handouts Education comprehension: verbalized understanding, returned demonstration, and needs further education  HOME EXERCISE PROGRAM: Access Code: [email or web address] URL: [email or web address] Date: 06/21/2022 Prepared by: Fransisco Hertz  Exercises - Supine Posterior Pelvic Tilt  - 2-3 x daily - 7 x weekly - 1 sets - 15 reps - 3 hold - Seated Hip Abduction with Resistance  - 2-3 x daily - 7 x weekly - 1 sets - 10 reps - Seated Hip Adduction Isometrics with Ball  - 2-3 x daily - 7 x weekly - 1 sets - 15 reps - Heel Raises with Counter Support  - 2-3 x daily - 7 x weekly - 1 sets - 10 reps - Supine Bridge  - 2-3 x daily - 7 x weekly - 1 sets - 10 reps  ASSESSMENT:  CLINICAL IMPRESSION: Pt tolerated session well today and overall doing okay despite elevated pain.  She is planning on TKA in the near future and may want to hold PT until after surgery.  Will continue  to benefit from PT to maximize  function.  Per eval - Patient is a 77 y.o. F who was seen today for physical therapy evaluation and treatment for low back pain. Exam reveals significant functional muscle weakness (primarily in legs and core), postural impairments, and significant balance impairments. Does have a hx of some vertigo, we can plan to address in PT with a new referral once her back and balance issues are taken care of. Will benefit from skilled PT services to address all impairments, reduce fall risk, and assist in return to optimal level of function.   OBJECTIVE IMPAIRMENTS: decreased balance, decreased mobility, difficulty walking, decreased strength, postural dysfunction, obesity, and pain.   ACTIVITY LIMITATIONS: standing, squatting, stairs, transfers, and locomotion level  PARTICIPATION LIMITATIONS: shopping, community activity, yard work, and church  PERSONAL FACTORS: Age, Behavior pattern, Fitness, Past/current experiences, Sex, and Time since onset of injury/illness/exacerbation are also affecting patient's functional outcome.   REHAB POTENTIAL: Good  CLINICAL DECISION MAKING: Stable/uncomplicated  EVALUATION COMPLEXITY: Low   GOALS: Goals reviewed with patient? Yes  SHORT TERM GOALS: Target date: 08/09/2022    Will be complaint with appropriate progressive HEP  Baseline: 06/21/22: good HEP adherence reported  Goal status: MET 07/04/2022  2.  Pain to be no more than 6/10 at worst  Baseline:  06/21/22: 1/10 pain today, back does not exceed 6/10  Goal status: MET 07/04/2022  3.  Will demonstrate improved biomechanics and posture with all functional tasks  Baseline:  06/21/22: improved mechanics w/ transfers and gait  Goal status: On Going 07/11/2022   LONG TERM GOALS: Target date: 09/06/2022    MMT to improve by at least 1 grade in all weak groups  Baseline:  Goal status: IN PROGRESS 07/11/22  2.  Will score at least 48 on Berg to show reduced overall fall risk  Baseline:  Goal status: IN  PROGRESS 07/11/22  3.  Pain to be no more than 4/10 at worst  Baseline:  Goal status: MET for back  not knees 07/11/2022  4.  Will demonstrate ability to ambulate over grass/gravel no device with minimal fall risk  Baseline:  Goal status: IN PROGRESS 07/11/22- subjectively improving   5.  Will be able to complete all house keeping tasks and daily activities without significant increase in back pain from resting level  Baseline:  Goal status: On Going 07/11/2022  6.  FOTO score to be within 5 points of goal level by DC  Baseline:  Goal status: IN PROGRESS 07/11/22  PLAN:  PT FREQUENCY: 1x/week  PT DURATION: 8 weeks  PLANNED INTERVENTIONS: Therapeutic exercises, Therapeutic activity, Neuromuscular re-education, Balance training, Gait training, Patient/Family education, Self Care, Joint mobilization, Stair training, Aquatic Therapy, Dry Needling, Electrical stimulation, Cryotherapy, Moist heat, Taping, Ionotophoresis 4mg /ml Dexamethasone, Manual therapy, and Re-evaluation.  PLAN FOR NEXT SESSION: may want to hold PT,  Continue to work on postural and lumbar strength and endurance.  Progress lumbar extensors, hip abductors quadratus lumbar arm strength as appropriate.  Review postural and body mechanics work so she may be more functional at home.     Clarita Crane, PT, DPT 07/23/22 11:13 AM   PHYSICAL THERAPY DISCHARGE SUMMARY  Visits from Start of Care: 9  Current functional level related to goals / functional outcomes: See note   Remaining deficits: See note   Education / Equipment: HEP  Patient goals were partially met. Patient is being discharged due to not returning since the last visit.  Chyrel Masson, PT, DPT, OCS,  ATC 08/29/22  9:55 AM

## 2022-07-24 NOTE — Telephone Encounter (Signed)
Spoke with patient and scheduled injection for 08/02/22. Patient aware driver needed

## 2022-07-31 ENCOUNTER — Encounter: Payer: Medicare Other | Admitting: Physical Therapy

## 2022-08-02 ENCOUNTER — Ambulatory Visit: Payer: Medicare Other | Admitting: Physical Medicine and Rehabilitation

## 2022-08-02 ENCOUNTER — Other Ambulatory Visit: Payer: Self-pay

## 2022-08-02 VITALS — BP 125/78 | HR 66

## 2022-08-02 DIAGNOSIS — M47816 Spondylosis without myelopathy or radiculopathy, lumbar region: Secondary | ICD-10-CM | POA: Diagnosis not present

## 2022-08-02 MED ORDER — BUPIVACAINE HCL 0.5 % IJ SOLN
3.0000 mL | Freq: Once | INTRAMUSCULAR | Status: AC
Start: 2022-08-02 — End: 2022-08-02
  Administered 2022-08-02: 3 mL

## 2022-08-02 NOTE — Patient Instructions (Signed)

## 2022-08-02 NOTE — Progress Notes (Signed)
Functional Pain Scale - descriptive words and definitions  Moderate (4)   Constantly aware of pain, can complete ADLs with modification/sleep marginally affected at times/passive distraction is of no use, but active distraction gives some relief. Moderate range order  Average Pain 5   +Driver, -BT, -Dye Allergies.  Lower back pain on right side with no radiation

## 2022-08-06 ENCOUNTER — Other Ambulatory Visit: Payer: Self-pay | Admitting: Physical Medicine and Rehabilitation

## 2022-08-06 DIAGNOSIS — M545 Low back pain, unspecified: Secondary | ICD-10-CM

## 2022-08-06 DIAGNOSIS — M47816 Spondylosis without myelopathy or radiculopathy, lumbar region: Secondary | ICD-10-CM

## 2022-08-06 MED ORDER — DIAZEPAM 5 MG PO TABS: ORAL_TABLET | ORAL | 0 refills | Status: DC

## 2022-08-10 ENCOUNTER — Encounter: Payer: Self-pay | Admitting: Physical Medicine and Rehabilitation

## 2022-08-14 ENCOUNTER — Encounter: Payer: Self-pay | Admitting: Physical Medicine and Rehabilitation

## 2022-08-14 ENCOUNTER — Ambulatory Visit: Payer: Medicare Other | Admitting: Physical Medicine and Rehabilitation

## 2022-08-14 DIAGNOSIS — M7918 Myalgia, other site: Secondary | ICD-10-CM

## 2022-08-14 DIAGNOSIS — M47816 Spondylosis without myelopathy or radiculopathy, lumbar region: Secondary | ICD-10-CM | POA: Diagnosis not present

## 2022-08-14 DIAGNOSIS — M545 Low back pain, unspecified: Secondary | ICD-10-CM | POA: Diagnosis not present

## 2022-08-14 DIAGNOSIS — G8929 Other chronic pain: Secondary | ICD-10-CM

## 2022-08-14 MED ORDER — TRAMADOL HCL 50 MG PO TABS: 50.0000 mg | ORAL_TABLET | Freq: Three times a day (TID) | ORAL | 0 refills | Status: DC | PRN

## 2022-08-14 MED ORDER — BACLOFEN 10 MG PO TABS
10.0000 mg | ORAL_TABLET | Freq: Three times a day (TID) | ORAL | 0 refills | Status: DC
Start: 2022-08-14 — End: 2022-09-20

## 2022-08-14 NOTE — Progress Notes (Signed)
Crystal Jackson - 77 y.o. female MRN [id number]  Date of birth: May 15, 1945  Office Visit Note: Visit Date: 08/02/2022 PCP: Melida Quitter, MD Referred by: Melida Quitter, MD  Subjective: Chief Complaint  Patient presents with   Lower Back - Pain   HPI:  Crystal Jackson is a 77 y.o. female who comes in today for planned repeat Right L4-5 Lumbar facet/medial branch block with fluoroscopic guidance.  The patient has failed conservative care including home exercise, medications, time and activity modification.  This injection will be diagnostic and hopefully therapeutic.  Please see requesting physician notes for further details and justification.  Exam shows concordant low back pain with facet joint loading and extension. Patient received more than 80% pain relief from prior injection. This would be the second block in a diagnostic double block paradigm.     Referring:Megan Mayford Knife, FNP   ROS Otherwise per HPI.  Assessment & Plan: Visit Diagnoses:    ICD-10-CM   1. Spondylosis without myelopathy or radiculopathy, lumbar region  M47.816 XR C-ARM NO REPORT    Nerve Block    bupivacaine (MARCAINE) 0.5 % (with pres) injection 3 mL      Plan: No additional findings.   Meds & Orders:  Meds ordered this encounter  Medications   bupivacaine (MARCAINE) 0.5 % (with pres) injection 3 mL    Orders Placed This Encounter  Procedures   Nerve Block   XR C-ARM NO REPORT    Follow-up: Return for Review Pain Diary.   Procedures: No procedures performed  Lumbar Diagnostic Facet Joint Nerve Block with Fluoroscopic Guidance   [name]      Date of Birth: 1945-05-06 MRN: [id number] PCP: Melida Quitter, MD      Visit Date: 08/02/2022   Universal Protocol:    Date/Time: 08/13/241:01 PM  Consent Given By: the patient  Position: PRONE  Additional Comments: Vital signs were monitored before and after the procedure. Patient was prepped and draped in the usual sterile  fashion. The correct patient, procedure, and site was verified.   Injection Procedure Details:   Procedure diagnoses:  1. Spondylosis without myelopathy or radiculopathy, lumbar region      Meds Administered:  Meds ordered this encounter  Medications   bupivacaine (MARCAINE) 0.5 % (with pres) injection 3 mL     Laterality: Right  Location/Site: L4-L5, L3 and L4 medial branches  Needle: 5.0 in., 25 ga.  Short bevel or Quincke spinal needle  Needle Placement: Oblique pedical  Findings:   -Comments: There was excellent flow of contrast along the articular pillars without intravascular flow.  Procedure Details: The fluoroscope beam is vertically oriented in AP and then obliqued 15 to 20 degrees to the ipsilateral side of the desired nerve to achieve the "[name] dog" appearance.  The skin over the target area of the junction of the superior articulating process and the transverse process (sacral ala if blocking the L5 dorsal rami) was locally anesthetized with a 1 ml volume of 1% Lidocaine without Epinephrine.  The spinal needle was inserted and advanced in a trajectory view down to the target.   After contact with periosteum and negative aspirate for blood and CSF, correct placement without intravascular or epidural spread was confirmed by injecting 0.5 ml. of Isovue-250.  A spot radiograph was obtained of this image.    Next, a 0.5 ml. volume of the injectate described above was injected. The needle was then redirected to the other facet joint nerves mentioned above  if needed.  Prior to the procedure, the patient was given a Pain Diary which was completed for baseline measurements.  After the procedure, the patient rated their pain every 30 minutes and will continue rating at this frequency for a total of 5 hours.  The patient has been asked to complete the Diary and return to [name] by mail, fax or hand delivered as soon as possible.   Additional Comments:  No complications  occurred Dressing: 2 x 2 sterile gauze and Band-Aid    Post-procedure details: Patient was observed during the procedure. Post-procedure instructions were reviewed.  Patient left the clinic in stable condition.   Clinical History: EXAM: MRI LUMBAR SPINE WITHOUT CONTRAST   TECHNIQUE: Multiplanar, multisequence MR imaging of the lumbar spine was performed. No intravenous contrast was administered.   COMPARISON:  Lumbar spine radiographs 02/05/2022, lumbar spine MRI 10/10/2018   FINDINGS: Segmentation: Standard; the lowest formed disc space is designated L5-S1.   Alignment: There is dextrocurvature centered at L2-L3 which appears increased since 2020. Trace retrolisthesis of T12 on L1 and trace anterolisthesis of L4 on L5 are unchanged.   Vertebrae: Vertebral body heights are preserved. Background marrow signal is normal. There is no suspicious marrow signal abnormality or marrow edema.   Conus medullaris and cauda equina: Conus extends to the L1 level. Conus and cauda equina appear normal. An incidental conjoined right L5-S1 nerve root is noted. 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   Paraspinal and other soft tissues: Unremarkable.   Disc levels:   There is mild disc desiccation and narrowing at T12-L1. There is otherwise overall mild desiccation without significant loss of height.   T12-L1: There is a mild disc bulge without significant spinal canal or neural foraminal stenosis, unchanged.   L1-L2: No significant spinal canal or neural foraminal stenosis   L2-L3: There is a mild disc bulge which is minimally increased since 2020 but without significant spinal canal or neural foraminal stenosis   L3-L4: There is a mild disc bulge with a superimposed right foraminal/extraforaminal protrusion which comes in close proximity to the exiting L3 nerve root but without significant spinal canal or neural foraminal stenosis, unchanged.   L4-L5: There is trace  anterolisthesis, and advanced left worse than right facet arthropathy with effusions resulting in mild left subarticular zone narrowing and no significant neural foraminal stenosis. The left subarticular zone narrowing is slightly worsened since 2020.   L5-S1: There is moderate bilateral facet arthropathy without significant spinal canal or neural foraminal stenosis, not significantly changed.   IMPRESSION: 1. 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 2. Moderate facet arthropathy at L5-S1 without significant spinal canal or neural foraminal stenosis, not significantly changed. 3. Right foraminal/extraforaminal protrusion at L3-L4 which comes in close proximity to the exiting L3 nerve root, unchanged. 4. Dextrocurvature centered at L2-L3 appears increased since 2020.     Electronically Signed   By: Lesia Hausen M.D.   On: 05/29/2022 17:08     Objective:  VS:  HT:    WT:   BMI:     BP:125/78  HR:66bpm  TEMP: ( )  RESP:  Physical Exam Vitals and nursing note reviewed.  Constitutional:      General: She is not in acute distress.    Appearance: Normal appearance. She is not ill-appearing.  HENT:     Head: Normocephalic and atraumatic.  Right Ear: External ear normal.     Left Ear: External ear normal.  Eyes:     Extraocular Movements: Extraocular movements intact.  Cardiovascular:     Rate and Rhythm: Normal rate.     Pulses: Normal pulses.  Pulmonary:     Effort: Pulmonary effort is normal. No respiratory distress.  Abdominal:     General: There is no distension.     Palpations: Abdomen is soft.  Musculoskeletal:        General: Tenderness present.     Cervical back: Neck supple.     Right lower leg: No edema.     Left lower leg: No edema.     Comments: Patient has good distal strength with no pain over  the greater trochanters.  No clonus or focal weakness.  Skin:    Findings: No erythema, lesion or rash.  Neurological:     General: No focal deficit present.     Mental Status: She is alert and oriented to person, place, and time.     Sensory: No sensory deficit.     Motor: No weakness or abnormal muscle tone.     Coordination: Coordination normal.  Psychiatric:        Mood and Affect: Mood normal.        Behavior: Behavior normal.      Imaging: No results found.

## 2022-08-14 NOTE — Progress Notes (Signed)
Functional Pain Scale - descriptive words and definitions  Moderate (4)   Constantly aware of pain, can complete ADLs with modification/sleep marginally affected at times/passive distraction is of no use, but active distraction gives some relief. Moderate range order  Average Pain 3-4, but can increase  Lower back pain on right side at the waist line

## 2022-08-14 NOTE — Progress Notes (Signed)
Crystal Jackson - 77 y.o. female MRN [id number]  Date of birth: 06-02-1945  Office Visit Note: Visit Date: 08/14/2022 PCP: Melida Quitter, MD Referred by: Melida Quitter, MD  Subjective: Chief Complaint  Patient presents with  . Lower Back - Pain   HPI: Crystal Jackson is a 77 y.o. female who comes in today for evaluation of chronic, worsening and severe right sided lower back pain. Also reports chronic myofascial pain radiating up her left back. Pain ongoing for several years, worsens with movement and activity. She describes pain as aching sensation, currently rates as 7 out of 10. Some relief of pain with home exercise regimen, rest and use of medications. Also reports intermittent use of lumbar brace and heating pad. Recently completed regimen of formal physical therapy, she reports increased pain with these treatments. Recent lumbar MRI imaging exhibits advanced left worse than right facet arthropathy at L4-L5 with associated effusions and trace anterolisthesis. Patient has undergone 2 sets of diagnostic facet injections on 04/04/2022 and 08/02/2022, she reports greater than 80% relief of pain with these injections. We have scheduled right L4-L5 radiofrequency ablation to be performed in our office on 08/29/2022. Patient requesting to be seen today due to chronic myofascial issues. Patient managed by Dr. Rudi Heap, PA from orthopedic standpoint. Patient uses cane to assist with ambulation. Patient denies focal weakness, numbness and tingling. No recent trauma or falls.    Review of Systems  Musculoskeletal:  Positive for back pain and myalgias.  Neurological:  Negative for tingling, sensory change, focal weakness and weakness.  All other systems reviewed and are negative.  Otherwise per HPI.  Assessment & Plan: Visit Diagnoses:    ICD-10-CM   1. Chronic right-sided low back pain without sciatica  M54.50    G89.29     2. Spondylosis without myelopathy or radiculopathy,  lumbar region  M47.816     3. Facet arthropathy, lumbar  M47.816     4. Myofascial pain syndrome  M79.18        Plan: Findings:  1. Chronic, worsening and severe right sided lower back pain. Patient continues to have severe pain despite good conservative therapies such as formal physical therapy, home exercise regimen, rest and use of medications. Patient clinical presentation and exam are consistent with facet mediated pain. She has pain with lumbar extension upon exam today. There is advanced left worse than right facet arthropathy at L4-L5 with associated effusions and trace anterolisthesis. Next step is to perform right L4-L5 radiofrequency ablation, she is schedule for this procedure on 8/28. She has no questions regarding ablation procedure at this time. I did write prescription for pre-procedure Valium. No red flag symptoms noted upon exam today.   2. Chronic, worsening and severe myofascial pain radiating up left side of her back. Patients clinical presentation and exam are consistent with myofascial pain syndrome. Tenderness noted to right lower back and right upper lumbar region. I also feel there could be a type of central sensitization syndrome such as fibromyalgia contributing to her pain. We discussed treatment plan in detail, options would include re-grouping with physical therapy and chronic pain management. She reports Tramadol seems to help her pain the most. I refilled short course of Tramadol, if she wishes to stay on this medication long term I am happy to place referral to pain management specialist.     Meds & Orders:  Meds ordered this encounter  Medications  . baclofen (LIORESAL) 10 MG tablet    Sig: Take  1 tablet (10 mg total) by mouth 3 (three) times daily.    Dispense:  30 each    Refill:  0  . traMADol (ULTRAM) 50 MG tablet    Sig: Take 1 tablet (50 mg total) by mouth every 8 (eight) hours as needed.    Dispense:  20 tablet    Refill:  0   No orders of the defined  types were placed in this encounter.   Follow-up: Return for Right L4-L5 radiofrequency ablation, procedure schedule on 08/29/2022.   Procedures: No procedures performed      Clinical History: EXAM: MRI LUMBAR SPINE WITHOUT CONTRAST   TECHNIQUE: Multiplanar, multisequence MR imaging of the lumbar spine was performed. No intravenous contrast was administered.   COMPARISON:  Lumbar spine radiographs 02/05/2022, lumbar spine MRI 10/10/2018   FINDINGS: Segmentation: Standard; the lowest formed disc space is designated L5-S1.   Alignment: There is dextrocurvature centered at L2-L3 which appears increased since 2020. Trace retrolisthesis of T12 on L1 and trace anterolisthesis of L4 on L5 are unchanged.   Vertebrae: Vertebral body heights are preserved. Background marrow signal is normal. There is no suspicious marrow signal abnormality or marrow edema.   Conus medullaris and cauda equina: Conus extends to the L1 level. Conus and cauda equina appear normal. An incidental conjoined right L5-S1 nerve root is noted. 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   Paraspinal and other soft tissues: Unremarkable.   Disc levels:   There is mild disc desiccation and narrowing at T12-L1. There is otherwise overall mild desiccation without significant loss of height.   T12-L1: There is a mild disc bulge without significant spinal canal or neural foraminal stenosis, unchanged.   L1-L2: No significant spinal canal or neural foraminal stenosis   L2-L3: There is a mild disc bulge which is minimally increased since 2020 but without significant spinal canal or neural foraminal stenosis   L3-L4: There is a mild disc bulge with a superimposed right foraminal/extraforaminal protrusion which comes in close proximity to the exiting L3 nerve root but without significant spinal canal or neural foraminal stenosis, unchanged.   L4-L5: There is trace anterolisthesis, and advanced left worse  than right facet arthropathy with effusions resulting in mild left subarticular zone narrowing and no significant neural foraminal stenosis. The left subarticular zone narrowing is slightly worsened since 2020.   L5-S1: There is moderate bilateral facet arthropathy without significant spinal canal or neural foraminal stenosis, not significantly changed.   IMPRESSION: 1. 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 2. Moderate facet arthropathy at L5-S1 without significant spinal canal or neural foraminal stenosis, not significantly changed. 3. Right foraminal/extraforaminal protrusion at L3-L4 which comes in close proximity to the exiting L3 nerve root, unchanged. 4. Dextrocurvature centered at L2-L3 appears increased since 2020.     Electronically Signed   By: Lesia Hausen M.D.   On: 05/29/2022 17:08   She reports that she has never smoked. She has never been exposed to tobacco smoke. She has never used smokeless tobacco.  Recent Labs    09/27/21 1141 01/29/22 1304  HGBA1C 5.7* 5.7*    Objective:  VS:  HT:    WT:   BMI:     BP:   HR: bpm  TEMP: ( )  RESP:  Physical Exam Vitals and nursing note reviewed.  HENT:     Head: Normocephalic and atraumatic.  Right Ear: External ear normal.     Left Ear: External ear normal.     Nose: Nose normal.     Mouth/Throat:     Mouth: Mucous membranes are moist.  Eyes:     Pupils: Pupils are equal, round, and reactive to light.  Cardiovascular:     Rate and Rhythm: Normal rate.     Pulses: Normal pulses.  Pulmonary:     Effort: Pulmonary effort is normal.  Abdominal:     General: Abdomen is flat. There is no distension.  Musculoskeletal:        General: Tenderness present.     Cervical back: Normal range of motion.     Comments: Patient rises from seated position to  standing without difficulty. Pain noted with lumbar extension and facet loading. 5/5 strength noted with bilateral hip flexion, knee flexion/extension, ankle dorsiflexion/plantarflexion and EHL. No clonus noted bilaterally. No pain upon palpation of greater trochanters. No pain with internal/external rotation of bilateral hips. Sensation intact bilaterally. Tenderness noted to right lower back and upper lumbar region. Negative slump test bilaterally. Ambulates without aid, gait steady.     Skin:    General: Skin is warm and dry.     Capillary Refill: Capillary refill takes less than 2 seconds.  Neurological:     General: No focal deficit present.     Mental Status: She is alert and oriented to person, place, and time.  Psychiatric:        Mood and Affect: Mood normal.        Behavior: Behavior normal.    Ortho Exam  Imaging: No results found.  Past Medical/Family/Surgical/Social History: Medications & Allergies reviewed per EMR, new medications updated. Patient Active Problem List   Diagnosis Date Noted  . Unilateral primary osteoarthritis, right knee 07/12/2022  . 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  . BMI 40.0-44.9, adult (HCC)-current bmi 37.3 03/20/2022  . Morbid obesity (HCC) 01/30/2022  . BMI 37.0-37.9, adult 01/30/2022  . B12 deficiency due to diet 09/27/2021  . SOB (shortness of breath) on exertion 09/27/2021  . Chronic vertigo 09/27/2021  . Other hyperlipidemia 08/28/2021  . Vitamin D deficiency 08/28/2021  . Pre-diabetes 06/28/2021  . Sepsis (HCC) 05/15/2021  . Hypoxia 05/15/2021  . Essential hypertension 05/15/2021  . DM (diabetes mellitus), type 2 (HCC) 05/15/2021  . Hypomagnesemia 05/15/2021  . UTI (urinary tract infection) 05/14/2021  . Acute exacerbation of chronic low back pain 03/10/2021  . OSA on CPAP 09/03/2018  . Mild persistent asthma without complication 09/03/2018  . Class 3 severe obesity with serious comorbidity and body mass index (BMI) of 40.0 to  44.9 in adult (HCC) 04/14/2018  . Unilateral primary osteoarthritis, left knee 04/14/2018  . Asthma in adult, mild intermittent, uncomplicated 02/26/2017  . Allergic rhinitis 09/05/2013   Past Medical History:  Diagnosis Date  . Anxiety   . Arthritis   . Asthma   . Back pain   . Bilateral swelling of feet   . Constipation   . Depression   . Gallbladder problem   . GERD (gastroesophageal reflux disease)   . History of low potassium   . Hypertension   . Joint pain   . Obesity   . Palpitations   . Shortness of breath   . Sleep apnea    Family History  Problem Relation Age of Onset  . Diabetes Mother   . Heart disease Mother   . Anxiety disorder Mother   . Obesity Mother   . Diabetes Father   .  Hypertension Father   . Obesity Father    Past Surgical History:  Procedure Laterality Date  . ABDOMINAL HYSTERECTOMY    . APPENDECTOMY    . CHOLECYSTECTOMY    . KNEE SURGERY Left   . SHOULDER SURGERY Right    Social History   Occupational History  . Not on file  Tobacco Use  . Smoking status: Never    Passive exposure: Never  . Smokeless tobacco: Never  Vaping Use  . Vaping status: Never Used  Substance and Sexual Activity  . Alcohol use: No    Alcohol/week: 0.0 standard drinks of alcohol  . Drug use: No  . Sexual activity: Yes

## 2022-08-14 NOTE — Procedures (Signed)
Lumbar Diagnostic Facet Joint Nerve Block with Fluoroscopic Guidance   [name]      Date of Birth: [birth year] MRN: [id number] PCP: Melida Quitter, MD      Visit Date: 08/02/2022   Universal Protocol:    Date/Time: 08/13/241:01 PM  Consent Given By: the patient  Position: PRONE  Additional Comments: Vital signs were monitored before and after the procedure. Patient was prepped and draped in the usual sterile fashion. The correct patient, procedure, and site was verified.   Injection Procedure Details:   Procedure diagnoses:  1. Spondylosis without myelopathy or radiculopathy, lumbar region      Meds Administered:  Meds ordered this encounter  Medications   bupivacaine (MARCAINE) 0.5 % (with pres) injection 3 mL     Laterality: Right  Location/Site: L4-L5, L3 and L4 medial branches  Needle: 5.0 in., 25 ga.  Short bevel or Quincke spinal needle  Needle Placement: Oblique pedical  Findings:   -Comments: There was excellent flow of contrast along the articular pillars without intravascular flow.  Procedure Details: The fluoroscope beam is vertically oriented in AP and then obliqued 15 to 20 degrees to the ipsilateral side of the desired nerve to achieve the "[name] dog" appearance.  The skin over the target area of the junction of the superior articulating process and the transverse process (sacral ala if blocking the L5 dorsal rami) was locally anesthetized with a 1 ml volume of 1% Lidocaine without Epinephrine.  The spinal needle was inserted and advanced in a trajectory view down to the target.   After contact with periosteum and negative aspirate for blood and CSF, correct placement without intravascular or epidural spread was confirmed by injecting 0.5 ml. of Isovue-250.  A spot radiograph was obtained of this image.    Next, a 0.5 ml. volume of the injectate described above was injected. The needle was then redirected to the other facet joint nerves  mentioned above if needed.  Prior to the procedure, the patient was given a Pain Diary which was completed for baseline measurements.  After the procedure, the patient rated their pain every 30 minutes and will continue rating at this frequency for a total of 5 hours.  The patient has been asked to complete the Diary and return to [name] by mail, fax or hand delivered as soon as possible.   Additional Comments:  No complications occurred Dressing: 2 x 2 sterile gauze and Band-Aid    Post-procedure details: Patient was observed during the procedure. Post-procedure instructions were reviewed.  Patient left the clinic in stable condition.

## 2022-08-15 ENCOUNTER — Encounter: Payer: Medicare Other | Admitting: Physical Therapy

## 2022-08-21 ENCOUNTER — Encounter: Payer: Medicare Other | Admitting: Physical Therapy

## 2022-08-23 ENCOUNTER — Ambulatory Visit (INDEPENDENT_AMBULATORY_CARE_PROVIDER_SITE_OTHER): Payer: Medicare Other | Admitting: Family Medicine

## 2022-08-23 ENCOUNTER — Encounter (INDEPENDENT_AMBULATORY_CARE_PROVIDER_SITE_OTHER): Payer: Self-pay | Admitting: Family Medicine

## 2022-08-23 VITALS — BP 124/74 | HR 69 | Temp 97.5°F | Ht 64.0 in | Wt 222.0 lb

## 2022-08-23 DIAGNOSIS — R7303 Prediabetes: Secondary | ICD-10-CM

## 2022-08-23 DIAGNOSIS — Z6838 Body mass index (BMI) 38.0-38.9, adult: Secondary | ICD-10-CM

## 2022-08-23 NOTE — Progress Notes (Signed)
Chief Complaint:   OBESITY Crystal Jackson is here to discuss her progress with her obesity treatment plan along with follow-up of her obesity related diagnoses. Crystal Jackson is on keeping a food journal and adhering to recommended goals of 1150-1250 calories and 85+ grams of protein and states she is following her eating plan approximately 25% of the time. Crystal Jackson states she is doing 0 minutes 0 times per week.  Today's visit was #: 69 Starting weight: 242 lbs Starting date: 07/09/2018 Today's weight: 222 lbs Today's date: 08/23/2022 Total lbs lost to date: 20 Total lbs lost since last in-office visit: 0  Interim History: Patient is struggling more and more with increased temptations at home.  She is getting ready to have surgery.  Subjective:   1. Prediabetes Patient is on metformin, and she is working on her diet but she has struggled more recently.  She is working on getting back on track.  Assessment/Plan:   1. Prediabetes Patient will continue metformin and her diet, and we will recheck labs in 1 to 2 months.  2. BMI 38.0-38.9,adult  3. Morbid obesity (HCC)-start bmi 41.54/date 07/09/2018 Kolbe is currently in the action stage of change. As such, her goal is to continue with weight loss efforts. She has agreed to the Category 1 Plan.   Strategies to eat healthy while recovering from surgery were discussed today.  Behavioral modification strategies: dealing with family or coworker sabotage.  Crystal Jackson has agreed to follow-up with our clinic in 6 weeks. She was informed of the importance of frequent follow-up visits to maximize her success with intensive lifestyle modifications for her multiple health conditions.   Objective:   Blood pressure 124/74, pulse 69, temperature (!) 97.5 F (36.4 C), height 5\' 4"  (1.626 m), weight 222 lb (100.7 kg), SpO2 95%. Body mass index is 38.11 kg/m.  Lab Results  Component Value Date   CREATININE 1.06 (H) 01/29/2022   BUN 19 01/29/2022   NA  135 01/29/2022   K 4.8 01/29/2022   CL 97 01/29/2022   CO2 25 01/29/2022   Lab Results  Component Value Date   ALT 22 09/27/2021   AST 24 09/27/2021   ALKPHOS 45 09/27/2021   BILITOT 0.4 09/27/2021   Lab Results  Component Value Date   HGBA1C 5.7 (H) 01/29/2022   HGBA1C 5.7 (H) 09/27/2021   HGBA1C 5.7 (H) 02/07/2021   HGBA1C 5.6 06/29/2020   HGBA1C 5.4 10/19/2019   Lab Results  Component Value Date   INSULIN 18.4 09/27/2021   INSULIN 17.5 02/07/2021   INSULIN 17.7 06/29/2020   INSULIN 22.6 10/19/2019   INSULIN 16.2 05/27/2019   Lab Results  Component Value Date   TSH 1.199 05/14/2021   Lab Results  Component Value Date   CHOL 149 09/27/2021   HDL 60 09/27/2021   LDLCALC 74 09/27/2021   TRIG 80 09/27/2021   CHOLHDL 2.5 09/27/2021   Lab Results  Component Value Date   VD25OH 50.7 01/29/2022   VD25OH 47.7 09/27/2021   VD25OH 55.9 02/07/2021   Lab Results  Component Value Date   WBC 9.3 05/16/2021   HGB 11.5 (L) 05/16/2021   HCT 34.5 (L) 05/16/2021   MCV 91.5 05/16/2021   PLT 148 (L) 05/16/2021   No results found for: "IRON", "TIBC", "FERRITIN"  Attestation Statements:   Reviewed by clinician on day of visit: allergies, medications, problem list, medical history, surgical history, family history, social history, and previous encounter notes.  Time spent on visit including pre-visit  chart review and post-visit care and charting was 30 minutes.   I, Burt Knack, am acting as transcriptionist for Quillian Quince, MD.  I have reviewed the above documentation for accuracy and completeness, and I agree with the above. -  Quillian Quince, MD

## 2022-08-28 ENCOUNTER — Other Ambulatory Visit: Payer: Self-pay

## 2022-08-29 ENCOUNTER — Encounter: Payer: Medicare Other | Admitting: Physical Therapy

## 2022-08-29 ENCOUNTER — Other Ambulatory Visit: Payer: Self-pay

## 2022-08-29 ENCOUNTER — Ambulatory Visit: Payer: Medicare Other | Admitting: Physical Medicine and Rehabilitation

## 2022-08-29 VITALS — BP 118/71 | HR 74

## 2022-08-29 DIAGNOSIS — M47816 Spondylosis without myelopathy or radiculopathy, lumbar region: Secondary | ICD-10-CM

## 2022-08-29 MED ORDER — METHYLPREDNISOLONE ACETATE 80 MG/ML IJ SUSP
80.0000 mg | Freq: Once | INTRAMUSCULAR | Status: AC
Start: 2022-08-29 — End: 2022-08-29
  Administered 2022-08-29: 80 mg

## 2022-08-29 NOTE — Patient Instructions (Signed)

## 2022-08-29 NOTE — Progress Notes (Signed)
Functional Pain Scale - descriptive words and definitions  Uncomfortable (3)  Pain is present but can complete all ADL's/sleep is slightly affected and passive distraction only gives marginal relief. Mild range order  Average Pain  varies from 2-8   +Driver, -BT, -Dye Allergies.  Lower back pain on right side with no radiation. No pain when sitting or lying down

## 2022-09-01 NOTE — Progress Notes (Signed)
Gineen Effinger - 77 y.o. female MRN [id number]  Date of birth: [birth year]  Office Visit Note: Visit Date: 08/29/2022 PCP: Melida Quitter, MD Referred by: Melida Quitter, MD  Subjective: Chief Complaint  Patient presents with   Lower Back - Pain   HPI:  Caragh Walstrom is a 77 y.o. female who comes in todayfor planned radiofrequency ablation of the Right L4-5 Lumbar facet joints. This would be ablation of the corresponding medial branches and/or dorsal rami.  Patient has had double diagnostic blocks with more than 50% relief.  These are documented on pain diary.  They have had chronic back pain for quite some time, more than 3 months, which has been an ongoing situation with recalcitrant axial back pain.  They have no radicular pain.  Their axial pain is worse with standing and ambulating and on exam today with facet loading.  They have had physical therapy as well as home exercise program.  The imaging noted in the chart below indicated facet pathology. Accordingly they meet all the criteria and qualification for for radiofrequency ablation and we are going to complete this today hopefully for more longer term relief as part of comprehensive management program.   ROS Otherwise per HPI.  Assessment & Plan: Visit Diagnoses:    ICD-10-CM   1. Spondylosis without myelopathy or radiculopathy, lumbar region  M47.816 XR C-ARM NO REPORT    Radiofrequency,Lumbar    methylPREDNISolone acetate (DEPO-MEDROL) injection 80 mg      Plan: No additional findings.   Meds & Orders:  Meds ordered this encounter  Medications   methylPREDNISolone acetate (DEPO-MEDROL) injection 80 mg    Orders Placed This Encounter  Procedures   Radiofrequency,Lumbar   XR C-ARM NO REPORT    Follow-up: Return for visit to requesting provider as needed.   Procedures: No procedures performed  Lumbar Facet Joint Nerve Denervation  Patient: Marchell Munroe      Date of Birth: May 12, 1945 MRN: [id number] PCP: Melida Quitter, MD      Visit Date: 08/29/2022   Universal Protocol:    Date/Time: 08/31/2409:11 AM  Consent Given By: the patient  Position: PRONE  Additional Comments: Vital signs were monitored before and after the procedure. Patient was prepped and draped in the usual sterile fashion. The correct patient, procedure, and site was verified.   Injection Procedure Details:   Procedure diagnoses:  1. Spondylosis without myelopathy or radiculopathy, lumbar region      Meds Administered:  Meds ordered this encounter  Medications   methylPREDNISolone acetate (DEPO-MEDROL) injection 80 mg     Laterality: Right  Location/Site:  L4-L5, L3 and L4 medial branches  Needle: 18 ga.,  10mm active tip, RF Cannula  Needle Placement: Along juncture of superior articular process and transverse pocess  Findings:  -Comments:  Procedure Details: For each desired target nerve, the corresponding transverse process (sacral ala for the L5 dorsal rami) was identified and the fluoroscope was positioned to square off the endplates of the corresponding vertebral body to achieve a true AP midline view.  The beam was then obliqued 15 to 20 degrees and caudally tilted 15 to 20 degrees to line up a trajectory along the target nerves. The skin over the target of the junction of superior articulating process and transverse process (sacral ala for the L5 dorsal rami) was infiltrated with 1ml of 1% Lidocaine without Epinephrine.  The 18 gauge 10mm active tip outer cannula was advanced in trajectory view to the target.  This procedure was repeated for each target nerve.  Then, for all levels, the outer cannula placement was fine-tuned and the position was then confirmed with bi-planar imaging.    Test stimulation was done both at sensory and motor levels to ensure there was no radicular stimulation. The target tissues were then infiltrated with 1 ml of 1% Lidocaine without Epinephrine. Subsequently, a percutaneous  neurotomy was carried out for 90 seconds at 80 degrees Celsius.  After the completion of the lesion, 1 ml of injectate was delivered. It was then repeated for each facet joint nerve mentioned above. Appropriate radiographs were obtained to verify the probe placement during the neurotomy.   Additional Comments:  No complications occurred Dressing: 2 x 2 sterile gauze and Band-Aid    Post-procedure details: Patient was observed during the procedure. Post-procedure instructions were reviewed.  Patient left the clinic in stable condition.      Clinical History: EXAM: MRI LUMBAR SPINE WITHOUT CONTRAST   TECHNIQUE: Multiplanar, multisequence MR imaging of the lumbar spine was performed. No intravenous contrast was administered.   COMPARISON:  Lumbar spine radiographs 02/05/2022, lumbar spine MRI 10/10/2018   FINDINGS: Segmentation: Standard; the lowest formed disc space is designated L5-S1.   Alignment: There is dextrocurvature centered at L2-L3 which appears increased since 2020. Trace retrolisthesis of T12 on L1 and trace anterolisthesis of L4 on L5 are unchanged.   Vertebrae: Vertebral body heights are preserved. Background marrow signal is normal. There is no suspicious marrow signal abnormality or marrow edema.   Conus medullaris and cauda equina: Conus extends to the L1 level. Conus and cauda equina appear normal. An incidental conjoined right L5-S1 nerve root is noted. 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   Paraspinal and other soft tissues: Unremarkable.   Disc levels:   There is mild disc desiccation and narrowing at T12-L1. There is otherwise overall mild desiccation without significant loss of height.   T12-L1: There is a mild disc bulge without significant spinal canal or neural foraminal stenosis, unchanged.   L1-L2: No significant spinal canal or neural foraminal stenosis   L2-L3: There is a mild disc bulge which is minimally increased since 2020 but  without significant spinal canal or neural foraminal stenosis   L3-L4: There is a mild disc bulge with a superimposed right foraminal/extraforaminal protrusion which comes in close proximity to the exiting L3 nerve root but without significant spinal canal or neural foraminal stenosis, unchanged.   L4-L5: There is trace anterolisthesis, and advanced left worse than right facet arthropathy with effusions resulting in mild left subarticular zone narrowing and no significant neural foraminal stenosis. The left subarticular zone narrowing is slightly worsened since 2020.   L5-S1: There is moderate bilateral facet arthropathy without significant spinal canal or neural foraminal stenosis, not significantly changed.   IMPRESSION: 1. 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 2. Moderate facet arthropathy at L5-S1 without significant spinal canal or neural foraminal stenosis, not significantly changed. 3. Right foraminal/extraforaminal protrusion at L3-L4 which comes in close proximity to the exiting L3 nerve root, unchanged. 4. Dextrocurvature centered at L2-L3 appears increased since 2020.     Electronically Signed   By: Lesia Hausen M.D.   On: 05/29/2022 17:08     Objective:  VS:  HT:    WT:   BMI:     BP:118/71  HR:74bpm  TEMP: ( )  RESP:  Physical Exam Vitals and nursing note reviewed.  Constitutional:      General: She is not in acute distress.    Appearance: Normal appearance. She is not ill-appearing.  HENT:     Head: Normocephalic and atraumatic.     Right Ear: External ear normal.     Left Ear: External ear normal.  Eyes:     Extraocular Movements: Extraocular movements intact.  Cardiovascular:     Rate and Rhythm: Normal rate.     Pulses: Normal pulses.  Pulmonary:     Effort: Pulmonary effort is  normal. No respiratory distress.  Abdominal:     General: There is no distension.     Palpations: Abdomen is soft.  Musculoskeletal:        General: Tenderness present.     Cervical back: Neck supple.     Right lower leg: No edema.     Left lower leg: No edema.     Comments: Patient has good distal strength with no pain over the greater trochanters.  No clonus or focal weakness.  Skin:    Findings: No erythema, lesion or rash.  Neurological:     General: No focal deficit present.     Mental Status: She is alert and oriented to person, place, and time.     Sensory: No sensory deficit.     Motor: No weakness or abnormal muscle tone.     Coordination: Coordination normal.  Psychiatric:        Mood and Affect: Mood normal.        Behavior: Behavior normal.      Imaging: No results found.

## 2022-09-01 NOTE — Procedures (Signed)
Lumbar Facet Joint Nerve Denervation  [name]      Date of Birth: 1945-10-12 MRN: [id number] PCP: Melida Quitter, MD      Visit Date: 08/29/2022   Universal Protocol:    Date/Time: 08/31/2409:11 AM  Consent Given By: the patient  Position: PRONE  Additional Comments: Vital signs were monitored before and after the procedure. Patient was prepped and draped in the usual sterile fashion. The correct patient, procedure, and site was verified.   Injection Procedure Details:   Procedure diagnoses:  1. Spondylosis without myelopathy or radiculopathy, lumbar region      Meds Administered:  Meds ordered this encounter  Medications   methylPREDNISolone acetate (DEPO-MEDROL) injection 80 mg     Laterality: Right  Location/Site:  L4-L5, L3 and L4 medial branches  Needle: 18 ga.,  10mm active tip, RF Cannula  Needle Placement: Along juncture of superior articular process and transverse pocess  Findings:  -Comments:  Procedure Details: For each desired target nerve, the corresponding transverse process (sacral ala for the L5 dorsal rami) was identified and the fluoroscope was positioned to square off the endplates of the corresponding vertebral body to achieve a true AP midline view.  The beam was then obliqued 15 to 20 degrees and caudally tilted 15 to 20 degrees to line up a trajectory along the target nerves. The skin over the target of the junction of superior articulating process and transverse process (sacral ala for the L5 dorsal rami) was infiltrated with 1ml of 1% Lidocaine without Epinephrine.  The 18 gauge 10mm active tip outer cannula was advanced in trajectory view to the target.  This procedure was repeated for each target nerve.  Then, for all levels, the outer cannula placement was fine-tuned and the position was then confirmed with bi-planar imaging.    Test stimulation was done both at sensory and motor levels to ensure there was no radicular  stimulation. The target tissues were then infiltrated with 1 ml of 1% Lidocaine without Epinephrine. Subsequently, a percutaneous neurotomy was carried out for 90 seconds at 80 degrees Celsius.  After the completion of the lesion, 1 ml of injectate was delivered. It was then repeated for each facet joint nerve mentioned above. Appropriate radiographs were obtained to verify the probe placement during the neurotomy.   Additional Comments:  No complications occurred Dressing: 2 x 2 sterile gauze and Band-Aid    Post-procedure details: Patient was observed during the procedure. Post-procedure instructions were reviewed.  Patient left the clinic in stable condition.

## 2022-09-04 ENCOUNTER — Encounter: Payer: Self-pay | Admitting: Pulmonary Disease

## 2022-09-04 DIAGNOSIS — G4733 Obstructive sleep apnea (adult) (pediatric): Secondary | ICD-10-CM

## 2022-09-04 NOTE — Patient Instructions (Signed)
DUE TO COVID-19 ONLY TWO VISITORS  (aged 76 and older)  ARE ALLOWED TO COME WITH YOU AND STAY IN THE WAITING ROOM ONLY DURING PRE OP AND PROCEDURE.   **NO VISITORS ARE ALLOWED IN THE SHORT STAY AREA OR RECOVERY ROOM!!**  IF YOU WILL BE ADMITTED INTO THE HOSPITAL YOU ARE ALLOWED ONLY FOUR SUPPORT PEOPLE DURING VISITATION HOURS ONLY (7 AM -8PM)   The support person(s) must pass our screening, gel in and out, and wear a mask at all times, including in the patient's room. Patients must also wear a mask when staff or their support person are in the room. Visitors GUEST BADGE MUST BE WORN VISIBLY  One adult visitor may remain with you overnight and MUST be in the room by 8 P.M.     Your procedure is scheduled on: 09/14/22   Report to [facility] Main Entrance    Report to admitting at : 10:15 AM   Call this number if you have problems the morning of surgery [phone]   Do not eat food :After Midnight.   After Midnight you may have the following liquids until : 9:45 AM DAY OF SURGERY  Water Black Coffee (sugar ok, NO MILK/CREAM OR CREAMERS)  Tea (sugar ok, NO MILK/CREAM OR CREAMERS) regular and decaf                             Plain Jell-O (NO RED)                                           Fruit ices (not with fruit pulp, NO RED)                                     Popsicles (NO RED)                                                                  Juice: apple, WHITE grape, WHITE cranberry Sports drinks like Gatorade (NO RED)   The day of surgery:  Drink ONE (1) Pre-Surgery Clear G2 at : 9:45 AM the morning of surgery. Drink in one sitting. Do not sip.  This drink was given to you during your hospital  pre-op appointment visit. Nothing else to drink after completing the  Pre-Surgery Clear Ensure or G2.          If you have questions, please contact your surgeon's office.  FOLLOW ANY ADDITIONAL PRE OP INSTRUCTIONS YOU RECEIVED FROM YOUR SURGEON'S OFFICE!!!   Oral Hygiene  is also important to reduce your risk of infection.                                    Remember - BRUSH YOUR TEETH THE MORNING OF SURGERY WITH YOUR REGULAR TOOTHPASTE  DENTURES WILL BE REMOVED PRIOR TO SURGERY PLEASE DO NOT APPLY "Poly grip" OR ADHESIVES!!!   Do NOT smoke after Midnight   Take these medicines the morning of surgery  with A SIP OF WATER: metoprolol,gabapentin,duloxetine,cetirizine,pantoprazole.Use inhalers as usual.  DO NOT TAKE ANY ORAL DIABETIC MEDICATIONS DAY OF YOUR SURGERY  Bring CPAP mask and tubing day of surgery.                              You may not have any metal on your body including hair pins, jewelry, and body piercing             Do not wear make-up, lotions, powders, perfumes/cologne, or deodorant  Do not wear nail polish including gel and S&S, artificial/acrylic nails, or any other type of covering on natural nails including finger and toenails. If you have artificial nails, gel coating, etc. that needs to be removed by a nail salon please have this removed prior to surgery or surgery may need to be canceled/ delayed if the surgeon/ anesthesia feels like they are unable to be safely monitored.   Do not shave  48 hours prior to surgery.    Do not bring valuables to the hospital. [facility] IS NOT             RESPONSIBLE   FOR VALUABLES.   Contacts, glasses, or bridgework may not be worn into surgery.   Bring small overnight bag day of surgery.   DO NOT BRING YOUR HOME MEDICATIONS TO THE HOSPITAL. PHARMACY WILL DISPENSE MEDICATIONS LISTED ON YOUR MEDICATION LIST TO YOU DURING YOUR ADMISSION IN THE HOSPITAL!    Patients discharged on the day of surgery will not be allowed to drive home.  Someone NEEDS to stay with you for the first 24 hours after anesthesia.   Special Instructions: Bring a copy of your healthcare power of attorney and living will documents         the day of surgery if you haven't scanned them before.              Please read over  the following fact sheets you were given: IF YOU HAVE QUESTIONS ABOUT YOUR PRE-OP INSTRUCTIONS PLEASE CALL [phone]      Pre-operative 5 CHG Bath Instructions   You can play a key role in reducing the risk of infection after surgery. Your skin needs to be as free of germs as possible. You can reduce the number of germs on your skin by washing with CHG (chlorhexidine gluconate) soap before surgery. CHG is an antiseptic soap that kills germs and continues to kill germs even after washing.   DO NOT use if you have an allergy to chlorhexidine/CHG or antibacterial soaps. If your skin becomes reddened or irritated, stop using the CHG and notify one of our RNs at : [phone].   Please shower with the CHG soap starting 4 days before surgery using the following schedule:     Please keep in mind the following:  DO NOT shave, including legs and underarms, starting the day of your first shower.   You may shave your face at any point before/day of surgery.  Place clean sheets on your bed the day you start using CHG soap. Use a clean washcloth (not used since being washed) for each shower. DO NOT sleep with pets once you start using the CHG.   CHG Shower Instructions:  If you choose to wash your hair and private area, wash first with your normal shampoo/soap.  After you use shampoo/soap, rinse your hair and body thoroughly to remove shampoo/soap residue.  Turn the water  OFF and apply about 3 tablespoons (45 ml) of CHG soap to a CLEAN washcloth.  Apply CHG soap ONLY FROM YOUR NECK DOWN TO YOUR TOES (washing for 3-5 minutes)  DO NOT use CHG soap on face, private areas, open wounds, or sores.  Pay special attention to the area where your surgery is being performed.  If you are having back surgery, having someone wash your back for you may be helpful. Wait 2 minutes after CHG soap is applied, then you may rinse off the CHG soap.  Pat dry with a clean towel  Put on clean clothes/pajamas   If you  choose to wear lotion, please use ONLY the CHG-compatible lotions on the back of this paper.     Additional instructions for the day of surgery: DO NOT APPLY any lotions, deodorants, cologne, or perfumes.   Put on clean/comfortable clothes.  Brush your teeth.  Ask your nurse before applying any prescription medications to the skin.   CHG Compatible Lotions   Aveeno Moisturizing lotion  Cetaphil Moisturizing Cream  Cetaphil Moisturizing Lotion  Clairol Herbal Essence Moisturizing Lotion, Dry Skin  Clairol Herbal Essence Moisturizing Lotion, Extra Dry Skin  Clairol Herbal Essence Moisturizing Lotion, Normal Skin  Curel Age Defying Therapeutic Moisturizing Lotion with Alpha Hydroxy  Curel Extreme Care Body Lotion  Curel Soothing Hands Moisturizing Hand Lotion  Curel Therapeutic Moisturizing Cream, Fragrance-Free  Curel Therapeutic Moisturizing Lotion, Fragrance-Free  Curel Therapeutic Moisturizing Lotion, Original Formula  Eucerin Daily Replenishing Lotion  Eucerin Dry Skin Therapy Plus Alpha Hydroxy Crme  Eucerin Dry Skin Therapy Plus Alpha Hydroxy Lotion  Eucerin Original Crme  Eucerin Original Lotion  Eucerin Plus Crme Eucerin Plus Lotion  Eucerin TriLipid Replenishing Lotion  Keri Anti-Bacterial Hand Lotion  Keri Deep Conditioning Original Lotion Dry Skin Formula Softly Scented  Keri Deep Conditioning Original Lotion, Fragrance Free Sensitive Skin Formula  Keri Lotion Fast Absorbing Fragrance Free Sensitive Skin Formula  Keri Lotion Fast Absorbing Softly Scented Dry Skin Formula  Keri Original Lotion  Keri Skin Renewal Lotion Keri Silky Smooth Lotion  Keri Silky Smooth Sensitive Skin Lotion  Nivea Body Creamy Conditioning Oil  Nivea Body Extra Enriched Lotion  Nivea Body Original Lotion  Nivea Body Sheer Moisturizing Lotion Nivea Crme  Nivea Skin Firming Lotion  NutraDerm 30 Skin Lotion  NutraDerm Skin Lotion  NutraDerm Therapeutic Skin Cream  NutraDerm Therapeutic  Skin Lotion  ProShield Protective Hand Cream  Provon moisturizing lotion   Incentive Spirometer  An incentive spirometer is a tool that can help keep your lungs clear and active. This tool measures how well you are filling your lungs with each breath. Taking long deep breaths may help reverse or decrease the chance of developing breathing (pulmonary) problems (especially infection) following: A long period of time when you are unable to move or be active. BEFORE THE PROCEDURE  If the spirometer includes an indicator to show your best effort, your nurse or respiratory therapist will set it to a desired goal. If possible, sit up straight or lean slightly forward. Try not to slouch. Hold the incentive spirometer in an upright position. INSTRUCTIONS FOR USE  Sit on the edge of your bed if possible, or sit up as far as you can in bed or on a chair. Hold the incentive spirometer in an upright position. Breathe out normally. Place the mouthpiece in your mouth and seal your lips tightly around it. Breathe in slowly and as deeply as possible, raising the piston or the ball toward  the top of the column. Hold your breath for 3-5 seconds or for as long as possible. Allow the piston or ball to fall to the bottom of the column. Remove the mouthpiece from your mouth and breathe out normally. Rest for a few seconds and repeat Steps 1 through 7 at least 10 times every 1-2 hours when you are awake. Take your time and take a few normal breaths between deep breaths. The spirometer may include an indicator to show your best effort. Use the indicator as a goal to work toward during each repetition. After each set of 10 deep breaths, practice coughing to be sure your lungs are clear. If you have an incision (the cut made at the time of surgery), support your incision when coughing by placing a pillow or rolled up towels firmly against it. Once you are able to get out of bed, walk around indoors and cough well. You  may stop using the incentive spirometer when instructed by your caregiver.  RISKS AND COMPLICATIONS Take your time so you do not get dizzy or light-headed. If you are in pain, you may need to take or ask for pain medication before doing incentive spirometry. It is harder to take a deep breath if you are having pain. AFTER USE Rest and breathe slowly and easily. It can be helpful to keep track of a log of your progress. Your caregiver can provide you with a simple table to help with this. If you are using the spirometer at home, follow these instructions: SEEK MEDICAL CARE IF:  You are having difficultly using the spirometer. You have trouble using the spirometer as often as instructed. Your pain medication is not giving enough relief while using the spirometer. You develop fever of 100.5 F (38.1 C) or higher. SEEK IMMEDIATE MEDICAL CARE IF:  You cough up bloody sputum that had not been present before. You develop fever of 102 F (38.9 C) or greater. You develop worsening pain at or near the incision site. MAKE SURE YOU:  Understand these instructions. Will watch your condition. Will get help right away if you are not doing well or get worse. Document Released: 04/30/2006 Document Revised: 03/12/2011 Document Reviewed: 07/01/2006 [facility] Patient Information 2014 [place], [place].   ________________________________________________________________________

## 2022-09-05 ENCOUNTER — Encounter (HOSPITAL_COMMUNITY)
Admission: RE | Admit: 2022-09-05 | Discharge: 2022-09-05 | Disposition: A | Discharge status: Home / Self Care | Payer: Medicare Other | Source: Ambulatory Visit | Attending: Orthopaedic Surgery | Admitting: Orthopaedic Surgery

## 2022-09-05 ENCOUNTER — Other Ambulatory Visit: Payer: Self-pay

## 2022-09-05 ENCOUNTER — Encounter (HOSPITAL_COMMUNITY): Payer: Self-pay

## 2022-09-05 VITALS — BP 150/66 | HR 66 | Temp 97.7°F | Ht 64.0 in | Wt 222.0 lb

## 2022-09-05 DIAGNOSIS — M1711 Unilateral primary osteoarthritis, right knee: Secondary | ICD-10-CM | POA: Diagnosis not present

## 2022-09-05 DIAGNOSIS — G4733 Obstructive sleep apnea (adult) (pediatric): Secondary | ICD-10-CM | POA: Insufficient documentation

## 2022-09-05 DIAGNOSIS — N189 Chronic kidney disease, unspecified: Secondary | ICD-10-CM | POA: Diagnosis not present

## 2022-09-05 DIAGNOSIS — I129 Hypertensive chronic kidney disease with stage 1 through stage 4 chronic kidney disease, or unspecified chronic kidney disease: Secondary | ICD-10-CM | POA: Diagnosis not present

## 2022-09-05 DIAGNOSIS — Z01818 Encounter for other preprocedural examination: Secondary | ICD-10-CM | POA: Insufficient documentation

## 2022-09-05 DIAGNOSIS — E669 Obesity, unspecified: Secondary | ICD-10-CM | POA: Diagnosis not present

## 2022-09-05 DIAGNOSIS — K219 Gastro-esophageal reflux disease without esophagitis: Secondary | ICD-10-CM | POA: Insufficient documentation

## 2022-09-05 DIAGNOSIS — E1122 Type 2 diabetes mellitus with diabetic chronic kidney disease: Secondary | ICD-10-CM | POA: Insufficient documentation

## 2022-09-05 DIAGNOSIS — J45909 Unspecified asthma, uncomplicated: Secondary | ICD-10-CM | POA: Diagnosis not present

## 2022-09-05 DIAGNOSIS — I10 Essential (primary) hypertension: Secondary | ICD-10-CM

## 2022-09-05 DIAGNOSIS — E119 Type 2 diabetes mellitus without complications: Secondary | ICD-10-CM

## 2022-09-05 HISTORY — DX: Type 2 diabetes mellitus without complications: E11.9

## 2022-09-05 LAB — CBC
HCT: 37 % (ref 36.0–46.0)
Hemoglobin: 12.1 g/dL (ref 12.0–15.0)
MCH: 30.6 pg (ref 26.0–34.0)
MCHC: 32.7 g/dL (ref 30.0–36.0)
MCV: 93.4 fL (ref 80.0–100.0)
Platelets: 230 10*3/uL (ref 150–400)
RBC: 3.96 MIL/uL (ref 3.87–5.11)
RDW: 12.6 % (ref 11.5–15.5)
WBC: 9.4 10*3/uL (ref 4.0–10.5)
nRBC: 0 % (ref 0.0–0.2)

## 2022-09-05 LAB — COMPREHENSIVE METABOLIC PANEL
ALT: 20 U/L (ref 0–44)
AST: 21 U/L (ref 15–41)
Albumin: 4 g/dL (ref 3.5–5.0)
Alkaline Phosphatase: 38 U/L (ref 38–126)
Anion gap: 9 (ref 5–15)
BUN: 29 mg/dL — ABNORMAL HIGH (ref 8–23)
CO2: 26 mmol/L (ref 22–32)
Calcium: 9.2 mg/dL (ref 8.9–10.3)
Chloride: 98 mmol/L (ref 98–111)
Creatinine, Ser: 1.1 mg/dL — ABNORMAL HIGH (ref 0.44–1.00)
GFR, Estimated: 52 mL/min — ABNORMAL LOW (ref >60)
Glucose, Bld: 89 mg/dL (ref 70–99)
Potassium: 4.7 mmol/L (ref 3.5–5.1)
Sodium: 133 mmol/L — ABNORMAL LOW (ref 135–145)
Total Bilirubin: 0.5 mg/dL (ref 0.3–1.2)
Total Protein: 7.1 g/dL (ref 6.5–8.1)

## 2022-09-05 LAB — SURGICAL PCR SCREEN
MRSA, PCR: NEGATIVE
Staphylococcus aureus: NEGATIVE

## 2022-09-05 LAB — HEMOGLOBIN A1C
Hgb A1c MFr Bld: 5.5 % (ref 4.8–5.6)
Mean Plasma Glucose: 111.15 mg/dL

## 2022-09-05 LAB — GLUCOSE, CAPILLARY: Glucose-Capillary: 118 mg/dL — ABNORMAL HIGH (ref 70–99)

## 2022-09-05 NOTE — Progress Notes (Signed)
For Short Stay: COVID SWAB appointment date:  Bowel Prep reminder:   For Anesthesia: PCP - Melida Quitter, MD  Cardiologist - Nahser, [name] Ping, MD . Theron Arista: 02/1921  Chest x-ray -  EKG - 09/05/22 Stress Test -  ECHO - 09/06/21 Cardiac Cath -  Pacemaker/ICD device last checked: Pacemaker orders received: Device Rep notified:  Spinal Cord Stimulator: N/A  Sleep Study - Yes CPAP - Yes  Fasting Blood Sugar - N/A Checks Blood Sugar ___0__ times a day Date and result of last Hgb A1c- 5.7: 01/29/22  Last dose of GLP1 agonist- N/A GLP1 instructions:   Last dose of SGLT-2 inhibitors- N/A SGLT-2 instructions:   Blood Thinner Instructions: N/A Aspirin Instructions: Last Dose:  Activity level: Can go up a flight of stairs and activities of daily living without stopping and without chest pain and/or shortness of breath   unble to exercise without shortness of breath  Anesthesia review: Hx: HTN,Palpitations,SOB,OSA(CPAP)  Patient denies shortness of breath, fever, cough and chest pain at PAT appointment   Patient verbalized understanding of instructions that were given to them at the PAT appointment. Patient was also instructed that they will need to review over the PAT instructions again at home before surgery.

## 2022-09-05 NOTE — Telephone Encounter (Signed)
Yes. Please send in order for CPAP

## 2022-09-06 ENCOUNTER — Encounter (HOSPITAL_COMMUNITY): Payer: Self-pay

## 2022-09-06 ENCOUNTER — Other Ambulatory Visit (INDEPENDENT_AMBULATORY_CARE_PROVIDER_SITE_OTHER): Payer: Self-pay | Admitting: Family Medicine

## 2022-09-06 DIAGNOSIS — R7303 Prediabetes: Secondary | ICD-10-CM

## 2022-09-06 NOTE — Anesthesia Preprocedure Evaluation (Addendum)
Anesthesia Evaluation  Patient identified by MRN, date of birth, ID band Patient awake    Reviewed: Allergy & Precautions, NPO status , Patient's Chart, lab work & pertinent test results  Airway Mallampati: II  TM Distance: >3 FB Neck ROM: Full    Dental no notable dental hx. (+) Teeth Intact, Dental Advisory Given   Pulmonary asthma , sleep apnea and Continuous Positive Airway Pressure Ventilation    Pulmonary exam normal breath sounds clear to auscultation       Cardiovascular hypertension, Normal cardiovascular exam Rhythm:Regular Rate:Normal  Echo 09/06/2021:  1. Left ventricular ejection fraction, by estimation, is 60 to 65%. The  left ventricle has normal function. The left ventricle has no regional  wall motion abnormalities. 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 Left ventricular  diastolic parameters are indeterminate. Elevated left ventricular  end-diastolic pressure.   2. Right ventricular systolic function is normal. The right ventricular  size is normal. There is normal pulmonary artery systolic pressure. The  estimated right ventricular systolic pressure is 18.4 mmHg.   3. The mitral valve is normal in structure. Trivial mitral valve  regurgitation. No evidence of mitral stenosis.   4. The aortic valve is normal in structure. Aortic valve regurgitation is  not visualized. No aortic stenosis is present.   5. The inferior vena cava is normal in size with greater than 50%     Neuro/Psych    GI/Hepatic ,GERD  ,,  Endo/Other  diabetes    Renal/GU Renal InsufficiencyRenal diseaseLab Results      Component                Value               Date                            K                        4.7                 09/05/2022                       CREATININE               1.10 (H)            09/05/2022                     Musculoskeletal  (+) Arthritis , Osteoarthritis,  Chronic back pain    Abdominal   Peds  Hematology Lab Results      Component                Value               Date                      WBC                      9.4                 09/05/2022                HGB                      12.1  09/05/2022                HCT                      37.0                09/05/2022                MCV                      93.4                09/05/2022                PLT                      230                 09/05/2022              Anesthesia Other Findings   Reproductive/Obstetrics                             Anesthesia Physical Anesthesia Plan  ASA: 3  Anesthesia Plan: Spinal and Regional   Post-op Pain Management: Minimal or no pain anticipated, Regional block* and Tylenol PO (pre-op)*   Induction: Intravenous  PONV Risk Score and Plan: 3 and Treatment may vary due to age or medical condition, Ondansetron and Propofol infusion  Airway Management Planned: Natural Airway and Nasal Cannula  Additional Equipment: None  Intra-op Plan:   Post-operative Plan:   Informed Consent: I have reviewed the patients History and Physical, chart, labs and discussed the procedure including the risks, benefits and alternatives for the proposed anesthesia with the patient or authorized representative who has indicated his/her understanding and acceptance.     Dental advisory given  Plan Discussed with: CRNA  Anesthesia Plan Comments: (See PAT note from 9/4 by Sherlie Ban PA-C  Spinal + R adductor canal block )        Anesthesia Quick Evaluation

## 2022-09-06 NOTE — Progress Notes (Addendum)
Case: [id number] Date/Time: 09/14/22 1230   Procedure: RIGHT TOTAL KNEE ARTHROPLASTY (Right: Knee)   Anesthesia type: Spinal   Pre-op diagnosis: osteoarthritis right knee   Location: [facility] ROOM 09 / [facility]   Surgeons: Kathryne Hitch, MD       DISCUSSION: Crystal Jackson is a 77 yo female who presents to PAT prior to surgery above. PMH significant for HTN, CKD, orthostasis, asthma, anxiety, OSA, obesity, GERD, diabetes, arthritis, chronic back pain.  Follows with Cardiology for orthostasis symptoms/near syncope. Last seen on 02/19/22. She reported symptoms were improved. Previously obtained echo in 09/2021 to r/o valvular cause which was normal.  Follows with her PCP for chronic medical issues. Last seen on 9/5. Diabetes is well controlled. She was cleared from medical/cardiac standpoint (paper copy in chart)  VS: BP (!) 150/66   Pulse 66   Temp 36.5 C (Oral)   Ht 5\' 4"  (1.626 m)   Wt 100.7 kg   SpO2 95%   BMI 38.11 kg/m   PROVIDERS: Melida Quitter, MD Cardiology: Kristeen Miss, MD  LABS: Labs reviewed: Acceptable for surgery. (all labs ordered are listed, but only abnormal results are displayed)  Labs Reviewed  COMPREHENSIVE METABOLIC PANEL - Abnormal; Notable for the following components:      Result Value   Sodium 133 (*)    BUN 29 (*)    Creatinine, Ser 1.10 (*)    GFR, Estimated 52 (*)    All other components within normal limits  GLUCOSE, CAPILLARY - Abnormal; Notable for the following components:   Glucose-Capillary 118 (*)    All other components within normal limits  SURGICAL PCR SCREEN  HEMOGLOBIN A1C  CBC     IMAGES:  MRI lumbar spine 05/22/22:    IMPRESSION: 1. 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 2. Moderate facet arthropathy at L5-S1 without significant spinal canal or neural  foraminal stenosis, not significantly changed. 3. Right foraminal/extraforaminal protrusion at L3-L4 which comes in close proximity to the exiting L3 nerve root, unchanged. 4. Dextrocurvature centered at L2-L3 appears increased since 2020.   EKG 09/05/22:  NSR, rate 61   CV:  Echo 09/06/2021:  IMPRESSIONS     1. Left ventricular ejection fraction, by estimation, is 60 to 65%. The  left ventricle has normal function. The left ventricle has no regional  wall motion abnormalities. 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 Left ventricular  diastolic parameters are indeterminate. Elevated left ventricular  end-diastolic pressure.   2. Right ventricular systolic function is normal. The right ventricular  size is normal. There is normal pulmonary artery systolic pressure. The  estimated right ventricular systolic pressure is 18.4 mmHg.   3. The mitral valve is normal in structure. Trivial mitral valve  regurgitation. No evidence of mitral stenosis.   4. The aortic valve is normal in structure. Aortic valve regurgitation is  not visualized. No aortic stenosis is present.   5. The inferior vena cava is normal in size with greater than 50%  respiratory variability, suggesting right atrial pressure of 3 mmHg.   Past Medical History:  Diagnosis Date   Anxiety    Arthritis    Asthma    Back pain    Bilateral swelling of feet    Constipation    Depression    Diabetes mellitus without complication (HCC)    Gallbladder problem  GERD (gastroesophageal reflux disease)    History of low potassium    Hypertension    Joint pain    Obesity    Palpitations    Shortness of breath    Sleep apnea     Past Surgical History:  Procedure Laterality Date   ABDOMINAL HYSTERECTOMY     APPENDECTOMY     CATARACT EXTRACTION, BILATERAL     CHOLECYSTECTOMY     KNEE SURGERY Left    arthroscopy   SHOULDER SURGERY Right     MEDICATIONS:  Xylitol 500 MG DISK   albuterol  (VENTOLIN HFA) 108 (90 Base) MCG/ACT inhaler   b complex vitamins capsule   baclofen (LIORESAL) 10 MG tablet   Calcium Carbonate-Vitamin D 600-400 MG-UNIT tablet   cetirizine (ZYRTEC) 10 MG tablet   chlorpheniramine (CHLOR-TRIMETON) 4 MG tablet   clotrimazole-betamethasone (LOTRISONE) cream   diazepam (VALIUM) 5 MG tablet   diclofenac Sodium (VOLTAREN) 1 % GEL   DULoxetine (CYMBALTA) 30 MG capsule   fluticasone (FLONASE) 50 MCG/ACT nasal spray   Fluticasone Furoate (ARNUITY ELLIPTA) 200 MCG/ACT AEPB   gabapentin (NEURONTIN) 300 MG capsule   meloxicam (MOBIC) 7.5 MG tablet   metFORMIN (GLUCOPHAGE) 500 MG tablet   metoprolol tartrate (LOPRESSOR) 25 MG tablet   Multiple Vitamin (MULTIVITAMIN) tablet   NON FORMULARY   Omega-3 Fatty Acids (FISH OIL) 500 MG CAPS   pantoprazole (PROTONIX) 40 MG tablet   Polyethylene Glycol 400 (BLINK TEARS OP)   spironolactone (ALDACTONE) 25 MG tablet   traMADol (ULTRAM) 50 MG tablet   traZODone (DESYREL) 100 MG tablet   triamcinolone cream (KENALOG) 0.1 %   No current facility-administered medications for this encounter.   Marcille Blanco [facility]/Anesthesiology [facility] Phone [phone] 09/06/2022 11:20 AM

## 2022-09-13 ENCOUNTER — Encounter (HOSPITAL_COMMUNITY): Payer: Self-pay | Admitting: Orthopaedic Surgery

## 2022-09-13 NOTE — H&P (Signed)
TOTAL KNEE ADMISSION H&P  Patient is being admitted for right total knee arthroplasty.  Subjective:  Chief Complaint:right knee pain.  HPI: Crystal Jackson, 77 y.o. female, has a history of pain and functional disability in the right knee due to arthritis and has failed non-surgical conservative treatments for greater than 12 weeks to includeNSAID's and/or analgesics, corticosteriod injections, viscosupplementation injections, flexibility and strengthening excercises, use of assistive devices, weight reduction as appropriate, and activity modification.  Onset of symptoms was gradual, starting 5 years ago with gradually worsening course since that time. The patient noted no past surgery on the right knee(s).  Patient currently rates pain in the right knee(s) at 10 out of 10 with activity. Patient has night pain, worsening of pain with activity and weight bearing, pain that interferes with activities of daily living, pain with passive range of motion, crepitus, and joint swelling.  Patient has evidence of subchondral sclerosis, periarticular osteophytes, and joint space narrowing by imaging studies. There is no active infection.  Patient Active Problem List   Diagnosis Date Noted   Unilateral primary osteoarthritis, right knee 07/12/2022   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   BMI 40.0-44.9, adult (HCC)-current bmi 37.3 03/20/2022   Morbid obesity (HCC) 01/30/2022   BMI 37.0-37.9, adult 01/30/2022   B12 deficiency due to diet 09/27/2021   SOB (shortness of breath) on exertion 09/27/2021   Chronic vertigo 09/27/2021   Other hyperlipidemia 08/28/2021   Vitamin D deficiency 08/28/2021   Pre-diabetes 06/28/2021   Sepsis (HCC) 05/15/2021   Hypoxia 05/15/2021   Essential hypertension 05/15/2021   DM (diabetes mellitus), type 2 (HCC) 05/15/2021   Hypomagnesemia 05/15/2021   UTI (urinary tract infection) 05/14/2021   Acute exacerbation of chronic low back pain 03/10/2021   OSA on CPAP 09/03/2018    Mild persistent asthma without complication 09/03/2018   Class 3 severe obesity with serious comorbidity and body mass index (BMI) of 40.0 to 44.9 in adult (HCC) 04/14/2018   Unilateral primary osteoarthritis, left knee 04/14/2018   Asthma in adult, mild intermittent, uncomplicated 02/26/2017   Allergic rhinitis 09/05/2013   Past Medical History:  Diagnosis Date   Anxiety    Arthritis    Asthma    Back pain    Bilateral swelling of feet    Constipation    Depression    Diabetes mellitus without complication (HCC)    Gallbladder problem    GERD (gastroesophageal reflux disease)    History of low potassium    Hypertension    Joint pain    Obesity    Palpitations    Shortness of breath    Sleep apnea     Past Surgical History:  Procedure Laterality Date   ABDOMINAL HYSTERECTOMY     APPENDECTOMY     CATARACT EXTRACTION, BILATERAL     CHOLECYSTECTOMY     KNEE SURGERY Left    arthroscopy   SHOULDER SURGERY Right     No current facility-administered medications for this encounter.   Current Outpatient Medications  Medication Sig Dispense Refill Last Dose   albuterol (VENTOLIN HFA) 108 (90 Base) MCG/ACT inhaler Inhale 1-2 puffs into the lungs every 6 (six) hours as needed for wheezing or shortness of breath. 8 g 11    b complex vitamins capsule Take 1 capsule by mouth daily.      baclofen (LIORESAL) 10 MG tablet Take 1 tablet (10 mg total) by mouth 3 (three) times daily. (Patient taking differently: Take 10 mg by mouth 3 (three) times daily as needed  for muscle spasms.) 30 each 0    Calcium Carbonate-Vitamin D 600-400 MG-UNIT tablet 1 po qd 10 tablet 0    cetirizine (ZYRTEC) 10 MG tablet Take 10 mg by mouth daily.      clotrimazole-betamethasone (LOTRISONE) cream 1 application. daily as needed (eczema).      diclofenac Sodium (VOLTAREN) 1 % GEL Apply 2 g topically daily as needed (pain).      DULoxetine (CYMBALTA) 30 MG capsule Take 30 mg by mouth in the morning, at noon,  and at bedtime.      Fluticasone Furoate (ARNUITY ELLIPTA) 200 MCG/ACT AEPB Inhale 1 puff into the lungs daily. 30 each 11    gabapentin (NEURONTIN) 300 MG capsule Take 300 mg by mouth 2 (two) times daily.      metFORMIN (GLUCOPHAGE) 500 MG tablet 1 po with lunch and dinner 180 tablet 0    metoprolol tartrate (LOPRESSOR) 25 MG tablet Take 50 mg by mouth 2 (two) times daily.      Multiple Vitamin (MULTIVITAMIN) tablet Take 1 tablet by mouth daily.       NON FORMULARY Pt uses a cpap nightly      Omega-3 Fatty Acids (FISH OIL) 500 MG CAPS Take 500 mg by mouth daily.      pantoprazole (PROTONIX) 40 MG tablet Take 40 mg by mouth 2 (two) times daily.      Polyethylene Glycol 400 (BLINK TEARS OP) Place 1 drop into both eyes as needed (dry eyes).      spironolactone (ALDACTONE) 25 MG tablet Take 12.5 mg by mouth daily.      traMADol (ULTRAM) 50 MG tablet Take 1 tablet (50 mg total) by mouth every 8 (eight) hours as needed. 20 tablet 0    traZODone (DESYREL) 100 MG tablet Take 100 mg by mouth at bedtime.      triamcinolone cream (KENALOG) 0.1 % Apply 1 application. topically daily as needed (itching).      chlorpheniramine (CHLOR-TRIMETON) 4 MG tablet Take 1 tablet (4 mg total) by mouth every 6 (six) hours. (Patient not taking: Reported on 08/30/2022) 120 tablet 0 Not Taking   diazepam (VALIUM) 5 MG tablet Take one tablet by mouth with food one hour prior to procedure. May repeat 30 minutes prior if needed. (Patient not taking: Reported on 08/30/2022) 2 tablet 0 Not Taking   fluticasone (FLONASE) 50 MCG/ACT nasal spray Place 2 sprays into both nostrils daily. (Patient not taking: Reported on 08/30/2022) 16 g 1 Not Taking   meloxicam (MOBIC) 7.5 MG tablet TAKE 1 TABLET(7.5 MG) BY MOUTH DAILY (Patient not taking: Reported on 08/30/2022) 30 tablet 0 Not Taking   Xylitol 500 MG DISK Use as directed 1 tablet in the mouth or throat as needed (as needed for dry mouth).      Allergies  Allergen Reactions   Avapro  [Irbesartan] Shortness Of Breath   Dextromethorphan Hives and Shortness Of Breath   Guaifenesin & Derivatives Hives and Shortness Of Breath   Valtrex [Valacyclovir] Hives and Shortness Of Breath    Pt states she's not allergic to this medication   Citalopram     Other reaction(s): Chills (intolerance)    Codeine Nausea And Vomiting   Duloxetine     Feel like in a fog    Penicillin G Rash   Vilazodone Nausea Only    Social History   Tobacco Use   Smoking status: Never    Passive exposure: Never   Smokeless tobacco: Never  Substance Use Topics  Alcohol use: No    Alcohol/week: 0.0 standard drinks of alcohol    Family History  Problem Relation Age of Onset   Diabetes Mother    Heart disease Mother    Anxiety disorder Mother    Obesity Mother    Diabetes Father    Hypertension Father    Obesity Father      Review of Systems  Objective:  Physical Exam Vitals reviewed.  Constitutional:      Appearance: Normal appearance. She is obese.  HENT:     Head: Normocephalic and atraumatic.  Eyes:     Extraocular Movements: Extraocular movements intact.     Pupils: Pupils are equal, round, and reactive to light.  Cardiovascular:     Rate and Rhythm: Normal rate.  Pulmonary:     Effort: Pulmonary effort is normal.     Breath sounds: Normal breath sounds.  Abdominal:     Palpations: Abdomen is soft.  Musculoskeletal:     Cervical back: Normal range of motion and neck supple.     Right knee: Effusion, bony tenderness and crepitus present. Decreased range of motion. Tenderness present over the medial joint line and lateral joint line. Abnormal alignment.  Neurological:     Mental Status: She is alert and oriented to person, place, and time.  Psychiatric:        Behavior: Behavior normal.     Vital signs in last 24 hours:    Labs:   Estimated body mass index is 38.11 kg/m as calculated from the following:   Height as of 09/05/22: 5\' 4"  (1.626 m).   Weight as of  09/05/22: 100.7 kg.   Imaging Review Plain radiographs demonstrate severe degenerative joint disease of the right knee(s). The overall alignment ismild valgus. The bone quality appears to be good for age and reported activity level.      Assessment/Plan:  End stage arthritis, right knee   The patient history, physical examination, clinical judgment of the provider and imaging studies are consistent with end stage degenerative joint disease of the right knee(s) and total knee arthroplasty is deemed medically necessary. The treatment options including medical management, injection therapy arthroscopy and arthroplasty were discussed at length. The risks and benefits of total knee arthroplasty were presented and reviewed. The risks due to aseptic loosening, infection, stiffness, patella tracking problems, thromboembolic complications and other imponderables were discussed. The patient acknowledged the explanation, agreed to proceed with the plan and consent was signed. Patient is being admitted for inpatient treatment for surgery, pain control, PT, OT, prophylactic antibiotics, VTE prophylaxis, progressive ambulation and ADL's and discharge planning. The patient is planning to be discharged home with home health services

## 2022-09-14 ENCOUNTER — Inpatient Hospital Stay (HOSPITAL_COMMUNITY)
Admission: RE | Admit: 2022-09-14 | Discharge: 2022-09-17 | DRG: 470 | Disposition: A | Discharge status: Home Health | Payer: Medicare Other | Attending: Orthopaedic Surgery | Admitting: Orthopaedic Surgery

## 2022-09-14 ENCOUNTER — Ambulatory Visit (HOSPITAL_COMMUNITY): Payer: Medicare Other | Admitting: Anesthesiology

## 2022-09-14 ENCOUNTER — Observation Stay (HOSPITAL_COMMUNITY): Payer: Medicare Other

## 2022-09-14 ENCOUNTER — Other Ambulatory Visit: Payer: Self-pay

## 2022-09-14 ENCOUNTER — Encounter (HOSPITAL_COMMUNITY)
Admission: RE | Disposition: A | Discharge status: Home Health | Payer: Self-pay | Source: Home / Self Care | Attending: Orthopaedic Surgery

## 2022-09-14 ENCOUNTER — Encounter (HOSPITAL_COMMUNITY): Payer: Self-pay | Admitting: Orthopaedic Surgery

## 2022-09-14 ENCOUNTER — Ambulatory Visit (HOSPITAL_COMMUNITY): Payer: Medicare Other | Admitting: Medical

## 2022-09-14 DIAGNOSIS — I10 Essential (primary) hypertension: Secondary | ICD-10-CM | POA: Diagnosis present

## 2022-09-14 DIAGNOSIS — Z7951 Long term (current) use of inhaled steroids: Secondary | ICD-10-CM

## 2022-09-14 DIAGNOSIS — F32A Depression, unspecified: Secondary | ICD-10-CM | POA: Diagnosis present

## 2022-09-14 DIAGNOSIS — Z90710 Acquired absence of both cervix and uterus: Secondary | ICD-10-CM

## 2022-09-14 DIAGNOSIS — G4733 Obstructive sleep apnea (adult) (pediatric): Secondary | ICD-10-CM | POA: Diagnosis not present

## 2022-09-14 DIAGNOSIS — M1711 Unilateral primary osteoarthritis, right knee: Principal | ICD-10-CM | POA: Diagnosis present

## 2022-09-14 DIAGNOSIS — Z818 Family history of other mental and behavioral disorders: Secondary | ICD-10-CM

## 2022-09-14 DIAGNOSIS — E559 Vitamin D deficiency, unspecified: Secondary | ICD-10-CM | POA: Diagnosis present

## 2022-09-14 DIAGNOSIS — F419 Anxiety disorder, unspecified: Secondary | ICD-10-CM | POA: Diagnosis present

## 2022-09-14 DIAGNOSIS — K219 Gastro-esophageal reflux disease without esophagitis: Secondary | ICD-10-CM | POA: Diagnosis present

## 2022-09-14 DIAGNOSIS — E119 Type 2 diabetes mellitus without complications: Secondary | ICD-10-CM | POA: Diagnosis present

## 2022-09-14 DIAGNOSIS — Z8249 Family history of ischemic heart disease and other diseases of the circulatory system: Secondary | ICD-10-CM

## 2022-09-14 DIAGNOSIS — J4520 Mild intermittent asthma, uncomplicated: Secondary | ICD-10-CM | POA: Diagnosis not present

## 2022-09-14 DIAGNOSIS — Z880 Allergy status to penicillin: Secondary | ICD-10-CM

## 2022-09-14 DIAGNOSIS — Z79899 Other long term (current) drug therapy: Secondary | ICD-10-CM

## 2022-09-14 DIAGNOSIS — Z96651 Presence of right artificial knee joint: Secondary | ICD-10-CM

## 2022-09-14 DIAGNOSIS — Z885 Allergy status to narcotic agent status: Secondary | ICD-10-CM

## 2022-09-14 DIAGNOSIS — Z888 Allergy status to other drugs, medicaments and biological substances status: Secondary | ICD-10-CM

## 2022-09-14 DIAGNOSIS — Z833 Family history of diabetes mellitus: Secondary | ICD-10-CM

## 2022-09-14 DIAGNOSIS — Z883 Allergy status to other anti-infective agents status: Secondary | ICD-10-CM

## 2022-09-14 DIAGNOSIS — Z7984 Long term (current) use of oral hypoglycemic drugs: Secondary | ICD-10-CM

## 2022-09-14 HISTORY — PX: TOTAL KNEE ARTHROPLASTY: SHX125

## 2022-09-14 LAB — GLUCOSE, CAPILLARY
Glucose-Capillary: 127 mg/dL — ABNORMAL HIGH (ref 70–99)
Glucose-Capillary: 98 mg/dL (ref 70–99)

## 2022-09-14 SURGERY — ARTHROPLASTY, KNEE, TOTAL
Anesthesia: Regional | Site: Knee | Laterality: Right

## 2022-09-14 MED ORDER — PROPOFOL 500 MG/50ML IV EMUL
INTRAVENOUS | Status: DC | PRN
  Administered 2022-09-14: 75 ug/kg/min via INTRAVENOUS

## 2022-09-14 MED ORDER — MENTHOL 3 MG MT LOZG: 1.0000 | LOZENGE | OROMUCOSAL | Status: DC | PRN

## 2022-09-14 MED ORDER — ONDANSETRON HCL 4 MG/2ML IJ SOLN
4.0000 mg | Freq: Once | INTRAMUSCULAR | Status: AC | PRN
  Administered 2022-09-14: 4 mg via INTRAVENOUS

## 2022-09-14 MED ORDER — CHLORHEXIDINE GLUCONATE 0.12 % MT SOLN
15.0000 mL | Freq: Once | OROMUCOSAL | Status: AC
  Administered 2022-09-14: 15 mL via OROMUCOSAL

## 2022-09-14 MED ORDER — MIDAZOLAM HCL 2 MG/2ML IJ SOLN: 2.0000 mg | INTRAMUSCULAR | Status: DC

## 2022-09-14 MED ORDER — HYDROMORPHONE HCL 1 MG/ML IJ SOLN
0.5000 mg | INTRAMUSCULAR | Status: DC | PRN
  Administered 2022-09-14 – 2022-09-15 (×3): 1 mg via INTRAVENOUS
  Filled 2022-09-14 (×3): qty 1

## 2022-09-14 MED ORDER — ORAL CARE MOUTH RINSE: 15.0000 mL | OROMUCOSAL | Status: DC | PRN

## 2022-09-14 MED ORDER — CEFAZOLIN SODIUM-DEXTROSE 2-4 GM/100ML-% IV SOLN
2.0000 g | INTRAVENOUS | Status: AC
  Administered 2022-09-14: 2 g via INTRAVENOUS
  Filled 2022-09-14: qty 100

## 2022-09-14 MED ORDER — LIDOCAINE 2% (20 MG/ML) 5 ML SYRINGE
INTRAMUSCULAR | Status: DC | PRN
  Administered 2022-09-14: 80 mg via INTRAVENOUS

## 2022-09-14 MED ORDER — SODIUM CHLORIDE 0.9 % IV SOLN: INTRAVENOUS | Status: DC

## 2022-09-14 MED ORDER — BUPIVACAINE IN DEXTROSE 0.75-8.25 % IT SOLN
INTRATHECAL | Status: DC | PRN
  Administered 2022-09-14: 12 mg via INTRATHECAL

## 2022-09-14 MED ORDER — SPIRONOLACTONE 12.5 MG HALF TABLET
12.5000 mg | ORAL_TABLET | Freq: Every day | ORAL | Status: DC
  Administered 2022-09-14 – 2022-09-17 (×4): 12.5 mg via ORAL
  Filled 2022-09-14 (×4): qty 1

## 2022-09-14 MED ORDER — CLONIDINE HCL (ANALGESIA) 100 MCG/ML EP SOLN
EPIDURAL | Status: DC | PRN
Start: 2022-09-14 — End: 2022-09-14
  Administered 2022-09-14: 100 ug

## 2022-09-14 MED ORDER — DIPHENHYDRAMINE HCL 12.5 MG/5ML PO ELIX: 12.5000 mg | ORAL_SOLUTION | ORAL | Status: DC | PRN

## 2022-09-14 MED ORDER — BUPIVACAINE-EPINEPHRINE 0.25% -1:200000 IJ SOLN
INTRAMUSCULAR | Status: DC | PRN
  Administered 2022-09-14: 30 mL

## 2022-09-14 MED ORDER — TRANEXAMIC ACID-NACL 1000-0.7 MG/100ML-% IV SOLN
1000.0000 mg | INTRAVENOUS | Status: AC
  Administered 2022-09-14: 1000 mg via INTRAVENOUS
  Filled 2022-09-14: qty 100

## 2022-09-14 MED ORDER — 0.9 % SODIUM CHLORIDE (POUR BTL) OPTIME
TOPICAL | Status: DC | PRN
  Administered 2022-09-14: 1000 mL

## 2022-09-14 MED ORDER — POVIDONE-IODINE 10 % EX SWAB: 2.0000 | Freq: Once | CUTANEOUS | Status: AC

## 2022-09-14 MED ORDER — ACETAMINOPHEN 325 MG PO TABS: 325.0000 mg | ORAL_TABLET | Freq: Four times a day (QID) | ORAL | Status: DC | PRN

## 2022-09-14 MED ORDER — PHENYLEPHRINE HCL-NACL 20-0.9 MG/250ML-% IV SOLN
INTRAVENOUS | Status: DC | PRN
Start: 2022-09-14 — End: 2022-09-14
  Administered 2022-09-14: 25 ug/min via INTRAVENOUS

## 2022-09-14 MED ORDER — SODIUM CHLORIDE 0.9 % IR SOLN
Status: DC | PRN
  Administered 2022-09-14: 1000 mL

## 2022-09-14 MED ORDER — ONDANSETRON HCL 4 MG PO TABS: 4.0000 mg | ORAL_TABLET | Freq: Four times a day (QID) | ORAL | Status: DC | PRN

## 2022-09-14 MED ORDER — DOCUSATE SODIUM 100 MG PO CAPS
100.0000 mg | ORAL_CAPSULE | Freq: Two times a day (BID) | ORAL | Status: DC
  Administered 2022-09-14 – 2022-09-17 (×6): 100 mg via ORAL
  Filled 2022-09-14 (×6): qty 1

## 2022-09-14 MED ORDER — OXYCODONE HCL 5 MG PO TABS
10.0000 mg | ORAL_TABLET | ORAL | Status: DC | PRN
  Administered 2022-09-15 – 2022-09-16 (×3): 10 mg via ORAL
  Filled 2022-09-14 (×2): qty 2

## 2022-09-14 MED ORDER — DEXAMETHASONE SODIUM PHOSPHATE 10 MG/ML IJ SOLN
INTRAMUSCULAR | Status: DC | PRN
  Administered 2022-09-14: 5 mg via INTRAVENOUS

## 2022-09-14 MED ORDER — METOCLOPRAMIDE HCL 5 MG PO TABS: 5.0000 mg | ORAL_TABLET | Freq: Three times a day (TID) | ORAL | Status: DC | PRN

## 2022-09-14 MED ORDER — ONDANSETRON HCL 4 MG/2ML IJ SOLN
INTRAMUSCULAR | Status: AC
  Filled 2022-09-14: qty 2

## 2022-09-14 MED ORDER — ACETAMINOPHEN 10 MG/ML IV SOLN
1000.0000 mg | Freq: Once | INTRAVENOUS | Status: DC | PRN
  Administered 2022-09-14: 1000 mg via INTRAVENOUS

## 2022-09-14 MED ORDER — PROPOFOL 10 MG/ML IV BOLUS
INTRAVENOUS | Status: DC | PRN
  Administered 2022-09-14: 30 mg via INTRAVENOUS

## 2022-09-14 MED ORDER — METOPROLOL TARTRATE 50 MG PO TABS
50.0000 mg | ORAL_TABLET | Freq: Two times a day (BID) | ORAL | Status: DC
  Administered 2022-09-14 – 2022-09-17 (×6): 50 mg via ORAL
  Filled 2022-09-14 (×6): qty 1

## 2022-09-14 MED ORDER — STERILE WATER FOR IRRIGATION IR SOLN
Status: DC | PRN
  Administered 2022-09-14: 2000 mL

## 2022-09-14 MED ORDER — OXYCODONE HCL 5 MG PO TABS
ORAL_TABLET | ORAL | Status: AC
  Administered 2022-09-14: 5 mg via ORAL
  Filled 2022-09-14: qty 1

## 2022-09-14 MED ORDER — DULOXETINE HCL 30 MG PO CPEP
30.0000 mg | ORAL_CAPSULE | Freq: Every day | ORAL | Status: DC
  Administered 2022-09-15 – 2022-09-17 (×3): 30 mg via ORAL
  Filled 2022-09-14 (×3): qty 1

## 2022-09-14 MED ORDER — ASPIRIN 81 MG PO CHEW
81.0000 mg | CHEWABLE_TABLET | Freq: Two times a day (BID) | ORAL | Status: DC
  Administered 2022-09-14 – 2022-09-17 (×6): 81 mg via ORAL
  Filled 2022-09-14 (×6): qty 1

## 2022-09-14 MED ORDER — OXYCODONE HCL 5 MG PO TABS
5.0000 mg | ORAL_TABLET | ORAL | Status: DC | PRN
  Administered 2022-09-14 – 2022-09-16 (×6): 10 mg via ORAL
  Administered 2022-09-17: 5 mg via ORAL
  Administered 2022-09-17: 10 mg via ORAL
  Administered 2022-09-17: 5 mg via ORAL
  Filled 2022-09-14 (×3): qty 2
  Filled 2022-09-14: qty 1
  Filled 2022-09-14 (×2): qty 2
  Filled 2022-09-14: qty 1
  Filled 2022-09-14 (×3): qty 2

## 2022-09-14 MED ORDER — FENTANYL CITRATE PF 50 MCG/ML IJ SOSY
100.0000 ug | PREFILLED_SYRINGE | INTRAMUSCULAR | Status: DC
  Administered 2022-09-14: 50 ug via INTRAVENOUS
  Filled 2022-09-14: qty 2

## 2022-09-14 MED ORDER — GABAPENTIN 300 MG PO CAPS
300.0000 mg | ORAL_CAPSULE | Freq: Two times a day (BID) | ORAL | Status: DC
  Administered 2022-09-14 – 2022-09-17 (×6): 300 mg via ORAL
  Filled 2022-09-14 (×6): qty 1

## 2022-09-14 MED ORDER — ALUM & MAG HYDROXIDE-SIMETH 200-200-20 MG/5ML PO SUSP
30.0000 mL | ORAL | Status: DC | PRN
  Administered 2022-09-15: 30 mL via ORAL
  Filled 2022-09-14: qty 30

## 2022-09-14 MED ORDER — ROPIVACAINE HCL 5 MG/ML IJ SOLN
INTRAMUSCULAR | Status: DC | PRN
Start: 2022-09-14 — End: 2022-09-14
  Administered 2022-09-14: 3 mL via PERINEURAL

## 2022-09-14 MED ORDER — ORAL CARE MOUTH RINSE: 15.0000 mL | Freq: Once | OROMUCOSAL | Status: AC

## 2022-09-14 MED ORDER — PANTOPRAZOLE SODIUM 40 MG PO TBEC
40.0000 mg | DELAYED_RELEASE_TABLET | Freq: Two times a day (BID) | ORAL | Status: DC
  Administered 2022-09-14 – 2022-09-17 (×6): 40 mg via ORAL
  Filled 2022-09-14 (×6): qty 1

## 2022-09-14 MED ORDER — TRAZODONE HCL 100 MG PO TABS
100.0000 mg | ORAL_TABLET | Freq: Every day | ORAL | Status: DC
  Administered 2022-09-14 – 2022-09-16 (×3): 100 mg via ORAL
  Filled 2022-09-14 (×3): qty 1

## 2022-09-14 MED ORDER — ONDANSETRON HCL 4 MG/2ML IJ SOLN: 4.0000 mg | Freq: Four times a day (QID) | INTRAMUSCULAR | Status: DC | PRN

## 2022-09-14 MED ORDER — METOCLOPRAMIDE HCL 5 MG/ML IJ SOLN: 5.0000 mg | Freq: Three times a day (TID) | INTRAMUSCULAR | Status: DC | PRN

## 2022-09-14 MED ORDER — ONDANSETRON HCL 4 MG/2ML IJ SOLN
INTRAMUSCULAR | Status: DC | PRN
  Administered 2022-09-14: 4 mg via INTRAVENOUS

## 2022-09-14 MED ORDER — ALBUTEROL SULFATE (2.5 MG/3ML) 0.083% IN NEBU: 3.0000 mL | INHALATION_SOLUTION | Freq: Four times a day (QID) | RESPIRATORY_TRACT | Status: DC | PRN

## 2022-09-14 MED ORDER — METFORMIN HCL 500 MG PO TABS
500.0000 mg | ORAL_TABLET | Freq: Two times a day (BID) | ORAL | Status: DC
  Administered 2022-09-14 – 2022-09-17 (×6): 500 mg via ORAL
  Filled 2022-09-14 (×6): qty 1

## 2022-09-14 MED ORDER — PHENOL 1.4 % MT LIQD: 1.0000 | OROMUCOSAL | Status: DC | PRN

## 2022-09-14 MED ORDER — CEFAZOLIN SODIUM-DEXTROSE 1-4 GM/50ML-% IV SOLN
1.0000 g | Freq: Four times a day (QID) | INTRAVENOUS | Status: AC
  Administered 2022-09-14 – 2022-09-15 (×2): 1 g via INTRAVENOUS
  Filled 2022-09-14 (×2): qty 50

## 2022-09-14 MED ORDER — LACTATED RINGERS IV SOLN: INTRAVENOUS | Status: DC

## 2022-09-14 MED ORDER — BUPIVACAINE-EPINEPHRINE 0.25% -1:200000 IJ SOLN
INTRAMUSCULAR | Status: AC
  Filled 2022-09-14: qty 1

## 2022-09-14 MED ORDER — FENTANYL CITRATE PF 50 MCG/ML IJ SOSY: 25.0000 ug | PREFILLED_SYRINGE | INTRAMUSCULAR | Status: DC | PRN

## 2022-09-14 MED ORDER — ACETAMINOPHEN 10 MG/ML IV SOLN
INTRAVENOUS | Status: AC
  Filled 2022-09-14: qty 100

## 2022-09-14 MED ORDER — BACLOFEN 10 MG PO TABS
10.0000 mg | ORAL_TABLET | Freq: Four times a day (QID) | ORAL | Status: DC | PRN
  Administered 2022-09-17: 10 mg via ORAL
  Filled 2022-09-14: qty 1

## 2022-09-14 SURGICAL SUPPLY — 61 items
APL SKNCLS STERI-STRIP NONHPOA (GAUZE/BANDAGES/DRESSINGS)
BAG COUNTER SPONGE SURGICOUNT (BAG) IMPLANT
BAG SPEC THK2 15X12 ZIP CLS (MISCELLANEOUS) ×1
BAG SPNG CNTER NS LX DISP (BAG)
BAG ZIPLOCK 12X15 (MISCELLANEOUS) ×1 IMPLANT
BENZOIN TINCTURE PRP APPL 2/3 (GAUZE/BANDAGES/DRESSINGS) IMPLANT
BLADE SAG 18X100X1.27 (BLADE) ×1 IMPLANT
BLADE SURG SZ10 CARB STEEL (BLADE) ×2 IMPLANT
BNDG CMPR 6 X 5 YARDS HK CLSR (GAUZE/BANDAGES/DRESSINGS) ×1
BNDG ELASTIC 6INX 5YD STR LF (GAUZE/BANDAGES/DRESSINGS) ×2 IMPLANT
BOWL SMART MIX CTS (DISPOSABLE) IMPLANT
CEMENT BONE R 1X40 (Cement) IMPLANT
CEMENT BONE SIMPLEX SPEEDSET (Cement) IMPLANT
COMP FEM STD PS 8 RT (Joint) ×1 IMPLANT
COMPONENT FEM STD PS 8 RT (Joint) IMPLANT
COOLER ICEMAN CLASSIC (MISCELLANEOUS) ×1 IMPLANT
COVER SURGICAL LIGHT HANDLE (MISCELLANEOUS) ×1 IMPLANT
CUFF TOURN SGL QUICK 34 (TOURNIQUET CUFF) ×1
CUFF TRNQT CYL 34X4.125X (TOURNIQUET CUFF) ×1 IMPLANT
DRAPE INCISE IOBAN 66X45 STRL (DRAPES) ×1 IMPLANT
DRAPE U-SHAPE 47X51 STRL (DRAPES) ×1 IMPLANT
DURAPREP 26ML APPLICATOR (WOUND CARE) ×1 IMPLANT
ELECT BLADE TIP CTD 4 INCH (ELECTRODE) ×1 IMPLANT
ELECT REM PT RETURN 15FT ADLT (MISCELLANEOUS) ×1 IMPLANT
GAUZE PAD ABD 8X10 STRL (GAUZE/BANDAGES/DRESSINGS) ×2 IMPLANT
GAUZE SPONGE 4X4 12PLY STRL (GAUZE/BANDAGES/DRESSINGS) ×1 IMPLANT
GAUZE XEROFORM 1X8 LF (GAUZE/BANDAGES/DRESSINGS) IMPLANT
GLOVE BIO SURGEON STRL SZ7.5 (GLOVE) ×1 IMPLANT
GLOVE BIOGEL PI IND STRL 8 (GLOVE) ×2 IMPLANT
GLOVE ECLIPSE 8.0 STRL XLNG CF (GLOVE) ×1 IMPLANT
GOWN STRL REUS W/ TWL XL LVL3 (GOWN DISPOSABLE) ×2 IMPLANT
GOWN STRL REUS W/TWL XL LVL3 (GOWN DISPOSABLE) ×2
HANDPIECE INTERPULSE COAX TIP (DISPOSABLE) ×1
HOLDER FOLEY CATH W/STRAP (MISCELLANEOUS) IMPLANT
IMMOBILIZER KNEE 20 (SOFTGOODS) ×1
IMMOBILIZER KNEE 20 THIGH 36 (SOFTGOODS) ×1 IMPLANT
INSERT TIB PERS SZ 8-9 10 RT (Insert) IMPLANT
KIT TURNOVER KIT A (KITS) IMPLANT
NS IRRIG 1000ML POUR BTL (IV SOLUTION) ×1 IMPLANT
PACK TOTAL KNEE CUSTOM (KITS) ×1 IMPLANT
PAD COLD SHLDR WRAP-ON (PAD) ×1 IMPLANT
PADDING CAST COTTON 6X4 STRL (CAST SUPPLIES) ×2 IMPLANT
PIN DRILL HDLS TROCAR 75 4PK (PIN) IMPLANT
PROTECTOR NERVE ULNAR (MISCELLANEOUS) ×1 IMPLANT
SCREW FEMALE HEX FIX 25X2.5 (ORTHOPEDIC DISPOSABLE SUPPLIES) IMPLANT
SET HNDPC FAN SPRY TIP SCT (DISPOSABLE) ×1 IMPLANT
SET PAD KNEE POSITIONER (MISCELLANEOUS) ×1 IMPLANT
SPIKE FLUID TRANSFER (MISCELLANEOUS) IMPLANT
STAPLER VISISTAT 35W (STAPLE) IMPLANT
STEM POLY PAT PLY 29M KNEE (Knees) IMPLANT
STEM TIB PS KNEE D 0D RT (Stem) IMPLANT
STRIP CLOSURE SKIN 1/2X4 (GAUZE/BANDAGES/DRESSINGS) IMPLANT
SUT MNCRL AB 4-0 PS2 18 (SUTURE) IMPLANT
SUT VIC AB 0 CT1 27 (SUTURE) ×1
SUT VIC AB 0 CT1 27XBRD ANTBC (SUTURE) ×1 IMPLANT
SUT VIC AB 1 CT1 36 (SUTURE) ×2 IMPLANT
SUT VIC AB 2-0 CT1 27 (SUTURE) ×2
SUT VIC AB 2-0 CT1 TAPERPNT 27 (SUTURE) ×2 IMPLANT
TRAY FOLEY MTR SLVR 14FR STAT (SET/KITS/TRAYS/PACK) IMPLANT
TRAY FOLEY MTR SLVR 16FR STAT (SET/KITS/TRAYS/PACK) IMPLANT
WATER STERILE IRR 1000ML POUR (IV SOLUTION) ×2 IMPLANT

## 2022-09-14 NOTE — Progress Notes (Signed)
   09/14/22 2209  BiPAP/CPAP/SIPAP  $ Non-Invasive Home Ventilator  Initial  BiPAP/CPAP/SIPAP Pt Type Adult  BiPAP/CPAP/SIPAP DREAMSTATIOND  Reason BIPAP/CPAP not in use  (Pts home mask and tubing)  Mask Type Prongs-short  EPAP 5 cmH2O (Pt said her home settings are 5)  FiO2 (%) 21 %  Patient Home Equipment No  Auto Titrate No  BiPAP/CPAP /SiPAP Vitals  Pulse Rate 82  Resp 16  SpO2 98 %  Bilateral Breath Sounds Clear  MEWS Score/Color  MEWS Score 0  MEWS Score Color Green

## 2022-09-14 NOTE — Interval H&P Note (Signed)
History and Physical Interval Note: The patient understands that she is here today for right total knee replacement to treat her severe right knee arthritis.  There has been no acute or interval change in her medical status.  The risks and benefits of surgery been discussed in detail and informed consent has been obtained.  The right operative knee has been marked.  09/14/2022 12:09 PM  Neil Crouch  has presented today for surgery, with the diagnosis of osteoarthritis right knee.  The various methods of treatment have been discussed with the patient and family. After consideration of risks, benefits and other options for treatment, the patient has consented to  Procedure(s): RIGHT TOTAL KNEE ARTHROPLASTY (Right) as a surgical intervention.  The patient's history has been reviewed, patient examined, no change in status, stable for surgery.  I have reviewed the patient's chart and labs.  Questions were answered to the patient's satisfaction.     Kathryne Hitch

## 2022-09-14 NOTE — Anesthesia Procedure Notes (Addendum)
Anesthesia Regional Block: Adductor canal block   Pre-Anesthetic Checklist: , timeout performed,  Correct Patient, Correct Site, Correct Laterality,  Correct Procedure, Correct Position, site marked,  Risks and benefits discussed,  Surgical consent,  Pre-op evaluation,  At surgeon's request and post-op pain management  Laterality: Lower and Right  Prep: chloraprep       Needles:  Injection technique: Single-shot  Needle Type: Echogenic Needle     Needle Length: 9cm  Needle Gauge: 22     Additional Needles:   Procedures:,,,, ultrasound used (permanent image in chart),,    Narrative:  Start time: 09/14/2022 12:33 PM End time: 09/14/2022 12:39 PM Injection made incrementally with aspirations every 5 mL.  Performed by: Personally  Anesthesiologist: Trevor Iha, MD  Additional Notes: Block assessed prior to surgery. Pt tolerated procedure well.

## 2022-09-14 NOTE — Anesthesia Procedure Notes (Deleted)
Anesthesia Regional Block: Adductor canal block   Pre-Anesthetic Checklist: , timeout performed,  Correct Patient, Correct Site, Correct Laterality,  Correct Procedure, Correct Position, site marked,  Risks and benefits discussed,  Surgical consent,  Pre-op evaluation,  At surgeon's request and post-op pain management  Laterality: Lower  Prep: chloraprep       Needles:  Injection technique: Single-shot  Needle Type: Echogenic Needle     Needle Length: 9cm  Needle Gauge: 22     Additional Needles:   Procedures:,,,, ultrasound used (permanent image in chart),,    Narrative:  Injection made incrementally with aspirations every 5 mL.  Performed by: Personally  Anesthesiologist: Trevor Iha, MD  Additional Notes: Block assessed prior to surgery. Pt tolerated procedure well.

## 2022-09-14 NOTE — Op Note (Signed)
Operative Note  Date of operation: 09/14/2022 Operative diagnosis: Right knee severe primary osteoarthritis Postoperative diagnosis: Same  Procedure: Right total knee arthroplasty  Implants: Biomet/Zimmer persona knee system Implant Name Type Inv. Item Serial No. Manufacturer Lot No. LRB No. Used Action  CEMENT BONE R 1X40 - [id number] Cement CEMENT BONE R 1X40  ZIMMER RECON(ORTH,TRAU,BIO,SG) [id number] Right 2 Implanted  INSERT TIB PERS SZ 8-9 10 RT - [id number] Insert INSERT TIB PERS SZ 8-9 10 RT  ZIMMER RECON(ORTH,TRAU,BIO,SG) [id number] Right 1 Implanted  STEM POLY PAT PLY 54M KNEE - [id number] Knees STEM POLY PAT PLY 54M KNEE  ZIMMER RECON(ORTH,TRAU,BIO,SG) [id number] Right 1 Implanted  COMP FEM STD PS 8 RT - [id number] Joint COMP FEM STD PS 8 RT  ZIMMER RECON(ORTH,TRAU,BIO,SG) [id number] Right 1 Implanted  STEM TIB PS KNEE D 0D RT - [id number] Stem STEM TIB PS KNEE D 0D RT  ZIMMER RECON(ORTH,TRAU,BIO,SG) [id number] Right 1 Implanted   Surgeon: Vanita Panda. Magnus Ivan, MD Assistant: Rexene Edison, PA-C  Anesthesia: #1 right lower extremity adductor canal block, #2 spinal, #3 local Antibiotics: IV Ancef Tourniquet time: Less than 1 hour EBL: Less than 100 cc Complications: None  Indications: Crystal Jackson is a very active 77 year old female with debilitating arthritis involving her right knee.  This is been well-documented from clinical exam findings and x-ray findings.  She has tried and failed conservative treatment with her right knee.  At this point her right knee pain is daily and it is detrimentally affecting her mobility, her quality of life and actives daily living.  We have recommended knee replacement she does wish to proceed with this as well.  We did discuss the risk of acute blood loss anemia, nerve or vessel injury, fracture, infection, DVT, implant failure and wound healing issues.  She understands her goals are hopefully decrease pain, improve mobility, and improve quality of  life.  Procedure description: After informed consent was obtained and the appropriate right knee was marked, anesthesia obtained a right lower extremity adductor canal block in the holding room.  The patient was then brought to the operating room and set up on the operating table where spinal anesthesia was obtained.  She was laid in supine position on the operating table and a Foley catheter was placed.  A nonsterile fingers placed around her upper right thigh and her right thigh, knee, leg and ankle were prepped and draped with DuraPrep and sterile drapes including a sterile stockinette.  A timeout was called and she was identified as the correct patient the correct right knee.  An Esmarch was used to wrap out the leg and the tourniquet was inflated to 300 mm of pressure.  With the knee extended a midline incision was made over the patella and carried proximally distally.  Dissection was carried down the knee joint and a medial parapatellar arthrotomy was made.  A large joint effusion was encountered.  With the knee in a flexed position we found complete loss of cartilage on the lateral compartment and the patellofemoral joint of her knee and significant wear medially.  There were large osteophytes around the knee which we removed.  We did remove remnants of the ACL as well as medial lateral meniscus.  With the knee in a flexed position we used an extramedullary cutting guide for making our proximal tibia cut correction for varus and valgus and a 5 degree slope.  We made this cut to take 2 mm off the low side.  We made the cut without difficulty.  We fortunately  found good quality hard bone.  We then went to the femur and used an intramedullary cutting guide through the notch of the femur for our distal femoral cut using this for a right knee at 5 degrees externally rotated making the cut without difficulty.  We brought the knee back down to full extension and with a 10 mm extension block achieved full  extension.  We then back to the femur and put a femoral sizing guide based off the epicondylar axis.  Based off of this we chose a size 8 femur.  We put a 4-in-1 cutting arc for a size 8 femur and made our anterior and posterior cuts followed our chamfer cuts.  We then backed down to the tibia and chose a size D right tibial tray for coverage over the tibial plateau setting the rotation of the tibial tubercle and the femur.  We made our keel punch and drill hole off of this and did prepare for press-fit implants.  We then trialed a size D right tibia followed by our size 8 right standard CR femur.  We placed a 10 mm right fixed-bearing medial congruent polythene insert.  We put the knee through several cycles of motion and we are pleased with range of motion and stability.  The patella was incredibly thin so we made a patella cut and drilled 3 holes for a cemented patella as opposed to press-fit patella because of the patella being too thin for press-fit patella.  With all trial rotation in the knee again we are pleased with range of motion and stability.  We then removed all transportation from the knee and irrigate the knee with normal saline solution using pulsatile lavage.  We then placed Marcaine with epinephrine around the arthrotomy.  Next with the knee in a flexed position we placed our press-fit Biomet Zimmer right size D tibial tray followed by press fitting our size 8 right CR standard femur.  We placed our 10 mm thickness right medial congruent polyethylene insert and cemented our size 29 patella button.  We did held the patella compressed while the cement hardened.  Once it hardened we let the tourniquet down and hemostasis was obtained electrocautery.  The arthrotomy was then closed with interrupted #1 Vicryl suture followed by 0 Vicryl to close the deep tissue and 2-0 Vicryl to close the subcutaneous tissue.  The skin was closed with staples.  Well-padded sterile dressing was applied.  The patient was  taken recovery room in stable condition.  Rexene Edison, PA-C did assist during the entire case and beginning to end and his assistance was crucial and medically necessary for soft tissue management and retraction, helping guide implant placement and a layered closure of the wound.

## 2022-09-14 NOTE — Transfer of Care (Signed)
Immediate Anesthesia Transfer of Care Note  [name]  Procedure(s) Performed: Procedure(s): RIGHT TOTAL KNEE ARTHROPLASTY (Right)  Patient Location: PACU  Anesthesia Type:Spinal  Level of Consciousness:  sedated, patient cooperative and responds to stimulation  Airway & Oxygen Therapy:Patient Spontanous Breathing and Patient connected to face mask oxgen  Post-op Assessment:  Report given to PACU RN and Post -op Vital signs reviewed and stable  Post vital signs:  Reviewed and stable  Last Vitals:  Vitals:   09/14/22 1240 09/14/22 1245  BP: (!) 159/64 (!) 148/67  Pulse: 66 72  Resp: 17 19  Temp:    SpO2: 95% 100%    Complications: No apparent anesthesia complications

## 2022-09-14 NOTE — Anesthesia Procedure Notes (Signed)
Spinal  Patient location during procedure: OR Reason for block: surgical anesthesia Staffing Performed: resident/CRNA  Anesthesiologist: Trevor Iha, MD Resident/CRNA: Theodosia Quay, CRNA Performed by: Theodosia Quay, CRNA Authorized by: Trevor Iha, MD   Preanesthetic Checklist Completed: patient identified, IV checked, site marked, risks and benefits discussed, surgical consent, monitors and equipment checked, pre-op evaluation and timeout performed Spinal Block Patient position: sitting Prep: DuraPrep Patient monitoring: heart rate, cardiac monitor, continuous pulse ox and blood pressure Approach: midline Location: L4-5 Injection technique: single-shot Needle Needle type: Pencan  Needle gauge: 24 G Needle length: 9 cm Needle insertion depth: 9 cm Assessment Sensory level: T6 Events: CSF return Additional Notes -heme, -para, VSS.  Lot and exp date OK.  Pt tolerated well.

## 2022-09-15 LAB — CBC
HCT: 36.9 % (ref 36.0–46.0)
Hemoglobin: 11.8 g/dL — ABNORMAL LOW (ref 12.0–15.0)
MCH: 30.6 pg (ref 26.0–34.0)
MCHC: 32 g/dL (ref 30.0–36.0)
MCV: 95.6 fL (ref 80.0–100.0)
Platelets: 185 10*3/uL (ref 150–400)
RBC: 3.86 MIL/uL — ABNORMAL LOW (ref 3.87–5.11)
RDW: 12.7 % (ref 11.5–15.5)
WBC: 11.7 10*3/uL — ABNORMAL HIGH (ref 4.0–10.5)
nRBC: 0 % (ref 0.0–0.2)

## 2022-09-15 LAB — BASIC METABOLIC PANEL
Anion gap: 12 (ref 5–15)
BUN: 16 mg/dL (ref 8–23)
CO2: 21 mmol/L — ABNORMAL LOW (ref 22–32)
Calcium: 8.2 mg/dL — ABNORMAL LOW (ref 8.9–10.3)
Chloride: 100 mmol/L (ref 98–111)
Creatinine, Ser: 0.84 mg/dL (ref 0.44–1.00)
GFR, Estimated: >60 mL/min (ref >60)
Glucose, Bld: 131 mg/dL — ABNORMAL HIGH (ref 70–99)
Potassium: 4.3 mmol/L (ref 3.5–5.1)
Sodium: 133 mmol/L — ABNORMAL LOW (ref 135–145)

## 2022-09-15 MED ORDER — KETOROLAC TROMETHAMINE 15 MG/ML IJ SOLN
7.5000 mg | Freq: Four times a day (QID) | INTRAMUSCULAR | Status: AC
  Administered 2022-09-15 (×3): 7.5 mg via INTRAVENOUS
  Filled 2022-09-15 (×3): qty 1

## 2022-09-15 NOTE — Plan of Care (Signed)
  Problem: Pain Management: Goal: Pain level will decrease with appropriate interventions Outcome: Progressing   

## 2022-09-15 NOTE — Progress Notes (Signed)
Subjective: 1 Day Post-Op Procedure(s) (LRB): RIGHT TOTAL KNEE ARTHROPLASTY (Right) Patient reports pain as moderate.    Objective: Vital signs in last 24 hours: Temp:  [97.6 F (36.4 C)-98.1 F (36.7 C)] 98 F (36.7 C) (09/14 0647) Pulse Rate:  [66-84] 82 (09/14 0647) Resp:  [14-19] 16 (09/14 0647) BP: (121-161)/(53-87) 150/70 (09/14 0647) SpO2:  [95 %-100 %] 97 % (09/14 0647) FiO2 (%):  [21 %] 21 % (09/13 2209) Weight:  [98.4 kg] 98.4 kg (09/13 1036)  Intake/Output from previous day: 09/13 0701 - 09/14 0700 In: 2386.3 [P.O.:240; I.V.:2046.3; IV Piggyback:100] Out: 1750 [Urine:1700; Blood:50] Intake/Output this shift: No intake/output data recorded.  Recent Labs    09/15/22 0358  HGB 11.8*   Recent Labs    09/15/22 0358  WBC 11.7*  RBC 3.86*  HCT 36.9  PLT 185   Recent Labs    09/15/22 0358  NA 133*  K 4.3  CL 100  CO2 21*  BUN 16  CREATININE 0.84  GLUCOSE 131*  CALCIUM 8.2*   No results for input(s): "LABPT", "INR" in the last 72 hours.  Sensation intact distally Intact pulses distally Dorsiflexion/Plantar flexion intact Incision: dressing C/D/I Compartment soft   Assessment/Plan: 1 Day Post-Op Procedure(s) (LRB): RIGHT TOTAL KNEE ARTHROPLASTY (Right) Up with therapy  The patient has not been up with therapy yet today.  We do have surgery over at [facility] later this morning.  My plan is to come back this afternoon to check on the patient and see how she has done with her mobility with PT.    Kathryne Hitch 09/15/2022, 8:12 AM

## 2022-09-15 NOTE — Discharge Instructions (Signed)

## 2022-09-15 NOTE — Care Management Obs Status (Signed)
MEDICARE OBSERVATION STATUS NOTIFICATION   Patient Details  Name: Crystal Jackson MRN: [id number] Date of Birth: 17-Apr-1945   Medicare Observation Status Notification Given:  Yes    Avarie Tavano Marsh Dolly, LCSW 09/15/2022, 1:46 PM

## 2022-09-15 NOTE — Progress Notes (Signed)
Physical Therapy Treatment Patient Details Name: Crystal Jackson MRN: [id number] DOB: 07/18/1945 Today's Date: 09/15/2022   History of Present Illness Pt s/p R TKR and with hx of DM, obesity, DM and vertigo    PT Comments  Pt continues cooperative but requiring increased time and significant assist for all tasks and fatigues very easily.  This pm, pt assisted up to ambulate limited distance in hall to [facility] for toileting and limited distance to transfer to bedside and to supine with assist.    If plan is discharge home, recommend the following: A little help with walking and/or transfers;A little help with bathing/dressing/bathroom;Assistance with cooking/housework;Assist for transportation;Help with stairs or ramp for entrance   Can travel by private vehicle        Equipment Recommendations  Rolling walker (2 wheels)    Recommendations for Other Services       Precautions / Restrictions Precautions Precautions: Knee;Fall Restrictions Weight Bearing Restrictions: No RLE Weight Bearing: Weight bearing as tolerated     Mobility  Bed Mobility Overal bed mobility: Needs Assistance Bed Mobility: Sit to Supine     Supine to sit: Mod assist Sit to supine: Mod assist   General bed mobility comments: Increased time with use of bed rails and cues for sequence and use of L LE to self assist.    Transfers Overall transfer level: Needs assistance Equipment used: Rolling walker (2 wheels) Transfers: Sit to/from Stand Sit to Stand: Min assist, Mod assist           General transfer comment: cues for LE management and use of UEs to self assist.  Physical assist to bring wt up and fwd and to balance in initial standing with RW    Ambulation/Gait Ambulation/Gait assistance: Min assist, Mod assist Gait Distance (Feet): 4 Feet (4' twice) Assistive device: Rolling walker (2 wheels) Gait Pattern/deviations: Step-to pattern, Decreased step length - right, Decreased step length - left,  Shuffle, Trunk flexed Gait velocity: decr     General Gait Details: Increased time with cues for sequence, posture and position from RW.  Assist for balance, support and RW management.  Distance ltd by pt report of fatigue   Stairs             Wheelchair Mobility     Tilt Bed    Modified Rankin (Stroke Patients Only)       Balance Overall balance assessment: Needs assistance Sitting-balance support: No upper extremity supported, Feet supported Sitting balance-Leahy Scale: Good     Standing balance support: Bilateral upper extremity supported Standing balance-Leahy Scale: Poor                              Cognition Arousal: Alert Behavior During Therapy: WFL for tasks assessed/performed Overall Cognitive Status: Within Functional Limits for tasks assessed                                          Exercises Total Joint Exercises Ankle Circles/Pumps: AROM, Both, 15 reps, Supine Quad Sets: AROM, Both, 10 reps, Supine Heel Slides: AAROM, Right, 15 reps, Supine Straight Leg Raises: AAROM, Right, 10 reps, Supine    General Comments        Pertinent Vitals/Pain Pain Assessment Pain Assessment: 0-10 Pain Score: 6  Pain Location: R knee Pain Descriptors / Indicators: Aching, Sore Pain Intervention(s): Limited activity  within patient's tolerance, Monitored during session, Premedicated before session, Ice applied    Home Living Family/patient expects to be discharged to:: Private residence Living Arrangements: Spouse/significant other Available Help at Discharge: Family;Available 24 hours/day Type of Home: House Home Access: Level entry       Home Layout: One level Home Equipment: Cane - single point      Prior Function            PT Goals (current goals can now be found in the care plan section) Acute Rehab PT Goals Patient Stated Goal: Regain IND PT Goal Formulation: With patient Time For Goal Achievement:  09/22/22 Potential to Achieve Goals: Good Progress towards PT goals: Progressing toward goals    Frequency    7X/week      PT Plan      Co-evaluation              AM-PAC PT "6 Clicks" Mobility   Outcome Measure  Help needed turning from your back to your side while in a flat bed without using bedrails?: A Little Help needed moving from lying on your back to sitting on the side of a flat bed without using bedrails?: A Lot Help needed moving to and from a bed to a chair (including a wheelchair)?: A Lot Help needed standing up from a chair using your arms (e.g., wheelchair or bedside chair)?: A Lot Help needed to walk in hospital room?: Total Help needed climbing 3-5 steps with a railing? : Total 6 Click Score: 11    End of Session Equipment Utilized During Treatment: Gait belt;Right knee immobilizer Activity Tolerance: Patient limited by fatigue Patient left: in bed;with call bell/phone within reach;with bed alarm set Nurse Communication: Mobility status PT Visit Diagnosis: Difficulty in walking, not elsewhere classified (R26.2);Muscle weakness (generalized) (M62.81)     Time: 1352-1410 PT Time Calculation (min) (ACUTE ONLY): 18 min  Charges:    $Gait Training: 8-22 mins $Therapeutic Exercise: 8-22 mins $Therapeutic Activity: 8-22 mins PT General Charges $$ ACUTE PT VISIT: 1 Visit                     Mauro Kaufmann PT Acute Rehabilitation Services Pager [phone] Office [phone]    [facility] 09/15/2022, 4:46 PM

## 2022-09-15 NOTE — Plan of Care (Signed)
Problem: Pain Management: Goal: Pain level will decrease with appropriate interventions Outcome: Progressing   Problem: Safety: Goal: Ability to remain free from injury will improve Outcome: Progressing

## 2022-09-15 NOTE — Evaluation (Signed)
Physical Therapy Evaluation Patient Details Name: Crystal Jackson MRN: [id number] DOB: Aug 28, 1945 Today's Date: 09/15/2022  History of Present Illness  Pt s/p R TKR and with hx of DM, obesity, DM and vertigo  Clinical Impression  Pt s/p R TKR and presents with decreased R LE strength/ROM and post op pain limiting functional mobility.  Pt should progress to dc home with family assist and HHPT follow up.        If plan is discharge home, recommend the following: A little help with walking and/or transfers;A little help with bathing/dressing/bathroom;Assistance with cooking/housework;Assist for transportation;Help with stairs or ramp for entrance   Can travel by private vehicle        Equipment Recommendations Rolling walker (2 wheels)  Recommendations for Other Services       Functional Status Assessment Patient has had a recent decline in their functional status and demonstrates the ability to make significant improvements in function in a reasonable and predictable amount of time.     Precautions / Restrictions Precautions Precautions: Knee;Fall Restrictions Weight Bearing Restrictions: No RLE Weight Bearing: Weight bearing as tolerated      Mobility  Bed Mobility Overal bed mobility: Needs Assistance Bed Mobility: Supine to Sit     Supine to sit: Mod assist     General bed mobility comments: Increased time with use of bed rails and cues for sequence and use of L LE to self assist.    Transfers Overall transfer level: Needs assistance   Transfers: Sit to/from Stand Sit to Stand: Min assist, Mod assist           General transfer comment: cues for LE management and use of UEs to self assist.  Physical assist to bring wt up and fwd and to balance in initial standing with RW    Ambulation/Gait Ambulation/Gait assistance: Min assist, Mod assist Gait Distance (Feet): 6 Feet Assistive device: Rolling walker (2 wheels) Gait Pattern/deviations: Step-to pattern,  Decreased step length - right, Decreased step length - left, Shuffle, Trunk flexed Gait velocity: decr     General Gait Details: Increased time with cues for sequence, posture and position from RW.  Assist for balance, support and RW management.  Distance ltd by pt report of fatigue and feeling "very hot"  Stairs            Wheelchair Mobility     Tilt Bed    Modified Rankin (Stroke Patients Only)       Balance Overall balance assessment: Needs assistance Sitting-balance support: No upper extremity supported, Feet supported Sitting balance-Leahy Scale: Good     Standing balance support: Bilateral upper extremity supported Standing balance-Leahy Scale: Poor                               Pertinent Vitals/Pain Pain Assessment Pain Assessment: 0-10 Pain Score: 5  Pain Location: R knee Pain Descriptors / Indicators: Aching, Sore Pain Intervention(s): Limited activity within patient's tolerance, Premedicated before session, Monitored during session, Ice applied    Home Living Family/patient expects to be discharged to:: Private residence Living Arrangements: Spouse/significant other Available Help at Discharge: Family;Available 24 hours/day Type of Home: House Home Access: Level entry       Home Layout: One level Home Equipment: Cane - single point      Prior Function Prior Level of Function : Independent/Modified Independent             Mobility Comments: using cane  as needed       Extremity/Trunk Assessment   Upper Extremity Assessment Upper Extremity Assessment: Overall WFL for tasks assessed    Lower Extremity Assessment Lower Extremity Assessment: RLE deficits/detail RLE Deficits / Details: 2+/5 quads with AAROM at knee -5 - 35    Cervical / Trunk Assessment Cervical / Trunk Assessment: Normal  Communication   Communication Communication: No apparent difficulties  Cognition Arousal: Alert Behavior During Therapy: WFL for  tasks assessed/performed Overall Cognitive Status: Within Functional Limits for tasks assessed                                          General Comments      Exercises Total Joint Exercises Ankle Circles/Pumps: AROM, Both, 15 reps, Supine Quad Sets: AROM, Both, 10 reps, Supine Heel Slides: AAROM, Right, 15 reps, Supine Straight Leg Raises: AAROM, Right, 10 reps, Supine   Assessment/Plan    PT Assessment Patient needs continued PT services  PT Problem List Decreased strength;Decreased range of motion;Decreased activity tolerance;Decreased balance;Decreased mobility;Decreased knowledge of use of DME;Obesity;Pain       PT Treatment Interventions DME instruction;Gait training;Stair training;Functional mobility training;Therapeutic exercise;Therapeutic activities;Patient/family education    PT Goals (Current goals can be found in the Care Plan section)  Acute Rehab PT Goals Patient Stated Goal: Regain IND PT Goal Formulation: With patient Time For Goal Achievement: 09/22/22 Potential to Achieve Goals: Good    Frequency 7X/week     Co-evaluation               AM-PAC PT "6 Clicks" Mobility  Outcome Measure Help needed turning from your back to your side while in a flat bed without using bedrails?: A Little Help needed moving from lying on your back to sitting on the side of a flat bed without using bedrails?: A Lot Help needed moving to and from a bed to a chair (including a wheelchair)?: A Lot Help needed standing up from a chair using your arms (e.g., wheelchair or bedside chair)?: A Lot Help needed to walk in hospital room?: Total Help needed climbing 3-5 steps with a railing? : Total 6 Click Score: 11    End of Session Equipment Utilized During Treatment: Gait belt;Right knee immobilizer Activity Tolerance: Patient limited by fatigue Patient left: in chair;with call bell/phone within reach;with chair alarm set Nurse Communication: Mobility  status PT Visit Diagnosis: Difficulty in walking, not elsewhere classified (R26.2);Muscle weakness (generalized) (M62.81)    Time: 1030-1110 PT Time Calculation (min) (ACUTE ONLY): 40 min   Charges:   PT Evaluation $PT Eval Low Complexity: 1 Low PT Treatments $Gait Training: 8-22 mins $Therapeutic Exercise: 8-22 mins PT General Charges $$ ACUTE PT VISIT: 1 Visit         Mauro Kaufmann PT Acute Rehabilitation Services Pager [phone] Office [phone]   Jouri Threat 09/15/2022, 1:10 PM

## 2022-09-15 NOTE — Progress Notes (Signed)
   09/15/22 1318  TOC Brief Assessment  Insurance and Status Reviewed  Patient has primary care physician Yes  Home environment has been reviewed From home with spouse  Prior level of function: Independent  Prior/Current Home Services No current home services  Social Determinants of Health Reivew SDOH reviewed no interventions necessary  Readmission risk has been reviewed Yes  Transition of care needs transition of care needs identified, TOC will continue to follow (TOC Consult/possible DME)

## 2022-09-15 NOTE — Anesthesia Postprocedure Evaluation (Signed)
Anesthesia Post Note  [name]  Procedure(s) Performed: RIGHT TOTAL KNEE ARTHROPLASTY (Right: Knee)     Patient location during evaluation: Nursing Unit Anesthesia Type: Regional and Spinal Level of consciousness: oriented and awake and alert Pain management: pain level controlled Vital Signs Assessment: post-procedure vital signs reviewed and stable Respiratory status: spontaneous breathing and respiratory function stable Cardiovascular status: blood pressure returned to baseline and stable Postop Assessment: no headache, no backache, no apparent nausea or vomiting and patient able to bend at knees Anesthetic complications: no   No notable events documented.  Last Vitals:  Vitals:   09/15/22 0932 09/15/22 1223  BP: (!) 142/62 134/67  Pulse: 80 82  Resp: 13 17  Temp: 36.4 C 36.8 C  SpO2: 100% 100%    Last Pain:  Vitals:   09/15/22 1223  TempSrc: Oral  PainSc:                  Trevor Iha

## 2022-09-15 NOTE — Plan of Care (Signed)
Problem: Education: Goal: Knowledge of the prescribed therapeutic regimen will improve Outcome: Progressing   Problem: Activity: Goal: Ability to avoid complications of mobility impairment will improve Outcome: Progressing   Problem: Pain Management: Goal: Pain level will decrease with appropriate interventions Outcome: Progressing

## 2022-09-15 NOTE — Progress Notes (Signed)
   09/15/22 2150  BiPAP/CPAP/SIPAP  BiPAP/CPAP/SIPAP Pt Type Adult  BiPAP/CPAP/SIPAP DREAMSTATIOND  Mask Type Prongs-short  Respiratory Rate 20 breaths/min  EPAP 5 cmH2O  FiO2 (%) 21 %  Patient Home Equipment No  Auto Titrate No  CPAP/SIPAP surface wiped down Yes  BiPAP/CPAP /SiPAP Vitals  Pulse Rate 87  Resp 20  SpO2 96 %  MEWS Score/Color  MEWS Score 0  MEWS Score Color [place] Si

## 2022-09-16 DIAGNOSIS — Z90710 Acquired absence of both cervix and uterus: Secondary | ICD-10-CM | POA: Diagnosis not present

## 2022-09-16 DIAGNOSIS — E119 Type 2 diabetes mellitus without complications: Secondary | ICD-10-CM | POA: Diagnosis present

## 2022-09-16 DIAGNOSIS — Z79899 Other long term (current) drug therapy: Secondary | ICD-10-CM | POA: Diagnosis not present

## 2022-09-16 DIAGNOSIS — E559 Vitamin D deficiency, unspecified: Secondary | ICD-10-CM | POA: Diagnosis present

## 2022-09-16 DIAGNOSIS — M1711 Unilateral primary osteoarthritis, right knee: Secondary | ICD-10-CM | POA: Diagnosis present

## 2022-09-16 DIAGNOSIS — Z7984 Long term (current) use of oral hypoglycemic drugs: Secondary | ICD-10-CM | POA: Diagnosis not present

## 2022-09-16 DIAGNOSIS — Z883 Allergy status to other anti-infective agents status: Secondary | ICD-10-CM | POA: Diagnosis not present

## 2022-09-16 DIAGNOSIS — Z96651 Presence of right artificial knee joint: Secondary | ICD-10-CM

## 2022-09-16 DIAGNOSIS — Z8249 Family history of ischemic heart disease and other diseases of the circulatory system: Secondary | ICD-10-CM | POA: Diagnosis not present

## 2022-09-16 DIAGNOSIS — Z888 Allergy status to other drugs, medicaments and biological substances status: Secondary | ICD-10-CM | POA: Diagnosis not present

## 2022-09-16 DIAGNOSIS — Z7951 Long term (current) use of inhaled steroids: Secondary | ICD-10-CM | POA: Diagnosis not present

## 2022-09-16 DIAGNOSIS — Z818 Family history of other mental and behavioral disorders: Secondary | ICD-10-CM | POA: Diagnosis not present

## 2022-09-16 DIAGNOSIS — F419 Anxiety disorder, unspecified: Secondary | ICD-10-CM | POA: Diagnosis present

## 2022-09-16 DIAGNOSIS — I10 Essential (primary) hypertension: Secondary | ICD-10-CM | POA: Diagnosis present

## 2022-09-16 DIAGNOSIS — K219 Gastro-esophageal reflux disease without esophagitis: Secondary | ICD-10-CM | POA: Diagnosis present

## 2022-09-16 DIAGNOSIS — Z885 Allergy status to narcotic agent status: Secondary | ICD-10-CM | POA: Diagnosis not present

## 2022-09-16 DIAGNOSIS — F32A Depression, unspecified: Secondary | ICD-10-CM | POA: Diagnosis present

## 2022-09-16 DIAGNOSIS — Z833 Family history of diabetes mellitus: Secondary | ICD-10-CM | POA: Diagnosis not present

## 2022-09-16 DIAGNOSIS — Z880 Allergy status to penicillin: Secondary | ICD-10-CM | POA: Diagnosis not present

## 2022-09-16 NOTE — Progress Notes (Signed)
Patient ID: Crystal Jackson, female   DOB: 1945-06-22, 77 y.o.   MRN: [id number] The patient is awake and alert.  She looks good overall.  She has worked with therapy this morning and I did speak with Durene Cal who feels that the patient would benefit from 1 more day of therapy before discharge safely to home.  Her vital signs are stable.  Her right operative knee is stable.  I agree with physical therapy's assessment as well.  I did talk to the patient as well about discharge tomorrow in order to maximize her mobility with therapy here.  She agrees to this as well.  Will change her to an inpatient admission.

## 2022-09-16 NOTE — Progress Notes (Signed)
   09/16/22 2347  BiPAP/CPAP/SIPAP  BiPAP/CPAP/SIPAP Pt Type Adult  BiPAP/CPAP/SIPAP DREAMSTATIOND  Mask Type Prongs-short  Respiratory Rate 18 breaths/min  EPAP 5 cmH2O  FiO2 (%) 21 %  Patient Home Equipment No  Auto Titrate No

## 2022-09-16 NOTE — Plan of Care (Signed)
Problem: Education: Goal: Knowledge of the prescribed therapeutic regimen will improve Outcome: Progressing   Problem: Pain Management: Goal: Pain level will decrease with appropriate interventions Outcome: Progressing   Problem: Activity: Goal: Ability to avoid complications of mobility impairment will improve Outcome: Progressing

## 2022-09-16 NOTE — Progress Notes (Signed)
Physical Therapy Treatment Patient Details Name: Crystal Jackson MRN: [id number] DOB: 17-Feb-1945 Today's Date: 09/16/2022   History of Present Illness Pt s/p R TKR and with hx of DM, obesity, DM and vertigo    PT Comments  Pt very cooperative and progressing with mobility.  Pt performed therex program with assist, requiring decreased assist for most tasks, and up to ambulate increased (but still limited) distance in halls.   If plan is discharge home, recommend the following: A little help with walking and/or transfers;A little help with bathing/dressing/bathroom;Assistance with cooking/housework;Assist for transportation;Help with stairs or ramp for entrance   Can travel by private vehicle        Equipment Recommendations  Rolling walker (2 wheels)    Recommendations for Other Services       Precautions / Restrictions Precautions Precautions: Knee;Fall Restrictions Weight Bearing Restrictions: No RLE Weight Bearing: Weight bearing as tolerated     Mobility  Bed Mobility Overal bed mobility: Needs Assistance Bed Mobility: Supine to Sit     Supine to sit: Min assist     General bed mobility comments: Increased time with use of bed rails and cues for sequence and use of L LE to self assist.    Transfers Overall transfer level: Needs assistance Equipment used: Rolling walker (2 wheels) Transfers: Sit to/from Stand Sit to Stand: Min assist           General transfer comment: cues for LE management and use of UEs to self assist.  Physical assist to bring wt up and fwd and to balance in initial standing with RW    Ambulation/Gait Ambulation/Gait assistance: Min assist Gait Distance (Feet): 26 Feet Assistive device: Rolling walker (2 wheels) Gait Pattern/deviations: Step-to pattern, Decreased step length - right, Decreased step length - left, Shuffle, Trunk flexed Gait velocity: decr     General Gait Details: Increased time with cues for sequence, posture and  position from RW.  Assist for balance, support and RW management.  Distance ltd by pt report of fatigue   Stairs             Wheelchair Mobility     Tilt Bed    Modified Rankin (Stroke Patients Only)       Balance Overall balance assessment: Needs assistance Sitting-balance support: No upper extremity supported, Feet supported Sitting balance-Leahy Scale: Good     Standing balance support: Single extremity supported                                Cognition Arousal: Alert Behavior During Therapy: WFL for tasks assessed/performed Overall Cognitive Status: Within Functional Limits for tasks assessed                                          Exercises Total Joint Exercises Ankle Circles/Pumps: AROM, Both, 15 reps, Supine Quad Sets: AROM, Both, 10 reps, Supine Heel Slides: AAROM, Right, 15 reps, Supine Straight Leg Raises: AAROM, Right, 10 reps, Supine    General Comments        Pertinent Vitals/Pain Pain Assessment Pain Assessment: 0-10 Pain Score: 5  Pain Location: R knee Pain Descriptors / Indicators: Aching, Sore Pain Intervention(s): Limited activity within patient's tolerance, Premedicated before session, Monitored during session, Ice applied    Home Living  Prior Function            PT Goals (current goals can now be found in the care plan section) Acute Rehab PT Goals Patient Stated Goal: Regain IND PT Goal Formulation: With patient Time For Goal Achievement: 09/22/22 Potential to Achieve Goals: Good Progress towards PT goals: Progressing toward goals    Frequency    7X/week      PT Plan      Co-evaluation              AM-PAC PT "6 Clicks" Mobility   Outcome Measure  Help needed turning from your back to your side while in a flat bed without using bedrails?: A Little Help needed moving from lying on your back to sitting on the side of a flat bed without using  bedrails?: A Little Help needed moving to and from a bed to a chair (including a wheelchair)?: A Little Help needed standing up from a chair using your arms (e.g., wheelchair or bedside chair)?: A Little Help needed to walk in hospital room?: A Little Help needed climbing 3-5 steps with a railing? : A Lot 6 Click Score: 17    End of Session Equipment Utilized During Treatment: Gait belt;Right knee immobilizer Activity Tolerance: Patient limited by fatigue Patient left: in chair;with call bell/phone within reach;with chair alarm set Nurse Communication: Mobility status PT Visit Diagnosis: Difficulty in walking, not elsewhere classified (R26.2);Muscle weakness (generalized) (M62.81)     Time: 0922-0950 PT Time Calculation (min) (ACUTE ONLY): 28 min  Charges:    $Gait Training: 8-22 mins $Therapeutic Exercise: 8-22 mins PT General Charges $$ ACUTE PT VISIT: 1 Visit                     Mauro Kaufmann PT Acute Rehabilitation Services Pager [phone] Office [phone]    Jordy Verba 09/16/2022, 12:46 PM

## 2022-09-16 NOTE — Progress Notes (Signed)
Physical Therapy Treatment Patient Details Name: Crystal Jackson MRN: [id number] DOB: 1945/07/13 Today's Date: 09/16/2022   History of Present Illness Pt s/p R TKR and with hx of DM, obesity, DM and vertigo    PT Comments  Pt continues slow steady progress with mobility but continues to require increased time for all tasks, assist for all tasks and fatigues easily.  Pt hopeful for dc home tomorrow.    If plan is discharge home, recommend the following: A little help with walking and/or transfers;A little help with bathing/dressing/bathroom;Assistance with cooking/housework;Assist for transportation;Help with stairs or ramp for entrance   Can travel by private vehicle        Equipment Recommendations  Rolling walker (2 wheels)    Recommendations for Other Services       Precautions / Restrictions Precautions Precautions: Knee;Fall Restrictions Weight Bearing Restrictions: No RLE Weight Bearing: Weight bearing as tolerated     Mobility  Bed Mobility Overal bed mobility: Needs Assistance Bed Mobility: Sit to Supine     Supine to sit: Min assist Sit to supine: Min assist   General bed mobility comments: Increased time with use of bed rails and cues for sequence and use of L LE to self assist.    Transfers Overall transfer level: Needs assistance Equipment used: Rolling walker (2 wheels) Transfers: Sit to/from Stand Sit to Stand: Min assist           General transfer comment: cues for LE management and use of UEs to self assist.  Physical assist to bring wt up and fwd and to balance in initial standing with RW    Ambulation/Gait Ambulation/Gait assistance: Min assist Gait Distance (Feet): 38 Feet Assistive device: Rolling walker (2 wheels) Gait Pattern/deviations: Step-to pattern, Decreased step length - right, Decreased step length - left, Shuffle, Trunk flexed Gait velocity: decr     General Gait Details: Increased time with cues for sequence, posture and  position from RW.  Assist for balance, support and RW management.  Distance ltd by pt report of fatigue   Stairs             Wheelchair Mobility     Tilt Bed    Modified Rankin (Stroke Patients Only)       Balance Overall balance assessment: Needs assistance Sitting-balance support: No upper extremity supported, Feet supported Sitting balance-Leahy Scale: Good     Standing balance support: Single extremity supported Standing balance-Leahy Scale: Poor                              Cognition Arousal: Alert Behavior During Therapy: WFL for tasks assessed/performed Overall Cognitive Status: Within Functional Limits for tasks assessed                                          Exercises Total Joint Exercises Ankle Circles/Pumps: AROM, Both, 15 reps, Supine Quad Sets: AROM, Both, 10 reps, Supine Heel Slides: AAROM, Right, 15 reps, Supine Straight Leg Raises: AAROM, Right, 10 reps, Supine    General Comments        Pertinent Vitals/Pain Pain Assessment Pain Assessment: 0-10 Pain Score: 5  Pain Location: R knee Pain Descriptors / Indicators: Aching, Sore Pain Intervention(s): Monitored during session, Limited activity within patient's tolerance, Premedicated before session, Ice applied    Home Living  Prior Function            PT Goals (current goals can now be found in the care plan section) Acute Rehab PT Goals Patient Stated Goal: Regain IND PT Goal Formulation: With patient Time For Goal Achievement: 09/22/22 Potential to Achieve Goals: Good Progress towards PT goals: Progressing toward goals    Frequency    7X/week      PT Plan      Co-evaluation              AM-PAC PT "6 Clicks" Mobility   Outcome Measure  Help needed turning from your back to your side while in a flat bed without using bedrails?: A Little Help needed moving from lying on your back to sitting on the  side of a flat bed without using bedrails?: A Little Help needed moving to and from a bed to a chair (including a wheelchair)?: A Little Help needed standing up from a chair using your arms (e.g., wheelchair or bedside chair)?: A Little Help needed to walk in hospital room?: A Little Help needed climbing 3-5 steps with a railing? : A Lot 6 Click Score: 17    End of Session Equipment Utilized During Treatment: Gait belt;Right knee immobilizer Activity Tolerance: Patient limited by fatigue Patient left: in bed;with call bell/phone within reach;with bed alarm set Nurse Communication: Mobility status PT Visit Diagnosis: Difficulty in walking, not elsewhere classified (R26.2);Muscle weakness (generalized) (M62.81)     Time: 1610-9604 PT Time Calculation (min) (ACUTE ONLY): 25 min  Charges:    $Gait Training: 23-37 mins PT General Charges $$ ACUTE PT VISIT: 1 Visit                     Mauro Kaufmann PT Acute Rehabilitation Services Pager [phone] Office [phone]    Crystal Jackson 09/16/2022, 4:27 PM

## 2022-09-17 ENCOUNTER — Encounter (HOSPITAL_COMMUNITY): Payer: Self-pay | Admitting: Orthopaedic Surgery

## 2022-09-17 MED ORDER — OXYCODONE HCL 5 MG PO TABS: 5.0000 mg | ORAL_TABLET | Freq: Four times a day (QID) | ORAL | 0 refills | Status: DC | PRN

## 2022-09-17 MED ORDER — ASPIRIN 81 MG PO CHEW: 81.0000 mg | CHEWABLE_TABLET | Freq: Two times a day (BID) | ORAL | 0 refills | Status: DC

## 2022-09-17 NOTE — TOC Transition Note (Signed)
Transition of Care [facility]) - CM/SW Discharge Note   Patient Details  Name: Crystal Jackson MRN: [id number] Date of Birth: [birth year]  [facility] of [facility]) CM/SW Contact:  Amada Jupiter, LCSW Phone Number: 09/17/2022, 10:24 AM   Clinical Narrative:     Met with pt and confirming need for RW and no DME agency preference - order placed with [employer] for delivery to room today.  HHPT prearranged with [facility].  No further TOC needs.  Final next level of care: Home w Home Health Services Barriers to Discharge: Barriers Resolved   Patient Goals and CMS Choice      Discharge Placement                         Discharge Plan and Services Additional resources added to the After Visit Summary for                  DME Arranged: Walker rolling DME Agency: [employer] Date DME Agency Contacted: 09/17/22 Time DME Agency Contacted: 1023 Representative spoke with at DME Agency: Ian Malkin HH Arranged: PT HH Agency: [facility]        Social Determinants of Health (SDOH) Interventions SDOH Screenings   Food Insecurity: No Food Insecurity (09/14/2022)  Housing: Low Risk  (09/14/2022)  Transportation Needs: No Transportation Needs (09/14/2022)  Utilities: Not At Risk (09/14/2022)  Depression (PHQ2-9): Medium Risk (07/09/2018)  Tobacco Use: Low Risk  (09/14/2022)     Readmission Risk Interventions    09/17/2022   10:23 AM  Readmission Risk Prevention Plan  Post Dischage Appt Complete  Medication Screening Complete  Transportation Screening Complete

## 2022-09-17 NOTE — Plan of Care (Signed)
Discharge instructions given to the patient including medications and follow up.  

## 2022-09-17 NOTE — Progress Notes (Signed)
Physical Therapy Treatment Patient Details Name: Crystal Jackson MRN: [id number] DOB: [birth year] Today's Date: 09/17/2022   History of Present Illness Pt s/p R TKR and with hx of DM, obesity, DM and vertigo    PT Comments  Pt is progressing very well this session, meeting goals; instructed pt in use of gait belt to self assist if her husband is unable to assist with RLE lowering off bed/recliner at home.  Pt amb ~45' with RW and supervision for safety. Pt feels ready to d/c home with husband assisting prn and f/u HHPT    If plan is discharge home, recommend the following: A little help with walking and/or transfers;A little help with bathing/dressing/bathroom;Assistance with cooking/housework;Assist for transportation;Help with stairs or ramp for entrance   Can travel by private vehicle        Equipment Recommendations  Rolling walker (2 wheels)    Recommendations for Other Services       Precautions / Restrictions Precautions Precautions: Knee;Fall Restrictions Weight Bearing Restrictions: No RLE Weight Bearing: Weight bearing as tolerated     Mobility  Bed Mobility Overal bed mobility: Needs Assistance Bed Mobility: Supine to Sit     Supine to sit: Min assist     General bed mobility comments: Increased time with use of bed rails and cues for sequence and use of L LE to self assist.    Transfers Overall transfer level: Needs assistance Equipment used: Rolling walker (2 wheels) Transfers: Sit to/from Stand Sit to Stand: Min assist           General transfer comment: cues for hand placement and RLE position,incr time needed and assist to lower RLE    Ambulation/Gait Ambulation/Gait assistance: Supervision, Contact guard assist Gait Distance (Feet): 45 Feet Assistive device: Rolling walker (2 wheels) Gait Pattern/deviations: Step-to pattern, Decreased step length - right, Decreased step length - left, Trunk flexed Gait velocity: decr     General Gait  Details: Increased time with cues for sequence, posture and position from RW.  CGA to supervision for safety   Stairs             Wheelchair Mobility     Tilt Bed    Modified Rankin (Stroke Patients Only)       Balance Overall balance assessment: Needs assistance Sitting-balance support: No upper extremity supported, Feet supported Sitting balance-Leahy Scale: Good     Standing balance support: Single extremity supported, Reliant on assistive device for balance, During functional activity Standing balance-Leahy Scale: Poor                              Cognition Arousal: Alert Behavior During Therapy: WFL for tasks assessed/performed Overall Cognitive Status: Within Functional Limits for tasks assessed                                          Exercises Total Joint Exercises Ankle Circles/Pumps: AROM, Both, 5 reps Quad Sets: AROM, Both, 5 reps Heel Slides: AAROM, Right, 5 reps Straight Leg Raises: AROM, Right, 5 reps    General Comments        Pertinent Vitals/Pain Pain Assessment Pain Assessment: 0-10 Pain Score: 4  Pain Location: R knee Pain Descriptors / Indicators: Aching, Sore    Home Living  Prior Function            PT Goals (current goals can now be found in the care plan section) Acute Rehab PT Goals Patient Stated Goal: Regain IND PT Goal Formulation: With patient Time For Goal Achievement: 09/22/22 Potential to Achieve Goals: Good Progress towards PT goals: Progressing toward goals    Frequency    7X/week      PT Plan      Co-evaluation              AM-PAC PT "6 Clicks" Mobility   Outcome Measure  Help needed turning from your back to your side while in a flat bed without using bedrails?: A Little Help needed moving from lying on your back to sitting on the side of a flat bed without using bedrails?: A Little Help needed moving to and from a bed to a  chair (including a wheelchair)?: A Little Help needed standing up from a chair using your arms (e.g., wheelchair or bedside chair)?: A Little Help needed to walk in hospital room?: A Little Help needed climbing 3-5 steps with a railing? : A Lot 6 Click Score: 17    End of Session Equipment Utilized During Treatment: Gait belt;Right knee immobilizer Activity Tolerance: Patient tolerated treatment well Patient left: with call bell/phone within reach;in chair;with chair alarm set Nurse Communication: Mobility status PT Visit Diagnosis: Difficulty in walking, not elsewhere classified (R26.2);Muscle weakness (generalized) (M62.81)     Time: 1204-1228 PT Time Calculation (min) (ACUTE ONLY): 24 min  Charges:    $Gait Training: 23-37 mins PT General Charges $$ ACUTE PT VISIT: 1 Visit                     Sheera Illingworth, PT  Acute Rehab Dept [facility]) [phone]  09/17/2022    [facility] 09/17/2022, 12:49 PM

## 2022-09-17 NOTE — Progress Notes (Signed)
Patient ID: Crystal Jackson, female   DOB: 08-20-1945, 77 y.o.   MRN: [id number] The patient is awake and alert this morning.  I did change her right knee dressing due to some bloody drainage.  Overall she looks good.  The plan is to discharge her home today.  She agrees with that.  Her vital signs are stable and she is otherwise stable.

## 2022-09-17 NOTE — Discharge Summary (Signed)
Patient ID: Crystal Jackson MRN: [id number] DOB/AGE: 1945-07-21 77 y.o.  Admit date: 09/14/2022 Discharge date: 09/17/2022  Admission Diagnoses:  Principal Problem:   Unilateral primary osteoarthritis, right knee Active Problems:   Status post total right knee replacement   Status post right knee replacement   Discharge Diagnoses:  Same  Past Medical History:  Diagnosis Date   Anxiety    Arthritis    Asthma    Back pain    Bilateral swelling of feet    Constipation    Depression    Diabetes mellitus without complication (HCC)    Gallbladder problem    GERD (gastroesophageal reflux disease)    History of low potassium    Hypertension    Joint pain    Obesity    Palpitations    Shortness of breath    Sleep apnea     Surgeries: Procedure(s): RIGHT TOTAL KNEE ARTHROPLASTY on 09/14/2022   Consultants:   Discharged Condition: Improved  Hospital Course: Crystal Jackson is an 77 y.o. female who was admitted 09/14/2022 for operative treatment ofUnilateral primary osteoarthritis, right knee. Patient has severe unremitting pain that affects sleep, daily activities, and work/hobbies. After pre-op clearance the patient was taken to the operating room on 09/14/2022 and underwent  Procedure(s): RIGHT TOTAL KNEE ARTHROPLASTY.    Patient was given perioperative antibiotics:  Anti-infectives (From admission, onward)    Start     Dose/Rate Route Frequency Ordered Stop   09/14/22 2000  ceFAZolin (ANCEF) IVPB 1 g/50 mL premix        1 g 100 mL/hr over 30 Minutes Intravenous Every 6 hours 09/14/22 1631 09/15/22 0227   09/14/22 1015  ceFAZolin (ANCEF) IVPB 2g/100 mL premix        2 g 200 mL/hr over 30 Minutes Intravenous On call to O.R. 09/14/22 1014 09/14/22 1340        Patient was given sequential compression devices, early ambulation, and chemoprophylaxis to prevent DVT.  Patient benefited maximally from hospital stay and there were no complications.    Recent vital signs:  Patient Vitals for the past 24 hrs:  BP Temp Temp src Pulse Resp SpO2  09/17/22 0446 131/65 98.4 F (36.9 C) Oral 100 17 99 %  09/16/22 2218 (!) 142/59 98.2 F (36.8 C) Oral (!) 110 17 99 %     Recent laboratory studies:  Recent Labs    09/15/22 0358  WBC 11.7*  HGB 11.8*  HCT 36.9  PLT 185  NA 133*  K 4.3  CL 100  CO2 21*  BUN 16  CREATININE 0.84  GLUCOSE 131*  CALCIUM 8.2*     Discharge Medications:   Allergies as of 09/17/2022       Reactions   Avapro [irbesartan] Shortness Of Breath   Dextromethorphan Hives, Shortness Of Breath   Guaifenesin & Derivatives Hives, Shortness Of Breath   Valtrex [valacyclovir] Hives, Shortness Of Breath   Pt states she's not allergic to this medication   Citalopram    Other reaction(s): Chills (intolerance)   Codeine Nausea And Vomiting   Duloxetine    Feel like in a fog   Penicillin G Rash   Vilazodone Nausea Only        Medication List     STOP taking these medications    traMADol 50 MG tablet Commonly known as: ULTRAM       TAKE these medications    albuterol 108 (90 Base) MCG/ACT inhaler Commonly known as: VENTOLIN HFA Inhale 1-2 puffs into  the lungs every 6 (six) hours as needed for wheezing or shortness of breath.   Arnuity Ellipta 200 MCG/ACT Aepb Generic drug: Fluticasone Furoate Inhale 1 puff into the lungs daily.   aspirin 81 MG chewable tablet Chew 1 tablet (81 mg total) by mouth 2 (two) times daily.   b complex vitamins capsule Take 1 capsule by mouth daily.   baclofen 10 MG tablet Commonly known as: LIORESAL Take 1 tablet (10 mg total) by mouth 3 (three) times daily. What changed:  when to take this reasons to take this   BLINK TEARS OP Place 1 drop into both eyes as needed (dry eyes).   Calcium Carbonate-Vitamin D 600-400 MG-UNIT tablet 1 po qd   cetirizine 10 MG tablet Commonly known as: ZYRTEC Take 10 mg by mouth daily.   clotrimazole-betamethasone cream Commonly known as:  LOTRISONE 1 application. daily as needed (eczema).   diclofenac Sodium 1 % Gel Commonly known as: VOLTAREN Apply 2 g topically daily as needed (pain).   DULoxetine 30 MG capsule Commonly known as: CYMBALTA Take 30 mg by mouth in the morning, at noon, and at bedtime.   Fish Oil 500 MG Caps Take 500 mg by mouth daily.   gabapentin 300 MG capsule Commonly known as: NEURONTIN Take 300 mg by mouth 2 (two) times daily.   metFORMIN 500 MG tablet Commonly known as: GLUCOPHAGE 1 po with lunch and dinner   metoprolol tartrate 25 MG tablet Commonly known as: LOPRESSOR Take 50 mg by mouth 2 (two) times daily.   multivitamin tablet Take 1 tablet by mouth daily.   NON FORMULARY Pt uses a cpap nightly   oxyCODONE 5 MG immediate release tablet Commonly known as: Oxy IR/ROXICODONE Take 1-2 tablets (5-10 mg total) by mouth every 6 (six) hours as needed for moderate pain (pain score 4-6).   pantoprazole 40 MG tablet Commonly known as: PROTONIX Take 40 mg by mouth 2 (two) times daily.   spironolactone 25 MG tablet Commonly known as: ALDACTONE Take 12.5 mg by mouth daily.   traZODone 100 MG tablet Commonly known as: DESYREL Take 100 mg by mouth at bedtime.   triamcinolone cream 0.1 % Commonly known as: KENALOG Apply 1 application. topically daily as needed (itching).   Xylitol 500 MG Disk Use as directed 1 tablet in the mouth or throat as needed (as needed for dry mouth).               Durable Medical Equipment  (From admission, onward)           Start     Ordered   09/14/22 1632  DME 3 n 1  Once        09/14/22 1631   09/14/22 1632  DME Walker rolling  Once       Question Answer Comment  Walker: With 5 Inch Wheels   Patient needs a walker to treat with the following condition Status post total right knee replacement      09/14/22 1631            Diagnostic Studies: DG Knee Right Port  Result Date: 09/14/2022 CLINICAL DATA:  Status post right knee  replacement EXAM: PORTABLE RIGHT KNEE - 2 VIEW COMPARISON:  None Available. FINDINGS: Right knee prosthesis is now seen. Postsurgical changes are noted. No acute bony or soft tissue abnormality is seen. IMPRESSION: No acute abnormality noted. Electronically Signed   By: Alcide Clever M.D.   On: 09/14/2022 20:00   XR C-ARM NO  REPORT  Result Date: 08/29/2022 Please see Notes tab for imaging impression.   Disposition: Discharge disposition: 01-Home or Self Care          Follow-up Information     Kathryne Hitch, MD Follow up in 2 week(s).   Specialty: Orthopedic Surgery Contact information: [address] [place] [address] [phone]                  Signed: Kathryne Hitch 09/17/2022, 2:45 PM

## 2022-09-17 NOTE — Plan of Care (Signed)
  Problem: Pain Management: Goal: Pain level will decrease with appropriate interventions Outcome: Progressing   

## 2022-09-20 ENCOUNTER — Other Ambulatory Visit: Payer: Self-pay | Admitting: Orthopaedic Surgery

## 2022-09-20 ENCOUNTER — Encounter: Payer: Self-pay | Admitting: Orthopaedic Surgery

## 2022-09-20 MED ORDER — BACLOFEN 10 MG PO TABS: 10.0000 mg | ORAL_TABLET | Freq: Three times a day (TID) | ORAL | 0 refills | Status: DC | PRN

## 2022-09-21 ENCOUNTER — Other Ambulatory Visit: Payer: Self-pay | Admitting: Orthopaedic Surgery

## 2022-09-21 ENCOUNTER — Telehealth: Payer: Self-pay | Admitting: Orthopaedic Surgery

## 2022-09-21 MED ORDER — OXYCODONE HCL 5 MG PO TABS: 5.0000 mg | ORAL_TABLET | Freq: Four times a day (QID) | ORAL | 0 refills | Status: DC | PRN

## 2022-09-21 NOTE — Telephone Encounter (Signed)
Called and advised pt this was sent in this morning

## 2022-09-21 NOTE — Telephone Encounter (Signed)
Pt called requesting a refill of oxycodone. Please send to [facility]. Pt phone number is [phone].

## 2022-09-25 ENCOUNTER — Other Ambulatory Visit: Payer: Self-pay | Admitting: Orthopaedic Surgery

## 2022-09-26 MED ORDER — OXYCODONE HCL 5 MG PO TABS: 5.0000 mg | ORAL_TABLET | Freq: Four times a day (QID) | ORAL | 0 refills | Status: DC | PRN

## 2022-09-27 ENCOUNTER — Encounter: Payer: Self-pay | Admitting: Orthopaedic Surgery

## 2022-09-27 ENCOUNTER — Ambulatory Visit: Payer: Medicare Other | Admitting: Orthopaedic Surgery

## 2022-09-27 DIAGNOSIS — Z96651 Presence of right artificial knee joint: Secondary | ICD-10-CM

## 2022-09-27 NOTE — Addendum Note (Signed)
Addended by: Mardene Celeste B on: 09/27/2022 04:05 PM   Modules accepted: Orders

## 2022-09-27 NOTE — Progress Notes (Signed)
The patient is here today for first postoperative visit status post a right total knee replacement.  Home therapy has been coming to her house and they plan to come through next week.  We will work on getting her set up for outpatient physical therapy upstairs here hopefully.  Her right knee incision looks good.  The staples are removed and Steri-Strips applied.  Her extension is getting close to full and she can flex to about 80 degrees.  Her calf is soft.  She has been compliant with a baby aspirin twice a day.  She has already gotten a refill of pain medication and she knows when she runs low to let us know.  Will work on setting her up for outpatient therapy.  She can stop her aspirin as well.  Will see her back in 4 weeks to see how she is doing overall from a range of motion standpoint but no x-rays are needed.

## 2022-09-30 ENCOUNTER — Other Ambulatory Visit: Payer: Self-pay | Admitting: Orthopaedic Surgery

## 2022-10-01 MED ORDER — OXYCODONE HCL 5 MG PO TABS: 5.0000 mg | ORAL_TABLET | Freq: Four times a day (QID) | ORAL | 0 refills | Status: DC | PRN

## 2022-10-02 ENCOUNTER — Ambulatory Visit: Payer: Medicare Other | Admitting: Physical Therapy

## 2022-10-04 ENCOUNTER — Other Ambulatory Visit: Payer: Self-pay | Admitting: Orthopaedic Surgery

## 2022-10-04 ENCOUNTER — Ambulatory Visit (INDEPENDENT_AMBULATORY_CARE_PROVIDER_SITE_OTHER): Payer: Medicare Other | Admitting: Family Medicine

## 2022-10-04 ENCOUNTER — Encounter: Payer: Self-pay | Admitting: Orthopaedic Surgery

## 2022-10-04 MED ORDER — OXYCODONE HCL 5 MG PO TABS: 5.0000 mg | ORAL_TABLET | Freq: Four times a day (QID) | ORAL | 0 refills | Status: DC | PRN

## 2022-10-08 ENCOUNTER — Other Ambulatory Visit: Payer: Self-pay | Admitting: Orthopaedic Surgery

## 2022-10-08 MED ORDER — OXYCODONE HCL 5 MG PO TABS: 5.0000 mg | ORAL_TABLET | Freq: Four times a day (QID) | ORAL | 0 refills | Status: DC | PRN

## 2022-10-10 ENCOUNTER — Other Ambulatory Visit: Payer: Self-pay | Admitting: Orthopaedic Surgery

## 2022-10-10 MED ORDER — BACLOFEN 10 MG PO TABS: 10.0000 mg | ORAL_TABLET | Freq: Three times a day (TID) | ORAL | 0 refills | Status: DC | PRN

## 2022-10-10 NOTE — Therapy (Signed)
OUTPATIENT PHYSICAL THERAPY EVALUATION   Patient Name: Crystal Jackson MRN: [id number] DOB:Jun 25, 1945, 77 y.o., female Today's Date: 10/10/2022  END OF SESSION:   Past Medical History:  Diagnosis Date   Anxiety    Arthritis    Asthma    Back pain    Bilateral swelling of feet    Constipation    Depression    Diabetes mellitus without complication (HCC)    Gallbladder problem    GERD (gastroesophageal reflux disease)    History of low potassium    Hypertension    Joint pain    Obesity    Palpitations    Shortness of breath    Sleep apnea    Past Surgical History:  Procedure Laterality Date   ABDOMINAL HYSTERECTOMY     APPENDECTOMY     CATARACT EXTRACTION, BILATERAL     CHOLECYSTECTOMY     KNEE SURGERY Left    arthroscopy   SHOULDER SURGERY Right    TOTAL KNEE ARTHROPLASTY Right 09/14/2022   Procedure: RIGHT TOTAL KNEE ARTHROPLASTY;  Surgeon: Kathryne Hitch, MD;  Location: [facility];  Service: Orthopedics;  Laterality: Right;   Patient Active Problem List   Diagnosis Date Noted   Status post right knee replacement 09/16/2022   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/14/2022   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   BMI 40.0-44.9, adult (HCC)-current bmi 37.3 03/20/2022   Morbid obesity (HCC) 01/30/2022   BMI 37.0-37.9, adult 01/30/2022   B12 deficiency due to diet 09/27/2021   SOB (shortness of breath) on exertion 09/27/2021   Chronic vertigo 09/27/2021   Other hyperlipidemia 08/28/2021   Vitamin D deficiency 08/28/2021   Pre-diabetes 06/28/2021   Sepsis (HCC) 05/15/2021   Hypoxia 05/15/2021   Essential hypertension 05/15/2021   DM (diabetes mellitus), type 2 (HCC) 05/15/2021   Hypomagnesemia 05/15/2021   UTI (urinary tract infection) 05/14/2021   Acute exacerbation of chronic low back pain 03/10/2021   OSA on CPAP 09/03/2018   Mild persistent asthma without complication 09/03/2018   Class 3 severe obesity with serious comorbidity and body mass  index (BMI) of 40.0 to 44.9 in adult [facility]) 04/14/2018   Unilateral primary osteoarthritis, left knee 04/14/2018   Asthma in adult, mild intermittent, uncomplicated 02/26/2017   Allergic rhinitis 09/05/2013    PCP: Melida Quitter MD  REFERRING PROVIDER: Kathryne Hitch, MD  REFERRING DIAG: [phone] (ICD-10-CM) - Status post right knee replacement  THERAPY DIAG:  No diagnosis found.  Rationale for Evaluation and Treatment: Rehabilitation  ONSET DATE: Surgery Rt TKA 09/14/2022  SUBJECTIVE:   SUBJECTIVE STATEMENT: Pt came to clinic s/p recent Rt TKA on 09/14/2022  PERTINENT HISTORY: Anxiety, arthritis, back pain, DM, depression, GERD, HTN.   PAIN:  NPRS scale: ***/10 Pain location: Rt knee  Pain description: *** Aggravating factors: *** Relieving factors: ***  PRECAUTIONS: None  WEIGHT BEARING RESTRICTIONS: No  FALLS:  Has patient fallen in last 6 months? No  LIVING ENVIRONMENT: Lives with: {OPRC lives with:25569::"lives with their family"} Lives in: House/apartment Stairs: {opstairs:27293} Has following equipment at home: {Assistive devices:23999}  OCCUPATION: ***  PLOF: Independent  PATIENT GOALS: ***   OBJECTIVE:    PATIENT SURVEYS:  FOTO intake:    predicted:    COGNITION: Overall cognitive status: WFL    SENSATION: No specific testing today  EDEMA:  Localized edema Rt knee visually inspected  MUSCLE LENGTH: No specific testing today  POSTURE:  {posture:25561}  PALPATION: ***  LOWER EXTREMITY ROM:   ROM Right eval Left  eval  Hip flexion    Hip extension    Hip abduction    Hip adduction    Hip internal rotation    Hip external rotation    Knee flexion    Knee extension    Ankle dorsiflexion    Ankle plantarflexion    Ankle inversion    Ankle eversion     (Blank rows = not tested)  LOWER EXTREMITY MMT:  MMT Right eval Left eval  Hip flexion    Hip extension    Hip abduction    Hip adduction    Hip internal  rotation    Hip external rotation    Knee flexion    Knee extension    Ankle dorsiflexion    Ankle plantarflexion    Ankle inversion    Ankle eversion     (Blank rows = not tested)  LOWER EXTREMITY SPECIAL TESTS:  No specific testing today  FUNCTIONAL TESTS:  18 inch chair transfer: Lt SLS: Rt SLS:  GAIT:                                                                                                                                                                         TODAY'S TREATMENT                                                                          DATE: Therex:    HEP instruction/performance c cues for techniques, handout provided.  Trial set performed of each for comprehension and symptom assessment.  See below for exercise list  PATIENT EDUCATION:  Education details: HEP, POC Person educated: Patient Education method: Explanation, Demonstration, Verbal cues, and Handouts Education comprehension: verbalized understanding, returned demonstration, and verbal cues required  HOME EXERCISE PROGRAM: ***  ASSESSMENT:  CLINICAL IMPRESSION: Patient is a 77 y.o. who comes to clinic with complaints of Rt knee pain s/p recent TKA on 09/14/2022 with mobility, strength and movement coordination deficits that impair their ability to perform usual daily and recreational functional activities without increase difficulty/symptoms at this time.  Patient to benefit from skilled PT services to address impairments and limitations to improve to previous level of function without restriction secondary to condition.   OBJECTIVE IMPAIRMENTS: {opptimpairments:25111}.   ACTIVITY LIMITATIONS: {activitylimitations:27494}  PARTICIPATION LIMITATIONS: {participationrestrictions:25113}  PERSONAL FACTORS:  Anxiety, arthritis, back pain, DM, depression, GERD, HTN.   are also affecting patient's functional outcome.   REHAB POTENTIAL: Good  CLINICAL DECISION MAKING:  Stable/uncomplicated  EVALUATION COMPLEXITY: Low   GOALS: Goals reviewed with patient? Yes  SHORT TERM GOALS: (target date for Short term goals are 3 weeks ***)   1.  Patient will demonstrate independent use of home exercise program to maintain progress from in clinic treatments.  Goal status: New  LONG TERM GOALS: (target dates for all long term goals are 10 weeks  *** )   1. Patient will demonstrate/report pain at worst less than or equal to 2/10 to facilitate minimal limitation in daily activity secondary to pain symptoms.  Goal status: New   2. Patient will demonstrate independent use of home exercise program to facilitate ability to maintain/progress functional gains from skilled physical therapy services.  Goal status: New   3. Patient will demonstrate FOTO outcome > or = *** % to indicate reduced disability due to condition.  Goal status: New   4.  Patient will demonstrate Rt LE MMT 5/5 throughout to faciltiate usual transfers, stairs, squatting at [facility] for daily life.   Goal status: New   5.  Patient will demonstrate Rt knee AROM 0-110 deg to facilitate transfers, ambulation, stairs and other movements of daily life at PLOF.  Goal status: New   6.  Patient will demonstrate independent ambulation community distances > 300 ft for community navigation.  Goal status: New   7.  *** Goal Status: New   PLAN:  PT FREQUENCY: 1-2x/week  PT DURATION: 10 weeks  PLANNED INTERVENTIONS: Therapeutic exercises, Therapeutic activity, Neuro Muscular re-education, Balance training, Gait training, Patient/Family education, Joint mobilization, Stair training, DME instructions, Dry Needling, Electrical stimulation, Traction, Cryotherapy, vasopneumatic deviceMoist heat, Taping, Ultrasound, Ionotophoresis 4mg /ml Dexamethasone, and aquatic therapy, Manual therapy.  All included unless contraindicated  PLAN FOR NEXT SESSION: Review HEP knowledge/results.    Chyrel Masson, PT, DPT,  OCS, ATC 10/10/22  8:32 AM

## 2022-10-11 ENCOUNTER — Ambulatory Visit: Payer: Medicare Other | Admitting: Rehabilitative and Restorative Service Providers"

## 2022-10-11 ENCOUNTER — Other Ambulatory Visit: Payer: Self-pay

## 2022-10-11 ENCOUNTER — Encounter: Payer: Self-pay | Admitting: Rehabilitative and Restorative Service Providers"

## 2022-10-11 DIAGNOSIS — M25561 Pain in right knee: Secondary | ICD-10-CM

## 2022-10-11 DIAGNOSIS — G8929 Other chronic pain: Secondary | ICD-10-CM

## 2022-10-11 DIAGNOSIS — M25661 Stiffness of right knee, not elsewhere classified: Secondary | ICD-10-CM

## 2022-10-11 DIAGNOSIS — R600 Localized edema: Secondary | ICD-10-CM

## 2022-10-11 DIAGNOSIS — R262 Difficulty in walking, not elsewhere classified: Secondary | ICD-10-CM

## 2022-10-11 DIAGNOSIS — M6281 Muscle weakness (generalized): Secondary | ICD-10-CM

## 2022-10-12 ENCOUNTER — Other Ambulatory Visit: Payer: Self-pay | Admitting: Orthopaedic Surgery

## 2022-10-12 MED ORDER — OXYCODONE HCL 5 MG PO TABS: 5.0000 mg | ORAL_TABLET | Freq: Four times a day (QID) | ORAL | 0 refills | Status: DC | PRN

## 2022-10-17 ENCOUNTER — Other Ambulatory Visit: Payer: Self-pay | Admitting: Orthopaedic Surgery

## 2022-10-17 MED ORDER — OXYCODONE HCL 5 MG PO TABS: 5.0000 mg | ORAL_TABLET | Freq: Four times a day (QID) | ORAL | 0 refills | Status: DC | PRN

## 2022-10-23 ENCOUNTER — Other Ambulatory Visit: Payer: Self-pay | Admitting: Orthopaedic Surgery

## 2022-10-23 MED ORDER — OXYCODONE HCL 5 MG PO TABS: 5.0000 mg | ORAL_TABLET | Freq: Four times a day (QID) | ORAL | 0 refills | Status: DC | PRN

## 2022-10-24 ENCOUNTER — Ambulatory Visit: Payer: Medicare Other | Admitting: Orthopaedic Surgery

## 2022-10-24 ENCOUNTER — Encounter: Payer: Self-pay | Admitting: Orthopaedic Surgery

## 2022-10-24 DIAGNOSIS — Z96651 Presence of right artificial knee joint: Secondary | ICD-10-CM | POA: Diagnosis not present

## 2022-10-24 NOTE — Therapy (Signed)
OUTPATIENT PHYSICAL THERAPY TREATMENT   Patient Name: Crystal Jackson MRN: [id number] DOB:Jul 10, 1945, 77 y.o., female Today's Date: 10/25/2022  END OF SESSION:  PT End of Session - 10/25/22 1106     Number of Visits 20    Date for PT Re-Evaluation 12/20/22    Authorization Type [employer] Medicare $20 copay    Authorization Time Period 10/11/2022- 12/06/2022    Authorization - Visit Number 2    Authorization - Number of Visits 16    Progress Note Due on Visit 10    PT Start Time 1101    PT Stop Time 1150    PT Time Calculation (min) 49 min    Activity Tolerance Patient tolerated treatment well    Behavior During Therapy WFL for tasks assessed/performed              Past Medical History:  Diagnosis Date   Anxiety    Arthritis    Asthma    Back pain    Bilateral swelling of feet    Constipation    Depression    Diabetes mellitus without complication (HCC)    Gallbladder problem    GERD (gastroesophageal reflux disease)    History of low potassium    Hypertension    Joint pain    Obesity    Palpitations    Shortness of breath    Sleep apnea    Past Surgical History:  Procedure Laterality Date   ABDOMINAL HYSTERECTOMY     APPENDECTOMY     CATARACT EXTRACTION, BILATERAL     CHOLECYSTECTOMY     KNEE SURGERY Left    arthroscopy   SHOULDER SURGERY Right    TOTAL KNEE ARTHROPLASTY Right 09/14/2022   Procedure: RIGHT TOTAL KNEE ARTHROPLASTY;  Surgeon: Kathryne Hitch, MD;  Location: [facility];  Service: Orthopedics;  Laterality: Right;   Patient Active Problem List   Diagnosis Date Noted   Status post right knee replacement 09/16/2022   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/14/2022   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   BMI 40.0-44.9, adult (HCC)-current bmi 37.3 03/20/2022   Morbid obesity (HCC) 01/30/2022   BMI 37.0-37.9, adult 01/30/2022   B12 deficiency due to diet 09/27/2021   SOB (shortness of breath) on exertion 09/27/2021   Chronic vertigo  09/27/2021   Other hyperlipidemia 08/28/2021   Vitamin D deficiency 08/28/2021   Pre-diabetes 06/28/2021   Sepsis (HCC) 05/15/2021   Hypoxia 05/15/2021   Essential hypertension 05/15/2021   DM (diabetes mellitus), type 2 (HCC) 05/15/2021   Hypomagnesemia 05/15/2021   UTI (urinary tract infection) 05/14/2021   Acute exacerbation of chronic low back pain 03/10/2021   OSA on CPAP 09/03/2018   Mild persistent asthma without complication 09/03/2018   Class 3 severe obesity with serious comorbidity and body mass index (BMI) of 40.0 to 44.9 in adult (HCC) 04/14/2018   Unilateral primary osteoarthritis, left knee 04/14/2018   Asthma in adult, mild intermittent, uncomplicated 02/26/2017   Allergic rhinitis 09/05/2013    PCP: Melida Quitter MD  REFERRING PROVIDER: Kathryne Hitch, MD  REFERRING DIAG: [phone] (ICD-10-CM) - Status post right knee replacement  THERAPY DIAG:  Chronic pain of right knee  Muscle weakness (generalized)  Stiffness of right knee, not elsewhere classified  Difficulty in walking, not elsewhere classified  Localized edema  Rationale for Evaluation and Treatment: Rehabilitation  ONSET DATE: Surgery Rt TKA 09/14/2022  SUBJECTIVE:   SUBJECTIVE STATEMENT: Pt indicated doing better with each day it seems.  Still had tirednress.  PERTINENT HISTORY: Anxiety, arthritis, back pain, DM, depression, GERD, HTN.   Seen by clinic in past.   PAIN:  NPRS scale: last few days at worst:  4-5/10 Pain location: Rt knee  Pain description: tightness, "just pain" Aggravating factors: bending, static positioning, prolonged walking/standing Relieving factors: pain medicine, repositioning  PRECAUTIONS: None  WEIGHT BEARING RESTRICTIONS: No  FALLS:  Has patient fallen in last 6 months? No  LIVING ENVIRONMENT: Lives with: lives with their family Lives in: House/apartment Stairs: outside stairs in front about 6 steps with bilateral rails.  No stairs inside.   Has following equipment at home: FWW, SPC  OCCUPATION: Retired  PLOF: Independent,  light garden work, some walking for exercise  PATIENT GOALS: Reduce pain, walk normal.    OBJECTIVE:   PATIENT SURVEYS:  10/11/2022 FOTO intake:  44  predicted:  59  COGNITION: 10/11/2022 Overall cognitive status: WFL    SENSATION: 10/11/2022 No specific testing today  EDEMA:  10/11/2022 Localized edema Rt knee visually inspected  MUSCLE LENGTH: 10/11/2022 No specific testing today  POSTURE:  10/11/2022 Reliant on UE for support.   PALPATION: 10/11/2022 General tenderness around incision/knee.   LOWER EXTREMITY ROM:   ROM Right 10/11/2022 Right 10/11/2022  Hip flexion    Hip extension    Hip abduction    Hip adduction    Hip internal rotation    Hip external rotation    Knee flexion 84 AROM in supine heel slide  with pain  90 AROM in supine heel slide  Knee extension -7 AROM in seated LAQ  PROM in heel slide  0 AROM in seated LAQ  Ankle dorsiflexion    Ankle plantarflexion    Ankle inversion    Ankle eversion     (Blank rows = not tested)  LOWER EXTREMITY MMT:  MMT Right 10/11/2022 Left 10/11/2022  Hip flexion 5/5 5/5  Hip extension    Hip abduction    Hip adduction    Hip internal rotation    Hip external rotation    Knee flexion 3+/5 5/5  Knee extension 2+/5 5/5  Ankle dorsiflexion 5/5 5/5  Ankle plantarflexion    Ankle inversion    Ankle eversion     (Blank rows = not tested)  LOWER EXTREMITY SPECIAL TESTS:  10/11/2022 No specific testing today  FUNCTIONAL TESTS:  10/11/2022 18 inch chair transfer: unable unassisted without UE  Lt SLS:  not tested Rt SLS: unable   GAIT: 10/25/2022:  Ambulated into clinic with use of cane in Lt UE.   10/11/2022 FWW c reduced step length with Lt leg due to reduced stance on Rt/toe off progression.  TODAY'S TREATMENT                                                                          DATE:10/24/2022 Therex: Nustep Lvl 5 8 mins UE/LE Seated LAQ Rt leg 2.5 lbs 2 x 15 c movement with reciprocal movement contralateral leg.  Seated quad set 5 sec hold x 10 Rt leg Seated quad set c SLR 2 x 10 Rt leg (added to HEP c cues for techniques)  Neuro Re-ed Retro step x 15 bilateral c SBA  Manual Seated Rt knee flexion c distraction/IR mobilization c movement with reciprocal movement contralateral leg.   Vasopneumatic  Rt knee medium compression 34 deg 10 mins in elevation  TODAY'S TREATMENT                                                                          DATE:10/11/2022 Therex:    HEP instruction/performance c cues for techniques, handout provided.  Trial set performed of each for comprehension and symptom assessment.  See below for exercise list  Vasopneumatic  Rt knee medium compression 34 deg 10 mins in elevation  PATIENT EDUCATION:  10/25/2022 Education details: HEP update Person educated: Patient Education method: Programmer, multimedia, Demonstration, Verbal cues, and Handouts Education comprehension: verbalized understanding, returned demonstration, and verbal cues required  HOME EXERCISE PROGRAM: Access Code: [email or web address] URL: [email or web address] Date: 10/25/2022 Prepared by: Chyrel Masson  Exercises - Supine Heel Slide  - 3-5 x daily - 7 x weekly - 1 sets - 10 reps - 5 hold - Supine Knee Extension Mobilization with Weight  - 4-5 x daily - 7 x weekly - 1 sets - 1 reps - to tolerance up to 15 mins hold - Seated Long Arc Quad  - 3-5 x daily - 7 x weekly - 1 sets - 5-10 reps - 2 hold - Seated Quad Set  - 3-5 x daily - 7 x weekly - 1 sets - 10 reps - 5 hold - Seated Knee Flexion Extension AAROM with Overpressure (Mirrored)  - 2-3 x daily - 7 x weekly - 1 sets - 5 reps - 10 hold - Seated Straight Leg  Heel Taps  - 1-2 x daily - 7 x weekly - 2-3 sets - 10 reps  ASSESSMENT:  CLINICAL IMPRESSION: Progression noted to [facility] use with good overall control noted.  Continued to emphasize progressive strengthening in HEP to help improve WB acceptance on Rt leg.   Continued gains in mobility to help with movement transfers as well.    OBJECTIVE IMPAIRMENTS: Abnormal gait, decreased activity tolerance, decreased balance, decreased coordination, decreased endurance, decreased mobility, difficulty walking, decreased ROM, decreased strength, hypomobility, increased edema, increased fascial restrictions, impaired perceived functional ability, impaired flexibility, improper body mechanics, and pain.   ACTIVITY LIMITATIONS: carrying, lifting, bending, sitting, standing, squatting, sleeping, stairs, transfers, and locomotion level  PARTICIPATION LIMITATIONS: meal prep, cleaning, laundry, interpersonal relationship, driving, shopping, and community activity  PERSONAL FACTORS:  Anxiety, arthritis,  back pain, DM, depression, GERD, HTN.   are also affecting patient's functional outcome.   REHAB POTENTIAL: Good  CLINICAL DECISION MAKING: Stable/uncomplicated  EVALUATION COMPLEXITY: Low   GOALS: Goals reviewed with patient? Yes  SHORT TERM GOALS: (target date for Short term goals are 3 weeks  11/01/2022)   1.  Patient will demonstrate independent use of home exercise program to maintain progress from in clinic treatments.  Goal status: on going 10/25/2022  LONG TERM GOALS: (target dates for all long term goals are 10 weeks  12/20/2022 )   1. Patient will demonstrate/report pain at worst less than or equal to 2/10 to facilitate minimal limitation in daily activity secondary to pain symptoms.  Goal status: New   2. Patient will demonstrate independent use of home exercise program to facilitate ability to maintain/progress functional gains from skilled physical therapy services.  Goal status: New   3.  Patient will demonstrate FOTO outcome > or = 59 % to indicate reduced disability due to condition.  Goal status: New   4.  Patient will demonstrate Rt LE MMT 5/5 throughout to faciltiate usual transfers, stairs, squatting at [facility] for daily life.   Goal status: New   5.  Patient will demonstrate Rt knee AROM 0-110 deg to facilitate transfers, ambulation, stairs and other movements of daily life at PLOF.  Goal status: New   6.  Patient will demonstrate independent ambulation community distances > 300 ft for community navigation.  Goal status: New   7.  Patient will demonstrate ascending/descending stairs with single rail assist for household entry.  Goal Status: New   PLAN:  PT FREQUENCY: 1-2x/week  PT DURATION: 10 weeks  PLANNED INTERVENTIONS: Therapeutic exercises, Therapeutic activity, Neuro Muscular re-education, Balance training, Gait training, Patient/Family education, Joint mobilization, Stair training, DME instructions, Dry Needling, Electrical stimulation, Traction, Cryotherapy, vasopneumatic deviceMoist heat, Taping, Ultrasound, Ionotophoresis 4mg /ml Dexamethasone, and aquatic therapy, Manual therapy.  All included unless contraindicated  PLAN FOR NEXT SESSION: Mobility gains flexion.  Progressive quad strengthening.  Driving pressure into bosu with reactive blazepods for driving simulation.    Chyrel Masson, PT, DPT, OCS, ATC 10/25/22  11:41 AM    Date of referral: 09/27/2022 Referring provider: Kathryne Hitch, MD Referring diagnosis? Z61.096 (ICD-10-CM) - Status post right knee replacement Treatment diagnosis? (if different than referring diagnosis)  M25.561  What was this (referring dx) caused by? Surgery (Type: RT TKA 09/14/2022)  Ashby Dawes of Condition: Chronic (continuous duration > 3 months)   Laterality: Rt  Current Functional Measure Score: FOTO 44  Objective measurements identify impairments when they are compared to normal values, the uninvolved  extremity, and prior level of function.  [x]  Yes  []  No  Objective assessment of functional ability: Severe functional limitations   Briefly describe symptoms: Pt indicated having pain complaints consistently with sometimes bad.  Pt indicated having constipation at times with medicine.   Reported sleeping "ok" with some waking due to symptoms at times.  Still taking pain medicine at this time.  Reported symptoms as tightness/pain. Difficulty in WB activity including walking.   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   Average pain intensity:  Last 24 hours: 8-9/10 at worst  Past week:  8-9/10 at worst  How often does the pt experience symptoms? Constantly  How much have the symptoms interfered with usual daily activities? Extremely  How has condition changed since care began at this facility? NA - initial visit  In general, how is the  patients overall health? Good   BACK PAIN (STarT Back Screening Tool) No

## 2022-10-24 NOTE — Progress Notes (Signed)
The patient is now 6 weeks status post a right total knee arthroplasty.  She is ambulating with a cane.  She is 77 years old.  She is finally starting outpatient physical therapy soon.  She has had home therapy at least.  I am very pleased with her extension.  It is full with her right operative knee.  Her incision looks okay.  There is some swelling around her knee to be expected and some warmth but there is no redness.  Her flexion is just past 90 degrees for me today.  Some of her flexion is limited by her body habitus but overall though I really like the progress that she has made thus far.  I do feel that outpatient PT will help her progress even further.  She we will proceed with continued physical therapy on her right operative knee.  I did refill her pain medications recently and she knows to start trying to wean from this.  We will see her back in 4 weeks to see how she is doing overall and I would like a standing AP and lateral of her right operative knee at that visit.

## 2022-10-25 ENCOUNTER — Encounter: Payer: Self-pay | Admitting: Rehabilitative and Restorative Service Providers"

## 2022-10-25 ENCOUNTER — Ambulatory Visit: Payer: Medicare Other | Admitting: Rehabilitative and Restorative Service Providers"

## 2022-10-25 DIAGNOSIS — R262 Difficulty in walking, not elsewhere classified: Secondary | ICD-10-CM | POA: Diagnosis not present

## 2022-10-25 DIAGNOSIS — M25561 Pain in right knee: Secondary | ICD-10-CM | POA: Diagnosis not present

## 2022-10-25 DIAGNOSIS — M6281 Muscle weakness (generalized): Secondary | ICD-10-CM

## 2022-10-25 DIAGNOSIS — G8929 Other chronic pain: Secondary | ICD-10-CM

## 2022-10-25 DIAGNOSIS — R600 Localized edema: Secondary | ICD-10-CM

## 2022-10-25 DIAGNOSIS — M25661 Stiffness of right knee, not elsewhere classified: Secondary | ICD-10-CM | POA: Diagnosis not present

## 2022-10-26 ENCOUNTER — Encounter: Payer: Medicare Other | Admitting: Physical Therapy

## 2022-10-29 ENCOUNTER — Telehealth: Payer: Self-pay | Admitting: Pulmonary Disease

## 2022-10-29 NOTE — Telephone Encounter (Signed)
Patient states has new company for CPAP machine. Patient now has to use [employer]. [employer] needs order, office notes and sleep study results. Patient phone number is [phone].

## 2022-10-31 ENCOUNTER — Encounter: Payer: Medicare Other | Admitting: Rehabilitative and Restorative Service Providers"

## 2022-11-01 ENCOUNTER — Other Ambulatory Visit: Payer: Self-pay | Admitting: Orthopaedic Surgery

## 2022-11-01 MED ORDER — OXYCODONE HCL 5 MG PO TABS: 5.0000 mg | ORAL_TABLET | Freq: Four times a day (QID) | ORAL | 0 refills | Status: DC | PRN

## 2022-11-02 ENCOUNTER — Encounter: Payer: Medicare Other | Admitting: Rehabilitative and Restorative Service Providers"

## 2022-11-05 NOTE — Telephone Encounter (Signed)
I have faxed the referral, notes, sleep study to [facility]. Patient may need to have new office visit.  Last seen 10/03/2021

## 2022-11-06 NOTE — Telephone Encounter (Signed)
I spoke with Florentina Addison with [employer] and she confirmed that she has this order

## 2022-11-07 ENCOUNTER — Ambulatory Visit: Payer: Medicare Other | Admitting: Rehabilitative and Restorative Service Providers"

## 2022-11-07 ENCOUNTER — Encounter: Payer: Self-pay | Admitting: Rehabilitative and Restorative Service Providers"

## 2022-11-07 DIAGNOSIS — M6281 Muscle weakness (generalized): Secondary | ICD-10-CM

## 2022-11-07 DIAGNOSIS — M25661 Stiffness of right knee, not elsewhere classified: Secondary | ICD-10-CM | POA: Diagnosis not present

## 2022-11-07 DIAGNOSIS — R600 Localized edema: Secondary | ICD-10-CM

## 2022-11-07 DIAGNOSIS — M25561 Pain in right knee: Secondary | ICD-10-CM | POA: Diagnosis not present

## 2022-11-07 DIAGNOSIS — G8929 Other chronic pain: Secondary | ICD-10-CM

## 2022-11-07 DIAGNOSIS — R262 Difficulty in walking, not elsewhere classified: Secondary | ICD-10-CM

## 2022-11-07 NOTE — Therapy (Signed)
OUTPATIENT PHYSICAL THERAPY TREATMENT   Patient Name: Crystal Jackson MRN: [id number] DOB:1945-09-09, 77 y.o., female Today's Date: 11/07/2022  END OF SESSION:  PT End of Session - 11/07/22 1336     Visit Number 3    Number of Visits 20    Date for PT Re-Evaluation 12/20/22    Authorization Type [employer] $20 copay    Authorization Time Period 10/11/2022- 12/06/2022    Authorization - Number of Visits 16    Progress Note Due on Visit 10    PT Start Time 1336    PT Stop Time 1415    PT Time Calculation (min) 39 min    Activity Tolerance Patient tolerated treatment well    Behavior During Therapy WFL for tasks assessed/performed               Past Medical History:  Diagnosis Date   Anxiety    Arthritis    Asthma    Back pain    Bilateral swelling of feet    Constipation    Depression    Diabetes mellitus without complication (HCC)    Gallbladder problem    GERD (gastroesophageal reflux disease)    History of low potassium    Hypertension    Joint pain    Obesity    Palpitations    Shortness of breath    Sleep apnea    Past Surgical History:  Procedure Laterality Date   ABDOMINAL HYSTERECTOMY     APPENDECTOMY     CATARACT EXTRACTION, BILATERAL     CHOLECYSTECTOMY     KNEE SURGERY Left    arthroscopy   SHOULDER SURGERY Right    TOTAL KNEE ARTHROPLASTY Right 09/14/2022   Procedure: RIGHT TOTAL KNEE ARTHROPLASTY;  Surgeon: Kathryne Hitch, MD;  Location: [facility];  Service: Orthopedics;  Laterality: Right;   Patient Active Problem List   Diagnosis Date Noted   Status post right knee replacement 09/16/2022   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/14/2022   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   BMI 40.0-44.9, adult (HCC)-current bmi 37.3 03/20/2022   Morbid obesity (HCC) 01/30/2022   BMI 37.0-37.9, adult 01/30/2022   B12 deficiency due to diet 09/27/2021   SOB (shortness of breath) on exertion 09/27/2021   Chronic vertigo 09/27/2021    Other hyperlipidemia 08/28/2021   Vitamin D deficiency 08/28/2021   Pre-diabetes 06/28/2021   Sepsis (HCC) 05/15/2021   Hypoxia 05/15/2021   Essential hypertension 05/15/2021   DM (diabetes mellitus), type 2 (HCC) 05/15/2021   Hypomagnesemia 05/15/2021   UTI (urinary tract infection) 05/14/2021   Acute exacerbation of chronic low back pain 03/10/2021   OSA on CPAP 09/03/2018   Mild persistent asthma without complication 09/03/2018   Class 3 severe obesity with serious comorbidity and body mass index (BMI) of 40.0 to 44.9 in adult (HCC) 04/14/2018   Unilateral primary osteoarthritis, left knee 04/14/2018   Asthma in adult, mild intermittent, uncomplicated 02/26/2017   Allergic rhinitis 09/05/2013    PCP: Melida Quitter MD  REFERRING PROVIDER: Kathryne Hitch, MD  REFERRING DIAG: [phone] (ICD-10-CM) - Status post right knee replacement  THERAPY DIAG:  Chronic pain of right knee  Muscle weakness (generalized)  Stiffness of right knee, not elsewhere classified  Difficulty in walking, not elsewhere classified  Localized edema  Rationale for Evaluation and Treatment: Rehabilitation  ONSET DATE: Surgery Rt TKA 09/14/2022  SUBJECTIVE:   SUBJECTIVE STATEMENT: Reported being sick that limited visits and HEP.    Reported feeling better now.  PERTINENT HISTORY: Anxiety, arthritis, back pain, DM, depression, GERD, HTN.   Seen by clinic in past.   PAIN:  NPRS scale: 2-3/10 upon arrival Pain location: Rt knee  Pain description: tightness, "just pain" Aggravating factors: bending, static positioning, prolonged walking/standing Relieving factors: pain medicine, repositioning  PRECAUTIONS: None  WEIGHT BEARING RESTRICTIONS: No  FALLS:  Has patient fallen in last 6 months? No  LIVING ENVIRONMENT: Lives with: lives with their family Lives in: House/apartment Stairs: outside stairs in front about 6 steps with bilateral rails.  No stairs inside.  Has following  equipment at home: FWW, SPC  OCCUPATION: Retired  PLOF: Independent,  light garden work, some walking for exercise  PATIENT GOALS: Reduce pain, walk normal.    OBJECTIVE:   PATIENT SURVEYS:  10/11/2022 FOTO intake:  44  predicted:  59  COGNITION: 10/11/2022 Overall cognitive status: WFL    SENSATION: 10/11/2022 No specific testing today  EDEMA:  10/11/2022 Localized edema Rt knee visually inspected  MUSCLE LENGTH: 10/11/2022 No specific testing today  POSTURE:  10/11/2022 Reliant on UE for support.   PALPATION: 10/11/2022 General tenderness around incision/knee.   LOWER EXTREMITY ROM:   ROM Right 10/11/2022 Right 10/11/2022 Right 11/07/2022  Hip flexion     Hip extension     Hip abduction     Hip adduction     Hip internal rotation     Hip external rotation     Knee flexion 84 AROM in supine heel slide  with pain  90 AROM in supine heel slide AROM 105 in supine heel slide   Knee extension -7 AROM in seated LAQ  PROM in heel slide  0 AROM in seated LAQ 0 AROM in seated LAQ  Ankle dorsiflexion     Ankle plantarflexion     Ankle inversion     Ankle eversion      (Blank rows = not tested)  LOWER EXTREMITY MMT:  MMT Right 10/11/2022 Left 10/11/2022  Hip flexion 5/5 5/5  Hip extension    Hip abduction    Hip adduction    Hip internal rotation    Hip external rotation    Knee flexion 3+/5 5/5  Knee extension 2+/5 5/5  Ankle dorsiflexion 5/5 5/5  Ankle plantarflexion    Ankle inversion    Ankle eversion     (Blank rows = not tested)  LOWER EXTREMITY SPECIAL TESTS:  10/11/2022 No specific testing today  FUNCTIONAL TESTS:  10/11/2022 18 inch chair transfer: unable unassisted without UE  Lt SLS:  not tested Rt SLS: unable   GAIT: 10/25/2022:  Ambulated into clinic with use of cane in Lt UE.   10/11/2022 FWW c reduced step length with Lt leg due to reduced stance on Rt/toe off progression.  TODAY'S TREATMENT                                                                          DATE:11/07/2022 Therex: Nustep Lvl 5 8 mins UE/LE Incline board gastroc 30 sec x 3 bilateral  Seated Rt knee LAQ end range pause each direction x 15  Seated Rt knee AAROM flexion with Lt leg overpressure 15 sec x 6   Neuro Re-ed Tandem stance on foam in // bars with occasional HHA on bars 1 min x 2 bilateral Tandem ambulation fwd/back in // bars 10 ft x 5 each way with occasional to moderate HHA on bars.   TherActivity Step up forward WB on Rt leg 6 inch step x 15 with single hand assist on bar Leg press Double leg 75 lbs x 15 slow lowering (full range on machine setting), single leg Rt x 15 31 lbs  Vasopneumatic  Rt knee medium compression 34 deg 10 mins in elevation  TODAY'S TREATMENT                                                                          DATE:10/24/2022 Therex: Nustep Lvl 5 8 mins UE/LE Seated LAQ Rt leg 2.5 lbs 2 x 15 c movement with reciprocal movement contralateral leg.  Seated quad set 5 sec hold x 10 Rt leg Seated quad set c SLR 2 x 10 Rt leg (added to HEP c cues for techniques)  Neuro Re-ed Retro step x 15 bilateral c SBA  Manual Seated Rt knee flexion c distraction/IR mobilization c movement with reciprocal movement contralateral leg.   Vasopneumatic  Rt knee medium compression 34 deg 10 mins in elevation  TODAY'S TREATMENT                                                                          DATE:10/11/2022 Therex:    HEP instruction/performance c cues for techniques, handout provided.  Trial set performed of each for comprehension and symptom assessment.  See below for exercise list  Vasopneumatic  Rt knee medium compression 34 deg 10 mins in elevation  PATIENT EDUCATION:  10/25/2022 Education details: HEP update Person  educated: Patient Education method: Programmer, multimedia, Demonstration, Verbal cues, and Handouts Education comprehension: verbalized understanding, returned demonstration, and verbal cues required  HOME EXERCISE PROGRAM: Access Code: [email or web address] URL: [email or web address] Date: 10/25/2022 Prepared by: Chyrel Masson  Exercises - Supine Heel Slide  - 3-5 x daily - 7 x weekly - 1 sets - 10 reps - 5 hold - Supine Knee Extension Mobilization with Weight  - 4-5 x daily - 7 x weekly - 1 sets - 1 reps - to tolerance up to 15 mins hold -  Seated Long Arc Quad  - 3-5 x daily - 7 x weekly - 1 sets - 5-10 reps - 2 hold - Seated Quad Set  - 3-5 x daily - 7 x weekly - 1 sets - 10 reps - 5 hold - Seated Knee Flexion Extension AAROM with Overpressure (Mirrored)  - 2-3 x daily - 7 x weekly - 1 sets - 5 reps - 10 hold - Seated Straight Leg Heel Taps  - 1-2 x daily - 7 x weekly - 2-3 sets - 10 reps  ASSESSMENT:  CLINICAL IMPRESSION: Pt returned today with good management of presentation in time since sick.  Range of motion improved compared to last assessment.  Continued skilled PT services indicated to continue gains in mobility, strength and balance.    OBJECTIVE IMPAIRMENTS: Abnormal gait, decreased activity tolerance, decreased balance, decreased coordination, decreased endurance, decreased mobility, difficulty walking, decreased ROM, decreased strength, hypomobility, increased edema, increased fascial restrictions, impaired perceived functional ability, impaired flexibility, improper body mechanics, and pain.   ACTIVITY LIMITATIONS: carrying, lifting, bending, sitting, standing, squatting, sleeping, stairs, transfers, and locomotion level  PARTICIPATION LIMITATIONS: meal prep, cleaning, laundry, interpersonal relationship, driving, shopping, and community activity  PERSONAL FACTORS:  Anxiety, arthritis, back pain, DM, depression, GERD, HTN.   are also affecting patient's functional outcome.    REHAB POTENTIAL: Good  CLINICAL DECISION MAKING: Stable/uncomplicated  EVALUATION COMPLEXITY: Low   GOALS: Goals reviewed with patient? Yes  SHORT TERM GOALS: (target date for Short term goals are 3 weeks  11/01/2022)   1.  Patient will demonstrate independent use of home exercise program to maintain progress from in clinic treatments.  Goal status: Met  LONG TERM GOALS: (target dates for all long term goals are 10 weeks  12/20/2022 )   1. Patient will demonstrate/report pain at worst less than or equal to 2/10 to facilitate minimal limitation in daily activity secondary to pain symptoms.  Goal status: on going 11/07/2022   2. Patient will demonstrate independent use of home exercise program to facilitate ability to maintain/progress functional gains from skilled physical therapy services.  Goal status:  on going 11/07/2022   3. Patient will demonstrate FOTO outcome > or = 59 % to indicate reduced disability due to condition.  Goal status:  on going 11/07/2022   4.  Patient will demonstrate Rt LE MMT 5/5 throughout to faciltiate usual transfers, stairs, squatting at [facility] for daily life.   Goal status:  on going 11/07/2022   5.  Patient will demonstrate Rt knee AROM 0-110 deg to facilitate transfers, ambulation, stairs and other movements of daily life at PLOF.  Goal status:  on going 11/07/2022   6.  Patient will demonstrate independent ambulation community distances > 300 ft for community navigation.  Goal status:  on going 11/07/2022   7.  Patient will demonstrate ascending/descending stairs with single rail assist for household entry.  Goal Status:  on going 11/07/2022   PLAN:  PT FREQUENCY: 1-2x/week  PT DURATION: 10 weeks  PLANNED INTERVENTIONS: Therapeutic exercises, Therapeutic activity, Neuro Muscular re-education, Balance training, Gait training, Patient/Family education, Joint mobilization, Stair training, DME instructions, Dry Needling, Electrical  stimulation, Traction, Cryotherapy, vasopneumatic deviceMoist heat, Taping, Ultrasound, Ionotophoresis 4mg /ml Dexamethasone, and aquatic therapy, Manual therapy.  All included unless contraindicated  PLAN FOR NEXT SESSION: Balance, strengthening.  FOTO recheck (been about 30 days)   Chyrel Masson, PT, DPT, OCS, ATC 11/07/22  2:14 PM    Date of referral: 09/27/2022 Referring provider: Kathryne Hitch, MD  Referring diagnosis? K91.791 (ICD-10-CM) - Status post right knee replacement Treatment diagnosis? (if different than referring diagnosis)  M25.561  What was this (referring dx) caused by? Surgery (Type: RT TKA 09/14/2022)  Ashby Dawes of Condition: Chronic (continuous duration > 3 months)   Laterality: Rt  Current Functional Measure Score: FOTO 44  Objective measurements identify impairments when they are compared to normal values, the uninvolved extremity, and prior level of function.  [x]  Yes  []  No  Objective assessment of functional ability: Severe functional limitations   Briefly describe symptoms: Pt indicated having pain complaints consistently with sometimes bad.  Pt indicated having constipation at times with medicine.   Reported sleeping "ok" with some waking due to symptoms at times.  Still taking pain medicine at this time.  Reported symptoms as tightness/pain. Difficulty in WB activity including walking.   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   Average pain intensity:  Last 24 hours: 8-9/10 at worst  Past week:  8-9/10 at worst  How often does the pt experience symptoms? Constantly  How much have the symptoms interfered with usual daily activities? Extremely  How has condition changed since care began at this facility? NA - initial visit  In general, how is the patients overall health? Good   BACK PAIN (STarT Back Screening Tool) No

## 2022-11-09 ENCOUNTER — Ambulatory Visit: Payer: Medicare Other | Admitting: Rehabilitative and Restorative Service Providers"

## 2022-11-09 ENCOUNTER — Encounter: Payer: Self-pay | Admitting: Rehabilitative and Restorative Service Providers"

## 2022-11-09 DIAGNOSIS — R262 Difficulty in walking, not elsewhere classified: Secondary | ICD-10-CM

## 2022-11-09 DIAGNOSIS — M25661 Stiffness of right knee, not elsewhere classified: Secondary | ICD-10-CM

## 2022-11-09 DIAGNOSIS — M25561 Pain in right knee: Secondary | ICD-10-CM

## 2022-11-09 DIAGNOSIS — R600 Localized edema: Secondary | ICD-10-CM

## 2022-11-09 DIAGNOSIS — G8929 Other chronic pain: Secondary | ICD-10-CM

## 2022-11-09 DIAGNOSIS — M6281 Muscle weakness (generalized): Secondary | ICD-10-CM | POA: Diagnosis not present

## 2022-11-09 NOTE — Therapy (Signed)
OUTPATIENT PHYSICAL THERAPY TREATMENT   Patient Name: Crystal Jackson MRN: [id number] DOB:1945-03-18, 77 y.o., female Today's Date: 11/09/2022  END OF SESSION:  PT End of Session - 11/09/22 1012     Visit Number 4    Number of Visits 20    Date for PT Re-Evaluation 12/20/22    Authorization Type [employer] $20 copay    Authorization Time Period 10/11/2022- 12/06/2022    Authorization - Number of Visits 16    Progress Note Due on Visit 10    PT Start Time 1009    PT Stop Time 1059    PT Time Calculation (min) 50 min    Activity Tolerance Patient tolerated treatment well    Behavior During Therapy WFL for tasks assessed/performed                Past Medical History:  Diagnosis Date   Anxiety    Arthritis    Asthma    Back pain    Bilateral swelling of feet    Constipation    Depression    Diabetes mellitus without complication (HCC)    Gallbladder problem    GERD (gastroesophageal reflux disease)    History of low potassium    Hypertension    Joint pain    Obesity    Palpitations    Shortness of breath    Sleep apnea    Past Surgical History:  Procedure Laterality Date   ABDOMINAL HYSTERECTOMY     APPENDECTOMY     CATARACT EXTRACTION, BILATERAL     CHOLECYSTECTOMY     KNEE SURGERY Left    arthroscopy   SHOULDER SURGERY Right    TOTAL KNEE ARTHROPLASTY Right 09/14/2022   Procedure: RIGHT TOTAL KNEE ARTHROPLASTY;  Surgeon: Kathryne Hitch, MD;  Location: [facility];  Service: Orthopedics;  Laterality: Right;   Patient Active Problem List   Diagnosis Date Noted   Status post right knee replacement 09/16/2022   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/14/2022   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   BMI 40.0-44.9, adult (HCC)-current bmi 37.3 03/20/2022   Morbid obesity (HCC) 01/30/2022   BMI 37.0-37.9, adult 01/30/2022   B12 deficiency due to diet 09/27/2021   SOB (shortness of breath) on exertion 09/27/2021   Chronic vertigo 09/27/2021    Other hyperlipidemia 08/28/2021   Vitamin D deficiency 08/28/2021   Pre-diabetes 06/28/2021   Sepsis (HCC) 05/15/2021   Hypoxia 05/15/2021   Essential hypertension 05/15/2021   DM (diabetes mellitus), type 2 (HCC) 05/15/2021   Hypomagnesemia 05/15/2021   UTI (urinary tract infection) 05/14/2021   Acute exacerbation of chronic low back pain 03/10/2021   OSA on CPAP 09/03/2018   Mild persistent asthma without complication 09/03/2018   Class 3 severe obesity with serious comorbidity and body mass index (BMI) of 40.0 to 44.9 in adult (HCC) 04/14/2018   Unilateral primary osteoarthritis, left knee 04/14/2018   Asthma in adult, mild intermittent, uncomplicated 02/26/2017   Allergic rhinitis 09/05/2013    PCP: Melida Quitter MD  REFERRING PROVIDER: Kathryne Hitch, MD  REFERRING DIAG: [phone] (ICD-10-CM) - Status post right knee replacement  THERAPY DIAG:  Chronic pain of right knee  Muscle weakness (generalized)  Stiffness of right knee, not elsewhere classified  Difficulty in walking, not elsewhere classified  Localized edema  Rationale for Evaluation and Treatment: Rehabilitation  ONSET DATE: Surgery Rt TKA 09/14/2022  SUBJECTIVE:   SUBJECTIVE STATEMENT: Pt indicated feeling more like herself.  Reported driving today to clinic.  Pt indicated more swelling feel yesterday.  PERTINENT HISTORY: Anxiety, arthritis, back pain, DM, depression, GERD, HTN.   Seen by clinic in past.   PAIN:  NPRS scale: no specific pain upon arrival reported  Pain location: Rt knee  Pain description: tightness, "just pain" Aggravating factors: bending, static positioning, prolonged walking/standing Relieving factors: pain medicine, repositioning  PRECAUTIONS: None  WEIGHT BEARING RESTRICTIONS: No  FALLS:  Has patient fallen in last 6 months? No  LIVING ENVIRONMENT: Lives with: lives with their family Lives in: House/apartment Stairs: outside stairs in front about 6 steps with  bilateral rails.  No stairs inside.  Has following equipment at home: FWW, SPC  OCCUPATION: Retired  PLOF: Independent,  light garden work, some walking for exercise  PATIENT GOALS: Reduce pain, walk normal.    OBJECTIVE:   PATIENT SURVEYS:  11/09/2022: FOTO update:  52  10/11/2022 FOTO intake:  44  predicted:  59  COGNITION: 10/11/2022 Overall cognitive status: WFL    SENSATION: 10/11/2022 No specific testing today  EDEMA:  10/11/2022 Localized edema Rt knee visually inspected  MUSCLE LENGTH: 10/11/2022 No specific testing today  POSTURE:  10/11/2022 Reliant on UE for support.   PALPATION: 10/11/2022 General tenderness around incision/knee.   LOWER EXTREMITY ROM:   ROM Right 10/11/2022 Right 10/11/2022 Right 11/07/2022  Hip flexion     Hip extension     Hip abduction     Hip adduction     Hip internal rotation     Hip external rotation     Knee flexion 84 AROM in supine heel slide  with pain  90 AROM in supine heel slide AROM 105 in supine heel slide   Knee extension -7 AROM in seated LAQ  PROM in heel slide  0 AROM in seated LAQ 0 AROM in seated LAQ  Ankle dorsiflexion     Ankle plantarflexion     Ankle inversion     Ankle eversion      (Blank rows = not tested)  LOWER EXTREMITY MMT:  MMT Right 10/11/2022 Left 10/11/2022  Hip flexion 5/5 5/5  Hip extension    Hip abduction    Hip adduction    Hip internal rotation    Hip external rotation    Knee flexion 3+/5 5/5  Knee extension 2+/5 5/5  Ankle dorsiflexion 5/5 5/5  Ankle plantarflexion    Ankle inversion    Ankle eversion     (Blank rows = not tested)  LOWER EXTREMITY SPECIAL TESTS:  10/11/2022 No specific testing today  FUNCTIONAL TESTS:  10/11/2022 18 inch chair transfer: unable unassisted without UE  Lt SLS:  not tested Rt SLS: unable   GAIT: 10/25/2022:  Ambulated into clinic with use of cane in Lt UE.   10/11/2022 FWW c reduced step length with Lt leg due to  reduced stance on Rt/toe off progression.  TODAY'S TREATMENT                                                                          DATE:11/09/2022 Therex: UBE LE only 8 mins lvl 1.0 for ROM, seat 9 Incline board gastroc 30 sec x 3 bilateral  Seated Rt knee LAQ end range pause each direction 2 x 15 3 lbs  Seated Rt leg quad set 5 sec hold x 10 Seated Rt leg quad set with SLR 2 x 10  Neuro Re-ed SLS with contralateral leg clearing 6 inch hurdle fwd/back with single hand on bar x 10 bilateral  Tandem stance 1 min x 2 bilaterally with occasional HHA on bar  TherActivity Step up on over and down 4 inch step x 4 WB on Rt leg with single hand rail assist - poor control in flexion loading Lateral step up/down WB on Rt leg 2 x 10 with hand rail assist in front   Vasopneumatic  Rt knee medium compression 34 deg 10 mins in elevation  TODAY'S TREATMENT                                                                          DATE:11/07/2022 Therex: Nustep Lvl 5 8 mins UE/LE Incline board gastroc 30 sec x 3 bilateral  Seated Rt knee LAQ end range pause each direction x 15  Seated Rt knee AAROM flexion with Lt leg overpressure 15 sec x 6   Neuro Re-ed Tandem stance on foam in // bars with occasional HHA on bars 1 min x 2 bilateral Tandem ambulation fwd/back in // bars 10 ft x 5 each way with occasional to moderate HHA on bars.   TherActivity Step up forward WB on Rt leg 6 inch step x 15 with single hand assist on bar Leg press Double leg 75 lbs x 15 slow lowering (full range on machine setting), single leg Rt x 15 31 lbs  Vasopneumatic  Rt knee medium compression 34 deg 10 mins in elevation  TODAY'S TREATMENT                                                                          DATE:10/24/2022 Therex: Nustep Lvl 5 8  mins UE/LE Seated LAQ Rt leg 2.5 lbs 2 x 15 c movement with reciprocal movement contralateral leg.  Seated quad set 5 sec hold x 10 Rt leg Seated quad set c SLR 2 x 10 Rt leg (added to HEP c cues for techniques)  Neuro Re-ed Retro step x 15 bilateral c SBA  Manual Seated Rt knee flexion c distraction/IR mobilization c movement with reciprocal movement contralateral leg.   Vasopneumatic  Rt knee medium compression  34 deg 10 mins in elevation   PATIENT EDUCATION:  10/25/2022 Education details: HEP update Person educated: Patient Education method: Programmer, multimedia, Demonstration, Verbal cues, and Handouts Education comprehension: verbalized understanding, returned demonstration, and verbal cues required  HOME EXERCISE PROGRAM: Access Code: [email or web address] URL: [email or web address] Date: 10/25/2022 Prepared by: Chyrel Masson  Exercises - Supine Heel Slide  - 3-5 x daily - 7 x weekly - 1 sets - 10 reps - 5 hold - Supine Knee Extension Mobilization with Weight  - 4-5 x daily - 7 x weekly - 1 sets - 1 reps - to tolerance up to 15 mins hold - Seated Long Arc Quad  - 3-5 x daily - 7 x weekly - 1 sets - 5-10 reps - 2 hold - Seated Quad Set  - 3-5 x daily - 7 x weekly - 1 sets - 10 reps - 5 hold - Seated Knee Flexion Extension AAROM with Overpressure (Mirrored)  - 2-3 x daily - 7 x weekly - 1 sets - 5 reps - 10 hold - Seated Straight Leg Heel Taps  - 1-2 x daily - 7 x weekly - 2-3 sets - 10 reps  ASSESSMENT:  CLINICAL IMPRESSION: Pt to benefit from continued strength gains in eccentric loading to help improve stair navigation and control.  Compensatory movements noted in eccentric forward step down prompting use of lateral step activity to help improve control.  Continued skilled PT services indicated.   OBJECTIVE IMPAIRMENTS: Abnormal gait, decreased activity tolerance, decreased balance, decreased coordination, decreased endurance, decreased mobility, difficulty walking, decreased  ROM, decreased strength, hypomobility, increased edema, increased fascial restrictions, impaired perceived functional ability, impaired flexibility, improper body mechanics, and pain.   ACTIVITY LIMITATIONS: carrying, lifting, bending, sitting, standing, squatting, sleeping, stairs, transfers, and locomotion level  PARTICIPATION LIMITATIONS: meal prep, cleaning, laundry, interpersonal relationship, driving, shopping, and community activity  PERSONAL FACTORS:  Anxiety, arthritis, back pain, DM, depression, GERD, HTN.   are also affecting patient's functional outcome.   REHAB POTENTIAL: Good  CLINICAL DECISION MAKING: Stable/uncomplicated  EVALUATION COMPLEXITY: Low   GOALS: Goals reviewed with patient? Yes  SHORT TERM GOALS: (target date for Short term goals are 3 weeks  11/01/2022)   1.  Patient will demonstrate independent use of home exercise program to maintain progress from in clinic treatments.  Goal status: Met  LONG TERM GOALS: (target dates for all long term goals are 10 weeks  12/20/2022 )   1. Patient will demonstrate/report pain at worst less than or equal to 2/10 to facilitate minimal limitation in daily activity secondary to pain symptoms.  Goal status: on going 11/07/2022   2. Patient will demonstrate independent use of home exercise program to facilitate ability to maintain/progress functional gains from skilled physical therapy services.  Goal status:  on going 11/07/2022   3. Patient will demonstrate FOTO outcome > or = 59 % to indicate reduced disability due to condition.  Goal status:  on going 11/07/2022   4.  Patient will demonstrate Rt LE MMT 5/5 throughout to faciltiate usual transfers, stairs, squatting at [facility] for daily life.   Goal status:  on going 11/07/2022   5.  Patient will demonstrate Rt knee AROM 0-110 deg to facilitate transfers, ambulation, stairs and other movements of daily life at PLOF.  Goal status:  on going 11/07/2022   6.  Patient will  demonstrate independent ambulation community distances > 300 ft for community navigation.  Goal status:  on going 11/07/2022   7.  Patient will demonstrate  ascending/descending stairs with single rail assist for household entry.  Goal Status:  on going 11/07/2022   PLAN:  PT FREQUENCY: 1-2x/week  PT DURATION: 10 weeks  PLANNED INTERVENTIONS: Therapeutic exercises, Therapeutic activity, Neuro Muscular re-education, Balance training, Gait training, Patient/Family education, Joint mobilization, Stair training, DME instructions, Dry Needling, Electrical stimulation, Traction, Cryotherapy, vasopneumatic deviceMoist heat, Taping, Ultrasound, Ionotophoresis 4mg /ml Dexamethasone, and aquatic therapy, Manual therapy.  All included unless contraindicated  PLAN FOR NEXT SESSION: Progressive WB strengthening, balance improvements.    Chyrel Masson, PT, DPT, OCS, ATC 11/09/22  10:49 AM    Date of referral: 09/27/2022 Referring provider: Kathryne Hitch, MD Referring diagnosis? W09.811 (ICD-10-CM) - Status post right knee replacement Treatment diagnosis? (if different than referring diagnosis)  M25.561  What was this (referring dx) caused by? Surgery (Type: RT TKA 09/14/2022)  Ashby Dawes of Condition: Chronic (continuous duration > 3 months)   Laterality: Rt  Current Functional Measure Score: FOTO 44  Objective measurements identify impairments when they are compared to normal values, the uninvolved extremity, and prior level of function.  [x]  Yes  []  No  Objective assessment of functional ability: Severe functional limitations   Briefly describe symptoms: Pt indicated having pain complaints consistently with sometimes bad.  Pt indicated having constipation at times with medicine.   Reported sleeping "ok" with some waking due to symptoms at times.  Still taking pain medicine at this time.  Reported symptoms as tightness/pain. Difficulty in WB activity including walking.   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   Average pain intensity:  Last 24 hours: 8-9/10 at worst  Past week:  8-9/10 at worst  How often does the pt experience symptoms? Constantly  How much have the symptoms interfered with usual daily activities? Extremely  How has condition changed since care began at this facility? NA - initial visit  In general, how is the patients overall health? Good   BACK PAIN (STarT Back Screening Tool) No

## 2022-11-13 ENCOUNTER — Encounter: Payer: Self-pay | Admitting: Physical Therapy

## 2022-11-13 ENCOUNTER — Ambulatory Visit (INDEPENDENT_AMBULATORY_CARE_PROVIDER_SITE_OTHER): Payer: Medicare Other | Admitting: Family Medicine

## 2022-11-13 ENCOUNTER — Ambulatory Visit: Payer: Medicare Other | Admitting: Physical Therapy

## 2022-11-13 DIAGNOSIS — G8929 Other chronic pain: Secondary | ICD-10-CM

## 2022-11-13 DIAGNOSIS — M25661 Stiffness of right knee, not elsewhere classified: Secondary | ICD-10-CM | POA: Diagnosis not present

## 2022-11-13 DIAGNOSIS — M6281 Muscle weakness (generalized): Secondary | ICD-10-CM | POA: Diagnosis not present

## 2022-11-13 DIAGNOSIS — M25561 Pain in right knee: Secondary | ICD-10-CM

## 2022-11-13 DIAGNOSIS — R262 Difficulty in walking, not elsewhere classified: Secondary | ICD-10-CM

## 2022-11-13 NOTE — Therapy (Signed)
OUTPATIENT PHYSICAL THERAPY TREATMENT   Patient Name: Crystal Jackson MRN: [id number] DOB:[birth year], 77 y.o., female Today's Date: 11/13/2022  END OF SESSION:  PT End of Session - 11/13/22 1353     Visit Number 5    Number of Visits 20    Date for PT Re-Evaluation 12/20/22    Authorization Type [employer] $20 copay    Authorization Time Period 10/11/2022- 12/06/2022    Authorization - Number of Visits 16    Progress Note Due on Visit 10    PT Start Time 1348    PT Stop Time 1436    PT Time Calculation (min) 48 min    Activity Tolerance Patient tolerated treatment well    Behavior During Therapy WFL for tasks assessed/performed                 Past Medical History:  Diagnosis Date   Anxiety    Arthritis    Asthma    Back pain    Bilateral swelling of feet    Constipation    Depression    Diabetes mellitus without complication (HCC)    Gallbladder problem    GERD (gastroesophageal reflux disease)    History of low potassium    Hypertension    Joint pain    Obesity    Palpitations    Shortness of breath    Sleep apnea    Past Surgical History:  Procedure Laterality Date   ABDOMINAL HYSTERECTOMY     APPENDECTOMY     CATARACT EXTRACTION, BILATERAL     CHOLECYSTECTOMY     KNEE SURGERY Left    arthroscopy   SHOULDER SURGERY Right    TOTAL KNEE ARTHROPLASTY Right 09/14/2022   Procedure: RIGHT TOTAL KNEE ARTHROPLASTY;  Surgeon: Kathryne Hitch, MD;  Location: [facility];  Service: Orthopedics;  Laterality: Right;   Patient Active Problem List   Diagnosis Date Noted   Status post right knee replacement 09/16/2022   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/14/2022   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   BMI 40.0-44.9, adult (HCC)-current bmi 37.3 03/20/2022   Morbid obesity (HCC) 01/30/2022   BMI 37.0-37.9, adult 01/30/2022   B12 deficiency due to diet 09/27/2021   SOB (shortness of breath) on exertion 09/27/2021   Chronic vertigo 09/27/2021    Other hyperlipidemia 08/28/2021   Vitamin D deficiency 08/28/2021   Pre-diabetes 06/28/2021   Sepsis (HCC) 05/15/2021   Hypoxia 05/15/2021   Essential hypertension 05/15/2021   DM (diabetes mellitus), type 2 (HCC) 05/15/2021   Hypomagnesemia 05/15/2021   UTI (urinary tract infection) 05/14/2021   Acute exacerbation of chronic low back pain 03/10/2021   OSA on CPAP 09/03/2018   Mild persistent asthma without complication 09/03/2018   Class 3 severe obesity with serious comorbidity and body mass index (BMI) of 40.0 to 44.9 in adult [facility]) 04/14/2018   Unilateral primary osteoarthritis, left knee 04/14/2018   Asthma in adult, mild intermittent, uncomplicated 02/26/2017   Allergic rhinitis 09/05/2013    PCP: Melida Quitter MD  REFERRING PROVIDER: Kathryne Hitch, MD  REFERRING DIAG: [id number] (ICD-10-CM) - Status post right knee replacement  THERAPY DIAG:  Chronic pain of right knee  Muscle weakness (generalized)  Stiffness of right knee, not elsewhere classified  Difficulty in walking, not elsewhere classified  Rationale for Evaluation and Treatment: Rehabilitation  ONSET DATE: Surgery Rt TKA 09/14/2022  SUBJECTIVE:   SUBJECTIVE STATEMENT: Pt arriving today reporting 5-6/10 pain in her Rt knee. Pt stating she has been doing  better around her home.   PERTINENT HISTORY: Anxiety, arthritis, back pain, DM, depression, GERD, HTN.   Seen by clinic in past.   PAIN:  NPRS scale: 5-6/10 Pain location: Rt knee  Pain description: tightness, "just pain" Aggravating factors: bending, static positioning, prolonged walking/standing Relieving factors: pain medicine, repositioning  PRECAUTIONS: None  WEIGHT BEARING RESTRICTIONS: No  FALLS:  Has patient fallen in last 6 months? No  LIVING ENVIRONMENT: Lives with: lives with their family Lives in: House/apartment Stairs: outside stairs in front about 6 steps with bilateral rails.  No stairs inside.  Has following  equipment at home: FWW, SPC  OCCUPATION: Retired  PLOF: Independent,  light garden work, some walking for exercise  PATIENT GOALS: Reduce pain, walk normal.    OBJECTIVE:   PATIENT SURVEYS:  11/09/2022: FOTO update:  52  10/11/2022 FOTO intake:  44  predicted:  59  COGNITION: 10/11/2022 Overall cognitive status: WFL    SENSATION: 10/11/2022 No specific testing today  EDEMA:  10/11/2022 Localized edema Rt knee visually inspected  MUSCLE LENGTH: 10/11/2022 No specific testing today  POSTURE:  10/11/2022 Reliant on UE for support.   PALPATION: 10/11/2022 General tenderness around incision/knee.   LOWER EXTREMITY ROM:   ROM Right 10/11/2022 Right 10/11/2022 Right 11/07/2022 Rt 11/13/22  Hip flexion      Hip extension      Hip abduction      Hip adduction      Hip internal rotation      Hip external rotation      Knee flexion 84 AROM in supine heel slide  with pain  90 AROM in supine heel slide AROM 105 in supine heel slide  A: 108 supine  Knee extension -7 AROM in seated LAQ  PROM in heel slide  0 AROM in seated LAQ 0 AROM in seated LAQ A: 0  Ankle dorsiflexion      Ankle plantarflexion      Ankle inversion      Ankle eversion       (Blank rows = not tested)  LOWER EXTREMITY MMT:  MMT Right 10/11/2022 Left 10/11/2022  Hip flexion 5/5 5/5  Hip extension    Hip abduction    Hip adduction    Hip internal rotation    Hip external rotation    Knee flexion 3+/5 5/5  Knee extension 2+/5 5/5  Ankle dorsiflexion 5/5 5/5  Ankle plantarflexion    Ankle inversion    Ankle eversion     (Blank rows = not tested)  LOWER EXTREMITY SPECIAL TESTS:  10/11/2022 No specific testing today  FUNCTIONAL TESTS:  10/11/2022 18 inch chair transfer: unable unassisted without UE  Lt SLS:  not tested Rt SLS: unable   GAIT: 10/25/2022:  Ambulated into clinic with use of cane in Lt UE.   10/11/2022 FWW c reduced step length with Lt leg due to reduced  stance on Rt/toe off progression.  TODAY'S TREATMENT                                                                          DATE:11/13/2022 Therex: Nustep: level 6 x 10 minutes seat 8 Calf stretch on slant board: x 2 holding 30 sec Heel/Toe lifts x 20 c UE support Rt LE on 6 inch step in lunge position rocking forward and back 2 x 30 sec Seated SLR 2 x 10 Rt LE Leg Press: bil 75# 2 x 10, Rt LE only 25# x 10, 31# x 10  NMR:  Stepping over 6 inch hurdle with single UE support x 15  Cone tapping (2 cones placed in front of each LE), alternating c single UE support as needed on TM bar x 20  Modalites:  Vasopneumatic: medium compression, 34 degrees x 10 minutes      TODAY'S TREATMENT                                                                          DATE:11/09/2022 Therex: UBE LE only 8 mins lvl 1.0 for ROM, seat 9 Incline board gastroc 30 sec x 3 bilateral  Seated Rt knee LAQ end range pause each direction 2 x 15 3 lbs  Seated Rt leg quad set 5 sec hold x 10 Seated Rt leg quad set with SLR 2 x 10  Neuro Re-ed SLS with contralateral leg clearing 6 inch hurdle fwd/back with single hand on bar x 10 bilateral  Tandem stance 1 min x 2 bilaterally with occasional HHA on bar  TherActivity Step up on over and down 4 inch step x 4 WB on Rt leg with single hand rail assist - poor control in flexion loading Lateral step up/down WB on Rt leg 2 x 10 with hand rail assist in front   Vasopneumatic  Rt knee medium compression 34 deg 10 mins in elevation  TODAY'S TREATMENT                                                                          DATE:11/07/2022 Therex: Nustep Lvl 5 8 mins UE/LE Incline board gastroc 30 sec x 3 bilateral  Seated Rt knee LAQ end range pause each direction x 15  Seated Rt knee AAROM flexion  with Lt leg overpressure 15 sec x 6   Neuro Re-ed Tandem stance on foam in // bars with occasional HHA on bars 1 min x 2 bilateral Tandem ambulation fwd/back in // bars 10 ft x 5 each way with occasional to moderate HHA on bars.   TherActivity Step up forward WB on Rt leg 6 inch step x 15 with single hand assist on bar Leg press  Double leg 75 lbs x 15 slow lowering (full range on machine setting), single leg Rt x 15 31 lbs  Vasopneumatic  Rt knee medium compression 34 deg 10 mins in elevation  TODAY'S TREATMENT                                                                          DATE:10/24/2022 Therex: Nustep Lvl 5 8 mins UE/LE Seated LAQ Rt leg 2.5 lbs 2 x 15 c movement with reciprocal movement contralateral leg.  Seated quad set 5 sec hold x 10 Rt leg Seated quad set c SLR 2 x 10 Rt leg (added to HEP c cues for techniques)  Neuro Re-ed Retro step x 15 bilateral c SBA  Manual Seated Rt knee flexion c distraction/IR mobilization c movement with reciprocal movement contralateral leg.   Vasopneumatic  Rt knee medium compression 34 deg 10 mins in elevation   PATIENT EDUCATION:  10/25/2022 Education details: HEP update Person educated: Patient Education method: Programmer, multimedia, Demonstration, Verbal cues, and Handouts Education comprehension: verbalized understanding, returned demonstration, and verbal cues required  HOME EXERCISE PROGRAM: Access Code: [email or web address] URL: [email or web address] Date: 10/25/2022 Prepared by: Chyrel Masson  Exercises - Supine Heel Slide  - 3-5 x daily - 7 x weekly - 1 sets - 10 reps - 5 hold - Supine Knee Extension Mobilization with Weight  - 4-5 x daily - 7 x weekly - 1 sets - 1 reps - to tolerance up to 15 mins hold - Seated Long Arc Quad  - 3-5 x daily - 7 x weekly - 1 sets - 5-10 reps - 2 hold - Seated Quad Set  - 3-5 x daily - 7 x weekly - 1 sets - 10 reps - 5 hold - Seated Knee Flexion Extension AAROM with Overpressure  (Mirrored)  - 2-3 x daily - 7 x weekly - 1 sets - 5 reps - 10 hold - Seated Straight Leg Heel Taps  - 1-2 x daily - 7 x weekly - 2-3 sets - 10 reps  ASSESSMENT:  CLINICAL IMPRESSION: Treatment focusing on strengthening and beginning balance. Pt's active Rt knee ROM arc was 0 to 108 degrees. Continue to progress with original plan of treatment toward all goals set.   OBJECTIVE IMPAIRMENTS: Abnormal gait, decreased activity tolerance, decreased balance, decreased coordination, decreased endurance, decreased mobility, difficulty walking, decreased ROM, decreased strength, hypomobility, increased edema, increased fascial restrictions, impaired perceived functional ability, impaired flexibility, improper body mechanics, and pain.   ACTIVITY LIMITATIONS: carrying, lifting, bending, sitting, standing, squatting, sleeping, stairs, transfers, and locomotion level  PARTICIPATION LIMITATIONS: meal prep, cleaning, laundry, interpersonal relationship, driving, shopping, and community activity  PERSONAL FACTORS:  Anxiety, arthritis, back pain, DM, depression, GERD, HTN.   are also affecting patient's functional outcome.   REHAB POTENTIAL: Good  CLINICAL DECISION MAKING: Stable/uncomplicated  EVALUATION COMPLEXITY: Low   GOALS: Goals reviewed with patient? Yes  SHORT TERM GOALS: (target date for Short term goals are 3 weeks  11/01/2022)   1.  Patient will demonstrate independent use of home exercise program to maintain progress from in clinic treatments.  Goal status: Met  LONG TERM GOALS: (target dates for all long term goals are 10 weeks  12/20/2022 )  1. Patient will demonstrate/report pain at worst less than or equal to 2/10 to facilitate minimal limitation in daily activity secondary to pain symptoms.  Goal status: on going 11/13/2022   2. Patient will demonstrate independent use of home exercise program to facilitate ability to maintain/progress functional gains from skilled physical  therapy services.  Goal status:  on going 11/07/2022   3. Patient will demonstrate FOTO outcome > or = 59 % to indicate reduced disability due to condition.  Goal status:  on going 11/07/2022   4.  Patient will demonstrate Rt LE MMT 5/5 throughout to faciltiate usual transfers, stairs, squatting at [facility] for daily life.   Goal status:  on going 11/07/2022   5.  Patient will demonstrate Rt knee AROM 0-110 deg to facilitate transfers, ambulation, stairs and other movements of daily life at PLOF.  Goal status:  on going 11//2024   6.  Patient will demonstrate independent ambulation community distances > 300 ft for community navigation.  Goal status:  on going 11/07/2022   7.  Patient will demonstrate ascending/descending stairs with single rail assist for household entry.  Goal Status:  on going 11/07/2022   PLAN:  PT FREQUENCY: 1-2x/week  PT DURATION: 10 weeks  PLANNED INTERVENTIONS: Therapeutic exercises, Therapeutic activity, Neuro Muscular re-education, Balance training, Gait training, Patient/Family education, Joint mobilization, Stair training, DME instructions, Dry Needling, Electrical stimulation, Traction, Cryotherapy, vasopneumatic deviceMoist heat, Taping, Ultrasound, Ionotophoresis 4mg /ml Dexamethasone, and aquatic therapy, Manual therapy.  All included unless contraindicated  PLAN FOR NEXT SESSION: Progressive WB strengthening, balance improvements.    Narda Amber, PT, MPT 11/13/22 2:34 PM   11/13/22  1:56 PM    Date of referral: 09/27/2022 Referring provider: Kathryne Hitch, MD Referring diagnosis? W10.932 (ICD-10-CM) - Status post right knee replacement Treatment diagnosis? (if different than referring diagnosis)  M25.561  What was this (referring dx) caused by? Surgery (Type: RT TKA 09/14/2022)  Ashby Dawes of Condition: Chronic (continuous duration > 3 months)   Laterality: Rt  Current Functional Measure Score: FOTO 44  Objective measurements  identify impairments when they are compared to normal values, the uninvolved extremity, and prior level of function.  [x]  Yes  []  No  Objective assessment of functional ability: Severe functional limitations   Briefly describe symptoms: Pt indicated having pain complaints consistently with sometimes bad.  Pt indicated having constipation at times with medicine.   Reported sleeping "ok" with some waking due to symptoms at times.  Still taking pain medicine at this time.  Reported symptoms as tightness/pain. Difficulty in WB activity including walking.   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   Average pain intensity:  Last 24 hours: 8-9/10 at worst  Past week:  8-9/10 at worst  How often does the pt experience symptoms? Constantly  How much have the symptoms interfered with usual daily activities? Extremely  How has condition changed since care began at this facility? NA - initial visit  In general, how is the patients overall health? Good   BACK PAIN (STarT Back Screening Tool) No

## 2022-11-16 ENCOUNTER — Encounter: Payer: Self-pay | Admitting: Rehabilitative and Restorative Service Providers"

## 2022-11-16 ENCOUNTER — Ambulatory Visit: Payer: Medicare Other | Admitting: Rehabilitative and Restorative Service Providers"

## 2022-11-16 DIAGNOSIS — M25561 Pain in right knee: Secondary | ICD-10-CM | POA: Diagnosis not present

## 2022-11-16 DIAGNOSIS — M25661 Stiffness of right knee, not elsewhere classified: Secondary | ICD-10-CM

## 2022-11-16 DIAGNOSIS — R262 Difficulty in walking, not elsewhere classified: Secondary | ICD-10-CM

## 2022-11-16 DIAGNOSIS — M6281 Muscle weakness (generalized): Secondary | ICD-10-CM | POA: Diagnosis not present

## 2022-11-16 DIAGNOSIS — R600 Localized edema: Secondary | ICD-10-CM

## 2022-11-16 DIAGNOSIS — G8929 Other chronic pain: Secondary | ICD-10-CM

## 2022-11-16 NOTE — Therapy (Signed)
OUTPATIENT PHYSICAL THERAPY TREATMENT   Patient Name: Crystal Jackson MRN: [id number] DOB:01-03-1945, 77 y.o., female Today's Date: 11/16/2022  END OF SESSION:  PT End of Session - 11/16/22 1013     Visit Number 6    Number of Visits 20    Date for PT Re-Evaluation 12/20/22    Authorization Type [employer] $20 copay    Authorization Time Period 10/11/2022- 12/06/2022    Authorization - Visit Number 6    Authorization - Number of Visits 16    Progress Note Due on Visit 10    PT Start Time 1005    PT Stop Time 1054    PT Time Calculation (min) 49 min    Activity Tolerance Patient tolerated treatment well    Behavior During Therapy WFL for tasks assessed/performed                  Past Medical History:  Diagnosis Date   Anxiety    Arthritis    Asthma    Back pain    Bilateral swelling of feet    Constipation    Depression    Diabetes mellitus without complication (HCC)    Gallbladder problem    GERD (gastroesophageal reflux disease)    History of low potassium    Hypertension    Joint pain    Obesity    Palpitations    Shortness of breath    Sleep apnea    Past Surgical History:  Procedure Laterality Date   ABDOMINAL HYSTERECTOMY     APPENDECTOMY     CATARACT EXTRACTION, BILATERAL     CHOLECYSTECTOMY     KNEE SURGERY Left    arthroscopy   SHOULDER SURGERY Right    TOTAL KNEE ARTHROPLASTY Right 09/14/2022   Procedure: RIGHT TOTAL KNEE ARTHROPLASTY;  Surgeon: Kathryne Hitch, MD;  Location: [facility];  Service: Orthopedics;  Laterality: Right;   Patient Active Problem List   Diagnosis Date Noted   Status post right knee replacement 09/16/2022   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/14/2022   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   BMI 40.0-44.9, adult (HCC)-current bmi 37.3 03/20/2022   Morbid obesity (HCC) 01/30/2022   BMI 37.0-37.9, adult 01/30/2022   B12 deficiency due to diet 09/27/2021   SOB (shortness of breath) on exertion  09/27/2021   Chronic vertigo 09/27/2021   Other hyperlipidemia 08/28/2021   Vitamin D deficiency 08/28/2021   Pre-diabetes 06/28/2021   Sepsis (HCC) 05/15/2021   Hypoxia 05/15/2021   Essential hypertension 05/15/2021   DM (diabetes mellitus), type 2 (HCC) 05/15/2021   Hypomagnesemia 05/15/2021   UTI (urinary tract infection) 05/14/2021   Acute exacerbation of chronic low back pain 03/10/2021   OSA on CPAP 09/03/2018   Mild persistent asthma without complication 09/03/2018   Class 3 severe obesity with serious comorbidity and body mass index (BMI) of 40.0 to 44.9 in adult (HCC) 04/14/2018   Unilateral primary osteoarthritis, left knee 04/14/2018   Asthma in adult, mild intermittent, uncomplicated 02/26/2017   Allergic rhinitis 09/05/2013    PCP: Melida Quitter MD  REFERRING PROVIDER: Kathryne Hitch, MD  REFERRING DIAG: [phone] (ICD-10-CM) - Status post right knee replacement  THERAPY DIAG:  Chronic pain of right knee  Muscle weakness (generalized)  Stiffness of right knee, not elsewhere classified  Difficulty in walking, not elsewhere classified  Localized edema  Rationale for Evaluation and Treatment: Rehabilitation  ONSET DATE: Surgery Rt TKA 09/14/2022  SUBJECTIVE:   SUBJECTIVE STATEMENT: Pt indicated feeling a  bit sluggish today.  Felt some achy with rain yesterday.   PERTINENT HISTORY: Anxiety, arthritis, back pain, DM, depression, GERD, HTN.   Seen by clinic in past.   PAIN:  NPRS scale: 2/10 today Pain location: Rt knee  Pain description: tightness, "just pain" Aggravating factors: bending, static positioning, prolonged walking/standing Relieving factors: pain medicine, repositioning  PRECAUTIONS: None  WEIGHT BEARING RESTRICTIONS: No  FALLS:  Has patient fallen in last 6 months? No  LIVING ENVIRONMENT: Lives with: lives with their family Lives in: House/apartment Stairs: outside stairs in front about 6 steps with bilateral rails.  No  stairs inside.  Has following equipment at home: FWW, SPC  OCCUPATION: Retired  PLOF: Independent,  light garden work, some walking for exercise  PATIENT GOALS: Reduce pain, walk normal.    OBJECTIVE:   PATIENT SURVEYS:  11/09/2022: FOTO update:  52  10/11/2022 FOTO intake:  44  predicted:  59  COGNITION: 10/11/2022 Overall cognitive status: WFL    SENSATION: 10/11/2022 No specific testing today  EDEMA:  10/11/2022 Localized edema Rt knee visually inspected  MUSCLE LENGTH: 10/11/2022 No specific testing today  POSTURE:  10/11/2022 Reliant on UE for support.   PALPATION: 10/11/2022 General tenderness around incision/knee.   LOWER EXTREMITY ROM:   ROM Right 10/11/2022 Right 10/11/2022 Right 11/07/2022 Rt 11/13/22  Hip flexion      Hip extension      Hip abduction      Hip adduction      Hip internal rotation      Hip external rotation      Knee flexion 84 AROM in supine heel slide  with pain  90 AROM in supine heel slide AROM 105 in supine heel slide  A: 108 supine  Knee extension -7 AROM in seated LAQ  PROM in heel slide  0 AROM in seated LAQ 0 AROM in seated LAQ A: 0  Ankle dorsiflexion      Ankle plantarflexion      Ankle inversion      Ankle eversion       (Blank rows = not tested)  LOWER EXTREMITY MMT:  MMT Right 10/11/2022 Left 10/11/2022  Hip flexion 5/5 5/5  Hip extension    Hip abduction    Hip adduction    Hip internal rotation    Hip external rotation    Knee flexion 3+/5 5/5  Knee extension 2+/5 5/5  Ankle dorsiflexion 5/5 5/5  Ankle plantarflexion    Ankle inversion    Ankle eversion     (Blank rows = not tested)  LOWER EXTREMITY SPECIAL TESTS:  10/11/2022 No specific testing today  FUNCTIONAL TESTS:  10/11/2022 18 inch chair transfer: unable unassisted without UE  Lt SLS:  not tested Rt SLS: unable   GAIT: 10/25/2022:  Ambulated into clinic with use of cane in Lt UE.   10/11/2022 FWW c reduced step length  with Lt leg due to reduced stance on Rt/toe off progression.  TODAY'S TREATMENT                                                                          DATE: 11/16/2022 Therex: UBE LE only 8 mins lvl 2.5 for ROM, seat 9 Incline board gastroc 30 sec x 3 bilateral  Supine Rt knee LAQ in 90 deg hip flexion with pause in end range each direction x 15   Neuro Re-ed Tandem stance on foam 1 min x 2 bilateral with occasional HHA on bar Tandem ambulation on ground 10 ft in // bars with occasional HHA on bar fwd/back x 5 each   TherActivity Step up on over and down 4 inch step x 10 WB on Rt leg with single hand rail assist  Lateral step up/down WB on Rt leg x15 with hand rail assist in front   Vasopneumatic  Rt knee medium compression 34 deg 10 mins in elevation  TODAY'S TREATMENT                                                                          DATE:11/13/2022 Therex: Nustep: level 6 x 10 minutes seat 8 Calf stretch on slant board: x 2 holding 30 sec Heel/Toe lifts x 20 c UE support Rt LE on 6 inch step in lunge position rocking forward and back 2 x 30 sec Seated SLR 2 x 10 Rt LE Leg Press: bil 75# 2 x 10, Rt LE only 25# x 10, 31# x 10  NMR:  Stepping over 6 inch hurdle with single UE support x 15  Cone tapping (2 cones placed in front of each LE), alternating c single UE support as needed on TM bar x 20  Modalites:  Vasopneumatic: medium compression, 34 degrees x 10 minutes   TODAY'S TREATMENT                                                                          DATE:11/09/2022 Therex: UBE LE only 8 mins lvl 1.0 for ROM, seat 9 Incline board gastroc 30 sec x 3 bilateral  Seated Rt knee LAQ end range pause each direction 2 x 15 3 lbs  Seated Rt leg quad set 5 sec hold x 10 Seated Rt leg quad set with SLR 2 x  10  Neuro Re-ed SLS with contralateral leg clearing 6 inch hurdle fwd/back with single hand on bar x 10 bilateral  Tandem stance 1 min x 2 bilaterally with occasional HHA on bar  TherActivity Step up on over and down 4 inch step x 4 WB on Rt leg with single hand rail assist - poor control in flexion loading Lateral step up/down WB on Rt leg 2 x  10 with hand rail assist in front   Vasopneumatic  Rt knee medium compression 34 deg 10 mins in elevation   PATIENT EDUCATION:  10/25/2022 Education details: HEP update Person educated: Patient Education method: Programmer, multimedia, Demonstration, Verbal cues, and Handouts Education comprehension: verbalized understanding, returned demonstration, and verbal cues required  HOME EXERCISE PROGRAM: Access Code: [email or web address] URL: [email or web address] Date: 10/25/2022 Prepared by: Chyrel Masson  Exercises - Supine Heel Slide  - 3-5 x daily - 7 x weekly - 1 sets - 10 reps - 5 hold - Supine Knee Extension Mobilization with Weight  - 4-5 x daily - 7 x weekly - 1 sets - 1 reps - to tolerance up to 15 mins hold - Seated Long Arc Quad  - 3-5 x daily - 7 x weekly - 1 sets - 5-10 reps - 2 hold - Seated Quad Set  - 3-5 x daily - 7 x weekly - 1 sets - 10 reps - 5 hold - Seated Knee Flexion Extension AAROM with Overpressure (Mirrored)  - 2-3 x daily - 7 x weekly - 1 sets - 5 reps - 10 hold - Seated Straight Leg Heel Taps  - 1-2 x daily - 7 x weekly - 2-3 sets - 10 reps  ASSESSMENT:  CLINICAL IMPRESSION: Pt continued to show improvement in general balance control.  Strength in WB loaded positioning still lacking which impairs functional stair navigation with reciprocal gait pattern but getting better overall.  Plan to reassess data for updated MD note next visit. Continued skilled PT services indicated at this time.   OBJECTIVE IMPAIRMENTS: Abnormal gait, decreased activity tolerance, decreased balance, decreased coordination, decreased endurance,  decreased mobility, difficulty walking, decreased ROM, decreased strength, hypomobility, increased edema, increased fascial restrictions, impaired perceived functional ability, impaired flexibility, improper body mechanics, and pain.   ACTIVITY LIMITATIONS: carrying, lifting, bending, sitting, standing, squatting, sleeping, stairs, transfers, and locomotion level  PARTICIPATION LIMITATIONS: meal prep, cleaning, laundry, interpersonal relationship, driving, shopping, and community activity  PERSONAL FACTORS:  Anxiety, arthritis, back pain, DM, depression, GERD, HTN.   are also affecting patient's functional outcome.   REHAB POTENTIAL: Good  CLINICAL DECISION MAKING: Stable/uncomplicated  EVALUATION COMPLEXITY: Low   GOALS: Goals reviewed with patient? Yes  SHORT TERM GOALS: (target date for Short term goals are 3 weeks  11/01/2022)   1.  Patient will demonstrate independent use of home exercise program to maintain progress from in clinic treatments.  Goal status: Met  LONG TERM GOALS: (target dates for all long term goals are 10 weeks  12/20/2022 )   1. Patient will demonstrate/report pain at worst less than or equal to 2/10 to facilitate minimal limitation in daily activity secondary to pain symptoms.  Goal status: on going 11/13/2022   2. Patient will demonstrate independent use of home exercise program to facilitate ability to maintain/progress functional gains from skilled physical therapy services.  Goal status:  on going 11/07/2022   3. Patient will demonstrate FOTO outcome > or = 59 % to indicate reduced disability due to condition.  Goal status:  on going 11/07/2022   4.  Patient will demonstrate Rt LE MMT 5/5 throughout to faciltiate usual transfers, stairs, squatting at [facility] for daily life.   Goal status:  on going 11/07/2022   5.  Patient will demonstrate Rt knee AROM 0-110 deg to facilitate transfers, ambulation, stairs and other movements of daily life at PLOF.   Goal status:  on going 11//2024   6.  Patient will demonstrate independent ambulation  community distances > 300 ft for community navigation.  Goal status:  on going 11/07/2022   7.  Patient will demonstrate ascending/descending stairs with single rail assist for household entry.  Goal Status:  on going 11/07/2022   PLAN:  PT FREQUENCY: 1-2x/week  PT DURATION: 10 weeks  PLANNED INTERVENTIONS: Therapeutic exercises, Therapeutic activity, Neuro Muscular re-education, Balance training, Gait training, Patient/Family education, Joint mobilization, Stair training, DME instructions, Dry Needling, Electrical stimulation, Traction, Cryotherapy, vasopneumatic deviceMoist heat, Taping, Ultrasound, Ionotophoresis 4mg /ml Dexamethasone, and aquatic therapy, Manual therapy.  All included unless contraindicated  PLAN FOR NEXT SESSION: MD update note.     Chyrel Masson, PT, DPT, OCS, ATC 11/16/22  10:42 AM      Date of referral: 09/27/2022 Referring provider: Kathryne Hitch, MD Referring diagnosis? W09.811 (ICD-10-CM) - Status post right knee replacement Treatment diagnosis? (if different than referring diagnosis)  M25.561  What was this (referring dx) caused by? Surgery (Type: RT TKA 09/14/2022)  Ashby Dawes of Condition: Chronic (continuous duration > 3 months)   Laterality: Rt  Current Functional Measure Score: FOTO 44  Objective measurements identify impairments when they are compared to normal values, the uninvolved extremity, and prior level of function.  [x]  Yes  []  No  Objective assessment of functional ability: Severe functional limitations   Briefly describe symptoms: Pt indicated having pain complaints consistently with sometimes bad.  Pt indicated having constipation at times with medicine.   Reported sleeping "ok" with some waking due to symptoms at times.  Still taking pain medicine at this time.  Reported symptoms as tightness/pain. Difficulty in WB activity including  walking.   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   Average pain intensity:  Last 24 hours: 8-9/10 at worst  Past week:  8-9/10 at worst  How often does the pt experience symptoms? Constantly  How much have the symptoms interfered with usual daily activities? Extremely  How has condition changed since care began at this facility? NA - initial visit  In general, how is the patients overall health? Good   BACK PAIN (STarT Back Screening Tool) No

## 2022-11-20 ENCOUNTER — Ambulatory Visit: Payer: Medicare Other | Admitting: Rehabilitative and Restorative Service Providers"

## 2022-11-20 ENCOUNTER — Encounter: Payer: Self-pay | Admitting: Rehabilitative and Restorative Service Providers"

## 2022-11-20 DIAGNOSIS — M6281 Muscle weakness (generalized): Secondary | ICD-10-CM | POA: Diagnosis not present

## 2022-11-20 DIAGNOSIS — R600 Localized edema: Secondary | ICD-10-CM

## 2022-11-20 DIAGNOSIS — R262 Difficulty in walking, not elsewhere classified: Secondary | ICD-10-CM

## 2022-11-20 DIAGNOSIS — M25661 Stiffness of right knee, not elsewhere classified: Secondary | ICD-10-CM

## 2022-11-20 DIAGNOSIS — M25561 Pain in right knee: Secondary | ICD-10-CM

## 2022-11-20 DIAGNOSIS — G8929 Other chronic pain: Secondary | ICD-10-CM

## 2022-11-20 NOTE — Therapy (Signed)
OUTPATIENT PHYSICAL THERAPY TREATMENT / PROGRESS NOTE   Patient Name: Crystal Jackson MRN: [id number] DOB:[birth year], 77 y.o., female Today's Date: 11/20/2022  Progress Note Reporting Period 10/11/2022 to 11/20/2022  See note below for Objective Data and Assessment of Progress/Goals.      END OF SESSION:  PT End of Session - 11/20/22 1357     Visit Number 7    Number of Visits 20    Date for PT Re-Evaluation 12/20/22    Authorization Type [employer] $20 copay    Authorization Time Period 10/11/2022- 12/06/2022    Authorization - Visit Number 7    Authorization - Number of Visits 16    Progress Note Due on Visit 10    PT Start Time 1348    PT Stop Time 1427    PT Time Calculation (min) 39 min    Activity Tolerance Patient tolerated treatment well    Behavior During Therapy WFL for tasks assessed/performed               Past Medical History:  Diagnosis Date   Anxiety    Arthritis    Asthma    Back pain    Bilateral swelling of feet    Constipation    Depression    Diabetes mellitus without complication (HCC)    Gallbladder problem    GERD (gastroesophageal reflux disease)    History of low potassium    Hypertension    Joint pain    Obesity    Palpitations    Shortness of breath    Sleep apnea    Past Surgical History:  Procedure Laterality Date   ABDOMINAL HYSTERECTOMY     APPENDECTOMY     CATARACT EXTRACTION, BILATERAL     CHOLECYSTECTOMY     KNEE SURGERY Left    arthroscopy   SHOULDER SURGERY Right    TOTAL KNEE ARTHROPLASTY Right 09/14/2022   Procedure: RIGHT TOTAL KNEE ARTHROPLASTY;  Surgeon: Kathryne Hitch, MD;  Location: [facility];  Service: Orthopedics;  Laterality: Right;   Patient Active Problem List   Diagnosis Date Noted   Status post right knee replacement 09/16/2022   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/14/2022   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   BMI 40.0-44.9, adult (HCC)-current bmi 37.3 03/20/2022   Morbid  obesity (HCC) 01/30/2022   BMI 37.0-37.9, adult 01/30/2022   B12 deficiency due to diet 09/27/2021   SOB (shortness of breath) on exertion 09/27/2021   Chronic vertigo 09/27/2021   Other hyperlipidemia 08/28/2021   Vitamin D deficiency 08/28/2021   Pre-diabetes 06/28/2021   Sepsis (HCC) 05/15/2021   Hypoxia 05/15/2021   Essential hypertension 05/15/2021   DM (diabetes mellitus), type 2 (HCC) 05/15/2021   Hypomagnesemia 05/15/2021   UTI (urinary tract infection) 05/14/2021   Acute exacerbation of chronic low back pain 03/10/2021   OSA on CPAP 09/03/2018   Mild persistent asthma without complication 09/03/2018   Class 3 severe obesity with serious comorbidity and body mass index (BMI) of 40.0 to 44.9 in adult [facility]) 04/14/2018   Unilateral primary osteoarthritis, left knee 04/14/2018   Asthma in adult, mild intermittent, uncomplicated 02/26/2017   Allergic rhinitis 09/05/2013    PCP: Melida Quitter MD  REFERRING PROVIDER: Kathryne Hitch, MD  REFERRING DIAG: [phone] (ICD-10-CM) - Status post right knee replacement  THERAPY DIAG:  Chronic pain of right knee  Muscle weakness (generalized)  Stiffness of right knee, not elsewhere classified  Difficulty in walking, not elsewhere classified  Localized edema  Rationale for Evaluation and Treatment: Rehabilitation  ONSET DATE: Surgery Rt TKA 09/14/2022  SUBJECTIVE:   SUBJECTIVE STATEMENT: Pt indicated feeling some concern about the stability and making sure it bends.   PERTINENT HISTORY: Anxiety, arthritis, back pain, DM, depression, GERD, HTN.   Seen by clinic in past.   PAIN:  NPRS scale: no specific pain number Pain location: Rt knee  Pain description: tightness Aggravating factors: bending, static positioning, prolonged walking/standing Relieving factors: pain medicine, repositioning  PRECAUTIONS: None  WEIGHT BEARING RESTRICTIONS: No  FALLS:  Has patient fallen in last 6 months? No  LIVING  ENVIRONMENT: Lives with: lives with their family Lives in: House/apartment Stairs: outside stairs in front about 6 steps with bilateral rails.  No stairs inside.  Has following equipment at home: FWW, SPC  OCCUPATION: Retired  PLOF: Independent,  light garden work, some walking for exercise  PATIENT GOALS: Reduce pain, walk normal.    OBJECTIVE:   PATIENT SURVEYS:  11/09/2022: FOTO update:  52  10/11/2022 FOTO intake:  44  predicted:  59  COGNITION: 10/11/2022 Overall cognitive status: WFL    SENSATION: 10/11/2022 No specific testing today  EDEMA:  10/11/2022 Localized edema Rt knee visually inspected  MUSCLE LENGTH: 10/11/2022 No specific testing today  POSTURE:  10/11/2022 Reliant on UE for support.   PALPATION: 10/11/2022 General tenderness around incision/knee.   LOWER EXTREMITY ROM:   ROM Right 10/11/2022 Right 10/11/2022 Right 11/07/2022 Rt 11/13/22 Right 11/20/2022  Hip flexion       Hip extension       Hip abduction       Hip adduction       Hip internal rotation       Hip external rotation       Knee flexion 84 AROM in supine heel slide  with pain  90 AROM in supine heel slide AROM 105 in supine heel slide  A: 108 supine 110 AROM in supine heel slide  Knee extension -7 AROM in seated LAQ  PROM in heel slide  0 AROM in seated LAQ 0 AROM in seated LAQ A: 0 0 AROM in seated LAQ  Ankle dorsiflexion       Ankle plantarflexion       Ankle inversion       Ankle eversion        (Blank rows = not tested)  LOWER EXTREMITY MMT:  MMT Right 10/11/2022 Left 10/11/2022 Right 11/20/2022 Left 11/20/2022  Hip flexion 5/5 5/5 5/5 5/5  Hip extension      Hip abduction      Hip adduction      Hip internal rotation      Hip external rotation      Knee flexion 3+/5 5/5 5/5   Knee extension 2+/5 5/5 4+/5   Ankle dorsiflexion 5/5 5/5 5/5   Ankle plantarflexion      Ankle inversion      Ankle eversion       (Blank rows = not tested)  LOWER  EXTREMITY SPECIAL TESTS:  10/11/2022 No specific testing today  FUNCTIONAL TESTS:  11/20/2022:  Lt SLS: unable  Rt SLS: 2-3 seconds on several tries  10/11/2022 18 inch chair transfer: unable unassisted without UE  Lt SLS:  not tested Rt SLS: unable   GAIT: 11/20/2022 Able to ambulate in clinic with independence.   10/25/2022:  Ambulated into clinic with use of cane in Lt UE.   10/11/2022 FWW c reduced step length with Lt leg due to reduced stance  on Rt/toe off progression.                                                                                                                                                                         TODAY'S TREATMENT                                                                          DATE: 11/20/2022 Therex: Recumbent bike, ROM seat 5 lvl 1 8 mins  Incline board gastroc 30 sec x 3 bilateral  Supine Rt knee LAQ in 90 deg hip flexion with pause in end range each direction x 15   Neuro Re-ed Tandem stance on foam 1 min x 1 bilateral with occasional HHA on bar Tandem ambulation on foam 6 ft in // bars with occasional HHA on bar fwd/back x 6 each  SLS on black mat with contralateral touch each corner x 8 each , performed bilaterally with occasional HHA  Alternating heel/toe raises with minimal HHA on bar x 20 each direction for ankle strategy imprrovement  TherActivity Forward step up 6 inch step Rt leg WB x 15 Lateral step up/down 4 inch step WB on Rt leg x15 with hand rail assist in front  Leg press 31 lbs 2 x 15 Rt leg only    TODAY'S TREATMENT                                                                          DATE: 11/16/2022 Therex: UBE LE only 8 mins lvl 2.5 for ROM, seat 9 Supine Rt leg heel slide AROM 5 sec hold x 10  Neuro Re-ed Tandem stance on foam 1 min x 2 bilateral with occasional HHA on bar Tandem ambulation on ground 10 ft in // bars with occasional HHA on bar fwd/back x 5 each   TherActivity Step up on  over and down 4 inch step x 10 WB on Rt leg with single hand rail assist  Lateral step up/down WB on Rt leg x15 with hand rail assist in front  Leg press Rt leg only 31 lbs 2 x 15  Vasopneumatic  Rt knee medium compression 34 deg 10 mins in  elevation  TODAY'S TREATMENT                                                                          DATE:11/13/2022 Therex: Nustep: level 6 x 10 minutes seat 8 Calf stretch on slant board: x 2 holding 30 sec Heel/Toe lifts x 20 c UE support Rt LE on 6 inch step in lunge position rocking forward and back 2 x 30 sec Seated SLR 2 x 10 Rt LE Leg Press: bil 75# 2 x 10, Rt LE only 25# x 10, 31# x 10  NMR:  Stepping over 6 inch hurdle with single UE support x 15  Cone tapping (2 cones placed in front of each LE), alternating c single UE support as needed on TM bar x 20  Modalites:  Vasopneumatic: medium compression, 34 degrees x 10 minutes   PATIENT EDUCATION:  10/25/2022 Education details: HEP update Person educated: Patient Education method: Programmer, multimedia, Demonstration, Verbal cues, and Handouts Education comprehension: verbalized understanding, returned demonstration, and verbal cues required  HOME EXERCISE PROGRAM: Access Code: [email or web address] URL: [email or web address] Date: 10/25/2022 Prepared by: Chyrel Masson  Exercises - Supine Heel Slide  - 3-5 x daily - 7 x weekly - 1 sets - 10 reps - 5 hold - Supine Knee Extension Mobilization with Weight  - 4-5 x daily - 7 x weekly - 1 sets - 1 reps - to tolerance up to 15 mins hold - Seated Long Arc Quad  - 3-5 x daily - 7 x weekly - 1 sets - 5-10 reps - 2 hold - Seated Quad Set  - 3-5 x daily - 7 x weekly - 1 sets - 10 reps - 5 hold - Seated Knee Flexion Extension AAROM with Overpressure (Mirrored)  - 2-3 x daily - 7 x weekly - 1 sets - 5 reps - 10 hold - Seated Straight Leg Heel Taps  - 1-2 x daily - 7 x weekly - 2-3 sets - 10 reps  ASSESSMENT:  CLINICAL IMPRESSION: Pt has attended 7  visits overall during course of treatment.  See objective data for updated information regarding current presentation.  Gains overall noted in all areas as noted.  Able to demonstrate independent ambulation in clinic with [facility] use in public but little reliance noted.  Pt may continue to benefit from skilled PT services to continue progression towards goals and long term HEP transitioning.   OBJECTIVE IMPAIRMENTS: Abnormal gait, decreased activity tolerance, decreased balance, decreased coordination, decreased endurance, decreased mobility, difficulty walking, decreased ROM, decreased strength, hypomobility, increased edema, increased fascial restrictions, impaired perceived functional ability, impaired flexibility, improper body mechanics, and pain.   ACTIVITY LIMITATIONS: carrying, lifting, bending, sitting, standing, squatting, sleeping, stairs, transfers, and locomotion level  PARTICIPATION LIMITATIONS: meal prep, cleaning, laundry, interpersonal relationship, driving, shopping, and community activity  PERSONAL FACTORS:  Anxiety, arthritis, back pain, DM, depression, GERD, HTN.   are also affecting patient's functional outcome.   REHAB POTENTIAL: Good  CLINICAL DECISION MAKING: Stable/uncomplicated  EVALUATION COMPLEXITY: Low   GOALS: Goals reviewed with patient? Yes  SHORT TERM GOALS: (target date for Short term goals are 3 weeks  11/01/2022)   1.  Patient will demonstrate independent use of home exercise program to maintain progress  from in clinic treatments.  Goal status: Met  LONG TERM GOALS: (target dates for all long term goals are 10 weeks  12/20/2022 )   1. Patient will demonstrate/report pain at worst less than or equal to 2/10 to facilitate minimal limitation in daily activity secondary to pain symptoms.  Goal status: on going 11/20/2022   2. Patient will demonstrate independent use of home exercise program to facilitate ability to maintain/progress functional gains from  skilled physical therapy services.  Goal status:  on going 11/20/2022   3. Patient will demonstrate FOTO outcome > or = 59 % to indicate reduced disability due to condition.  Goal status:  on going 11/20/2022   4.  Patient will demonstrate Rt LE MMT 5/5 throughout to faciltiate usual transfers, stairs, squatting at [facility] for daily life.   Goal status:  on going 11/20/2022   5.  Patient will demonstrate Rt knee AROM 0-110 deg to facilitate transfers, ambulation, stairs and other movements of daily life at PLOF.  Goal status:  on going 11//11/20/2022   6.  Patient will demonstrate independent ambulation community distances > 300 ft for community navigation.  Goal status:  on going (in clinic met) 11/20/2022   7.  Patient will demonstrate ascending/descending stairs with single rail assist for household entry.  Goal Status:  on going 11/20/2022   PLAN:  PT FREQUENCY: 1-2x/week  PT DURATION: 10 weeks  PLANNED INTERVENTIONS: Therapeutic exercises, Therapeutic activity, Neuro Muscular re-education, Balance training, Gait training, Patient/Family education, Joint mobilization, Stair training, DME instructions, Dry Needling, Electrical stimulation, Traction, Cryotherapy, vasopneumatic deviceMoist heat, Taping, Ultrasound, Ionotophoresis 4mg /ml Dexamethasone, and aquatic therapy, Manual therapy.  All included unless contraindicated  PLAN FOR NEXT SESSION:  Progressive strengthening and balance improvements continued.    Chyrel Masson, PT, DPT, OCS, ATC 11/20/22  2:25 PM       Date of referral: 09/27/2022 Referring provider: Kathryne Hitch, MD Referring diagnosis? G29.528 (ICD-10-CM) - Status post right knee replacement Treatment diagnosis? (if different than referring diagnosis)  M25.561  What was this (referring dx) caused by? Surgery (Type: RT TKA 09/14/2022)  Ashby Dawes of Condition: Chronic (continuous duration > 3 months)   Laterality: Rt  Current Functional Measure  Score: FOTO 44  Objective measurements identify impairments when they are compared to normal values, the uninvolved extremity, and prior level of function.  [x]  Yes  []  No  Objective assessment of functional ability: Severe functional limitations   Briefly describe symptoms: Pt indicated having pain complaints consistently with sometimes bad.  Pt indicated having constipation at times with medicine.   Reported sleeping "ok" with some waking due to symptoms at times.  Still taking pain medicine at this time.  Reported symptoms as tightness/pain. Difficulty in WB activity including walking.   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   Average pain intensity:  Last 24 hours: 8-9/10 at worst  Past week:  8-9/10 at worst  How often does the pt experience symptoms? Constantly  How much have the symptoms interfered with usual daily activities? Extremely  How has condition changed since care began at this facility? NA - initial visit  In general, how is the patients overall health? Good   BACK PAIN (STarT Back Screening Tool) No

## 2022-11-21 ENCOUNTER — Encounter: Payer: Self-pay | Admitting: Orthopaedic Surgery

## 2022-11-21 ENCOUNTER — Other Ambulatory Visit (INDEPENDENT_AMBULATORY_CARE_PROVIDER_SITE_OTHER): Payer: Medicare Other

## 2022-11-21 ENCOUNTER — Ambulatory Visit: Payer: Medicare Other | Admitting: Orthopaedic Surgery

## 2022-11-21 DIAGNOSIS — Z96651 Presence of right artificial knee joint: Secondary | ICD-10-CM

## 2022-11-21 NOTE — Progress Notes (Signed)
The patient is a 77 year old female who is now 10 weeks status post a right total knee arthroplasty.  We surprisingly did use press-fit implants for her given the fact that she had good bone quality.  She says she still having swelling throughout her leg and some tenderness and is making slow progress with therapy and they will have 1 more visit with her.  On exam her extension is actually full of her right knee and I can flex her to past to 100 degrees so overall she looks good and I gave her reassurance that the knee feels good to me.  Feels ligamentously stable.  Her swelling is getting less and less and overall she looks much better from my standpoint and again I gave her reassurance that I am very pleased with her mobility thus far as well as her motion.  2 views of the right knee show well-seated press-fit total knee arthroplasty with good alignment and no complicating features.  We did show her x-rays from prior to surgery showing her valgus malalignment of that right knee and now how we have improved the alignment with the knee replacement.  From my standpoint we will see her back in 6 months with a standing AP and lateral of her right knee.  If there is she is prior to then she will let us know.

## 2022-11-22 ENCOUNTER — Encounter: Payer: Self-pay | Admitting: Rehabilitative and Restorative Service Providers"

## 2022-11-22 ENCOUNTER — Ambulatory Visit: Payer: Medicare Other | Admitting: Rehabilitative and Restorative Service Providers"

## 2022-11-22 DIAGNOSIS — M25661 Stiffness of right knee, not elsewhere classified: Secondary | ICD-10-CM

## 2022-11-22 DIAGNOSIS — M6281 Muscle weakness (generalized): Secondary | ICD-10-CM

## 2022-11-22 DIAGNOSIS — M25561 Pain in right knee: Secondary | ICD-10-CM

## 2022-11-22 DIAGNOSIS — R262 Difficulty in walking, not elsewhere classified: Secondary | ICD-10-CM | POA: Diagnosis not present

## 2022-11-22 DIAGNOSIS — R600 Localized edema: Secondary | ICD-10-CM

## 2022-11-22 DIAGNOSIS — G8929 Other chronic pain: Secondary | ICD-10-CM

## 2022-11-22 NOTE — Therapy (Addendum)
OUTPATIENT PHYSICAL THERAPY TREATMENT / DISCHARGE   Patient Name: Crystal Jackson MRN: [id number] DOB:04-May-1945, 77 y.o., female Today's Date: 11/22/2022   END OF SESSION:  PT End of Session - 11/22/22 1347     Visit Number 8    Number of Visits 20    Date for PT Re-Evaluation 12/20/22    Authorization Type [employer] $20 copay    Authorization Time Period 10/11/2022- 12/06/2022    Authorization - Visit Number 8    Authorization - Number of Visits 16    Progress Note Due on Visit 10    PT Start Time 1343    PT Stop Time 1422    PT Time Calculation (min) 39 min    Activity Tolerance Patient tolerated treatment well    Behavior During Therapy WFL for tasks assessed/performed                Past Medical History:  Diagnosis Date   Anxiety    Arthritis    Asthma    Back pain    Bilateral swelling of feet    Constipation    Depression    Diabetes mellitus without complication (HCC)    Gallbladder problem    GERD (gastroesophageal reflux disease)    History of low potassium    Hypertension    Joint pain    Obesity    Palpitations    Shortness of breath    Sleep apnea    Past Surgical History:  Procedure Laterality Date   ABDOMINAL HYSTERECTOMY     APPENDECTOMY     CATARACT EXTRACTION, BILATERAL     CHOLECYSTECTOMY     KNEE SURGERY Left    arthroscopy   SHOULDER SURGERY Right    TOTAL KNEE ARTHROPLASTY Right 09/14/2022   Procedure: RIGHT TOTAL KNEE ARTHROPLASTY;  Surgeon: Kathryne Hitch, MD;  Location: [facility];  Service: Orthopedics;  Laterality: Right;   Patient Active Problem List   Diagnosis Date Noted   Status post right knee replacement 09/16/2022   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/14/2022   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   BMI 40.0-44.9, adult (HCC)-current bmi 37.3 03/20/2022   Morbid obesity (HCC) 01/30/2022   BMI 37.0-37.9, adult 01/30/2022   B12 deficiency due to diet 09/27/2021   SOB (shortness of breath) on  exertion 09/27/2021   Chronic vertigo 09/27/2021   Other hyperlipidemia 08/28/2021   Vitamin D deficiency 08/28/2021   Pre-diabetes 06/28/2021   Sepsis (HCC) 05/15/2021   Hypoxia 05/15/2021   Essential hypertension 05/15/2021   DM (diabetes mellitus), type 2 (HCC) 05/15/2021   Hypomagnesemia 05/15/2021   UTI (urinary tract infection) 05/14/2021   Acute exacerbation of chronic low back pain 03/10/2021   OSA on CPAP 09/03/2018   Mild persistent asthma without complication 09/03/2018   Class 3 severe obesity with serious comorbidity and body mass index (BMI) of 40.0 to 44.9 in adult (HCC) 04/14/2018   Unilateral primary osteoarthritis, left knee 04/14/2018   Asthma in adult, mild intermittent, uncomplicated 02/26/2017   Allergic rhinitis 09/05/2013    PCP: Melida Quitter MD  REFERRING PROVIDER: Kathryne Hitch, MD  REFERRING DIAG: [phone] (ICD-10-CM) - Status post right knee replacement  THERAPY DIAG:  Chronic pain of right knee  Muscle weakness (generalized)  Stiffness of right knee, not elsewhere classified  Difficulty in walking, not elsewhere classified  Localized edema  Rationale for Evaluation and Treatment: Rehabilitation  ONSET DATE: Surgery Rt TKA 09/14/2022  SUBJECTIVE:   SUBJECTIVE STATEMENT: Pt indicated feeling  some concern about the stability and making sure it bends.   PERTINENT HISTORY: Anxiety, arthritis, back pain, DM, depression, GERD, HTN.   Seen by clinic in past.   PAIN:  NPRS scale: no specific pain number Pain location: Rt knee  Pain description: tightness Aggravating factors: bending, static positioning, prolonged walking/standing Relieving factors: pain medicine, repositioning  PRECAUTIONS: None  WEIGHT BEARING RESTRICTIONS: No  FALLS:  Has patient fallen in last 6 months? No  LIVING ENVIRONMENT: Lives with: lives with their family Lives in: House/apartment Stairs: outside stairs in front about 6 steps with bilateral  rails.  No stairs inside.  Has following equipment at home: FWW, SPC  OCCUPATION: Retired  PLOF: Independent,  light garden work, some walking for exercise  PATIENT GOALS: Reduce pain, walk normal.    OBJECTIVE:   PATIENT SURVEYS:  11/22/2022: FOTO update:  57  11/09/2022: FOTO update:  52  10/11/2022 FOTO intake:  44  predicted:  59  COGNITION: 10/11/2022 Overall cognitive status: WFL    SENSATION: 10/11/2022 No specific testing today  EDEMA:  10/11/2022 Localized edema Rt knee visually inspected  MUSCLE LENGTH: 10/11/2022 No specific testing today  POSTURE:  10/11/2022 Reliant on UE for support.   PALPATION: 10/11/2022 General tenderness around incision/knee.   LOWER EXTREMITY ROM:   ROM Right 10/11/2022 Right 10/11/2022 Right 11/07/2022 Rt 11/13/22 Right 11/20/2022  Hip flexion       Hip extension       Hip abduction       Hip adduction       Hip internal rotation       Hip external rotation       Knee flexion 84 AROM in supine heel slide  with pain  90 AROM in supine heel slide AROM 105 in supine heel slide  A: 108 supine 110 AROM in supine heel slide  Knee extension -7 AROM in seated LAQ  PROM in heel slide  0 AROM in seated LAQ 0 AROM in seated LAQ A: 0 0 AROM in seated LAQ  Ankle dorsiflexion       Ankle plantarflexion       Ankle inversion       Ankle eversion        (Blank rows = not tested)  LOWER EXTREMITY MMT:  MMT Right 10/11/2022 Left 10/11/2022 Right 11/20/2022 Left 11/20/2022  Hip flexion 5/5 5/5 5/5 5/5  Hip extension      Hip abduction      Hip adduction      Hip internal rotation      Hip external rotation      Knee flexion 3+/5 5/5 5/5   Knee extension 2+/5 5/5 4+/5   Ankle dorsiflexion 5/5 5/5 5/5   Ankle plantarflexion      Ankle inversion      Ankle eversion       (Blank rows = not tested)  LOWER EXTREMITY SPECIAL TESTS:  10/11/2022 No specific testing today  FUNCTIONAL TESTS:  11/20/2022:  Lt SLS:  unable  Rt SLS: 2-3 seconds on several tries  10/11/2022 18 inch chair transfer: unable unassisted without UE  Lt SLS:  not tested Rt SLS: unable   GAIT: 11/20/2022 Able to ambulate in clinic with independence.   10/25/2022:  Ambulated into clinic with use of cane in Lt UE.   10/11/2022 FWW c reduced step length with Lt leg due to reduced stance on Rt/toe off progression.  TODAY'S TREATMENT                                                                          DATE: 11/22/2022 Therex: UBE LE only seat 10 ROM 10 mins lvl 2.0  Incline board gastroc 30 sec x 3 bilateral   Verbal review of existing HEP (ROM, strengthening with quad sets, SLR, chair transfers)  Neuro Re-ed Tandem stance on foam 1 min x 2 bilateral with occasional HHA on bar Tandem ambulation on foam 6 ft in // bars with occasional HHA on bar fwd/back x 6 each   TherActivity Flight of stairs up/down with single rail step to pattern using WB on Rt leg  Leg press 31 lbs 2 x 15 Rt leg only   TODAY'S TREATMENT                                                                          DATE: 11/20/2022 Therex: Recumbent bike, ROM seat 5 lvl 1 8 mins  Incline board gastroc 30 sec x 3 bilateral  Supine Rt knee LAQ in 90 deg hip flexion with pause in end range each direction x 15   Neuro Re-ed Tandem stance on foam 1 min x 1 bilateral with occasional HHA on bar Tandem ambulation on foam 6 ft in // bars with occasional HHA on bar fwd/back x 6 each  SLS on black mat with contralateral touch each corner x 8 each , performed bilaterally with occasional HHA  Alternating heel/toe raises with minimal HHA on bar x 20 each direction for ankle strategy imprrovement  TherActivity Forward step up 6 inch step Rt leg WB x 15 Lateral step up/down 4 inch step WB on Rt  leg x15 with hand rail assist in front  Leg press 31 lbs 2 x 15 Rt leg only    TODAY'S TREATMENT                                                                          DATE: 11/16/2022 Therex: UBE LE only 8 mins lvl 2.5 for ROM, seat 9 Supine Rt leg heel slide AROM 5 sec hold x 10  Neuro Re-ed Tandem stance on foam 1 min x 2 bilateral with occasional HHA on bar Tandem ambulation on ground 10 ft in // bars with occasional HHA on bar fwd/back x 5 each   TherActivity Step up on over and down 4 inch step x 10 WB on Rt leg with single hand rail assist  Lateral step up/down WB on Rt leg x15 with hand rail assist in front  Leg press Rt leg only 31 lbs 2 x 15  Vasopneumatic  Rt knee medium  compression 34 deg 10 mins in elevation  TODAY'S TREATMENT                                                                          DATE:11/13/2022 Therex: Nustep: level 6 x 10 minutes seat 8 Calf stretch on slant board: x 2 holding 30 sec Heel/Toe lifts x 20 c UE support Rt LE on 6 inch step in lunge position rocking forward and back 2 x 30 sec Seated SLR 2 x 10 Rt LE Leg Press: bil 75# 2 x 10, Rt LE only 25# x 10, 31# x 10  NMR:  Stepping over 6 inch hurdle with single UE support x 15  Cone tapping (2 cones placed in front of each LE), alternating c single UE support as needed on TM bar x 20  Modalites:  Vasopneumatic: medium compression, 34 degrees x 10 minutes   PATIENT EDUCATION:  10/25/2022 Education details: HEP update Person educated: Patient Education method: Programmer, multimedia, Demonstration, Verbal cues, and Handouts Education comprehension: verbalized understanding, returned demonstration, and verbal cues required  HOME EXERCISE PROGRAM: Access Code: [email or web address] URL: [email or web address] Date: 10/25/2022 Prepared by: Chyrel Masson  Exercises - Supine Heel Slide  - 3-5 x daily - 7 x weekly - 1 sets - 10 reps - 5 hold - Supine Knee Extension Mobilization with Weight  -  4-5 x daily - 7 x weekly - 1 sets - 1 reps - to tolerance up to 15 mins hold - Seated Long Arc Quad  - 3-5 x daily - 7 x weekly - 1 sets - 5-10 reps - 2 hold - Seated Quad Set  - 3-5 x daily - 7 x weekly - 1 sets - 10 reps - 5 hold - Seated Knee Flexion Extension AAROM with Overpressure (Mirrored)  - 2-3 x daily - 7 x weekly - 1 sets - 5 reps - 10 hold - Seated Straight Leg Heel Taps  - 1-2 x daily - 7 x weekly - 2-3 sets - 10 reps  ASSESSMENT:  CLINICAL IMPRESSION: Pt indicated good report from MD visit.  Pt was good in knowledge of HEP at this time.  Recommended trial HEP as she indicated comfort in plan.  Plan to hold therapy for up to 30 days then discharge if not returning.   OBJECTIVE IMPAIRMENTS: Abnormal gait, decreased activity tolerance, decreased balance, decreased coordination, decreased endurance, decreased mobility, difficulty walking, decreased ROM, decreased strength, hypomobility, increased edema, increased fascial restrictions, impaired perceived functional ability, impaired flexibility, improper body mechanics, and pain.   ACTIVITY LIMITATIONS: carrying, lifting, bending, sitting, standing, squatting, sleeping, stairs, transfers, and locomotion level  PARTICIPATION LIMITATIONS: meal prep, cleaning, laundry, interpersonal relationship, driving, shopping, and community activity  PERSONAL FACTORS:  Anxiety, arthritis, back pain, DM, depression, GERD, HTN.   are also affecting patient's functional outcome.   REHAB POTENTIAL: Good  CLINICAL DECISION MAKING: Stable/uncomplicated  EVALUATION COMPLEXITY: Low   GOALS: Goals reviewed with patient? Yes  SHORT TERM GOALS: (target date for Short term goals are 3 weeks  11/01/2022)   1.  Patient will demonstrate independent use of home exercise program to maintain progress from in clinic treatments.  Goal status: Met  LONG TERM GOALS: (target dates for all long  term goals are 10 weeks  12/20/2022 )   1. Patient will  demonstrate/report pain at worst less than or equal to 2/10 to facilitate minimal limitation in daily activity secondary to pain symptoms.  Goal status: on going 11/20/2022   2. Patient will demonstrate independent use of home exercise program to facilitate ability to maintain/progress functional gains from skilled physical therapy services.  Goal status:  on going 11/20/2022   3. Patient will demonstrate FOTO outcome > or = 59 % to indicate reduced disability due to condition.  Goal status:  mostly met 11/22/2022   4.  Patient will demonstrate Rt LE MMT 5/5 throughout to faciltiate usual transfers, stairs, squatting at [facility] for daily life.   Goal status:  on going 11/20/2022   5.  Patient will demonstrate Rt knee AROM 0-110 deg to facilitate transfers, ambulation, stairs and other movements of daily life at PLOF.  Goal status:  on going 11//11/20/2022   6.  Patient will demonstrate independent ambulation community distances > 300 ft for community navigation.  Goal status:  on going (in clinic met) 11/20/2022   7.  Patient will demonstrate ascending/descending stairs with single rail assist for household entry.  Goal Status:  on going 11/20/2022   PLAN:  PT FREQUENCY: 1-2x/week  PT DURATION: 10 weeks  PLANNED INTERVENTIONS: Therapeutic exercises, Therapeutic activity, Neuro Muscular re-education, Balance training, Gait training, Patient/Family education, Joint mobilization, Stair training, DME instructions, Dry Needling, Electrical stimulation, Traction, Cryotherapy, vasopneumatic deviceMoist heat, Taping, Ultrasound, Ionotophoresis 4mg /ml Dexamethasone, and aquatic therapy, Manual therapy.  All included unless contraindicated  PLAN FOR NEXT SESSION:  Trial HEP period . Discharge after 30 days inactivity.    Chyrel Masson, PT, DPT, OCS, ATC 11/22/22  2:21 PM   PHYSICAL THERAPY DISCHARGE SUMMARY  Visits from Start of Care: 8  Current functional level related to goals /  functional outcomes: See note   Remaining deficits: See note   Education / Equipment: HEP  Patient goals were  mostly met . Patient is being discharged due to not returning since the last visit.  Chyrel Masson, PT, DPT, OCS, ATC 12/28/22  9:58 AM       Date of referral: 09/27/2022 Referring provider: Kathryne Hitch, MD Referring diagnosis? B14.782 (ICD-10-CM) - Status post right knee replacement Treatment diagnosis? (if different than referring diagnosis)  M25.561  What was this (referring dx) caused by? Surgery (Type: RT TKA 09/14/2022)  Ashby Dawes of Condition: Chronic (continuous duration > 3 months)   Laterality: Rt  Current Functional Measure Score: FOTO 44  Objective measurements identify impairments when they are compared to normal values, the uninvolved extremity, and prior level of function.  [x]  Yes  []  No  Objective assessment of functional ability: Severe functional limitations   Briefly describe symptoms: Pt indicated having pain complaints consistently with sometimes bad.  Pt indicated having constipation at times with medicine.   Reported sleeping "ok" with some waking due to symptoms at times.  Still taking pain medicine at this time.  Reported symptoms as tightness/pain. Difficulty in WB activity including walking.   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   Average pain intensity:  Last 24 hours: 8-9/10 at worst  Past week:  8-9/10 at worst  How often does the pt experience symptoms? Constantly  How much have the symptoms interfered with usual daily activities? Extremely  How has condition changed since care began at this facility? NA - initial visit  In general, how is the  patients overall health? Good   BACK PAIN (STarT Back Screening Tool) No

## 2022-11-23 ENCOUNTER — Telehealth: Payer: Medicare Other | Admitting: Pulmonary Disease

## 2022-11-23 DIAGNOSIS — G4733 Obstructive sleep apnea (adult) (pediatric): Secondary | ICD-10-CM

## 2022-11-23 DIAGNOSIS — K219 Gastro-esophageal reflux disease without esophagitis: Secondary | ICD-10-CM

## 2022-11-23 DIAGNOSIS — J4530 Mild persistent asthma, uncomplicated: Secondary | ICD-10-CM

## 2022-11-23 NOTE — Progress Notes (Signed)
Crystal Jackson    [id number]    1945/01/03  Primary Care Physician:Wile, Nyoka Cowden, MD  Referring Physician: Melida Quitter, MD [address] [place],  [place] [address]  Virtual Visit via Video Note  I connected with Crystal Jackson on 11/23/22 at  1:45 PM EST by a video enabled telemedicine application and verified that I am speaking with the correct person using two identifiers.  Location: Patient: Home Provider: Clinic   I discussed the limitations of evaluation and management by telemedicine and the availability of in person appointments. The patient expressed understanding and agreed to proceed.  Chief complaint:   Follow up for Upper airway chronic cough,  Asthmatic bronchitis. GERD OSA on auto CPAP   HPI: Crystal Jackson is a 77 y.o.  with past medical history of high blood pressure. She had been diagnosed with bronchitis as a child. She reports increasing frequency of attacks of the past few years. Her latest episode was in Nov 2017  Her symptoms start with sinusitis postnasal drip which then proceeds to cough with yellow mucus, wheezing, chest congestion. She does not report any fevers or chills. She has seasonal allergies, rhinitis, postnasal drip. She has significant issues with acid reflux and heartburn and is on Protonix 40 mg twice a day. She was assessed by GI and was told she has a small hiatal hernia.  Symptoms have stabilized on Arnuity inhaler.  In 2019 she stopped it temporarily and noted increasing cough, dyspnea.  Interim History: Discussed the use of AI scribe software for clinical note transcription with the patient, who gave verbal consent to proceed.  The patient, with a history of recent surgery, reports feeling better and is recovering well. They deny any lung issues post-operatively. They are currently on Arnuity and are using a CPAP machine daily without any issues. They also mention a need for paperwork, possibly related to their recent  hospitalization.   Outpatient Encounter Medications as of 11/23/2022  Medication Sig   albuterol (VENTOLIN HFA) 108 (90 Base) MCG/ACT inhaler Inhale 1-2 puffs into the lungs every 6 (six) hours as needed for wheezing or shortness of breath.   aspirin 81 MG chewable tablet Chew 1 tablet (81 mg total) by mouth 2 (two) times daily.   b complex vitamins capsule Take 1 capsule by mouth daily.   baclofen (LIORESAL) 10 MG tablet Take 1 tablet (10 mg total) by mouth 3 (three) times daily as needed for muscle spasms.   Calcium Carbonate-Vitamin D 600-400 MG-UNIT tablet 1 po qd   cetirizine (ZYRTEC) 10 MG tablet Take 10 mg by mouth daily.   clotrimazole-betamethasone (LOTRISONE) cream 1 application. daily as needed (eczema).   diclofenac Sodium (VOLTAREN) 1 % GEL Apply 2 g topically daily as needed (pain).   DULoxetine (CYMBALTA) 30 MG capsule Take 30 mg by mouth in the morning, at noon, and at bedtime.   Fluticasone Furoate (ARNUITY ELLIPTA) 200 MCG/ACT AEPB Inhale 1 puff into the lungs daily.   gabapentin (NEURONTIN) 300 MG capsule Take 300 mg by mouth 2 (two) times daily.   metFORMIN (GLUCOPHAGE) 500 MG tablet 1 po with lunch and dinner   metoprolol tartrate (LOPRESSOR) 25 MG tablet Take 50 mg by mouth 2 (two) times daily.   Multiple Vitamin (MULTIVITAMIN) tablet Take 1 tablet by mouth daily.    NON FORMULARY Pt uses a cpap nightly   Omega-3 Fatty Acids (FISH OIL) 500 MG CAPS Take 500 mg by mouth daily.  oxyCODONE (OXY IR/ROXICODONE) 5 MG immediate release tablet Take 1-2 tablets (5-10 mg total) by mouth every 6 (six) hours as needed for moderate pain (pain score 4-6) (pain score 4-6).   pantoprazole (PROTONIX) 40 MG tablet Take 40 mg by mouth 2 (two) times daily.   Polyethylene Glycol 400 (BLINK TEARS OP) Place 1 drop into both eyes as needed (dry eyes).   spironolactone (ALDACTONE) 25 MG tablet Take 12.5 mg by mouth daily.   traZODone (DESYREL) 100 MG tablet Take 100 mg by mouth at bedtime.    triamcinolone cream (KENALOG) 0.1 % Apply 1 application. topically daily as needed (itching).   Xylitol 500 MG DISK Use as directed 1 tablet in the mouth or throat as needed (as needed for dry mouth).   No facility-administered encounter medications on file as of 11/23/2022.   Physical Exam: tele  Data Reviewed: Images: CT abdomen pelvis 05/12/2021-visualized lung bases neurologic abnormality Chest x-ray 05/14/2021-visualized lung bases show mild left basal atelectasis I reviewed the images personally.  PFTs 7/44/17 FVC 2.54 [83%], FEV1 1.9 to [83%), F/F 76, TLC 89%, DLCO 72%. Minimal obstructive lung disease with mild reduction in diffusion capacity that corrects for alveolar volume.  FENO 04/15/17-18  Sleep study 07/20/15 Mild OSA, AHI 11.4. Low O2 sats of 71%.  CPAP download 09/23/2019 97% usage with residual AHI of 0.3, median pressure of 5.4  Assessment/Plan: Upper airway cough syndrome, Asthmatic bronchitis. Her PFTs show minimal obstruction. However there is marked reduction in mid flow rates indicating small airway disease with significant improvement post albuterol inhaler.   Continue current medications. Stable on arnuity We had attempted trial of steroid inhaler in the past but she was not hence back on it Flonase, chlorpheniramine as needed  OSA Mild OSA on recent home sleep study.  Continues on AutoSet CPAP 5- 15  GERD Continues on protonix  Plan: - Continue chlorpheniramine, flonase as needed - Continue arnuity inhaler, albuterol PRN - Autoset CPAP. Work on weight loss.  I discussed the assessment and treatment plan with the patient. The patient was provided an opportunity to ask questions and all were answered. The patient agreed with the plan and demonstrated an understanding of the instructions.   The patient was advised to call back or seek an in-person evaluation if the symptoms worsen or if the condition fails to improve as anticipated.  Chilton Greathouse  MD [facility] 11/23/2022, 1:50 PM  CC: Melida Quitter, MD

## 2022-11-23 NOTE — Patient Instructions (Signed)
VISIT SUMMARY:  You are recovering well from your recent surgery and have not experienced any lung issues post-operatively. You are currently using Arnuity and a CPAP machine daily without any problems.  YOUR PLAN:  -RESPIRATORY CONDITION: Your respiratory condition is stable. This means that your lungs are functioning well without any recent issues. Continue using Arnuity and your CPAP machine as prescribed.  -GENERAL HEALTH MAINTENANCE: Your general health is being monitored, and you should return for a follow-up appointment in six months to ensure everything remains on track.  INSTRUCTIONS:  Please return for a follow-up appointment in six months.

## 2022-11-28 ENCOUNTER — Encounter: Payer: Self-pay | Admitting: Pulmonary Disease

## 2022-12-05 ENCOUNTER — Ambulatory Visit: Payer: Medicare Other | Admitting: Sports Medicine

## 2022-12-05 NOTE — Telephone Encounter (Signed)
I have faxed the referral, notes, sleep study to [facility]

## 2022-12-09 ENCOUNTER — Encounter: Payer: Self-pay | Admitting: Sports Medicine

## 2022-12-10 ENCOUNTER — Encounter: Payer: Self-pay | Admitting: Sports Medicine

## 2022-12-10 ENCOUNTER — Ambulatory Visit: Payer: Medicare Other | Admitting: Sports Medicine

## 2022-12-10 ENCOUNTER — Other Ambulatory Visit: Payer: Self-pay

## 2022-12-10 DIAGNOSIS — M19012 Primary osteoarthritis, left shoulder: Secondary | ICD-10-CM | POA: Diagnosis not present

## 2022-12-10 DIAGNOSIS — M25512 Pain in left shoulder: Secondary | ICD-10-CM

## 2022-12-10 DIAGNOSIS — G8929 Other chronic pain: Secondary | ICD-10-CM

## 2022-12-10 MED ORDER — LIDOCAINE HCL 1 % IJ SOLN
2.0000 mL | INTRAMUSCULAR | Status: AC | PRN
Start: 2022-12-10 — End: 2022-12-10
  Administered 2022-12-10: 2 mL

## 2022-12-10 MED ORDER — BUPIVACAINE HCL 0.25 % IJ SOLN
2.0000 mL | INTRAMUSCULAR | Status: AC | PRN
Start: 2022-12-10 — End: 2022-12-10
  Administered 2022-12-10: 2 mL via INTRA_ARTICULAR

## 2022-12-10 MED ORDER — METHYLPREDNISOLONE ACETATE 40 MG/ML IJ SUSP
80.0000 mg | INTRAMUSCULAR | Status: AC | PRN
Start: 2022-12-10 — End: 2022-12-10
  Administered 2022-12-10: 80 mg via INTRA_ARTICULAR

## 2022-12-10 NOTE — Progress Notes (Signed)
Crystal Jackson - 77 y.o. female MRN [id number]  Date of birth: Aug 25, 1945  Office Visit Note: Visit Date: 12/10/2022 PCP: Melida Quitter, MD Referred by: Melida Quitter, MD  Subjective: Chief Complaint  Patient presents with   Left Shoulder - Pain   HPI: Crystal Jackson is a pleasant 77 y.o. female who presents today for chronic left shoulder pain.  We have performed 1 provide ultrasound-guided glenohumeral joint injection back on 04/12/2022 which provided her rather excellent relief of her pain for almost 6 months.  Here over the last 4 to 6 weeks her shoulder pain has become exacerbated again.  She reports some stiffness and pain with certain reaching motions.  Denies any numbness or tingling.  She will use topical Voltaren gel and ibuprofen as needed for pain control.     Pertinent ROS were reviewed with the patient and found to be negative unless otherwise specified above in HPI.   Assessment & Plan: Visit Diagnoses:  1. Primary osteoarthritis of left shoulder   2. Chronic left shoulder pain    Plan: Impression is acute on chronic exacerbation of left shoulder osteoarthritis.  She had excellent relief for about 6 months from prior ultrasound-guided glenohumeral joint injection, through shared decision making we did repeat this today which she tolerated well.  Discussed after 48 to 72 hours of modified rest/activity, she may continue activity as tolerated.  I do not think she needs formalized physical therapy, but discussed wall walking and other range of motion exercises to preserve function.  She may use ibuprofen, Tylenol or ice/heat for any postinjection pain.  She will follow-up with me as needed.  We did discuss the role for infrequent injections no sooner than 3 months apart.  Follow-up: Return if symptoms worsen or fail to improve.   Meds & Orders: No orders of the defined types were placed in this encounter.   Orders Placed This Encounter  Procedures   Large Joint Inj   US  Guided Needle Placement - No Linked Charges     Procedures: Large Joint Inj: L glenohumeral on 12/10/2022 3:18 PM Indications: pain Details: 22 G 3.5 in needle, ultrasound-guided posterior approach Medications: 2 mL lidocaine 1 %; 2 mL bupivacaine 0.25 %; 80 mg methylPREDNISolone acetate 40 MG/ML Outcome: tolerated well, no immediate complications  US-guided glenohumeral joint injection, left shoulder After discussion on risks/benefits/indications, informed verbal consent was obtained. A timeout was then performed. The patient was positioned lying lateral recumbent on examination table. The patient's shoulder was prepped with betadine and multiple alcohol swabs and utilizing ultrasound guidance, the patient's glenohumeral joint was identified on ultrasound. Using ultrasound guidance a 22-gauge, 3.5 inch needle with a mixture of 2:2:2 cc's lidocaine:bupivicaine:depomedrol was directed from a lateral to medial direction via in-plane technique into the glenohumeral joint with visualization of appropriate spread of injectate into the joint. Patient tolerated the procedure well without immediate complications.      Procedure, treatment alternatives, risks and benefits explained, specific risks discussed. Consent was given by the patient. Immediately prior to procedure a time out was called to verify the correct patient, procedure, equipment, support staff and site/side marked as required. Patient was prepped and draped in the usual sterile fashion.          Clinical History:   She reports that she has never smoked. She has never been exposed to tobacco smoke. She has never used smokeless tobacco.  Recent Labs    01/29/22 1304 09/05/22 1444  HGBA1C 5.7* 5.5  Objective:    Physical Exam  Gen: Well-appearing, in no acute distress; non-toxic CV: Well-perfused. Warm.  Resp: Breathing unlabored on room air; no wheezing. Psych: Fluid speech in conversation; appropriate affect; normal  thought process Neuro: Sensation intact throughout. No gross coordination deficits.   Ortho Exam - Left shoulder: Forward flexion to approximately 140 degrees and abduction 135 degrees before some scapular compensation, unable to take her slightly further passively but there is some grating at end range of motion.  No redness swelling or effusion.  External rotation is limited to only about 25-30 degrees compared to 45-50 degrees of the contralateral side.  Imaging:  Narrative & Impression  CLINICAL DATA:  Left shoulder pain and weakness.   EXAM: MRI OF THE LEFT SHOULDER WITHOUT CONTRAST   TECHNIQUE: Multiplanar, multisequence MR imaging of the shoulder was performed. No intravenous contrast was administered.   COMPARISON:  None Available.   FINDINGS: Rotator cuff: Moderate tendinosis of the supraspinatus tendon with a tiny partial-thickness bursal surface tear along the posterior aspect (image 17/series 4) and fraying along the articular surface.   Moderate tendinosis of the infraspinatus tendon. Teres minor tendon is intact. Mild tendinosis of the subscapularis tendon.   Muscles: No muscle atrophy or edema. No intramuscular fluid collection or hematoma.   Biceps Long Head: Mild tendinosis of the intra-articular portion of the long head of the biceps tendon.   Acromioclavicular Joint: No significant arthropathy of the acromioclavicular joint. Trace subacromial/subdeltoid bursal fluid.   Glenohumeral Joint: Small joint effusion. High-grade partial-thickness cartilage loss of the glenohumeral joint.   Labrum: Grossly intact, but evaluation is limited by lack of intraarticular fluid/contrast.   Bones: No fracture or dislocation. No aggressive osseous lesion.   Other: No fluid collection or hematoma.   IMPRESSION: 1. Moderate tendinosis of the supraspinatus tendon with a tiny partial-thickness bursal surface tear along the posterior aspect (image 17/series 4) and  fraying along the articular surface. 2. Moderate tendinosis of the infraspinatus tendon. 3. Mild tendinosis of the subscapularis tendon. 4. Mild tendinosis of the intra-articular portion of the long head of the biceps tendon. 5. Moderate osteoarthritis of the glenohumeral joint.     Electronically Signed   By: Elige Ko M.D.   On: 03/30/2022 11:39   *02/05/22: Left shoulder 3 views shoulder is well located.  No acute fractures.   Glenohumeral joint is well-maintained.  No significant arthritic changes.   Past Medical/Family/Surgical/Social History: Medications & Allergies reviewed per EMR, new medications updated. Patient Active Problem List   Diagnosis Date Noted   Status post right knee replacement 09/16/2022   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/14/2022   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   BMI 40.0-44.9, adult (HCC)-current bmi 37.3 03/20/2022   Morbid obesity (HCC) 01/30/2022   BMI 37.0-37.9, adult 01/30/2022   B12 deficiency due to diet 09/27/2021   SOB (shortness of breath) on exertion 09/27/2021   Chronic vertigo 09/27/2021   Other hyperlipidemia 08/28/2021   Vitamin D deficiency 08/28/2021   Pre-diabetes 06/28/2021   Sepsis (HCC) 05/15/2021   Hypoxia 05/15/2021   Essential hypertension 05/15/2021   DM (diabetes mellitus), type 2 (HCC) 05/15/2021   Hypomagnesemia 05/15/2021   UTI (urinary tract infection) 05/14/2021   Acute exacerbation of chronic low back pain 03/10/2021   OSA on CPAP 09/03/2018   Mild persistent asthma without complication 09/03/2018   Class 3 severe obesity with serious comorbidity and body mass index (BMI) of 40.0 to 44.9 in adult (HCC) 04/14/2018   Unilateral primary osteoarthritis,  left knee 04/14/2018   Asthma in adult, mild intermittent, uncomplicated 02/26/2017   Allergic rhinitis 09/05/2013   Past Medical History:  Diagnosis Date   Anxiety    Arthritis    Asthma    Back pain    Bilateral swelling of feet     Constipation    Depression    Diabetes mellitus without complication (HCC)    Gallbladder problem    GERD (gastroesophageal reflux disease)    History of low potassium    Hypertension    Joint pain    Obesity    Palpitations    Shortness of breath    Sleep apnea    Family History  Problem Relation Age of Onset   Diabetes Mother    Heart disease Mother    Anxiety disorder Mother    Obesity Mother    Diabetes Father    Hypertension Father    Obesity Father    Past Surgical History:  Procedure Laterality Date   ABDOMINAL HYSTERECTOMY     APPENDECTOMY     CATARACT EXTRACTION, BILATERAL     CHOLECYSTECTOMY     KNEE SURGERY Left    arthroscopy   SHOULDER SURGERY Right    TOTAL KNEE ARTHROPLASTY Right 09/14/2022   Procedure: RIGHT TOTAL KNEE ARTHROPLASTY;  Surgeon: Kathryne Hitch, MD;  Location: [facility];  Service: Orthopedics;  Laterality: Right;   Social History   Occupational History   Not on file  Tobacco Use   Smoking status: Never    Passive exposure: Never   Smokeless tobacco: Never  Vaping Use   Vaping status: Never Used  Substance and Sexual Activity   Alcohol use: No    Alcohol/week: 0.0 standard drinks of alcohol   Drug use: No   Sexual activity: Yes

## 2022-12-14 ENCOUNTER — Encounter: Payer: Self-pay | Admitting: Orthopaedic Surgery

## 2022-12-16 ENCOUNTER — Encounter: Payer: Self-pay | Admitting: Orthopaedic Surgery

## 2022-12-17 ENCOUNTER — Telehealth: Payer: Self-pay | Admitting: Orthopaedic Surgery

## 2022-12-17 NOTE — Telephone Encounter (Signed)
Patient called and said her right knee is still hurting after the popping. What should she do? [phone]

## 2023-01-06 ENCOUNTER — Encounter: Payer: Self-pay | Admitting: Orthopaedic Surgery

## 2023-01-15 ENCOUNTER — Encounter: Payer: Self-pay | Admitting: Pulmonary Disease

## 2023-01-16 ENCOUNTER — Telehealth: Payer: Self-pay | Admitting: Pulmonary Disease

## 2023-01-16 NOTE — Telephone Encounter (Signed)
 Patient states needs order for CPAP machine and supplies. Patient using [facility] now. Patient phone number is [phone].

## 2023-01-23 ENCOUNTER — Ambulatory Visit: Payer: Medicare Other | Admitting: Pulmonary Disease

## 2023-01-28 NOTE — Telephone Encounter (Signed)
I called the pt and there was no answer- lmtcb We ordered CPAP through [facility] already on 09/06/22

## 2023-01-29 NOTE — Telephone Encounter (Signed)
Per telephone encounter from 1/15, Crystal Jackson call the patient and left a message.  I called the pt and there was no answer- lmtcb We ordered CPAP through [facility] already on 09/06/22      Closing this encounter.

## 2023-02-01 DIAGNOSIS — G4733 Obstructive sleep apnea (adult) (pediatric): Secondary | ICD-10-CM | POA: Diagnosis not present

## 2023-02-02 ENCOUNTER — Other Ambulatory Visit: Payer: Self-pay | Admitting: Pulmonary Disease

## 2023-02-05 ENCOUNTER — Encounter: Payer: Self-pay | Admitting: Pulmonary Disease

## 2023-02-05 DIAGNOSIS — G4733 Obstructive sleep apnea (adult) (pediatric): Secondary | ICD-10-CM

## 2023-02-06 ENCOUNTER — Encounter: Payer: Self-pay | Admitting: Orthopaedic Surgery

## 2023-02-06 ENCOUNTER — Ambulatory Visit: Payer: HMO | Admitting: Orthopaedic Surgery

## 2023-02-06 ENCOUNTER — Other Ambulatory Visit (INDEPENDENT_AMBULATORY_CARE_PROVIDER_SITE_OTHER): Payer: PPO

## 2023-02-06 DIAGNOSIS — Z96651 Presence of right artificial knee joint: Secondary | ICD-10-CM

## 2023-02-06 NOTE — Progress Notes (Signed)
 The patient is a 78 year old female who is over 4 months out from a right total knee replacement.  She reports that she did feel some type of pop in her knee in mid December and has had some discomfort in her knee since then.  Examination of her knee shows no effusion today.  There is some pain over the patella but her extensor mechanism is intact and she is able to flex her knee as well.  X-rays of the knee today are reviewed and there is a cortical irregularity at the patella suggesting there is a fracture of the patella.  Overall though the patella button is intact and the patella is not displaced and overall looks acceptable in terms of the alignment.  The femoral and tibial components are press-fit but the patella component was cemented.  I want her to stop all exercises and just walk as exercise.  I would like to see her back in 2 months with a repeat AP and lateral of her right knee.  I showed her x-rays and explained that I think is going on.  Hopefully we can get this to heal with no issues.

## 2023-02-07 ENCOUNTER — Telehealth: Payer: Self-pay | Admitting: Pulmonary Disease

## 2023-02-07 ENCOUNTER — Other Ambulatory Visit: Payer: Self-pay | Admitting: Orthopaedic Surgery

## 2023-02-07 MED ORDER — BACLOFEN 10 MG PO TABS: 10.0000 mg | ORAL_TABLET | Freq: Three times a day (TID) | ORAL | 0 refills | Status: DC | PRN

## 2023-02-07 NOTE — Telephone Encounter (Signed)
 Patient is in need of a new cpap machine. The motor life as exceeded on hers. Patient can be reached at [phone]  Patient also needs a refill on anuity inhaler.  Pharmacy: [facility] on [address]

## 2023-02-07 NOTE — Telephone Encounter (Signed)
 I have left a message on their machine to have them return call. No answer at this time.  Patient had order placed in 08/2022 and [facility] said they had it. Unsure if anything came form it.

## 2023-02-08 MED ORDER — ARNUITY ELLIPTA 200 MCG/ACT IN AEPB: 1.0000 | INHALATION_SPRAY | Freq: Every day | RESPIRATORY_TRACT | 11 refills | Status: AC

## 2023-02-08 NOTE — Telephone Encounter (Signed)
 Dr. Waylan Haggard, please advise if okay to order replacement cpap.

## 2023-02-12 NOTE — Telephone Encounter (Signed)
Okay to send in renewal order for replacement CPAP.

## 2023-02-13 NOTE — Addendum Note (Signed)
Addended by: Jacquiline Doe on: 02/13/2023 12:07 PM   Modules accepted: Orders

## 2023-02-13 NOTE — Telephone Encounter (Signed)
Please see 02/05/23 message. DME order has been placed.

## 2023-02-18 DIAGNOSIS — H0100B Unspecified blepharitis left eye, upper and lower eyelids: Secondary | ICD-10-CM | POA: Diagnosis not present

## 2023-02-18 DIAGNOSIS — H43813 Vitreous degeneration, bilateral: Secondary | ICD-10-CM | POA: Diagnosis not present

## 2023-02-18 DIAGNOSIS — H524 Presbyopia: Secondary | ICD-10-CM | POA: Diagnosis not present

## 2023-02-18 DIAGNOSIS — H0100A Unspecified blepharitis right eye, upper and lower eyelids: Secondary | ICD-10-CM | POA: Diagnosis not present

## 2023-02-18 DIAGNOSIS — H02831 Dermatochalasis of right upper eyelid: Secondary | ICD-10-CM | POA: Diagnosis not present

## 2023-02-18 DIAGNOSIS — H02834 Dermatochalasis of left upper eyelid: Secondary | ICD-10-CM | POA: Diagnosis not present

## 2023-02-18 DIAGNOSIS — R7303 Prediabetes: Secondary | ICD-10-CM | POA: Diagnosis not present

## 2023-02-18 DIAGNOSIS — H04123 Dry eye syndrome of bilateral lacrimal glands: Secondary | ICD-10-CM | POA: Diagnosis not present

## 2023-02-18 DIAGNOSIS — H52203 Unspecified astigmatism, bilateral: Secondary | ICD-10-CM | POA: Diagnosis not present

## 2023-02-18 DIAGNOSIS — H5213 Myopia, bilateral: Secondary | ICD-10-CM | POA: Diagnosis not present

## 2023-02-28 ENCOUNTER — Telehealth: Payer: Self-pay | Admitting: Pulmonary Disease

## 2023-03-05 ENCOUNTER — Encounter: Payer: Self-pay | Admitting: Pulmonary Disease

## 2023-03-06 ENCOUNTER — Other Ambulatory Visit: Payer: Self-pay

## 2023-03-06 DIAGNOSIS — G4733 Obstructive sleep apnea (adult) (pediatric): Secondary | ICD-10-CM

## 2023-03-18 NOTE — Telephone Encounter (Signed)
 DME order placed with [facility] 03/06/23.  Nothing further at this time.

## 2023-03-21 DIAGNOSIS — B0089 Other herpesviral infection: Secondary | ICD-10-CM | POA: Diagnosis not present

## 2023-03-21 DIAGNOSIS — D485 Neoplasm of uncertain behavior of skin: Secondary | ICD-10-CM | POA: Diagnosis not present

## 2023-03-21 DIAGNOSIS — D1801 Hemangioma of skin and subcutaneous tissue: Secondary | ICD-10-CM | POA: Diagnosis not present

## 2023-03-21 DIAGNOSIS — L821 Other seborrheic keratosis: Secondary | ICD-10-CM | POA: Diagnosis not present

## 2023-03-21 DIAGNOSIS — L570 Actinic keratosis: Secondary | ICD-10-CM | POA: Diagnosis not present

## 2023-03-21 DIAGNOSIS — L438 Other lichen planus: Secondary | ICD-10-CM | POA: Diagnosis not present

## 2023-03-21 DIAGNOSIS — L853 Xerosis cutis: Secondary | ICD-10-CM | POA: Diagnosis not present

## 2023-04-04 ENCOUNTER — Ambulatory Visit: Admitting: Orthopedic Surgery

## 2023-04-04 ENCOUNTER — Other Ambulatory Visit

## 2023-04-04 DIAGNOSIS — M19012 Primary osteoarthritis, left shoulder: Secondary | ICD-10-CM

## 2023-04-04 DIAGNOSIS — M25512 Pain in left shoulder: Secondary | ICD-10-CM | POA: Diagnosis not present

## 2023-04-04 DIAGNOSIS — G8929 Other chronic pain: Secondary | ICD-10-CM

## 2023-04-05 ENCOUNTER — Encounter: Payer: Self-pay | Admitting: Orthopedic Surgery

## 2023-04-05 ENCOUNTER — Telehealth: Payer: Self-pay

## 2023-04-05 MED ORDER — LIDOCAINE HCL 1 % IJ SOLN
5.0000 mL | INTRAMUSCULAR | Status: AC | PRN
Start: 2023-04-04 — End: 2023-04-04
  Administered 2023-04-04: 5 mL

## 2023-04-05 MED ORDER — METHYLPREDNISOLONE ACETATE 40 MG/ML IJ SUSP
40.0000 mg | INTRAMUSCULAR | Status: AC | PRN
Start: 2023-04-04 — End: 2023-04-04
  Administered 2023-04-04: 40 mg via INTRA_ARTICULAR

## 2023-04-05 MED ORDER — BUPIVACAINE HCL 0.5 % IJ SOLN
9.0000 mL | INTRAMUSCULAR | Status: AC | PRN
Start: 2023-04-04 — End: 2023-04-04
  Administered 2023-04-04: 9 mL via INTRA_ARTICULAR

## 2023-04-05 NOTE — Progress Notes (Signed)
 Office Visit Note   [name]           Date of Birth: 1945/11/19           MRN: [id number] Visit Date: 04/04/2023 Requested by: Melida Quitter, MD [address] [place],  [place] [address] PCP: Melida Quitter, MD  Subjective: Chief Complaint  Patient presents with   Left Shoulder - Pain    HPI: Crystal Jackson is a 78 y.o. female who presents to the office reporting continued left shoulder pain.  Patient had glenohumeral joint injection by Dr. Shon Baton on 12/10/2022.  Did get good relief for 2 months.  Now is reporting recurrent pain for 2 months.  She does feel the pain is now worse in the shoulder than it has been.  She is right-hand dominant.  She would like to get an injection today.  Takes ibuprofen and Tylenol for her symptoms.  Describes with weakness and pain in that left shoulder.  Cannot sleep on the left-hand side.  Currently she is dealing with a patella fracture in her right total knee replacement.  She would like to get that resolved before pursuing her painful left shoulder.  Husband with her today..                ROS: All systems reviewed are negative as they relate to the chief complaint within the history of present illness.  Patient denies fevers or chills.  Assessment & Plan: Visit Diagnoses:  1. Chronic left shoulder pain     Plan: Impression is end-stage arthritis in that left shoulder with diminished range of motion and diminished functionality below shoulder level.  I think Crystal Jackson is a good candidate for reverse shoulder replacement.  The risk and benefits are discussed.  Today we are going to do a glenohumeral joint injection into that shoulder for pain relief.  We could perform about 3 maximum injections per calendar year.  Patient states she cannot really roll her hair.  She will come back in 3 to 4 months for further clinical recheck after she gets this knee issue resolved.  Follow-Up Instructions: No follow-ups on file.   Orders:  Orders Placed This  Encounter  Procedures   US Guided Needle Placement - No Linked Charges   No orders of the defined types were placed in this encounter.     Procedures: Large Joint Inj: L glenohumeral on 04/04/2023 6:19 AM Indications: diagnostic evaluation and pain Details: 22 G 3.5 in needle, ultrasound-guided posterior approach  Arthrogram: No  Medications: 9 mL bupivacaine 0.5 %; 40 mg methylPREDNISolone acetate 40 MG/ML; 5 mL lidocaine 1 % Outcome: tolerated well, no immediate complications Procedure, treatment alternatives, risks and benefits explained, specific risks discussed. Consent was given by the patient. Immediately prior to procedure a time out was called to verify the correct patient, procedure, equipment, support staff and site/side marked as required. Patient was prepped and draped in the usual sterile fashion.       Clinical Data: No additional findings.  Objective: Vital Signs: There were no vitals taken for this visit.  Physical Exam:  Constitutional: Patient appears well-developed HEENT:  Head: Normocephalic Eyes:EOM are normal Neck: Normal range of motion Cardiovascular: Normal rate Pulmonary/chest: Effort normal Neurologic: Patient is alert Skin: Skin is warm Psychiatric: Patient has normal mood and affect  Ortho Exam: Ortho exam demonstrates on the left-hand side forward flexion of about 90 and isolated glenohumeral abduction of less than 90 degrees.  Range of motion passively on  the left is 30/85/120.  Range of motion on the right is 70/110/175.  Rotator cuff strength is actually pretty reasonable to infraspinatus supraspinatus and subscap muscle testing.  She has distinct coarseness and grinding in her shoulder consistent with bone-on-bone glenohumeral articulation.  Specialty Comments:  EXAM: MRI LUMBAR SPINE WITHOUT CONTRAST   TECHNIQUE: Multiplanar, multisequence MR imaging of the lumbar spine was performed. No intravenous contrast was administered.    COMPARISON:  Lumbar spine radiographs 02/05/2022, lumbar spine MRI 10/10/2018   FINDINGS: Segmentation: Standard; the lowest formed disc space is designated L5-S1.   Alignment: There is dextrocurvature centered at L2-L3 which appears increased since 2020. Trace retrolisthesis of T12 on L1 and trace anterolisthesis of L4 on L5 are unchanged.   Vertebrae: Vertebral body heights are preserved. Background marrow signal is normal. There is no suspicious marrow signal abnormality or marrow edema.   Conus medullaris and cauda equina: Conus extends to the L1 level. Conus and cauda equina appear normal. An incidental conjoined right L5-S1 nerve root is noted. 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   Paraspinal and other soft tissues: Unremarkable.   Disc levels:   There is mild disc desiccation and narrowing at T12-L1. There is otherwise overall mild desiccation without significant loss of height.   T12-L1: There is a mild disc bulge without significant spinal canal or neural foraminal stenosis, unchanged.   L1-L2: No significant spinal canal or neural foraminal stenosis   L2-L3: There is a mild disc bulge which is minimally increased since 2020 but without significant spinal canal or neural foraminal stenosis   L3-L4: There is a mild disc bulge with a superimposed right foraminal/extraforaminal protrusion which comes in close proximity to the exiting L3 nerve root but without significant spinal canal or neural foraminal stenosis, unchanged.   L4-L5: There is trace anterolisthesis, and advanced left worse than right facet arthropathy with effusions resulting in mild left subarticular zone narrowing and no significant neural foraminal stenosis. The left subarticular zone narrowing is slightly worsened since 2020.   L5-S1: There is moderate bilateral facet arthropathy without significant spinal canal or neural foraminal stenosis, not significantly changed.   IMPRESSION: 1.  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 2. Moderate facet arthropathy at L5-S1 without significant spinal canal or neural foraminal stenosis, not significantly changed. 3. Right foraminal/extraforaminal protrusion at L3-L4 which comes in close proximity to the exiting L3 nerve root, unchanged. 4. Dextrocurvature centered at L2-L3 appears increased since 2020.     Electronically Signed   By: Lesia Hausen M.D.   On: 05/29/2022 17:08  Imaging: US Guided Needle Placement - No Linked Charges Result Date: 04/05/2023 Ultrasound imaging demonstrates needle placement into the glenohumeral joint with injection of fluid into the joint and no complicating features. Left shoulder    PMFS History: Patient Active Problem List   Diagnosis Date Noted   Status post right knee replacement 09/16/2022   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/14/2022   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   BMI 40.0-44.9, adult (HCC)-current bmi 37.3 03/20/2022   Morbid obesity (HCC) 01/30/2022   BMI 37.0-37.9, adult 01/30/2022   B12 deficiency due to diet 09/27/2021   SOB (shortness of breath) on exertion 09/27/2021   Chronic vertigo 09/27/2021   Other hyperlipidemia 08/28/2021   Vitamin D deficiency 08/28/2021   Pre-diabetes 06/28/2021   Sepsis (HCC) 05/15/2021   Hypoxia 05/15/2021  Essential hypertension 05/15/2021   DM (diabetes mellitus), type 2 (HCC) 05/15/2021   Hypomagnesemia 05/15/2021   UTI (urinary tract infection) 05/14/2021   Acute exacerbation of chronic low back pain 03/10/2021   OSA on CPAP 09/03/2018   Mild persistent asthma without complication 09/03/2018   Class 3 severe obesity with serious comorbidity and body mass index (BMI) of 40.0 to 44.9 in adult (HCC) 04/14/2018   Unilateral primary osteoarthritis, left knee 04/14/2018    Asthma in adult, mild intermittent, uncomplicated 02/26/2017   Allergic rhinitis 09/05/2013   Past Medical History:  Diagnosis Date   Anxiety    Arthritis    Asthma    Back pain    Bilateral swelling of feet    Constipation    Depression    Diabetes mellitus without complication (HCC)    Gallbladder problem    GERD (gastroesophageal reflux disease)    History of low potassium    Hypertension    Joint pain    Obesity    Palpitations    Shortness of breath    Sleep apnea     Family History  Problem Relation Age of Onset   Diabetes Mother    Heart disease Mother    Anxiety disorder Mother    Obesity Mother    Diabetes Father    Hypertension Father    Obesity Father     Past Surgical History:  Procedure Laterality Date   ABDOMINAL HYSTERECTOMY     APPENDECTOMY     CATARACT EXTRACTION, BILATERAL     CHOLECYSTECTOMY     KNEE SURGERY Left    arthroscopy   SHOULDER SURGERY Right    TOTAL KNEE ARTHROPLASTY Right 09/14/2022   Procedure: RIGHT TOTAL KNEE ARTHROPLASTY;  Surgeon: Kathryne Hitch, MD;  Location: [facility];  Service: Orthopedics;  Laterality: Right;   Social History   Occupational History   Not on file  Tobacco Use   Smoking status: Never    Passive exposure: Never   Smokeless tobacco: Never  Vaping Use   Vaping status: Never Used  Substance and Sexual Activity   Alcohol use: No    Alcohol/week: 0.0 standard drinks of alcohol   Drug use: No   Sexual activity: Yes

## 2023-04-05 NOTE — Telephone Encounter (Signed)
 Scheduled

## 2023-04-05 NOTE — Telephone Encounter (Signed)
 Needs 68mo follow up for injection appt

## 2023-04-09 DIAGNOSIS — R3914 Feeling of incomplete bladder emptying: Secondary | ICD-10-CM | POA: Diagnosis not present

## 2023-04-09 DIAGNOSIS — N3000 Acute cystitis without hematuria: Secondary | ICD-10-CM | POA: Diagnosis not present

## 2023-04-10 ENCOUNTER — Encounter: Payer: Self-pay | Admitting: Orthopaedic Surgery

## 2023-04-10 ENCOUNTER — Ambulatory Visit: Payer: PPO | Admitting: Orthopaedic Surgery

## 2023-04-10 ENCOUNTER — Other Ambulatory Visit (INDEPENDENT_AMBULATORY_CARE_PROVIDER_SITE_OTHER): Payer: Self-pay

## 2023-04-10 DIAGNOSIS — Z96651 Presence of right artificial knee joint: Secondary | ICD-10-CM

## 2023-04-10 NOTE — Progress Notes (Signed)
 The patient is a 78 year old female who is now 6 months status post a right total knee arthroplasty.  Over the last few months we have had her go slower because is concerned about fragmentation of her patella.  She had a very thin patella.  She says the knee still hurts on a constant basis and she feels unstable so she does walk with a cane.  On examination of her right knee today she can fully extend her knee on her own and her extensor mechanism is intact.  There is no knee joint effusion.  She has good flexion as well in the knee feels ligamentously stable.  2 views of the right knee show the femoral and tibial components are well-seated.  The patella component is also well-seated and I believe the patella is filling in nicely and is in a normal position.  This point I would like to now get her back into physical therapy to work on strengthening her right knee due to her perceived instability of that knee.  Will then see her back in about 8 weeks after course of physical therapy on that right knee.

## 2023-04-11 ENCOUNTER — Other Ambulatory Visit: Payer: Self-pay

## 2023-04-11 DIAGNOSIS — Z96651 Presence of right artificial knee joint: Secondary | ICD-10-CM

## 2023-04-17 DIAGNOSIS — Z1231 Encounter for screening mammogram for malignant neoplasm of breast: Secondary | ICD-10-CM | POA: Diagnosis not present

## 2023-04-24 ENCOUNTER — Encounter: Payer: Self-pay | Admitting: Physical Therapy

## 2023-04-24 ENCOUNTER — Ambulatory Visit: Admitting: Physical Therapy

## 2023-04-24 DIAGNOSIS — G8929 Other chronic pain: Secondary | ICD-10-CM | POA: Diagnosis not present

## 2023-04-24 DIAGNOSIS — G4733 Obstructive sleep apnea (adult) (pediatric): Secondary | ICD-10-CM | POA: Diagnosis not present

## 2023-04-24 DIAGNOSIS — M6281 Muscle weakness (generalized): Secondary | ICD-10-CM

## 2023-04-24 DIAGNOSIS — R262 Difficulty in walking, not elsewhere classified: Secondary | ICD-10-CM | POA: Diagnosis not present

## 2023-04-24 DIAGNOSIS — M25661 Stiffness of right knee, not elsewhere classified: Secondary | ICD-10-CM | POA: Diagnosis not present

## 2023-04-24 DIAGNOSIS — M25561 Pain in right knee: Secondary | ICD-10-CM | POA: Diagnosis not present

## 2023-04-24 NOTE — Therapy (Signed)
 OUTPATIENT PHYSICAL THERAPY EVALUATION   Patient Name: Crystal Jackson MRN: [id number] DOB:Sep 13, 1945, 78 y.o., female Today's Date: 04/24/2023  END OF SESSION:  PT End of Session - 04/24/23 1418     Visit Number 1    Number of Visits 12    Date for PT Re-Evaluation 06/05/23    Authorization Type [employer]    Progress Note Due on Visit 10    PT Start Time 1346    PT Stop Time 1413    PT Time Calculation (min) 27 min    Activity Tolerance Patient tolerated treatment well    Behavior During Therapy WFL for tasks assessed/performed             Past Medical History:  Diagnosis Date   Anxiety    Arthritis    Asthma    Back pain    Bilateral swelling of feet    Constipation    Depression    Diabetes mellitus without complication (HCC)    Gallbladder problem    GERD (gastroesophageal reflux disease)    History of low potassium    Hypertension    Joint pain    Obesity    Palpitations    Shortness of breath    Sleep apnea    Past Surgical History:  Procedure Laterality Date   ABDOMINAL HYSTERECTOMY     APPENDECTOMY     CATARACT EXTRACTION, BILATERAL     CHOLECYSTECTOMY     KNEE SURGERY Left    arthroscopy   SHOULDER SURGERY Right    TOTAL KNEE ARTHROPLASTY Right 09/14/2022   Procedure: RIGHT TOTAL KNEE ARTHROPLASTY;  Surgeon: Arnie Lao, MD;  Location: [facility];  Service: Orthopedics;  Laterality: Right;   Patient Active Problem List   Diagnosis Date Noted   Status post right knee replacement 09/16/2022   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/14/2022   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   BMI 40.0-44.9, adult (HCC)-current bmi 37.3 03/20/2022   Morbid obesity (HCC) 01/30/2022   BMI 37.0-37.9, adult 01/30/2022   B12 deficiency due to diet 09/27/2021   SOB (shortness of breath) on exertion 09/27/2021   Chronic vertigo 09/27/2021   Other hyperlipidemia 08/28/2021   Vitamin D  deficiency 08/28/2021   Pre-diabetes 06/28/2021    Sepsis (HCC) 05/15/2021   Hypoxia 05/15/2021   Essential hypertension 05/15/2021   DM (diabetes mellitus), type 2 (HCC) 05/15/2021   Hypomagnesemia 05/15/2021   UTI (urinary tract infection) 05/14/2021   Acute exacerbation of chronic low back pain 03/10/2021   OSA on CPAP 09/03/2018   Mild persistent asthma without complication 09/03/2018   Class 3 severe obesity with serious comorbidity and body mass index (BMI) of 40.0 to 44.9 in adult [facility]) 04/14/2018   Unilateral primary osteoarthritis, left knee 04/14/2018   Asthma in adult, mild intermittent, uncomplicated 02/26/2017   Allergic rhinitis 09/05/2013    PCP: Azalia Leo, MD  REFERRING PROVIDER: Arnie Lao*  REFERRING DIAG: [phone] (ICD-10-CM) - History of total right knee replacement  Rationale for Evaluation and Treatment: Rehabilitation  THERAPY DIAG:  Chronic pain of right knee - Plan: PT plan of care cert/re-cert  Muscle weakness (generalized) - Plan: PT plan of care cert/re-cert  Stiffness of right knee, not elsewhere classified - Plan: PT plan of care cert/re-cert  Difficulty in walking, not elsewhere classified - Plan: PT plan of care cert/re-cert  ONSET DATE: DOS: 09/14/2022; Injury 12/2022   SUBJECTIVE:  SUBJECTIVE STATEMENT: Pt reports an episode of "popping" in her knee in Dec 2024.  That turned out to be a patella fx, which is now mostly healed.  Since the injury her mobility has been pretty limited so she feels like she needs to get stronger and improve her mobility.  PERTINENT HISTORY:  Anxiety, arthritis, back pain, DM, depression, GERD, HTN. Seen by clinic in past.   PAIN:  Are you having pain? No  PRECAUTIONS:  None  RED FLAGS: None   WEIGHT BEARING RESTRICTIONS:  No  FALLS:  Has patient fallen  in last 6 months? No  LIVING ENVIRONMENT: Lives with: lives with their spouse Lives in: House/apartment Stairs: Yes: External: 6 steps; can reach both Has following equipment at home: Single point cane and Walker - 2 wheeled  OCCUPATION:  Retired from nursing  PLOF:  Independent and Leisure: gardening; walking, reading  PATIENT GOALS:  Walk steady and improve balance, negotiate stairs   OBJECTIVE:  Note: Objective measures were completed at Evaluation unless otherwise noted.  PATIENT SURVEYS:  Patient-Specific Activity Scoring Scheme  "0" represents "unable to perform." "10" represents "able to perform at prior level. 0 1 2 3 4 5 6 7 8 9  10 (Date and Score)   Activity Eval     1. Walking 6     2. Stairs 3     3. Gardening 2   Score 3.67    Total score = sum of the activity scores/number of activities Minimum detectable change (90%CI) for average score = 2 points Minimum detectable change (90%CI) for single activity score = 3 points    COGNITIVE STATUS: Within functional limits for tasks assessed   SENSATION: WFL   GAIT: 04/24/23 Comments: decreased step length bil; decreased foot clearance, bil trunk lean in stance    LOWER EXTREMITY ROM:     ROM Right eval  Knee flexion 92  (sitting)  Knee extension 0   (Blank rows = not tested)   LOWER EXTREMITY MMT:    MMT Right eval Left eval  Hip flexion 3+/5 4/5  Knee flexion 4/5 5/5  Knee extension 4/5 4/5   (Blank rows = not tested)     FUNCTIONAL TESTS:  04/24/23 5 times sit to stand: 20.03 sec without UE support Timed up and go (TUG): 17.62 sec Gait velocity: 2.46 ft/sec   TREATMENT:                                                                                                                              DATE:  04/24/23 TherEx See HEP - demonstrated with trial reps performed PRN, min cues for comprehension    Self Care Educated on PT POC and clinical findings     PATIENT EDUCATION:   Education details: HEP, POC Person educated: Patient Education method: Programmer, multimedia, Demonstration, and Handouts Education comprehension: verbalized understanding, returned demonstration, and needs further education  HOME EXERCISE PROGRAM: Access Code: [email or web address] URL: [email or web address] Date:  04/24/2023 Prepared by: Casimer Clear  Exercises - Supine Heel Slide  - 2-3 x daily - 7 x weekly - 1 sets - 10 reps - 5 hold - Seated Long Arc Quad  - 2-3 x daily - 7 x weekly - 1 sets - 5-10 reps - 2 hold - Seated Quad Set  - 2-3 x daily - 7 x weekly - 1 sets - 10 reps - 5 hold - Seated Straight Leg Heel Taps  - 2-3 x daily - 7 x weekly - 1 sets - 10 reps - Standing Hip Abduction with Counter Support  - 1-2 x daily - 7 x weekly - 1 sets - 20 reps - Standing Hip Extension with Counter Support  - 1-2 x daily - 7 x weekly - 1 sets - 20 reps   ASSESSMENT:  CLINICAL IMPRESSION: Patient is a 78 y.o. female who was seen today for physical therapy evaluation and treatment for continued knee pain and instability since TKA. She demonstrates decreased ROM, strength and balance as well as mild gait abnormalities affecting functional mobility.  She will benefit from PT to address deficits listed.     OBJECTIVE IMPAIRMENTS: Abnormal gait, decreased balance, decreased mobility, difficulty walking, decreased ROM, decreased strength, and pain.   ACTIVITY LIMITATIONS: carrying, lifting, squatting, stairs, transfers, bathing, and locomotion level  PARTICIPATION LIMITATIONS: meal prep, cleaning, laundry, driving, shopping, community activity, and yard work  PERSONAL FACTORS: 3+ comorbidities: Anxiety, arthritis, back pain, DM, depression, GERD, HTN. Seen by clinic in past.   are also affecting patient's functional outcome.   REHAB POTENTIAL: Good  CLINICAL DECISION MAKING: Evolving/moderate complexity  EVALUATION COMPLEXITY: Moderate   GOALS: Goals reviewed with patient? Yes  SHORT  TERM GOALS: Target date: 05/15/2023  Independent with initial HEP Goal status: INITIAL  LONG TERM GOALS: Target date: 06/05/2023  Independent with final HEP Goal status: INITIAL  2.  PSFS score improved by 3 points Goal status: INITIAL  3.  Rt knee flexion improved to 100 deg for improved mobility Goal status: INIITAL  4.  5x STS improved to < 15 sec for improved functional strength and mobility Goal status: INITIAL  5.  Improve TUG to <13 sec for improved balance and decreased fall risk Goal status: INITIAL      PLAN:  PT FREQUENCY: 1-2x/week  PT DURATION: 6 weeks  PLANNED INTERVENTIONS: 97164- PT Re-evaluation, 97750- Physical Performance Testing, 97110-Therapeutic exercises, 97530- Therapeutic activity, [id number]- Neuromuscular re-education, 97535- Self Care, [id number]- Manual therapy, [id number]- Gait training, [phone]- Aquatic Therapy, [phone]- Electrical stimulation (unattended), 97016- Vasopneumatic device, Patient/Family education, Balance training, Stair training, Taping, Dry Needling, Joint mobilization, Cryotherapy, and Moist heat.  PLAN FOR NEXT SESSION: Review HEP, continue with hip/knee strengthening as able   NEXT MD VISIT: 06/12/23   Marley Simmers, PT, DPT 04/24/23 2:20 PM

## 2023-04-26 DIAGNOSIS — G4733 Obstructive sleep apnea (adult) (pediatric): Secondary | ICD-10-CM | POA: Diagnosis not present

## 2023-04-29 ENCOUNTER — Encounter: Payer: Self-pay | Admitting: Orthopedic Surgery

## 2023-04-29 DIAGNOSIS — N952 Postmenopausal atrophic vaginitis: Secondary | ICD-10-CM | POA: Diagnosis not present

## 2023-04-29 DIAGNOSIS — Z90711 Acquired absence of uterus with remaining cervical stump: Secondary | ICD-10-CM | POA: Diagnosis not present

## 2023-04-29 DIAGNOSIS — Z01411 Encounter for gynecological examination (general) (routine) with abnormal findings: Secondary | ICD-10-CM | POA: Diagnosis not present

## 2023-04-30 NOTE — Telephone Encounter (Signed)
 Sure passive stretching and hep thx

## 2023-05-02 ENCOUNTER — Ambulatory Visit: Admitting: Physical Therapy

## 2023-05-02 ENCOUNTER — Encounter: Payer: Self-pay | Admitting: Physical Therapy

## 2023-05-02 DIAGNOSIS — M6281 Muscle weakness (generalized): Secondary | ICD-10-CM | POA: Diagnosis not present

## 2023-05-02 DIAGNOSIS — M25661 Stiffness of right knee, not elsewhere classified: Secondary | ICD-10-CM

## 2023-05-02 DIAGNOSIS — R600 Localized edema: Secondary | ICD-10-CM

## 2023-05-02 DIAGNOSIS — R262 Difficulty in walking, not elsewhere classified: Secondary | ICD-10-CM

## 2023-05-02 NOTE — Therapy (Signed)
 OUTPATIENT PHYSICAL THERAPY TREATMENT   Patient Name: Crystal Jackson MRN: [id number] DOB:1945-09-16, 78 y.o., female Today's Date: 05/02/2023  END OF SESSION:  PT End of Session - 05/02/23 1403     Visit Number 2    Number of Visits 12    Date for PT Re-Evaluation 06/05/23    Authorization Type [employer]    Progress Note Due on Visit 10    PT Start Time 1300    PT Stop Time 1345    PT Time Calculation (min) 45 min    Activity Tolerance Patient tolerated treatment well    Behavior During Therapy WFL for tasks assessed/performed              Past Medical History:  Diagnosis Date   Anxiety    Arthritis    Asthma    Back pain    Bilateral swelling of feet    Constipation    Depression    Diabetes mellitus without complication (HCC)    Gallbladder problem    GERD (gastroesophageal reflux disease)    History of low potassium    Hypertension    Joint pain    Obesity    Palpitations    Shortness of breath    Sleep apnea    Past Surgical History:  Procedure Laterality Date   ABDOMINAL HYSTERECTOMY     APPENDECTOMY     CATARACT EXTRACTION, BILATERAL     CHOLECYSTECTOMY     KNEE SURGERY Left    arthroscopy   SHOULDER SURGERY Right    TOTAL KNEE ARTHROPLASTY Right 09/14/2022   Procedure: RIGHT TOTAL KNEE ARTHROPLASTY;  Surgeon: Arnie Lao, MD;  Location: [facility];  Service: Orthopedics;  Laterality: Right;   Patient Active Problem List   Diagnosis Date Noted   Status post right knee replacement 09/16/2022   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/14/2022   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   BMI 40.0-44.9, adult (HCC)-current bmi 37.3 03/20/2022   Morbid obesity (HCC) 01/30/2022   BMI 37.0-37.9, adult 01/30/2022   B12 deficiency due to diet 09/27/2021   SOB (shortness of breath) on exertion 09/27/2021   Chronic vertigo 09/27/2021   Other hyperlipidemia 08/28/2021   Vitamin D  deficiency 08/28/2021   Pre-diabetes 06/28/2021    Sepsis (HCC) 05/15/2021   Hypoxia 05/15/2021   Essential hypertension 05/15/2021   DM (diabetes mellitus), type 2 (HCC) 05/15/2021   Hypomagnesemia 05/15/2021   UTI (urinary tract infection) 05/14/2021   Acute exacerbation of chronic low back pain 03/10/2021   OSA on CPAP 09/03/2018   Mild persistent asthma without complication 09/03/2018   Class 3 severe obesity with serious comorbidity and body mass index (BMI) of 40.0 to 44.9 in adult (HCC) 04/14/2018   Unilateral primary osteoarthritis, left knee 04/14/2018   Asthma in adult, mild intermittent, uncomplicated 02/26/2017   Allergic rhinitis 09/05/2013    PCP: Azalia Leo, MD  REFERRING PROVIDER: Arnie Lao*  REFERRING DIAG: [phone] (ICD-10-CM) - History of total right knee replacement  Rationale for Evaluation and Treatment: Rehabilitation  THERAPY DIAG:  Muscle weakness (generalized)  Stiffness of right knee, not elsewhere classified  Difficulty in walking, not elsewhere classified  Localized edema  ONSET DATE: DOS: 09/14/2022; Injury 12/2022   SUBJECTIVE:  SUBJECTIVE STATEMENT: Pt reports an episode of "popping" in her knee in Dec 2024.  That turned out to be a patella fx, which is now mostly healed.  Since the injury her mobility has been pretty limited so she feels like she needs to get stronger and improve her mobility.  PERTINENT HISTORY:  Anxiety, arthritis, back pain, DM, depression, GERD, HTN. Seen by clinic in past.   PAIN:  Are you having pain? No  PRECAUTIONS:  None  RED FLAGS: None   WEIGHT BEARING RESTRICTIONS:  No  FALLS:  Has patient fallen in last 6 months? No  LIVING ENVIRONMENT: Lives with: lives with their spouse Lives in: House/apartment Stairs: Yes: External: 6 steps; can reach both Has  following equipment at home: Single point cane and Walker - 2 wheeled  OCCUPATION:  Retired from nursing  PLOF:  Independent and Leisure: gardening; walking, reading  PATIENT GOALS:  Walk steady and improve balance, negotiate stairs   OBJECTIVE:  Note: Objective measures were completed at Evaluation unless otherwise noted.  PATIENT SURVEYS:  Patient-Specific Activity Scoring Scheme  "0" represents "unable to perform." "10" represents "able to perform at prior level. 0 1 2 3 4 5 6 7 8 9  10 (Date and Score)   Activity Eval     1. Walking 6     2. Stairs 3     3. Gardening 2   Score 3.67    Total score = sum of the activity scores/number of activities Minimum detectable change (90%CI) for average score = 2 points Minimum detectable change (90%CI) for single activity score = 3 points    COGNITIVE STATUS: Within functional limits for tasks assessed   SENSATION: WFL   GAIT: 04/24/23 Comments: decreased step length bil; decreased foot clearance, bil trunk lean in stance    LOWER EXTREMITY ROM:     ROM Right eval  Knee flexion 92  (sitting)  Knee extension 0   (Blank rows = not tested)   LOWER EXTREMITY MMT:    MMT Right eval Left eval  Hip flexion 3+/5 4/5  Knee flexion 4/5 5/5  Knee extension 4/5 4/5   (Blank rows = not tested)     FUNCTIONAL TESTS:  04/24/23 5 times sit to stand: 20.03 sec without UE support Timed up and go (TUG): 17.62 sec Gait velocity: 2.46 ft/sec   TREATMENT:                                                                                                                              DATE:  05/02/23 TherEx Nu step L6 LE only X 8 min Gastroc stretch 30 sec X 3 bilat Supine heel slides AAROM 5 sec X 10 Supine SLR on Rt, X 10 reps Seated LAQ on Rt, 2X10 Seated hamstring curls red 2X10 Theractivity Leg press DL 16# 1W96, then Rt leg only 31# 2X10, attempted Lt leg only at 31# and at 25# and was painful so  discontinued. Standing hip abductions X 10 bilat Standing hip extensions X 10 bilat  04/24/23 TherEx See HEP - demonstrated with trial reps performed PRN, min cues for comprehension    Self Care Educated on PT POC and clinical findings     PATIENT EDUCATION:  Education details: HEP, POC Person educated: Patient Education method: Programmer, multimedia, Demonstration, and Handouts Education comprehension: verbalized understanding, returned demonstration, and needs further education  HOME EXERCISE PROGRAM: Access Code: [email or web address] URL: [email or web address] Date: 04/24/2023 Prepared by: Casimer Clear  Exercises - Supine Heel Slide  - 2-3 x daily - 7 x weekly - 1 sets - 10 reps - 5 hold - Seated Long Arc Quad  - 2-3 x daily - 7 x weekly - 1 sets - 5-10 reps - 2 hold - Seated Quad Set  - 2-3 x daily - 7 x weekly - 1 sets - 10 reps - 5 hold - Seated Straight Leg Heel Taps  - 2-3 x daily - 7 x weekly - 1 sets - 10 reps - Standing Hip Abduction with Counter Support  - 1-2 x daily - 7 x weekly - 1 sets - 20 reps - Standing Hip Extension with Counter Support  - 1-2 x daily - 7 x weekly - 1 sets - 20 reps   ASSESSMENT:  CLINICAL IMPRESSION: Session focused on ROM and strength of Right knee. She showed good overall understanding and return demo for HEP exercises today. Continue with PT plan.    OBJECTIVE IMPAIRMENTS: Abnormal gait, decreased balance, decreased mobility, difficulty walking, decreased ROM, decreased strength, and pain.   ACTIVITY LIMITATIONS: carrying, lifting, squatting, stairs, transfers, bathing, and locomotion level  PARTICIPATION LIMITATIONS: meal prep, cleaning, laundry, driving, shopping, community activity, and yard work  PERSONAL FACTORS: 3+ comorbidities: Anxiety, arthritis, back pain, DM, depression, GERD, HTN. Seen by clinic in past.   are also affecting patient's functional outcome.   REHAB POTENTIAL: Good  CLINICAL DECISION MAKING:  Evolving/moderate complexity  EVALUATION COMPLEXITY: Moderate   GOALS: Goals reviewed with patient? Yes  SHORT TERM GOALS: Target date: 05/15/2023  Independent with initial HEP Goal status: INITIAL  LONG TERM GOALS: Target date: 06/05/2023  Independent with final HEP Goal status: INITIAL  2.  PSFS score improved by 3 points Goal status: INITIAL  3.  Rt knee flexion improved to 100 deg for improved mobility Goal status: INIITAL  4.  5x STS improved to < 15 sec for improved functional strength and mobility Goal status: INITIAL  5.  Improve TUG to <13 sec for improved balance and decreased fall risk Goal status: INITIAL      PLAN:  PT FREQUENCY: 1-2x/week  PT DURATION: 6 weeks  PLANNED INTERVENTIONS: 97164- PT Re-evaluation, 97750- Physical Performance Testing, 97110-Therapeutic exercises, 97530- Therapeutic activity, [id number]- Neuromuscular re-education, 97535- Self Care, 40981- Manual therapy, [id number]- Gait training, [phone]- Aquatic Therapy, [phone]- Electrical stimulation (unattended), 97016- Vasopneumatic device, Patient/Family education, Balance training, Stair training, Taping, Dry Needling, Joint mobilization, Cryotherapy, and Moist heat.  PLAN FOR NEXT SESSION: continue with hip/knee strengthening as able   NEXT MD VISIT: 06/12/23   Jamee Mazzoni, PT, DPT 05/02/23 2:04 PM

## 2023-05-03 ENCOUNTER — Ambulatory Visit (HOSPITAL_BASED_OUTPATIENT_CLINIC_OR_DEPARTMENT_OTHER): Admitting: Pulmonary Disease

## 2023-05-03 ENCOUNTER — Encounter (HOSPITAL_BASED_OUTPATIENT_CLINIC_OR_DEPARTMENT_OTHER): Payer: Self-pay | Admitting: Pulmonary Disease

## 2023-05-03 VITALS — BP 138/48 | HR 75 | Ht 64.0 in | Wt 221.4 lb

## 2023-05-03 DIAGNOSIS — J4489 Other specified chronic obstructive pulmonary disease: Secondary | ICD-10-CM

## 2023-05-03 DIAGNOSIS — J4531 Mild persistent asthma with (acute) exacerbation: Secondary | ICD-10-CM

## 2023-05-03 MED ORDER — AZITHROMYCIN 250 MG PO TABS
250.0000 mg | ORAL_TABLET | Freq: Every day | ORAL | 0 refills | Status: AC
Start: 2023-05-03 — End: ?

## 2023-05-03 MED ORDER — PREDNISONE 10 MG PO TABS: ORAL_TABLET | ORAL | 0 refills | Status: DC

## 2023-05-03 NOTE — Progress Notes (Signed)
   Subjective:    Patient ID: Crystal Jackson, female    DOB: [birth year], 78 y.o.   MRN: [id number]  HPI  78 year old never smoker for follow-up of asthmatic bronchitis PMH : GERD  Elzabeth Jackson "[name]" is a 78 year old female with asthmatic bronchitis who presents with worsening respiratory symptoms. Her primary pulmonologist is Doctor Mannam.  Symptoms began on Tuesday with sneezing and rhinorrhea, initially attributed to allergies. By the following day, she developed a productive cough with yellowish sputum and dyspnea. Significant nasal discharge is present.  She has experienced similar episodes previously but has been stable for some time. Her last medical visit for a similar issue was about a year ago, though she cannot recall the specific medication used.  She uses Arnuity year-round, with discontinuation leading to a cough. Albuterol  is also used, and she has a recently replaced CPAP machine. Intermittent wheezing is noted. There has been no recent contact with sick individuals.  Significant tests/ events reviewed  PFTs 7/44/17 FVC 2.54 [83%], FEV1 1.92 [83%), F/F 76, TLC 89%, DLCO 72%. Minimal obstructive lung disease with mild reduction in diffusion capacity that corrects for alveolar volume.   FENO 04/15/17-18   Sleep study 07/20/15 Mild OSA, AHI 11.4. Low O2 sats of 71%.  Review of Systems neg for any significant sore throat, dysphagia, itching, sneezing, nasal congestion or excess/ purulent secretions, fever, chills, sweats, unintended wt loss, pleuritic or exertional cp, hempoptysis, orthopnea pnd or change in chronic leg swelling. Also denies presyncope, palpitations, heartburn, abdominal pain, nausea, vomiting, diarrhea or change in bowel or urinary habits, dysuria,hematuria, rash, arthralgias, visual complaints, headache, numbness weakness or ataxia.     Objective:   Physical Exam  Gen. Pleasant, obese, in no distress ENT - no lesions, mild erythema + post nasal  drip Neck: No JVD, no thyromegaly, no carotid bruits Lungs: no use of accessory muscles, no dullness to percussion, decreased without rales or rhonchi  Cardiovascular: Rhythm regular, heart sounds  normal, no murmurs or gallops, no peripheral edema Musculoskeletal: No deformities, no cyanosis or clubbing , no tremors       Assessment & Plan:   Asthmatic bronchitis Acute exacerbation with sneezing, rhinorrhea, productive cough with yellowish sputum, and dyspnea. Symptoms began Tuesday, worsened by Thursday. Lungs clear on examination. No chest x-ray needed. Intermittent wheezing. No recent sick contacts. Allergic to penicillin (rash). - Prescribe azithromycin (Z-Pak) for 5 days. - Prescribe prednisone  to start immediately due to wheezing and exacerbations. - Continue Arnuity as maintenance therapy. - Use albuterol  inhaler as needed for wheezing. - Consider over-the-counter cough syrup such as Mucinex for symptomatic relief. - Advise to call if symptoms do not improve or worsen.

## 2023-05-03 NOTE — Patient Instructions (Addendum)
 Zpak Prednisone  10 mg tabs  Take 2 tabs daily with food x 5ds, then 1 tab daily with food x 5ds then STOP   Continue Arnuity & albuterol   Call as needed

## 2023-05-07 ENCOUNTER — Encounter: Payer: Self-pay | Admitting: Rehabilitative and Restorative Service Providers"

## 2023-05-07 ENCOUNTER — Ambulatory Visit: Admitting: Rehabilitative and Restorative Service Providers"

## 2023-05-07 DIAGNOSIS — M6281 Muscle weakness (generalized): Secondary | ICD-10-CM

## 2023-05-07 DIAGNOSIS — M25561 Pain in right knee: Secondary | ICD-10-CM | POA: Diagnosis not present

## 2023-05-07 DIAGNOSIS — R262 Difficulty in walking, not elsewhere classified: Secondary | ICD-10-CM | POA: Diagnosis not present

## 2023-05-07 DIAGNOSIS — M25661 Stiffness of right knee, not elsewhere classified: Secondary | ICD-10-CM

## 2023-05-07 DIAGNOSIS — G8929 Other chronic pain: Secondary | ICD-10-CM | POA: Diagnosis not present

## 2023-05-07 NOTE — Therapy (Signed)
 OUTPATIENT PHYSICAL THERAPY TREATMENT   Patient Name: Crystal Jackson MRN: [id number] DOB:17-Oct-1945, 78 y.o., female Today's Date: 05/07/2023  END OF SESSION:  PT End of Session - 05/07/23 1515     Visit Number 3    Number of Visits 12    Date for PT Re-Evaluation 06/05/23    Authorization Type [employer]    Progress Note Due on Visit 10    PT Start Time 1508    PT Stop Time 1547    PT Time Calculation (min) 39 min    Activity Tolerance Patient tolerated treatment well    Behavior During Therapy WFL for tasks assessed/performed               Past Medical History:  Diagnosis Date   Anxiety    Arthritis    Asthma    Back pain    Bilateral swelling of feet    Constipation    Depression    Diabetes mellitus without complication (HCC)    Gallbladder problem    GERD (gastroesophageal reflux disease)    History of low potassium    Hypertension    Joint pain    Obesity    Palpitations    Shortness of breath    Sleep apnea    Past Surgical History:  Procedure Laterality Date   ABDOMINAL HYSTERECTOMY     APPENDECTOMY     CATARACT EXTRACTION, BILATERAL     CHOLECYSTECTOMY     KNEE SURGERY Left    arthroscopy   SHOULDER SURGERY Right    TOTAL KNEE ARTHROPLASTY Right 09/14/2022   Procedure: RIGHT TOTAL KNEE ARTHROPLASTY;  Surgeon: Arnie Lao, MD;  Location: [facility];  Service: Orthopedics;  Laterality: Right;   Patient Active Problem List   Diagnosis Date Noted   Status post right knee replacement 09/16/2022   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/14/2022   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   BMI 40.0-44.9, adult (HCC)-current bmi 37.3 03/20/2022   Morbid obesity (HCC) 01/30/2022   BMI 37.0-37.9, adult 01/30/2022   B12 deficiency due to diet 09/27/2021   SOB (shortness of breath) on exertion 09/27/2021   Chronic vertigo 09/27/2021   Other hyperlipidemia 08/28/2021   Vitamin D  deficiency 08/28/2021   Pre-diabetes 06/28/2021    Sepsis (HCC) 05/15/2021   Hypoxia 05/15/2021   Essential hypertension 05/15/2021   DM (diabetes mellitus), type 2 (HCC) 05/15/2021   Hypomagnesemia 05/15/2021   UTI (urinary tract infection) 05/14/2021   Acute exacerbation of chronic low back pain 03/10/2021   OSA on CPAP 09/03/2018   Mild persistent asthma without complication 09/03/2018   Class 3 severe obesity with serious comorbidity and body mass index (BMI) of 40.0 to 44.9 in adult 04/14/2018   Unilateral primary osteoarthritis, left knee 04/14/2018   Asthma in adult, mild intermittent, uncomplicated 02/26/2017   Allergic rhinitis 09/05/2013    PCP: Azalia Leo, MD  REFERRING PROVIDER: Arnie Lao*  REFERRING DIAG: [phone] (ICD-10-CM) - History of total right knee replacement  Rationale for Evaluation and Treatment: Rehabilitation  THERAPY DIAG:  Chronic pain of right knee  Muscle weakness (generalized)  Stiffness of right knee, not elsewhere classified  Difficulty in walking, not elsewhere classified  ONSET DATE: DOS: 09/14/2022; Injury 12/2022   SUBJECTIVE:  SUBJECTIVE STATEMENT: Pt indicated no knee pain upon arrival today.  Reported Lt shoulder has continued to be painful.   PERTINENT HISTORY:  Anxiety, arthritis, back pain, DM, depression, GERD, HTN. Seen by clinic in past.   PAIN:  Are you having pain? No  PRECAUTIONS:  None  RED FLAGS: None   WEIGHT BEARING RESTRICTIONS:  No  FALLS:  Has patient fallen in last 6 months? No  LIVING ENVIRONMENT: Lives with: lives with their spouse Lives in: House/apartment Stairs: Yes: External: 6 steps; can reach both Has following equipment at home: Single point cane and Walker - 2 wheeled  OCCUPATION:  Retired from nursing  PLOF:  Independent and Leisure:  gardening; walking, reading  PATIENT GOALS:  Walk steady and improve balance, negotiate stairs   OBJECTIVE:  Note: Objective measures were completed at Evaluation unless otherwise noted.  PATIENT SURVEYS:  Patient-Specific Activity Scoring Scheme  "0" represents "unable to perform." "10" represents "able to perform at prior level. 0 1 2 3 4 5 6 7 8 9  10 (Date and Score)   Activity Eval  04/24/23    1. Walking 6     2. Stairs 3     3. Gardening 2   Score 3.67    Total score = sum of the activity scores/number of activities Minimum detectable change (90%CI) for average score = 2 points Minimum detectable change (90%CI) for single activity score = 3 points  COGNITIVE STATUS: Within functional limits for tasks assessed   SENSATION: WFL   GAIT: 04/24/23 Comments: decreased step length bil; decreased foot clearance, bil trunk lean in stance  LOWER EXTREMITY ROM:     ROM Right Eval 04/24/23  Knee flexion 92  (sitting)  Knee extension 0   (Blank rows = not tested)   LOWER EXTREMITY MMT:    MMT Right Eval 04/24/23 Left Eval 04/24/23  Hip flexion 3+/5 4/5  Knee flexion 4/5 5/5  Knee extension 4/5 4/5   (Blank rows = not tested)   FUNCTIONAL TESTS:  05/07/2023: TUG independently:   15.59  04/24/23 5 times sit to stand: 20.03 sec without UE support Timed up and go (TUG): 17.62 sec Gait velocity: 2.46 ft/sec                   TREATMENT       DATE: 05/07/2023 Therex: Nustep 10 mins with lvl 6 for 5 mins and lvl 5 for 5 mins , LE only  Seated LAQ alternating x 10 bilateral  Seated quad set with SLR x 15 bilateral    Neuro Re-ed: (to improve balance, neuromuscular control/recruitment) Tandem stance 1 min x 2 bilateral in // bars with occasional HHA Alternating heel/toe lift x 15 each way    TherActivity (to improve transfers, stairs)  Leg press 62 lbs x 15 with slow lowering focus (increase weight next visit )  single leg Lt x 10 25 lbs, Rt 31 lbs x  15 Sit to stand to sit 18 inch chair x 10 with slow sitting.     TREATMENT       DATE: 05/02/23 TherEx Nu step L6 LE only X 8 min Gastroc stretch 30 sec X 3 bilat Supine heel slides AAROM 5 sec X 10 Supine SLR on Rt, X 10 reps Seated LAQ on Rt, 2X10 Seated hamstring curls red 2X10 Theractivity Leg press DL 59# [id number], then Rt leg only 31# 2X10, attempted Lt leg only at 31# and at 25# and was painful so discontinued. Standing  hip abductions X 10 bilat Standing hip extensions X 10 bilat  TREATMENT       DATE: 04/24/23 TherEx See HEP - demonstrated with trial reps performed PRN, min cues for comprehension    Self Care Educated on PT POC and clinical findings     PATIENT EDUCATION:  Education details: HEP, POC Person educated: Patient Education method: Programmer, multimedia, Demonstration, and Handouts Education comprehension: verbalized understanding, returned demonstration, and needs further education  HOME EXERCISE PROGRAM: Access Code: [email or web address] URL: [email or web address] Date: 04/24/2023 Prepared by: Casimer Clear  Exercises - Supine Heel Slide  - 2-3 x daily - 7 x weekly - 1 sets - 10 reps - 5 hold - Seated Long Arc Quad  - 2-3 x daily - 7 x weekly - 1 sets - 5-10 reps - 2 hold - Seated Quad Set  - 2-3 x daily - 7 x weekly - 1 sets - 10 reps - 5 hold - Seated Straight Leg Heel Taps  - 2-3 x daily - 7 x weekly - 1 sets - 10 reps - Standing Hip Abduction with Counter Support  - 1-2 x daily - 7 x weekly - 1 sets - 20 reps - Standing Hip Extension with Counter Support  - 1-2 x daily - 7 x weekly - 1 sets - 20 reps   ASSESSMENT:  CLINICAL IMPRESSION: Able to perform some intervention today that was limited on previous visit.  Continued strengthening to help WB activity improvements.  Continued skilled PT services encouraged at this time.   Tug improvement by 2 seconds range.   OBJECTIVE IMPAIRMENTS: Abnormal gait, decreased balance, decreased mobility,  difficulty walking, decreased ROM, decreased strength, and pain.   ACTIVITY LIMITATIONS: carrying, lifting, squatting, stairs, transfers, bathing, and locomotion level  PARTICIPATION LIMITATIONS: meal prep, cleaning, laundry, driving, shopping, community activity, and yard work  PERSONAL FACTORS: 3+ comorbidities: Anxiety, arthritis, back pain, DM, depression, GERD, HTN. Seen by clinic in past.   are also affecting patient's functional outcome.   REHAB POTENTIAL: Good  CLINICAL DECISION MAKING: Evolving/moderate complexity  EVALUATION COMPLEXITY: Moderate   GOALS: Goals reviewed with patient? Yes  SHORT TERM GOALS: Target date: 05/15/2023  Independent with initial HEP Goal status: on going 05/07/2023  LONG TERM GOALS: Target date: 06/05/2023  Independent with final HEP Goal status: INITIAL  2.  PSFS score improved by 3 points Goal status: INITIAL  3.  Rt knee flexion improved to 100 deg for improved mobility Goal status: INIITAL  4.  5x STS improved to < 15 sec for improved functional strength and mobility Goal status: INITIAL  5.  Improve TUG to <13 sec for improved balance and decreased fall risk Goal status: INITIAL      PLAN:  PT FREQUENCY: 1-2x/week  PT DURATION: 6 weeks  PLANNED INTERVENTIONS: 97164- PT Re-evaluation, 97750- Physical Performance Testing, 97110-Therapeutic exercises, 97530- Therapeutic activity, [id number]- Neuromuscular re-education, 97535- Self Care, 16109- Manual therapy, [id number]- Gait training, [phone]- Aquatic Therapy, [phone]- Electrical stimulation (unattended), 97016- Vasopneumatic device, Patient/Family education, Balance training, Stair training, Taping, Dry Needling, Joint mobilization, Cryotherapy, and Moist heat.  PLAN FOR NEXT SESSION: NWB, WB strengthening, balance improvements.    NEXT MD VISIT: 06/12/23   Bonna Bustard, PT, DPT, OCS, ATC 05/07/23  3:43 PM

## 2023-05-09 ENCOUNTER — Encounter: Admitting: Physical Therapy

## 2023-05-14 ENCOUNTER — Ambulatory Visit: Admitting: Physical Therapy

## 2023-05-14 DIAGNOSIS — M25561 Pain in right knee: Secondary | ICD-10-CM | POA: Diagnosis not present

## 2023-05-14 DIAGNOSIS — R262 Difficulty in walking, not elsewhere classified: Secondary | ICD-10-CM | POA: Diagnosis not present

## 2023-05-14 DIAGNOSIS — R600 Localized edema: Secondary | ICD-10-CM | POA: Diagnosis not present

## 2023-05-14 DIAGNOSIS — G8929 Other chronic pain: Secondary | ICD-10-CM

## 2023-05-14 DIAGNOSIS — M6281 Muscle weakness (generalized): Secondary | ICD-10-CM

## 2023-05-14 DIAGNOSIS — M25661 Stiffness of right knee, not elsewhere classified: Secondary | ICD-10-CM

## 2023-05-14 DIAGNOSIS — R2681 Unsteadiness on feet: Secondary | ICD-10-CM

## 2023-05-14 NOTE — Therapy (Signed)
 OUTPATIENT PHYSICAL THERAPY TREATMENT   Patient Name: Crystal Jackson MRN: [id number] DOB:12-Feb-1945, 78 y.o., female Today's Date: 05/14/2023  END OF SESSION:  PT End of Session - 05/14/23 1348     Visit Number 4    Number of Visits 12    Date for PT Re-Evaluation 06/05/23    Authorization Type [employer]    Progress Note Due on Visit 10    PT Start Time 1348    PT Stop Time 1426    PT Time Calculation (min) 38 min    Activity Tolerance Patient tolerated treatment well    Behavior During Therapy WFL for tasks assessed/performed             Past Medical History:  Diagnosis Date   Anxiety    Arthritis    Asthma    Back pain    Bilateral swelling of feet    Constipation    Depression    Diabetes mellitus without complication (HCC)    Gallbladder problem    GERD (gastroesophageal reflux disease)    History of low potassium    Hypertension    Joint pain    Obesity    Palpitations    Shortness of breath    Sleep apnea    Past Surgical History:  Procedure Laterality Date   ABDOMINAL HYSTERECTOMY     APPENDECTOMY     CATARACT EXTRACTION, BILATERAL     CHOLECYSTECTOMY     KNEE SURGERY Left    arthroscopy   SHOULDER SURGERY Right    TOTAL KNEE ARTHROPLASTY Right 09/14/2022   Procedure: RIGHT TOTAL KNEE ARTHROPLASTY;  Surgeon: Arnie Lao, MD;  Location: [facility];  Service: Orthopedics;  Laterality: Right;   Patient Active Problem List   Diagnosis Date Noted   Status post right knee replacement 09/16/2022   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/14/2022   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   BMI 40.0-44.9, adult (HCC)-current bmi 37.3 03/20/2022   Morbid obesity (HCC) 01/30/2022   BMI 37.0-37.9, adult 01/30/2022   B12 deficiency due to diet 09/27/2021   SOB (shortness of breath) on exertion 09/27/2021   Chronic vertigo 09/27/2021   Other hyperlipidemia 08/28/2021   Vitamin D  deficiency 08/28/2021   Pre-diabetes 06/28/2021    Sepsis (HCC) 05/15/2021   Hypoxia 05/15/2021   Essential hypertension 05/15/2021   DM (diabetes mellitus), type 2 (HCC) 05/15/2021   Hypomagnesemia 05/15/2021   UTI (urinary tract infection) 05/14/2021   Acute exacerbation of chronic low back pain 03/10/2021   OSA on CPAP 09/03/2018   Mild persistent asthma without complication 09/03/2018   Class 3 severe obesity with serious comorbidity and body mass index (BMI) of 40.0 to 44.9 in adult 04/14/2018   Unilateral primary osteoarthritis, left knee 04/14/2018   Asthma in adult, mild intermittent, uncomplicated 02/26/2017   Allergic rhinitis 09/05/2013    PCP: Azalia Leo, MD  REFERRING PROVIDER: Arnie Lao*  REFERRING DIAG: W09.811 (ICD-10-CM) - History of total right knee replacement  Rationale for Evaluation and Treatment: Rehabilitation  THERAPY DIAG:  No diagnosis found.  ONSET DATE: DOS: 09/14/2022; Injury 12/2022   SUBJECTIVE:  SUBJECTIVE STATEMENT: Pt states the knees are doing better. Reports more issues with her shoulder than the knee. Some pain in the left knee.   PERTINENT HISTORY:  Osteoporosis, anxiety, arthritis, back pain, DM, depression, GERD, HTN. Seen by clinic in past.   PAIN:  Are you having pain? 4 to 5 out of 10 in L lateral knee  PRECAUTIONS:  None  RED FLAGS: None   WEIGHT BEARING RESTRICTIONS:  No  FALLS:  Has patient fallen in last 6 months? No  LIVING ENVIRONMENT: Lives with: lives with their spouse Lives in: House/apartment Stairs: Yes: External: 6 steps; can reach both Has following equipment at home: Single point cane and Walker - 2 wheeled  OCCUPATION:  Retired from nursing  PLOF:  Independent and Leisure: gardening; walking, reading  PATIENT GOALS:  Walk steady and improve  balance, negotiate stairs   OBJECTIVE:  Note: Objective measures were completed at Evaluation unless otherwise noted.  PATIENT SURVEYS:  Patient-Specific Activity Scoring Scheme  "0" represents "unable to perform." "10" represents "able to perform at prior level. 0 1 2 3 4 5 6 7 8 9  10 (Date and Score)   Activity Eval  04/24/23    1. Walking 6     2. Stairs 3     3. Gardening 2   Score 3.67    Total score = sum of the activity scores/number of activities Minimum detectable change (90%CI) for average score = 2 points Minimum detectable change (90%CI) for single activity score = 3 points  COGNITIVE STATUS: Within functional limits for tasks assessed   SENSATION: WFL   GAIT: 04/24/23 Comments: decreased step length bil; decreased foot clearance, bil trunk lean in stance  LOWER EXTREMITY ROM:     ROM Right Eval 04/24/23  Knee flexion 92  (sitting)  Knee extension 0   (Blank rows = not tested)   LOWER EXTREMITY MMT:    MMT Right Eval 04/24/23 Left Eval 04/24/23  Hip flexion 3+/5 4/5  Knee flexion 4/5 5/5  Knee extension 4/5 4/5   (Blank rows = not tested)   FUNCTIONAL TESTS:  05/07/2023: TUG independently:   15.59  04/24/23 5 times sit to stand: 20.03 sec without UE support Timed up and go (TUG): 17.62 sec Gait velocity: 2.46 ft/sec                  TREATMENT       DATE: 05/14/2023 Therex: Nustep lvl 5 for 5 mins , LE only  Supine hip flexor/quad stretch with strap 2x30" Supine heel slide 3x10" Supine ITB stretch with strap on L x30" Supine piriformis stretch on L x30"  Neuro Re-ed: (to improve balance, neuromuscular control/recruitment) Supine SLR 2# x10, with ER x10 Supine bridge with red TB around knees 2x10 Tandem stance 2x30"  TherActivity (to improve transfers, stairs)  Sit to stand to sit 18 inch chair red TB around knees x 10 with slow sitting.  Slow march 2# 2x10 3 way hip with wash rag under foot x10 Amb with focus on glute firing  during hip extension and stance phase   TREATMENT       DATE: 05/07/2023 Therex: Nustep 10 mins with lvl 6 for 5 mins and lvl 5 for 5 mins , LE only  Seated LAQ alternating x 10 bilateral  Seated quad set with SLR x 15 bilateral    Neuro Re-ed: (to improve balance, neuromuscular control/recruitment) Tandem stance 1 min x 2 bilateral in // bars with occasional HHA Alternating  heel/toe lift x 15 each way    TherActivity (to improve transfers, stairs)  Leg press 62 lbs x 15 with slow lowering focus (increase weight next visit )  single leg Lt x 10 25 lbs, Rt 31 lbs x 15 Sit to stand to sit 18 inch chair x 10 with slow sitting.     TREATMENT       DATE: 05/02/23 TherEx Nu step L6 LE only X 8 min Gastroc stretch 30 sec X 3 bilat Supine heel slides AAROM 5 sec X 10 Supine SLR on Rt, X 10 reps Seated LAQ on Rt, 2X10 Seated hamstring curls red 2X10 Theractivity Leg press DL 40# 9W11, then Rt leg only 31# 2X10, attempted Lt leg only at 31# and at 25# and was painful so discontinued. Standing hip abductions X 10 bilat Standing hip extensions X 10 bilat  TREATMENT       DATE: 04/24/23 TherEx See HEP - demonstrated with trial reps performed PRN, min cues for comprehension    Self Care Educated on PT POC and clinical findings     PATIENT EDUCATION:  Education details: HEP, POC Person educated: Patient Education method: Programmer, multimedia, Demonstration, and Handouts Education comprehension: verbalized understanding, returned demonstration, and needs further education  HOME EXERCISE PROGRAM: Access Code: [email or web address] URL: [email or web address] Date: 04/24/2023 Prepared by: Casimer Clear  Exercises - Supine Heel Slide  - 2-3 x daily - 7 x weekly - 1 sets - 10 reps - 5 hold - Seated Long Arc Quad  - 2-3 x daily - 7 x weekly - 1 sets - 5-10 reps - 2 hold - Seated Quad Set  - 2-3 x daily - 7 x weekly - 1 sets - 10 reps - 5 hold - Seated Straight Leg Heel Taps  - 2-3 x  daily - 7 x weekly - 1 sets - 10 reps - Standing Hip Abduction with Counter Support  - 1-2 x daily - 7 x weekly - 1 sets - 20 reps - Standing Hip Extension with Counter Support  - 1-2 x daily - 7 x weekly - 1 sets - 20 reps   ASSESSMENT:  CLINICAL IMPRESSION: Continued to progress quad/hamstring strength and eccentric control. Activities focused on improving weight bearing and balance in standing for improved stability.   Able to perform some intervention today that was limited on previous visit.  Continued strengthening to help WB activity improvements.  Continued skilled PT services encouraged at this time.   Tug improvement by 2 seconds range.   OBJECTIVE IMPAIRMENTS: Abnormal gait, decreased balance, decreased mobility, difficulty walking, decreased ROM, decreased strength, and pain.   ACTIVITY LIMITATIONS: carrying, lifting, squatting, stairs, transfers, bathing, and locomotion level  PARTICIPATION LIMITATIONS: meal prep, cleaning, laundry, driving, shopping, community activity, and yard work  PERSONAL FACTORS: 3+ comorbidities: Anxiety, arthritis, back pain, DM, depression, GERD, HTN. Seen by clinic in past.  are also affecting patient's functional outcome.   REHAB POTENTIAL: Good  CLINICAL DECISION MAKING: Evolving/moderate complexity  EVALUATION COMPLEXITY: Moderate   GOALS: Goals reviewed with patient? Yes  SHORT TERM GOALS: Target date: 05/15/2023  Independent with initial HEP Goal status: MET - 05/14/2023; on going 05/07/2023  LONG TERM GOALS: Target date: 06/05/2023  Independent with final HEP Goal status: INITIAL  2.  PSFS score improved by 3 points Goal status: INITIAL  3.  Rt knee flexion improved to 100 deg for improved mobility Goal status: INIITAL  4.  5x STS improved to < 15 sec for improved functional strength  and mobility Goal status: INITIAL  5.  Improve TUG to <13 sec for improved balance and decreased fall risk Goal status:  INITIAL      PLAN:  PT FREQUENCY: 1-2x/week  PT DURATION: 6 weeks  PLANNED INTERVENTIONS: 97164- PT Re-evaluation, 97750- Physical Performance Testing, 97110-Therapeutic exercises, 97530- Therapeutic activity, [id number]- Neuromuscular re-education, 97535- Self Care, 16109- Manual therapy, [id number]- Gait training, [phone]- Aquatic Therapy, [phone]- Electrical stimulation (unattended), 97016- Vasopneumatic device, Patient/Family education, Balance training, Stair training, Taping, Dry Needling, Joint mobilization, Cryotherapy, and Moist heat.  PLAN FOR NEXT SESSION: NWB, WB strengthening, balance improvements.    NEXT MD VISIT: 06/12/23   Lovey Crupi April Ma L Jamyson Jirak, PT 05/14/23  1:50 PM

## 2023-05-16 ENCOUNTER — Ambulatory Visit: Admitting: Physical Therapy

## 2023-05-16 ENCOUNTER — Encounter: Payer: Self-pay | Admitting: Physical Therapy

## 2023-05-16 DIAGNOSIS — M6281 Muscle weakness (generalized): Secondary | ICD-10-CM | POA: Diagnosis not present

## 2023-05-16 DIAGNOSIS — M25661 Stiffness of right knee, not elsewhere classified: Secondary | ICD-10-CM

## 2023-05-16 DIAGNOSIS — M25561 Pain in right knee: Secondary | ICD-10-CM

## 2023-05-16 DIAGNOSIS — G8929 Other chronic pain: Secondary | ICD-10-CM | POA: Diagnosis not present

## 2023-05-16 DIAGNOSIS — R262 Difficulty in walking, not elsewhere classified: Secondary | ICD-10-CM

## 2023-05-16 NOTE — Therapy (Signed)
 OUTPATIENT PHYSICAL THERAPY TREATMENT   Patient Name: Crystal Jackson MRN: [id number] DOB:Sep 27, 1945, 78 y.o., female Today's Date: 05/16/2023  END OF SESSION:  PT End of Session - 05/16/23 1349     Visit Number 5    Number of Visits 12    Date for PT Re-Evaluation 06/05/23    Authorization Type [employer]    Progress Note Due on Visit 10    PT Start Time 1345    PT Stop Time 1424    PT Time Calculation (min) 39 min    Activity Tolerance Patient tolerated treatment well    Behavior During Therapy WFL for tasks assessed/performed              Past Medical History:  Diagnosis Date   Anxiety    Arthritis    Asthma    Back pain    Bilateral swelling of feet    Constipation    Depression    Diabetes mellitus without complication (HCC)    Gallbladder problem    GERD (gastroesophageal reflux disease)    History of low potassium    Hypertension    Joint pain    Obesity    Palpitations    Shortness of breath    Sleep apnea    Past Surgical History:  Procedure Laterality Date   ABDOMINAL HYSTERECTOMY     APPENDECTOMY     CATARACT EXTRACTION, BILATERAL     CHOLECYSTECTOMY     KNEE SURGERY Left    arthroscopy   SHOULDER SURGERY Right    TOTAL KNEE ARTHROPLASTY Right 09/14/2022   Procedure: RIGHT TOTAL KNEE ARTHROPLASTY;  Surgeon: Arnie Lao, MD;  Location: [facility];  Service: Orthopedics;  Laterality: Right;   Patient Active Problem List   Diagnosis Date Noted   Status post right knee replacement 09/16/2022   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/14/2022   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   BMI 40.0-44.9, adult (HCC)-current bmi 37.3 03/20/2022   Morbid obesity (HCC) 01/30/2022   BMI 37.0-37.9, adult 01/30/2022   B12 deficiency due to diet 09/27/2021   SOB (shortness of breath) on exertion 09/27/2021   Chronic vertigo 09/27/2021   Other hyperlipidemia 08/28/2021   Vitamin D  deficiency 08/28/2021   Pre-diabetes 06/28/2021    Sepsis (HCC) 05/15/2021   Hypoxia 05/15/2021   Essential hypertension 05/15/2021   DM (diabetes mellitus), type 2 (HCC) 05/15/2021   Hypomagnesemia 05/15/2021   UTI (urinary tract infection) 05/14/2021   Acute exacerbation of chronic low back pain 03/10/2021   OSA on CPAP 09/03/2018   Mild persistent asthma without complication 09/03/2018   Class 3 severe obesity with serious comorbidity and body mass index (BMI) of 40.0 to 44.9 in adult 04/14/2018   Unilateral primary osteoarthritis, left knee 04/14/2018   Asthma in adult, mild intermittent, uncomplicated 02/26/2017   Allergic rhinitis 09/05/2013    PCP: Azalia Leo, MD  REFERRING PROVIDER: Arnie Lao*  REFERRING DIAG: [phone] (ICD-10-CM) - History of total right knee replacement  Rationale for Evaluation and Treatment: Rehabilitation  THERAPY DIAG:  Chronic pain of right knee  Muscle weakness (generalized)  Stiffness of right knee, not elsewhere classified  Difficulty in walking, not elsewhere classified  ONSET DATE: DOS: 09/14/2022; Injury 12/2022   SUBJECTIVE:  SUBJECTIVE STATEMENT: Lt shoulder is still pretty painful; and her Rt knee feels weak today.  PERTINENT HISTORY:  Osteoporosis, anxiety, arthritis, back pain, DM, depression, GERD, HTN. Seen by clinic in past.   PAIN:  Are you having pain? 4 to 5 out of 10 in L lateral knee  PRECAUTIONS:  None  RED FLAGS: None   WEIGHT BEARING RESTRICTIONS:  No  FALLS:  Has patient fallen in last 6 months? No  LIVING ENVIRONMENT: Lives with: lives with their spouse Lives in: House/apartment Stairs: Yes: External: 6 steps; can reach both Has following equipment at home: Single point cane and Walker - 2 wheeled  OCCUPATION:  Retired from nursing  PLOF:   Independent and Leisure: gardening; walking, reading  PATIENT GOALS:  Walk steady and improve balance, negotiate stairs   OBJECTIVE:  Note: Objective measures were completed at Evaluation unless otherwise noted.  PATIENT SURVEYS:  Patient-Specific Activity Scoring Scheme  "0" represents "unable to perform." "10" represents "able to perform at prior level. 0 1 2 3 4 5 6 7 8 9  10 (Date and Score)   Activity Eval  04/24/23    1. Walking 6     2. Stairs 3     3. Gardening 2   Score 3.67    Total score = sum of the activity scores/number of activities Minimum detectable change (90%CI) for average score = 2 points Minimum detectable change (90%CI) for single activity score = 3 points  COGNITIVE STATUS: Within functional limits for tasks assessed   SENSATION: WFL   GAIT: 04/24/23 Comments: decreased step length bil; decreased foot clearance, bil trunk lean in stance  LOWER EXTREMITY ROM:     ROM Right Eval 04/24/23  Knee flexion 92  (sitting)  Knee extension 0   (Blank rows = not tested)   LOWER EXTREMITY MMT:    MMT Right Eval 04/24/23 Left Eval 04/24/23  Hip flexion 3+/5 4/5  Knee flexion 4/5 5/5  Knee extension 4/5 4/5   (Blank rows = not tested)   FUNCTIONAL TESTS:  05/07/2023: TUG independently:   15.59  04/24/23 5 times sit to stand: 20.03 sec without UE support Timed up and go (TUG): 17.62 sec Gait velocity: 2.46 ft/sec                  TREATMENT 05/16/23 TherEx NuStep L6 x 6 min; LEs only Seated hamstring stretch  3x30 sec bil Seated piriformis stretch 2x30 sec bil  TherAct Leg press bil 75# 3x10; then single leg 37# 3x10 on Rt Calf raises 3x10; light UE support  Forward step ups with 1 UE support on 6" step 3x10 leading with Rt Standing hip abduction 2x10 with L3 band bil Standing hip extension 2x10 with L3 band bil Sit to/from stand x 10 reps without UE support   05/14/2023 Therex: Nustep lvl 5 for 5 mins , LE only  Supine hip  flexor/quad stretch with strap 2x30" Supine heel slide 3x10" Supine ITB stretch with strap on L x30" Supine piriformis stretch on L x30"  Neuro Re-ed: (to improve balance, neuromuscular control/recruitment) Supine SLR 2# x10, with ER x10 Supine bridge with red TB around knees 2x10 Tandem stance 2x30"  TherActivity (to improve transfers, stairs)  Sit to stand to sit 18 inch chair red TB around knees x 10 with slow sitting.  Slow march 2# 2x10 3 way hip with wash rag under foot x10 Amb with focus on glute firing during hip extension and stance phase   05/07/2023  Therex: Nustep 10 mins with lvl 6 for 5 mins and lvl 5 for 5 mins , LE only  Seated LAQ alternating x 10 bilateral  Seated quad set with SLR x 15 bilateral    Neuro Re-ed: (to improve balance, neuromuscular control/recruitment) Tandem stance 1 min x 2 bilateral in // bars with occasional HHA Alternating heel/toe lift x 15 each way    TherActivity (to improve transfers, stairs)  Leg press 62 lbs x 15 with slow lowering focus (increase weight next visit )  single leg Lt x 10 25 lbs, Rt 31 lbs x 15 Sit to stand to sit 18 inch chair x 10 with slow sitting.     PATIENT EDUCATION:  Education details: HEP, POC Person educated: Patient Education method: Explanation, Demonstration, and Handouts Education comprehension: verbalized understanding, returned demonstration, and needs further education  HOME EXERCISE PROGRAM: Access Code: [email or web address] URL: [email or web address] Date: 04/24/2023 Prepared by: Casimer Clear  Exercises - Supine Heel Slide  - 2-3 x daily - 7 x weekly - 1 sets - 10 reps - 5 hold - Seated Long Arc Quad  - 2-3 x daily - 7 x weekly - 1 sets - 5-10 reps - 2 hold - Seated Quad Set  - 2-3 x daily - 7 x weekly - 1 sets - 10 reps - 5 hold - Seated Straight Leg Heel Taps  - 2-3 x daily - 7 x weekly - 1 sets - 10 reps - Standing Hip Abduction with Counter Support  - 1-2 x daily - 7 x weekly - 1  sets - 20 reps - Standing Hip Extension with Counter Support  - 1-2 x daily - 7 x weekly - 1 sets - 20 reps   ASSESSMENT:  CLINICAL IMPRESSION: Continued to improve functional strength today with good tolerance to activity.  Still has some continued pain in Lt knee affecting mobility but overall Rt knee is improving.   OBJECTIVE IMPAIRMENTS: Abnormal gait, decreased balance, decreased mobility, difficulty walking, decreased ROM, decreased strength, and pain.   ACTIVITY LIMITATIONS: carrying, lifting, squatting, stairs, transfers, bathing, and locomotion level  PARTICIPATION LIMITATIONS: meal prep, cleaning, laundry, driving, shopping, community activity, and yard work  PERSONAL FACTORS: 3+ comorbidities: Anxiety, arthritis, back pain, DM, depression, GERD, HTN. Seen by clinic in past.  are also affecting patient's functional outcome.   REHAB POTENTIAL: Good  CLINICAL DECISION MAKING: Evolving/moderate complexity  EVALUATION COMPLEXITY: Moderate   GOALS: Goals reviewed with patient? Yes  SHORT TERM GOALS: Target date: 05/15/2023  Independent with initial HEP Goal status: MET - 05/14/2023; on going 05/07/2023  LONG TERM GOALS: Target date: 06/05/2023  Independent with final HEP Goal status: INITIAL  2.  PSFS score improved by 3 points Goal status: INITIAL  3.  Rt knee flexion improved to 100 deg for improved mobility Goal status: INIITAL  4.  5x STS improved to < 15 sec for improved functional strength and mobility Goal status: INITIAL  5.  Improve TUG to <13 sec for improved balance and decreased fall risk Goal status: INITIAL      PLAN:  PT FREQUENCY: 1-2x/week  PT DURATION: 6 weeks  PLANNED INTERVENTIONS: 97164- PT Re-evaluation, 97750- Physical Performance Testing, 97110-Therapeutic exercises, 97530- Therapeutic activity, [id number]- Neuromuscular re-education, 97535- Self Care, [id number]- Manual therapy, [id number]- Gait training, [phone]- Aquatic Therapy, [phone]- Electrical  stimulation (unattended), 97016- Vasopneumatic device, Patient/Family education, Balance training, Stair training, Taping, Dry Needling, Joint mobilization, Cryotherapy, and Moist heat.  PLAN FOR NEXT SESSION: continue WB strengthening, balance improvements.  NEXT MD VISIT: 06/12/23   Marley Simmers, PT, DPT 05/16/23 2:26 PM

## 2023-05-21 ENCOUNTER — Ambulatory Visit: Payer: Self-pay | Admitting: Physical Therapy

## 2023-05-21 ENCOUNTER — Encounter: Payer: Self-pay | Admitting: Physical Therapy

## 2023-05-21 DIAGNOSIS — R262 Difficulty in walking, not elsewhere classified: Secondary | ICD-10-CM

## 2023-05-21 DIAGNOSIS — M25661 Stiffness of right knee, not elsewhere classified: Secondary | ICD-10-CM

## 2023-05-21 DIAGNOSIS — M6281 Muscle weakness (generalized): Secondary | ICD-10-CM | POA: Diagnosis not present

## 2023-05-21 DIAGNOSIS — M25561 Pain in right knee: Secondary | ICD-10-CM

## 2023-05-21 DIAGNOSIS — G8929 Other chronic pain: Secondary | ICD-10-CM

## 2023-05-21 NOTE — Therapy (Signed)
 OUTPATIENT PHYSICAL THERAPY TREATMENT DISCHARGE SUMMARY   Patient Name: Crystal Jackson MRN: [id number] DOB:Mar 11, 1945, 78 y.o., female Today's Date: 05/21/2023  END OF SESSION:  PT End of Session - 05/21/23 1007     Visit Number 6    Number of Visits 12    Date for PT Re-Evaluation 06/05/23    Authorization Type [employer]    Progress Note Due on Visit 10    PT Start Time 1008    PT Stop Time 1048    PT Time Calculation (min) 40 min    Activity Tolerance Patient tolerated treatment well    Behavior During Therapy WFL for tasks assessed/performed               Past Medical History:  Diagnosis Date   Anxiety    Arthritis    Asthma    Back pain    Bilateral swelling of feet    Constipation    Depression    Diabetes mellitus without complication (HCC)    Gallbladder problem    GERD (gastroesophageal reflux disease)    History of low potassium    Hypertension    Joint pain    Obesity    Palpitations    Shortness of breath    Sleep apnea    Past Surgical History:  Procedure Laterality Date   ABDOMINAL HYSTERECTOMY     APPENDECTOMY     CATARACT EXTRACTION, BILATERAL     CHOLECYSTECTOMY     KNEE SURGERY Left    arthroscopy   SHOULDER SURGERY Right    TOTAL KNEE ARTHROPLASTY Right 09/14/2022   Procedure: RIGHT TOTAL KNEE ARTHROPLASTY;  Surgeon: Arnie Lao, MD;  Location: [facility];  Service: Orthopedics;  Laterality: Right;   Patient Active Problem List   Diagnosis Date Noted   Status post right knee replacement 09/16/2022   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/14/2022   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   BMI 40.0-44.9, adult (HCC)-current bmi 37.3 03/20/2022   Morbid obesity (HCC) 01/30/2022   BMI 37.0-37.9, adult 01/30/2022   B12 deficiency due to diet 09/27/2021   SOB (shortness of breath) on exertion 09/27/2021   Chronic vertigo 09/27/2021   Other hyperlipidemia 08/28/2021   Vitamin D  deficiency 08/28/2021    Pre-diabetes 06/28/2021   Sepsis (HCC) 05/15/2021   Hypoxia 05/15/2021   Essential hypertension 05/15/2021   DM (diabetes mellitus), type 2 (HCC) 05/15/2021   Hypomagnesemia 05/15/2021   UTI (urinary tract infection) 05/14/2021   Acute exacerbation of chronic low back pain 03/10/2021   OSA on CPAP 09/03/2018   Mild persistent asthma without complication 09/03/2018   Class 3 severe obesity with serious comorbidity and body mass index (BMI) of 40.0 to 44.9 in adult 04/14/2018   Unilateral primary osteoarthritis, left knee 04/14/2018   Asthma in adult, mild intermittent, uncomplicated 02/26/2017   Allergic rhinitis 09/05/2013    PCP: Azalia Leo, MD  REFERRING PROVIDER: Arnie Lao*  REFERRING DIAG: [phone] (ICD-10-CM) - History of total right knee replacement  Rationale for Evaluation and Treatment: Rehabilitation  THERAPY DIAG:  Chronic pain of right knee  Muscle weakness (generalized)  Stiffness of right knee, not elsewhere classified  Difficulty in walking, not elsewhere classified  ONSET DATE: DOS: 09/14/2022; Injury 12/2022   SUBJECTIVE:  SUBJECTIVE STATEMENT: Received a denial letter from her insurance Doing well with RLE overall  PERTINENT HISTORY:  Osteoporosis, anxiety, arthritis, back pain, DM, depression, GERD, HTN. Seen by clinic in past.   PAIN:  Are you having pain? 4 to 5 out of 10 in L lateral knee  PRECAUTIONS:  None  RED FLAGS: None   WEIGHT BEARING RESTRICTIONS:  No  FALLS:  Has patient fallen in last 6 months? No  LIVING ENVIRONMENT: Lives with: lives with their spouse Lives in: House/apartment Stairs: Yes: External: 6 steps; can reach both Has following equipment at home: Single point cane and Walker - 2 wheeled  OCCUPATION:  Retired  from nursing  PLOF:  Independent and Leisure: gardening; walking, reading  PATIENT GOALS:  Walk steady and improve balance, negotiate stairs   OBJECTIVE:  Note: Objective measures were completed at Evaluation unless otherwise noted.  PATIENT SURVEYS:  Patient-Specific Activity Scoring Scheme  "0" represents "unable to perform." "10" represents "able to perform at prior level. 0 1 2 3 4 5 6 7 8 9  10 (Date and Score)   Activity Eval  04/24/23    1. Walking 6     2. Stairs 3     3. Gardening 2   Score 3.67    Total score = sum of the activity scores/number of activities Minimum detectable change (90%CI) for average score = 2 points Minimum detectable change (90%CI) for single activity score = 3 points  COGNITIVE STATUS: Within functional limits for tasks assessed   SENSATION: WFL   GAIT: 04/24/23 Comments: decreased step length bil; decreased foot clearance, bil trunk lean in stance  LOWER EXTREMITY ROM:     ROM Right Eval 04/24/23  Knee flexion 92  (sitting)  Knee extension 0   (Blank rows = not tested)   LOWER EXTREMITY MMT:    MMT Right Eval 04/24/23 Left Eval 04/24/23  Hip flexion 3+/5 4/5  Knee flexion 4/5 5/5  Knee extension 4/5 4/5   (Blank rows = not tested)   FUNCTIONAL TESTS:  05/07/2023: TUG independently:   15.59  04/24/23 5 times sit to stand: 20.03 sec without UE support Timed up and go (TUG): 17.62 sec Gait velocity: 2.46 ft/sec                  TREATMENT 05/21/23 TherEx NuStep L6 x 8 min; LEs only  TherAct Leg press bil 75# 3x10; then single leg 37# 3x10 on Rt Sit to/from stand 2x10 with 12# KB RDL 12# KB 2x10  Neuro Re Ed Head turns horizontal and vertical on foam x10 each direction EC and feet together on foam 5x10 sec with tactile feedback for sway Sidestepping on foam x 5 laps; intermittent UE support Tandem walking fwd/bwd x 5 laps on foam; UE support needed SLS on RLE with min A: taps to 3 blaze pods x 3 cycles (30  sec on/30 sec rest)   05/16/23 TherEx NuStep L6 x 6 min; LEs only Seated hamstring stretch  3x30 sec bil Seated piriformis stretch 2x30 sec bil  TherAct Leg press bil 75# 3x10; then single leg 37# 3x10 on Rt Calf raises 3x10; light UE support  Forward step ups with 1 UE support on 6" step 3x10 leading with Rt Standing hip abduction 2x10 with L3 band bil Standing hip extension 2x10 with L3 band bil Sit to/from stand x 10 reps without UE support   05/14/2023 Therex: Nustep lvl 5 for 5 mins , LE only  Supine hip flexor/quad  stretch with strap 2x30" Supine heel slide 3x10" Supine ITB stretch with strap on L x30" Supine piriformis stretch on L x30"  Neuro Re-ed: (to improve balance, neuromuscular control/recruitment) Supine SLR 2# x10, with ER x10 Supine bridge with red TB around knees 2x10 Tandem stance 2x30"  TherActivity (to improve transfers, stairs)  Sit to stand to sit 18 inch chair red TB around knees x 10 with slow sitting.  Slow march 2# 2x10 3 way hip with wash rag under foot x10 Amb with focus on glute firing during hip extension and stance phase   05/07/2023 Therex: Nustep 10 mins with lvl 6 for 5 mins and lvl 5 for 5 mins , LE only  Seated LAQ alternating x 10 bilateral  Seated quad set with SLR x 15 bilateral    Neuro Re-ed: (to improve balance, neuromuscular control/recruitment) Tandem stance 1 min x 2 bilateral in // bars with occasional HHA Alternating heel/toe lift x 15 each way    TherActivity (to improve transfers, stairs)  Leg press 62 lbs x 15 with slow lowering focus (increase weight next visit )  single leg Lt x 10 25 lbs, Rt 31 lbs x 15 Sit to stand to sit 18 inch chair x 10 with slow sitting.     PATIENT EDUCATION:  Education details: HEP, POC Person educated: Patient Education method: Explanation, Demonstration, and Handouts Education comprehension: verbalized understanding, returned demonstration, and needs further education  HOME  EXERCISE PROGRAM: Access Code: [email or web address] URL: [email or web address] Date: 04/24/2023 Prepared by: Casimer Clear  Exercises - Supine Heel Slide  - 2-3 x daily - 7 x weekly - 1 sets - 10 reps - 5 hold - Seated Long Arc Quad  - 2-3 x daily - 7 x weekly - 1 sets - 5-10 reps - 2 hold - Seated Quad Set  - 2-3 x daily - 7 x weekly - 1 sets - 10 reps - 5 hold - Seated Straight Leg Heel Taps  - 2-3 x daily - 7 x weekly - 1 sets - 10 reps - Standing Hip Abduction with Counter Support  - 1-2 x daily - 7 x weekly - 1 sets - 20 reps - Standing Hip Extension with Counter Support  - 1-2 x daily - 7 x weekly - 1 sets - 20 reps   ASSESSMENT:  CLINICAL IMPRESSION: Pt tolerated session well today with continued focus on strengthening and balance.  Overall progressing well and does still have so high level balance deficits. Continue skilled PT.   OBJECTIVE IMPAIRMENTS: Abnormal gait, decreased balance, decreased mobility, difficulty walking, decreased ROM, decreased strength, and pain.   ACTIVITY LIMITATIONS: carrying, lifting, squatting, stairs, transfers, bathing, and locomotion level  PARTICIPATION LIMITATIONS: meal prep, cleaning, laundry, driving, shopping, community activity, and yard work  PERSONAL FACTORS: 3+ comorbidities: Anxiety, arthritis, back pain, DM, depression, GERD, HTN. Seen by clinic in past.  are also affecting patient's functional outcome.   REHAB POTENTIAL: Good  CLINICAL DECISION MAKING: Evolving/moderate complexity  EVALUATION COMPLEXITY: Moderate   GOALS: Goals reviewed with patient? Yes  SHORT TERM GOALS: Target date: 05/15/2023  Independent with initial HEP Goal status: MET - 05/14/2023; on going 05/07/2023  LONG TERM GOALS: Target date: 06/05/2023  Independent with final HEP Goal status: INITIAL  2.  PSFS score improved by 3 points Goal status: INITIAL  3.  Rt knee flexion improved to 100 deg for improved mobility Goal status: INIITAL  4.   5x STS improved to < 15 sec for improved functional  strength and mobility Goal status: INITIAL  5.  Improve TUG to <13 sec for improved balance and decreased fall risk Goal status: INITIAL      PLAN:  PT FREQUENCY: 1-2x/week  PT DURATION: 6 weeks  PLANNED INTERVENTIONS: 97164- PT Re-evaluation, 97750- Physical Performance Testing, 97110-Therapeutic exercises, 97530- Therapeutic activity, [id number]- Neuromuscular re-education, 97535- Self Care, 43329- Manual therapy, [id number]- Gait training, [phone]- Aquatic Therapy, [phone]- Electrical stimulation (unattended), 97016- Vasopneumatic device, Patient/Family education, Balance training, Stair training, Taping, Dry Needling, Joint mobilization, Cryotherapy, and Moist heat.  PLAN FOR NEXT SESSION: balance and functional strengthening focus   NEXT MD VISIT: 06/12/23   Marley Simmers, PT, DPT 05/21/23 10:51 AM       PHYSICAL THERAPY DISCHARGE SUMMARY  Visits from Start of Care: 6  Current functional level related to goals / functional outcomes: See above   Remaining deficits: See above   Education / Equipment: See above   Patient agrees to discharge. Patient goals were not met. Patient is being discharged due to being pleased with the current functional level.  Pt called and requested d/c at this time.  Marley Simmers, PT, DPT 05/30/23 12:01 PM  [facility] [address] [place], [place], [address] Phone: [phone]   Fax:  [phone]

## 2023-05-22 ENCOUNTER — Ambulatory Visit: Payer: Medicare Other | Admitting: Orthopaedic Surgery

## 2023-05-24 DIAGNOSIS — G4733 Obstructive sleep apnea (adult) (pediatric): Secondary | ICD-10-CM | POA: Diagnosis not present

## 2023-06-06 ENCOUNTER — Ambulatory Visit: Admitting: Cardiovascular Disease

## 2023-06-06 ENCOUNTER — Encounter: Admitting: Physical Therapy

## 2023-06-06 DIAGNOSIS — Z1389 Encounter for screening for other disorder: Secondary | ICD-10-CM | POA: Diagnosis not present

## 2023-06-06 DIAGNOSIS — N1831 Chronic kidney disease, stage 3a: Secondary | ICD-10-CM | POA: Diagnosis not present

## 2023-06-06 DIAGNOSIS — R7303 Prediabetes: Secondary | ICD-10-CM | POA: Diagnosis not present

## 2023-06-06 DIAGNOSIS — I129 Hypertensive chronic kidney disease with stage 1 through stage 4 chronic kidney disease, or unspecified chronic kidney disease: Secondary | ICD-10-CM | POA: Diagnosis not present

## 2023-06-06 DIAGNOSIS — Z79899 Other long term (current) drug therapy: Secondary | ICD-10-CM | POA: Diagnosis not present

## 2023-06-10 ENCOUNTER — Encounter: Admitting: Rehabilitative and Restorative Service Providers"

## 2023-06-12 ENCOUNTER — Encounter: Payer: Self-pay | Admitting: Orthopaedic Surgery

## 2023-06-12 ENCOUNTER — Ambulatory Visit: Admitting: Orthopaedic Surgery

## 2023-06-12 DIAGNOSIS — Z96651 Presence of right artificial knee joint: Secondary | ICD-10-CM

## 2023-06-12 NOTE — Progress Notes (Signed)
 The patient is now 8 months status post a right total knee replacement.  We have had her going through extensive physical therapy and extended this due to some instability symptoms of her knee.  She says she is doing much better overall and getting better she states.  She is 78 years old.  On my exam today her extension is full and I can flex her to just past 100 degrees.  The knee feels ligamentously stable to me.  The patella seems to be tracking well.  She will continue her quad strengthening exercises.  We did x-ray her knee back in April and her knee was looking good from a alignment standpoint and looking at the components.  She will continue to work on quad strengthening and her mobility.  Will see her back in 6 months which will be over a year since surgery.  Will have a final AP and lateral of her right knee at that visit.  If there are issues before then she knows to let us  know.

## 2023-06-13 DIAGNOSIS — F418 Other specified anxiety disorders: Secondary | ICD-10-CM | POA: Diagnosis not present

## 2023-06-13 DIAGNOSIS — G4700 Insomnia, unspecified: Secondary | ICD-10-CM | POA: Diagnosis not present

## 2023-06-13 DIAGNOSIS — G4733 Obstructive sleep apnea (adult) (pediatric): Secondary | ICD-10-CM | POA: Diagnosis not present

## 2023-06-13 DIAGNOSIS — I129 Hypertensive chronic kidney disease with stage 1 through stage 4 chronic kidney disease, or unspecified chronic kidney disease: Secondary | ICD-10-CM | POA: Diagnosis not present

## 2023-06-13 DIAGNOSIS — Z0000 Encounter for general adult medical examination without abnormal findings: Secondary | ICD-10-CM | POA: Diagnosis not present

## 2023-06-13 DIAGNOSIS — Z1331 Encounter for screening for depression: Secondary | ICD-10-CM | POA: Diagnosis not present

## 2023-06-13 DIAGNOSIS — K219 Gastro-esophageal reflux disease without esophagitis: Secondary | ICD-10-CM | POA: Diagnosis not present

## 2023-06-13 DIAGNOSIS — Z1339 Encounter for screening examination for other mental health and behavioral disorders: Secondary | ICD-10-CM | POA: Diagnosis not present

## 2023-06-13 DIAGNOSIS — R7303 Prediabetes: Secondary | ICD-10-CM | POA: Diagnosis not present

## 2023-06-13 DIAGNOSIS — N1831 Chronic kidney disease, stage 3a: Secondary | ICD-10-CM | POA: Diagnosis not present

## 2023-06-13 DIAGNOSIS — F331 Major depressive disorder, recurrent, moderate: Secondary | ICD-10-CM | POA: Diagnosis not present

## 2023-06-13 DIAGNOSIS — G609 Hereditary and idiopathic neuropathy, unspecified: Secondary | ICD-10-CM | POA: Diagnosis not present

## 2023-06-13 DIAGNOSIS — Z6836 Body mass index (BMI) 36.0-36.9, adult: Secondary | ICD-10-CM | POA: Diagnosis not present

## 2023-06-13 DIAGNOSIS — J4530 Mild persistent asthma, uncomplicated: Secondary | ICD-10-CM | POA: Diagnosis not present

## 2023-06-17 ENCOUNTER — Encounter: Payer: Self-pay | Admitting: Cardiovascular Disease

## 2023-06-17 NOTE — Progress Notes (Unsigned)
 Cardiology Office Note:    Date:  06/19/2023   ID:  Crystal Jackson, DOB [birth year], MRN [id number]  PCP:  Azalia Leo, MD   [facility] Providers Cardiologist:  Ahmad Alert, MD     Referring MD: Azalia Leo, MD   Chief Complaint  Patient presents with   orthostatic hypotension    History of Present Illness:    Seen with husband, Lynann Demetrius is a 78 y.o. female with a hx of near syncope    Has multiple episodes of near syncope Typically when she is sitting and then stands up, starte walking  Vision gets blurry ,   Seems to be worsening over the past several months  Was on Doxyzosin - was recently stopped  She was on chlorthalidone  but became hyponatremic. Started Spironolactone   But this did not change any of her symptoms.  Does not get any regular exercise  Had Covid earlier this year ,  she was having these symptoms prior to covid.   Has cut back on salt   Does not exercise ,     Feb. 19, 2024 [name] is seen for follow up of her episodes of near syncope / orthostatic hypotension Feeling better, Has been walking some  Not nearly has dizzy as she was   Wt is 222 lbs  Acknowledges that she needs to work on more exercise, weight loss    June 17. 2025 [name] is seen for follow up of her orthostatic hypotension Wt is  221.8 lbs  No syncope or orthostatic hypotension issue  Not getting any exercise  Had a TKA last year and has had some set backs   Still eating some salt        Past Medical History:  Diagnosis Date   Anxiety    Arthritis    Asthma    Back pain    Bilateral swelling of feet    Constipation    Depression    Diabetes mellitus without complication (HCC)    Gallbladder problem    GERD (gastroesophageal reflux disease)    History of low potassium    Hypertension    Joint pain    Obesity    Palpitations    Shortness of breath    Sleep apnea     Past Surgical History:  Procedure Laterality Date    ABDOMINAL HYSTERECTOMY     APPENDECTOMY     CATARACT EXTRACTION, BILATERAL     CHOLECYSTECTOMY     KNEE SURGERY Left    arthroscopy   SHOULDER SURGERY Right    TOTAL KNEE ARTHROPLASTY Right 09/14/2022   Procedure: RIGHT TOTAL KNEE ARTHROPLASTY;  Surgeon: Arnie Lao, MD;  Location: [facility];  Service: Orthopedics;  Laterality: Right;    Current Medications: Current Meds  Medication Sig   albuterol  (VENTOLIN  HFA) 108 (90 Base) MCG/ACT inhaler Inhale 1-2 puffs into the lungs every 6 (six) hours as needed for wheezing or shortness of breath.   b complex vitamins capsule Take 1 capsule by mouth daily.   Calcium  Carbonate-Vitamin D  600-400 MG-UNIT tablet 1 po qd   cetirizine (ZYRTEC) 10 MG tablet Take 10 mg by mouth daily.   clotrimazole-betamethasone (LOTRISONE) cream 1 application. daily as needed (eczema).   diclofenac  Sodium (VOLTAREN ) 1 % GEL Apply 2 g topically daily as needed (pain).   DULoxetine  (CYMBALTA ) 30 MG capsule Take 30 mg by mouth in the morning, at noon, and at bedtime.   Fluticasone  Furoate (ARNUITY ELLIPTA ) 200  MCG/ACT AEPB Inhale 1 puff into the lungs daily.   gabapentin  (NEURONTIN ) 300 MG capsule Take 300 mg by mouth 2 (two) times daily.   metoprolol  tartrate (LOPRESSOR ) 25 MG tablet Take 50 mg by mouth 2 (two) times daily.   Multiple Vitamin (MULTIVITAMIN) tablet Take 1 tablet by mouth daily.    NON FORMULARY Pt uses a cpap nightly   Omega-3 Fatty Acids (FISH OIL) 500 MG CAPS Take 500 mg by mouth daily.   pantoprazole  (PROTONIX ) 40 MG tablet Take 40 mg by mouth 2 (two) times daily.   Polyethylene Glycol 400 (BLINK TEARS OP) Place 1 drop into both eyes as needed (dry eyes).   spironolactone  (ALDACTONE ) 25 MG tablet Take 12.5 mg by mouth daily.   traZODone  (DESYREL ) 100 MG tablet Take 100 mg by mouth at bedtime.   triamcinolone  cream (KENALOG ) 0.1 % Apply 1 application. topically daily as needed (itching).   Xylitol 500 MG DISK Use as directed 1 tablet in the  mouth or throat as needed (as needed for dry mouth).     Allergies:   Avapro [irbesartan], Dextromethorphan, Guaifenesin & derivatives, Valtrex [valacyclovir], Citalopram, Codeine, Duloxetine , Penicillin g, and Vilazodone   Social History   Socioeconomic History   Marital status: Married    Spouse name: Not on file   Number of children: Not on file   Years of education: Not on file   Highest education level: Not on file  Occupational History   Not on file  Tobacco Use   Smoking status: Never    Passive exposure: Never   Smokeless tobacco: Never  Vaping Use   Vaping status: Never Used  Substance and Sexual Activity   Alcohol use: No    Alcohol/week: 0.0 standard drinks of alcohol   Drug use: No   Sexual activity: Yes  Other Topics Concern   Not on file  Social History Narrative   Not on file   Social Drivers of Health   Financial Resource Strain: Not on file  Food Insecurity: No Food Insecurity (09/14/2022)   Hunger Vital Sign    Worried About Running Out of Food in the Last Year: Never true    Ran Out of Food in the Last Year: Never true  Transportation Needs: No Transportation Needs (09/14/2022)   PRAPARE - Administrator, Civil Service (Medical): No    Lack of Transportation (Non-Medical): No  Physical Activity: Not on file  Stress: Not on file  Social Connections: Not on file     Family History: The patient's family history includes Anxiety disorder in her mother; Diabetes in her father and mother; Heart disease in her mother; Hypertension in her father; Obesity in her father and mother.  ROS:   Please see the history of present illness.     All other systems reviewed and are negative.  EKGs/Labs/Other Studies Reviewed:    The following studies were reviewed today:   EKG:          Recent Labs: 09/05/2022: ALT 20 09/15/2022: BUN 16; Creatinine, Ser 0.84; Hemoglobin 11.8; Platelets 185; Potassium 4.3; Sodium 133  Recent Lipid Panel     Component Value Date/Time   CHOL 149 09/27/2021 1141   TRIG 80 09/27/2021 1141   HDL 60 09/27/2021 1141   CHOLHDL 2.5 09/27/2021 1141   LDLCALC 74 09/27/2021 1141     Risk Assessment/Calculations:       Physical Exam:     Physical Exam: Blood pressure 132/78, pulse 75, height 5'  4 (1.626 m), weight 221 lb 12.8 oz (100.6 kg), SpO2 98%.       GEN:  moderately obese , elderly female  in no acute distress HEENT: Normal NECK: No JVD; No carotid bruits LYMPHATICS: No lymphadenopathy CARDIAC: RRR , no murmurs, rubs, gallops RESPIRATORY:  Clear to auscultation without rales, wheezing or rhonchi  ABDOMEN: Soft, non-tender, non-distended MUSCULOSKELETAL:  1 + right lower leg edema No deformity  SKIN: Warm and dry NEUROLOGIC:  Alert and oriented x 3    ASSESSMENT:    1. Essential hypertension   2. Orthostatic hypotension      PLAN:       Orthostatic hypotension:    No further episodes . Encouraged her to exercise Watch her salt   Will see her as needed.              Medication Adjustments/Labs and Tests Ordered: Current medicines are reviewed at length with the patient today.  Concerns regarding medicines are outlined above.  No orders of the defined types were placed in this encounter.  No orders of the defined types were placed in this encounter.    Patient Instructions  Follow-Up: At [facility], you and your health needs are our priority.  As part of our continuing mission to provide you with exceptional heart care, our providers are all part of one team.  This team includes your primary Cardiologist (physician) and Advanced Practice Providers or APPs (Physician Assistants and Nurse Practitioners) who all work together to provide you with the care you need, when you need it.  Your next appointment:   As Needed  Provider:   Ahmad Alert, MD        Signed, Ahmad Alert, MD  06/19/2023 8:38 AM    [facility]

## 2023-06-18 ENCOUNTER — Ambulatory Visit: Attending: Cardiovascular Disease | Admitting: Cardiovascular Disease

## 2023-06-18 ENCOUNTER — Encounter: Payer: Self-pay | Admitting: Cardiovascular Disease

## 2023-06-18 VITALS — BP 132/78 | HR 75 | Ht 64.0 in | Wt 221.8 lb

## 2023-06-18 DIAGNOSIS — I10 Essential (primary) hypertension: Secondary | ICD-10-CM

## 2023-06-18 DIAGNOSIS — I951 Orthostatic hypotension: Secondary | ICD-10-CM | POA: Diagnosis not present

## 2023-06-18 NOTE — Patient Instructions (Signed)
 Follow-Up: At [facility], you and your health needs are our priority.  As part of our continuing mission to provide you with exceptional heart care, our providers are all part of one team.  This team includes your primary Cardiologist (physician) and Advanced Practice Providers or APPs (Physician Assistants and Nurse Practitioners) who all work together to provide you with the care you need, when you need it.  Your next appointment:   As Needed  Provider:   Ahmad Alert, MD

## 2023-06-19 DIAGNOSIS — Z13820 Encounter for screening for osteoporosis: Secondary | ICD-10-CM | POA: Diagnosis not present

## 2023-06-19 DIAGNOSIS — Z780 Asymptomatic menopausal state: Secondary | ICD-10-CM | POA: Diagnosis not present

## 2023-06-20 ENCOUNTER — Encounter: Payer: Self-pay | Admitting: Physician Assistant

## 2023-06-20 ENCOUNTER — Ambulatory Visit: Admitting: Physician Assistant

## 2023-06-20 DIAGNOSIS — Z96651 Presence of right artificial knee joint: Secondary | ICD-10-CM | POA: Diagnosis not present

## 2023-06-20 MED ORDER — METHYLPREDNISOLONE ACETATE 40 MG/ML IJ SUSP
40.0000 mg | INTRAMUSCULAR | Status: AC | PRN
Start: 2023-06-20 — End: 2023-06-20
  Administered 2023-06-20: 40 mg via INTRA_ARTICULAR

## 2023-06-20 MED ORDER — LIDOCAINE HCL 1 % IJ SOLN
1.0000 mL | INTRAMUSCULAR | Status: AC | PRN
  Administered 2023-06-20: 1 mL

## 2023-06-20 NOTE — Progress Notes (Signed)
 Office Visit Note   [name]           Date of Birth: 12/08/1945           MRN: [id number] Visit Date: 06/20/2023              Requested by: Azalia Leo, MD [address] [place],  [place] [address] PCP: Azalia Leo, MD  No chief complaint on file.     HPI: Patient is 87-month status post right total knee replacement.  She last saw Dr. Lucienne Ryder about a week and a half ago.  She comes in today with a several week history which she said began in PT of pain over her medial side of her knee denies any fever chills  Assessment & Plan: Visit Diagnoses:  1. Status post total right knee replacement     Plan: Findings consistent with Pez anserine irritation will go forward with an injection today.  If does not improve should contact us   Follow-Up Instructions: Return if symptoms worsen or fail to improve.   Ortho Exam  Patient is alert, oriented, no adenopathy, well-dressed, normal affect, normal respiratory effort. Right knee well-healed surgical incision no erythema no ecchymosis no effusion.  Compartments are soft and nontender no cellulitis.  Negative Homans' sign.  She has 120 degrees of flexion and is able to sustain full extension focally tender over the Pez anserine    Imaging: No results found. No images are attached to the encounter.  Labs: Lab Results  Component Value Date   HGBA1C 5.5 09/05/2022   HGBA1C 5.7 (H) 01/29/2022   HGBA1C 5.7 (H) 09/27/2021   REPTSTATUS 05/20/2021 FINAL 05/14/2021   CULT  05/14/2021    NO GROWTH 5 DAYS Performed at [facility], [address] [place], [place] [address]    LABORGA ESCHERICHIA COLI (A) 05/14/2021     Lab Results  Component Value Date   ALBUMIN 4.0 09/05/2022   ALBUMIN 4.5 09/27/2021   ALBUMIN 3.1 (L) 05/15/2021   PREALBUMIN 17.2 (L) 05/15/2021    Lab Results  Component Value Date   MG 2.0 09/27/2021   MG 1.8 05/15/2021   MG 1.4 (L) 05/14/2021   Lab Results  Component Value Date    VD25OH 50.7 01/29/2022   VD25OH 47.7 09/27/2021   VD25OH 55.9 02/07/2021    Lab Results  Component Value Date   PREALBUMIN 17.2 (L) 05/15/2021      Latest Ref Rng & Units 09/15/2022    3:58 AM 09/05/2022    2:44 PM 05/16/2021    9:19 AM  CBC EXTENDED  WBC 4.0 - 10.5 K/uL 11.7  9.4  9.3   RBC 3.87 - 5.11 MIL/uL 3.86  3.96  3.77   Hemoglobin 12.0 - 15.0 g/dL 86.5  78.4  69.6   HCT 36.0 - 46.0 % 36.9  37.0  34.5   Platelets 150 - 400 K/uL 185  230  148      There is no height or weight on file to calculate BMI.  Orders:  No orders of the defined types were placed in this encounter.  No orders of the defined types were placed in this encounter.    Procedures: Large Joint Inj: R knee on 06/20/2023 9:35 AM Indications: pain and diagnostic evaluation Details: 22 G 1.5 in needle, medial approach  Arthrogram: No  Medications: 40 mg methylPREDNISolone  acetate 40 MG/ML; 1 mL lidocaine  1 % Outcome: tolerated well, no immediate complications  After obtaining verbal  consent focal area over the Pez anserine was located maximal tenderness 1 cc of lidocaine  and 1 cc of Depo-Medrol  were injected without difficulty after sterilely prepping the area Procedure, treatment alternatives, risks and benefits explained, specific risks discussed. Consent was given by the patient.      Clinical Data: No additional findings.  ROS:  All other systems negative, except as noted in the HPI. Review of Systems  Objective: Vital Signs: There were no vitals taken for this visit.  Specialty Comments:  EXAM: MRI LUMBAR SPINE WITHOUT CONTRAST   TECHNIQUE: Multiplanar, multisequence MR imaging of the lumbar spine was performed. No intravenous contrast was administered.   COMPARISON:  Lumbar spine radiographs 02/05/2022, lumbar spine MRI 10/10/2018   FINDINGS: Segmentation: Standard; the lowest formed disc space is designated L5-S1.   Alignment: There is dextrocurvature centered at L2-L3  which appears increased since 2020. Trace retrolisthesis of T12 on L1 and trace anterolisthesis of L4 on L5 are unchanged.   Vertebrae: Vertebral body heights are preserved. Background marrow signal is normal. There is no suspicious marrow signal abnormality or marrow edema.   Conus medullaris and cauda equina: Conus extends to the L1 level. Conus and cauda equina appear normal. An incidental conjoined right L5-S1 nerve root is noted. 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   Paraspinal and other soft tissues: Unremarkable.   Disc levels:   There is mild disc desiccation and narrowing at T12-L1. There is otherwise overall mild desiccation without significant loss of height.   T12-L1: There is a mild disc bulge without significant spinal canal or neural foraminal stenosis, unchanged.   L1-L2: No significant spinal canal or neural foraminal stenosis   L2-L3: There is a mild disc bulge which is minimally increased since 2020 but without significant spinal canal or neural foraminal stenosis   L3-L4: There is a mild disc bulge with a superimposed right foraminal/extraforaminal protrusion which comes in close proximity to the exiting L3 nerve root but without significant spinal canal or neural foraminal stenosis, unchanged.   L4-L5: There is trace anterolisthesis, and advanced left worse than right facet arthropathy with effusions resulting in mild left subarticular zone narrowing and no significant neural foraminal stenosis. The left subarticular zone narrowing is slightly worsened since 2020.   L5-S1: There is moderate bilateral facet arthropathy without significant spinal canal or neural foraminal stenosis, not significantly changed.   IMPRESSION: 1. 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 2. Moderate facet  arthropathy at L5-S1 without significant spinal canal or neural foraminal stenosis, not significantly changed. 3. Right foraminal/extraforaminal protrusion at L3-L4 which comes in close proximity to the exiting L3 nerve root, unchanged. 4. Dextrocurvature centered at L2-L3 appears increased since 2020.     Electronically Signed   By: Eldora Greet M.D.   On: 05/29/2022 17:08  PMFS History: Patient Active Problem List   Diagnosis Date Noted   Orthostatic hypotension 06/18/2023   Status post right knee replacement 09/16/2022   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/14/2022   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   BMI 40.0-44.9, adult (HCC)-current bmi 37.3 03/20/2022   Morbid obesity (HCC) 01/30/2022   BMI 37.0-37.9, adult 01/30/2022   B12 deficiency due to diet 09/27/2021   SOB (shortness of breath) on exertion 09/27/2021   Chronic vertigo 09/27/2021   Other hyperlipidemia 08/28/2021   Vitamin D  deficiency 08/28/2021   Pre-diabetes 06/28/2021  Sepsis (HCC) 05/15/2021   Hypoxia 05/15/2021   Essential hypertension 05/15/2021   DM (diabetes mellitus), type 2 (HCC) 05/15/2021   Hypomagnesemia 05/15/2021   UTI (urinary tract infection) 05/14/2021   Acute exacerbation of chronic low back pain 03/10/2021   OSA on CPAP 09/03/2018   Mild persistent asthma without complication 09/03/2018   Class 3 severe obesity with serious comorbidity and body mass index (BMI) of 40.0 to 44.9 in adult 04/14/2018   Unilateral primary osteoarthritis, left knee 04/14/2018   Asthma in adult, mild intermittent, uncomplicated 02/26/2017   Allergic rhinitis 09/05/2013   Past Medical History:  Diagnosis Date   Anxiety    Arthritis    Asthma    Back pain    Bilateral swelling of feet    Constipation    Depression    Diabetes mellitus without complication (HCC)    Gallbladder problem    GERD (gastroesophageal reflux disease)    History of low potassium    Hypertension    Joint pain     Obesity    Palpitations    Shortness of breath    Sleep apnea     Family History  Problem Relation Age of Onset   Diabetes Mother    Heart disease Mother    Anxiety disorder Mother    Obesity Mother    Diabetes Father    Hypertension Father    Obesity Father     Past Surgical History:  Procedure Laterality Date   ABDOMINAL HYSTERECTOMY     APPENDECTOMY     CATARACT EXTRACTION, BILATERAL     CHOLECYSTECTOMY     KNEE SURGERY Left    arthroscopy   SHOULDER SURGERY Right    TOTAL KNEE ARTHROPLASTY Right 09/14/2022   Procedure: RIGHT TOTAL KNEE ARTHROPLASTY;  Surgeon: Arnie Lao, MD;  Location: [facility];  Service: Orthopedics;  Laterality: Right;   Social History   Occupational History   Not on file  Tobacco Use   Smoking status: Never    Passive exposure: Never   Smokeless tobacco: Never  Vaping Use   Vaping status: Never Used  Substance and Sexual Activity   Alcohol use: No    Alcohol/week: 0.0 standard drinks of alcohol   Drug use: No   Sexual activity: Yes

## 2023-06-20 NOTE — Progress Notes (Deleted)
 Office Visit Note   [name]           Date of Birth: Jan 30, 1945           MRN: [id number] Visit Date: 06/20/2023              Requested by: Azalia Leo, MD [address] [place],  [place] [address] PCP: Azalia Leo, MD  No chief complaint on file.     HPI: ***  Assessment & Plan: Visit Diagnoses: No diagnosis found.  Plan: ***  Follow-Up Instructions: No follow-ups on file.   Ortho Exam  Patient is alert, oriented, no adenopathy, well-dressed, normal affect, normal respiratory effort. ***    Imaging: No results found. No images are attached to the encounter.  Labs: Lab Results  Component Value Date   HGBA1C 5.5 09/05/2022   HGBA1C 5.7 (H) 01/29/2022   HGBA1C 5.7 (H) 09/27/2021   REPTSTATUS 05/20/2021 FINAL 05/14/2021   CULT  05/14/2021    NO GROWTH 5 DAYS Performed at [facility], [address], [place], [place] [address]    LABORGA ESCHERICHIA COLI (A) 05/14/2021     Lab Results  Component Value Date   ALBUMIN 4.0 09/05/2022   ALBUMIN 4.5 09/27/2021   ALBUMIN 3.1 (L) 05/15/2021   PREALBUMIN 17.2 (L) 05/15/2021    Lab Results  Component Value Date   MG 2.0 09/27/2021   MG 1.8 05/15/2021   MG 1.4 (L) 05/14/2021   Lab Results  Component Value Date   VD25OH 50.7 01/29/2022   VD25OH 47.7 09/27/2021   VD25OH 55.9 02/07/2021    Lab Results  Component Value Date   PREALBUMIN 17.2 (L) 05/15/2021      Latest Ref Rng & Units 09/15/2022    3:58 AM 09/05/2022    2:44 PM 05/16/2021    9:19 AM  CBC EXTENDED  WBC 4.0 - 10.5 K/uL 11.7  9.4  9.3   RBC 3.87 - 5.11 MIL/uL 3.86  3.96  3.77   Hemoglobin 12.0 - 15.0 g/dL 25.3  66.4  40.3   HCT 36.0 - 46.0 % 36.9  37.0  34.5   Platelets 150 - 400 K/uL 185  230  148      There is no height or weight on file to calculate BMI.  Orders:  No orders of the defined types were placed in this encounter.  No orders of the defined types were placed in this encounter.     Procedures: No procedures performed  Clinical Data: No additional findings.  ROS:  All other systems negative, except as noted in the HPI. Review of Systems  Objective: Vital Signs: There were no vitals taken for this visit.  Specialty Comments:  EXAM: MRI LUMBAR SPINE WITHOUT CONTRAST   TECHNIQUE: Multiplanar, multisequence MR imaging of the lumbar spine was performed. No intravenous contrast was administered.   COMPARISON:  Lumbar spine radiographs 02/05/2022, lumbar spine MRI 10/10/2018   FINDINGS: Segmentation: Standard; the lowest formed disc space is designated L5-S1.   Alignment: There is dextrocurvature centered at L2-L3 which appears increased since 2020. Trace retrolisthesis of T12 on L1 and trace anterolisthesis of L4 on L5 are unchanged.   Vertebrae: Vertebral body heights are preserved. Background marrow signal is normal. There is no suspicious marrow signal abnormality or marrow edema.   Conus medullaris and cauda equina: Conus extends to the L1 level. Conus and cauda equina appear normal. An incidental conjoined right L5-S1 nerve root is noted. 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   Paraspinal and other soft tissues: Unremarkable.   Disc levels:   There is mild disc desiccation and narrowing at T12-L1. There is otherwise overall mild desiccation without significant loss of height.   T12-L1: There is a mild disc bulge without significant spinal canal or neural foraminal stenosis, unchanged.   L1-L2: No significant spinal canal or neural foraminal stenosis   L2-L3: There is a mild disc bulge which is minimally increased since 2020 but without significant spinal canal or neural foraminal stenosis   L3-L4: There is a mild disc bulge with a superimposed right foraminal/extraforaminal protrusion which comes in close proximity to the exiting L3 nerve root but without significant spinal canal or neural foraminal stenosis, unchanged.   L4-L5: There  is trace anterolisthesis, and advanced left worse than right facet arthropathy with effusions resulting in mild left subarticular zone narrowing and no significant neural foraminal stenosis. The left subarticular zone narrowing is slightly worsened since 2020.   L5-S1: There is moderate bilateral facet arthropathy without significant spinal canal or neural foraminal stenosis, not significantly changed.   IMPRESSION: 1. 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 2. Moderate facet arthropathy at L5-S1 without significant spinal canal or neural foraminal stenosis, not significantly changed. 3. Right foraminal/extraforaminal protrusion at L3-L4 which comes in close proximity to the exiting L3 nerve root, unchanged. 4. Dextrocurvature centered at L2-L3 appears increased since 2020.     Electronically Signed   By: Eldora Greet M.D.   On: 05/29/2022 17:08  PMFS History: Patient Active Problem List   Diagnosis Date Noted   Orthostatic hypotension 06/18/2023   Status post right knee replacement 09/16/2022   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/14/2022   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   BMI 40.0-44.9, adult (HCC)-current bmi 37.3 03/20/2022   Morbid obesity (HCC) 01/30/2022   BMI 37.0-37.9, adult 01/30/2022   B12 deficiency due to diet 09/27/2021   SOB (shortness of breath) on exertion 09/27/2021   Chronic vertigo 09/27/2021   Other hyperlipidemia 08/28/2021   Vitamin D  deficiency 08/28/2021   Pre-diabetes 06/28/2021   Sepsis (HCC) 05/15/2021   Hypoxia 05/15/2021   Essential hypertension 05/15/2021   DM (diabetes mellitus), type 2 (HCC) 05/15/2021   Hypomagnesemia 05/15/2021   UTI (urinary tract infection) 05/14/2021   Acute exacerbation of chronic low back pain 03/10/2021   OSA on CPAP 09/03/2018   Mild  persistent asthma without complication 09/03/2018   Class 3 severe obesity with serious comorbidity and body mass index (BMI) of 40.0 to 44.9 in adult 04/14/2018   Unilateral primary osteoarthritis, left knee 04/14/2018   Asthma in adult, mild intermittent, uncomplicated 02/26/2017   Allergic rhinitis 09/05/2013   Past Medical History:  Diagnosis Date   Anxiety    Arthritis    Asthma    Back pain    Bilateral swelling of feet    Constipation    Depression    Diabetes mellitus without complication (HCC)    Gallbladder problem    GERD (gastroesophageal reflux disease)    History of low potassium    Hypertension    Joint pain    Obesity    Palpitations    Shortness of breath    Sleep apnea     Family History  Problem Relation Age of Onset   Diabetes Mother    Heart disease Mother  Anxiety disorder Mother    Obesity Mother    Diabetes Father    Hypertension Father    Obesity Father     Past Surgical History:  Procedure Laterality Date   ABDOMINAL HYSTERECTOMY     APPENDECTOMY     CATARACT EXTRACTION, BILATERAL     CHOLECYSTECTOMY     KNEE SURGERY Left    arthroscopy   SHOULDER SURGERY Right    TOTAL KNEE ARTHROPLASTY Right 09/14/2022   Procedure: RIGHT TOTAL KNEE ARTHROPLASTY;  Surgeon: Arnie Lao, MD;  Location: [facility];  Service: Orthopedics;  Laterality: Right;   Social History   Occupational History   Not on file  Tobacco Use   Smoking status: Never    Passive exposure: Never   Smokeless tobacco: Never  Vaping Use   Vaping status: Never Used  Substance and Sexual Activity   Alcohol use: No    Alcohol/week: 0.0 standard drinks of alcohol   Drug use: No   Sexual activity: Yes

## 2023-06-24 DIAGNOSIS — G4733 Obstructive sleep apnea (adult) (pediatric): Secondary | ICD-10-CM | POA: Diagnosis not present

## 2023-07-22 DIAGNOSIS — G4733 Obstructive sleep apnea (adult) (pediatric): Secondary | ICD-10-CM | POA: Diagnosis not present

## 2023-07-24 DIAGNOSIS — G4733 Obstructive sleep apnea (adult) (pediatric): Secondary | ICD-10-CM | POA: Diagnosis not present

## 2023-08-02 ENCOUNTER — Encounter: Payer: Self-pay | Admitting: Orthopedic Surgery

## 2023-08-02 ENCOUNTER — Ambulatory Visit: Admitting: Orthopedic Surgery

## 2023-08-02 DIAGNOSIS — M25512 Pain in left shoulder: Secondary | ICD-10-CM

## 2023-08-02 DIAGNOSIS — G8929 Other chronic pain: Secondary | ICD-10-CM

## 2023-08-02 NOTE — Progress Notes (Signed)
 Office Visit Note   [name]           Date of Birth: January 23, 1945           MRN: [id number] Visit Date: 08/02/2023 Requested by: Stephane Leita DEL, MD [address] [place],  [place] [address] PCP: Stephane Leita DEL, MD  Subjective: Chief Complaint  Patient presents with   Left Shoulder - Pain    HPI: Crystal Jackson is a 78 y.o. female who presents to the office reporting continued left shoulder pain.  She has a known history of left shoulder arthritis.  She reports pain and weakness along with decreased abduction.  Pain radiates from the shoulder to the elbow.  She does have a husband at home.  Right shoulder functions reasonably well.  Pain does wake her from sleep at night.  Very limiting in terms of doing activities of daily living.  She cannot get her hand behind her head to deal with her hair..                ROS: All systems reviewed are negative as they relate to the chief complaint within the history of present illness.  Patient denies fevers or chills.  Assessment & Plan: Visit Diagnoses:  1. Chronic left shoulder pain     Plan: Impression is end-stage left shoulder arthritis with a lot of coarseness and popping with passive range of motion.  A little bit of weakness in the higher ranges of her elevation.  Subscap strength feels intact.  Plan is reverse shoulder replacement.  The risk and benefits are discussed with the patient including not limited to infection nerve vessel damage incomplete pain relief as well as incomplete functional restoration.  Based on her somewhat limited preop range of motion I think she may have a slightly more difficult time than most getting a very good functional range of motion.  Nonetheless this will be a very predictable pain relieving operation for her.  Expected rehab time is discussed.  All questions answered.  Thin cut CT scan pending.  Rationale for preoperative templating and planning is discussed with the use of diagrams and  models.  Follow-Up Instructions: No follow-ups on file.   Orders:  Orders Placed This Encounter  Procedures   CT SHOULDER LEFT WO CONTRAST   No orders of the defined types were placed in this encounter.     Procedures: No procedures performed   Clinical Data: No additional findings.  Objective: Vital Signs: There were no vitals taken for this visit.  Physical Exam:  Constitutional: Patient appears well-developed HEENT:  Head: Normocephalic Eyes:EOM are normal Neck: Normal range of motion Cardiovascular: Normal rate Pulmonary/chest: Effort normal Neurologic: Patient is alert Skin: Skin is warm Psychiatric: Patient has normal mood and affect  Ortho Exam: Ortho exam demonstrates range of motion on the left of 25/80/100.  Subscap and external rotation strength 5 out of 5.  Little bit weaker and painful with abduction.  No discrete AC joint tenderness.  Deltoid fires.  Motor and sensory function to the hand is intact.  No other masses lymphadenopathy or skin changes noted in the left shoulder girdle region.  Specialty Comments:  EXAM: MRI LUMBAR SPINE WITHOUT CONTRAST   TECHNIQUE: Multiplanar, multisequence MR imaging of the lumbar spine was performed. No intravenous contrast was administered.   COMPARISON:  Lumbar spine radiographs 02/05/2022, lumbar spine MRI 10/10/2018   FINDINGS: Segmentation: Standard; the lowest formed disc space is designated L5-S1.   Alignment: There is dextrocurvature  centered at L2-L3 which appears increased since 2020. Trace retrolisthesis of T12 on L1 and trace anterolisthesis of L4 on L5 are unchanged.   Vertebrae: Vertebral body heights are preserved. Background marrow signal is normal. There is no suspicious marrow signal abnormality or marrow edema.   Conus medullaris and cauda equina: Conus extends to the L1 level. Conus and cauda equina appear normal. An incidental conjoined right L5-S1 nerve root is noted. 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   Paraspinal and other soft tissues: Unremarkable.   Disc levels:   There is mild disc desiccation and narrowing at T12-L1. There is otherwise overall mild desiccation without significant loss of height.   T12-L1: There is a mild disc bulge without significant spinal canal or neural foraminal stenosis, unchanged.   L1-L2: No significant spinal canal or neural foraminal stenosis   L2-L3: There is a mild disc bulge which is minimally increased since 2020 but without significant spinal canal or neural foraminal stenosis   L3-L4: There is a mild disc bulge with a superimposed right foraminal/extraforaminal protrusion which comes in close proximity to the exiting L3 nerve root but without significant spinal canal or neural foraminal stenosis, unchanged.   L4-L5: There is trace anterolisthesis, and advanced left worse than right facet arthropathy with effusions resulting in mild left subarticular zone narrowing and no significant neural foraminal stenosis. The left subarticular zone narrowing is slightly worsened since 2020.   L5-S1: There is moderate bilateral facet arthropathy without significant spinal canal or neural foraminal stenosis, not significantly changed.   IMPRESSION: 1. 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 2. Moderate facet arthropathy at L5-S1 without significant spinal canal or neural foraminal stenosis, not significantly changed. 3. Right foraminal/extraforaminal protrusion at L3-L4 which comes in close proximity to the exiting L3 nerve root, unchanged. 4. Dextrocurvature centered at L2-L3 appears increased since 2020.     Electronically Signed   By: Maude Harry M.D.   On: 05/29/2022 17:08  Imaging: No results found.   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   BMI 40.0-44.9, adult (HCC)-current bmi 37.3 03/20/2022   Morbid obesity (HCC) 01/30/2022   BMI 37.0-37.9, adult 01/30/2022   B12 deficiency due to diet 09/27/2021   SOB (shortness of breath) on exertion 09/27/2021   Chronic vertigo 09/27/2021   Other hyperlipidemia 08/28/2021   Vitamin D  deficiency 08/28/2021   Pre-diabetes 06/28/2021   Sepsis (HCC) 05/15/2021   Hypoxia 05/15/2021   Essential hypertension 05/15/2021   DM (diabetes mellitus), type 2 (HCC) 05/15/2021   Hypomagnesemia 05/15/2021   UTI (urinary tract infection) 05/14/2021   Acute exacerbation of chronic low back pain 03/10/2021   OSA on CPAP 09/03/2018   Mild persistent asthma without complication 09/03/2018   Class 3 severe obesity with serious comorbidity and body mass index (BMI) of 40.0 to 44.9 in adult 04/14/2018   Unilateral primary osteoarthritis, left knee 04/14/2018   Asthma in adult, mild intermittent, uncomplicated 02/26/2017   Allergic rhinitis 09/05/2013   Past Medical History:  Diagnosis Date   Anxiety    Arthritis    Asthma    Back pain    Bilateral swelling of feet    Constipation  Depression    Diabetes mellitus without complication (HCC)    Gallbladder problem    GERD (gastroesophageal reflux disease)    History of low potassium    Hypertension    Joint pain    Obesity    Palpitations    Shortness of breath    Sleep apnea     Family History  Problem Relation Age of Onset   Diabetes Mother    Heart disease Mother    Anxiety disorder Mother    Obesity Mother    Diabetes Father    Hypertension Father    Obesity Father     Past Surgical History:  Procedure Laterality Date   ABDOMINAL HYSTERECTOMY     APPENDECTOMY     CATARACT EXTRACTION, BILATERAL     CHOLECYSTECTOMY     KNEE  SURGERY Left    arthroscopy   SHOULDER SURGERY Right    TOTAL KNEE ARTHROPLASTY Right 09/14/2022   Procedure: RIGHT TOTAL KNEE ARTHROPLASTY;  Surgeon: Vernetta Lonni GRADE, MD;  Location: [facility];  Service: Orthopedics;  Laterality: Right;   Social History   Occupational History   Not on file  Tobacco Use   Smoking status: Never    Passive exposure: Never   Smokeless tobacco: Never  Vaping Use   Vaping status: Never Used  Substance and Sexual Activity   Alcohol use: No    Alcohol/week: 0.0 standard drinks of alcohol   Drug use: No   Sexual activity: Yes

## 2023-08-06 ENCOUNTER — Encounter: Payer: Self-pay | Admitting: Orthopedic Surgery

## 2023-08-09 ENCOUNTER — Other Ambulatory Visit: Payer: Self-pay

## 2023-08-09 DIAGNOSIS — G8929 Other chronic pain: Secondary | ICD-10-CM

## 2023-08-09 DIAGNOSIS — M19012 Primary osteoarthritis, left shoulder: Secondary | ICD-10-CM

## 2023-08-17 NOTE — Therapy (Addendum)
 OUTPATIENT PHYSICAL THERAPY UPPER EXTREMITY   Patient Name: Crystal Jackson MRN: [id number] DOB:1945/08/08, 78 y.o., female Today's Date: 08/27/2023  END OF SESSION:    PT End of Session - 08/27/23 1345     Visit Number 1    Number of Visits 17    Date for PT Re-Evaluation 10/22/23    PT Start Time 1300    PT Stop Time 1340    PT Time Calculation (min) 40 min    Activity Tolerance Patient tolerated treatment well    Behavior During Therapy WFL for tasks assessed/performed          Past Medical History:  Diagnosis Date   Anxiety    Arthritis    Asthma    Back pain    Bilateral swelling of feet    Constipation    Depression    Diabetes mellitus without complication (HCC)    Gallbladder problem    GERD (gastroesophageal reflux disease)    History of low potassium    Hypertension    Joint pain    Obesity    Palpitations    Shortness of breath    Sleep apnea    Past Surgical History:  Procedure Laterality Date   ABDOMINAL HYSTERECTOMY     APPENDECTOMY     CATARACT EXTRACTION, BILATERAL     CHOLECYSTECTOMY     KNEE SURGERY Left    arthroscopy   SHOULDER SURGERY Right    TOTAL KNEE ARTHROPLASTY Right 09/14/2022   Procedure: RIGHT TOTAL KNEE ARTHROPLASTY;  Surgeon: Vernetta Lonni GRADE, MD;  Location: [facility];  Service: Orthopedics;  Laterality: Right;   Patient Active Problem List   Diagnosis Date Noted   Orthostatic hypotension 06/18/2023   Status post right knee replacement 09/16/2022   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/14/2022   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   BMI 40.0-44.9, adult (HCC)-current bmi 37.3 03/20/2022   Morbid obesity (HCC) 01/30/2022   BMI 37.0-37.9, adult 01/30/2022   B12 deficiency due to diet 09/27/2021   SOB (shortness of breath) on exertion 09/27/2021   Chronic vertigo 09/27/2021   Other hyperlipidemia 08/28/2021   Vitamin D  deficiency 08/28/2021   Pre-diabetes 06/28/2021   Sepsis (HCC) 05/15/2021   Hypoxia  05/15/2021   Essential hypertension 05/15/2021   DM (diabetes mellitus), type 2 (HCC) 05/15/2021   Hypomagnesemia 05/15/2021   UTI (urinary tract infection) 05/14/2021   Acute exacerbation of chronic low back pain 03/10/2021   OSA on CPAP 09/03/2018   Mild persistent asthma without complication 09/03/2018   Class 3 severe obesity with serious comorbidity and body mass index (BMI) of 40.0 to 44.9 in adult 04/14/2018   Unilateral primary osteoarthritis, left knee 04/14/2018   Asthma in adult, mild intermittent, uncomplicated 02/26/2017   Allergic rhinitis 09/05/2013    PCP: Stephane Leita DEL, MD   REFERRING PROVIDER: Addie Cordella Hamilton, MD   REFERRING DIAG: 5744673411 (ICD-10-CM) - Chronic left shoulder pain M19.012 (ICD-10-CM) - Primary osteoarthritis of left shoulder  Rationale for Evaluation and Treatment: Rehabilitation  THERAPY DIAG:  Acute pain of left shoulder  Stiffness of left shoulder, not elsewhere classified  ONSET DATE: Fall 2024  SUBJECTIVE:  SUBJECTIVE STATEMENT: Pt reports L shoulder pain that started last year around December.  Pt c/o decreased motion, pain and grinding in the joint. Had a cortisone injection and states it did not help much. Hand dominance:  Right PERTINENT HISTORY: R TKA 9/24  PAIN:  Are you having pain?  Yes: NPRS scale: 0>10/10 Pain location: anterior, posterior and some radiating pain Pain description: achy, sharp Aggravating factors: lifting, reaching Relieving factors: OTC  PRECAUTIONS: None  RED FLAGS: None   WEIGHT BEARING RESTRICTIONS: No  FALLS:  Has patient fallen in last 6 months? No  LIVING ENVIRONMENT: Lives with: lives with their family  OCCUPATION: retired  PLOF: Independent  PATIENT GOALS: 1  NEXT MD VISIT:not  scheduled  OBJECTIVE:  Note: Objective measures were completed at Evaluation unless otherwise noted.  DIAGNOSTIC FINDINGS:  none  PATIENT SURVEYS :  PSFS: THE PATIENT SPECIFIC FUNCTIONAL SCALE  Place score of 0-10 (0 = unable to perform activity and 10 = able to perform activity at the same level as before injury or problem)  Activity Date: 08/27/23    Doing hair 1    2.lifting arm up 3    3.rotating arm 2    4.      Total Score 2      Total Score = Sum of activity scores/number of activities  Minimally Detectable Change: 3 points (for single activity); 2 points (for average score)  [facility] (nd). The Patient Specific Functional Scale . Retrieved from [email or web address].com.pt   COGNITION: Overall cognitive status: Within functional limits for tasks assessed     SENSATION: WFL  POSTURE: Slight rounded shoulders  UPPER EXTREMITY ROM:    AROM/PROM Right eval Left eval  Shoulder flexion  95/108  Shoulder extension  WNL  Shoulder abduction  55/118  Shoulder adduction    Shoulder internal rotation  50%  Shoulder external rotation  32/48  Elbow flexion    Elbow extension    Wrist flexion    Wrist extension    Wrist ulnar deviation    Wrist radial deviation    Wrist pronation    Wrist supination    (Blank rows = not tested)  UPPER EXTREMITY MMT:  MMT Right eval Left eval  Shoulder flexion  3+  Shoulder extension    Shoulder abduction  3+  Shoulder adduction    Shoulder internal rotation  4  Shoulder external rotation  3+  Middle trapezius    Lower trapezius    Elbow flexion    Elbow extension    Wrist flexion    Wrist extension    Wrist ulnar deviation    Wrist radial deviation    Wrist pronation    Wrist supination    Grip strength (lbs)    (Blank rows = not tested)  SHOULDER SPECIAL TESTS: Impingement tests: Painful arc test: positive  Rotator cuff assessment: Drop arm test:  negative  JOINT MOBILITY TESTING:  Decreased throughout GH joint  PALPATION:  1+ tenderness at supraspinatus insertion  TREATMENT DATE: 08/27/23 Evaluation completed followed by trial of HEP.   PATIENT EDUCATION: Education details: HEP Person educated: Patient Education method: Solicitor, Actor cues, and Verbal cues Education comprehension: verbalized understanding, returned demonstration, and verbal cues required  HOME EXERCISE PROGRAM: Access Code: [email or web address] URL: [email or web address] Date: 08/27/2023 Prepared by: Burnard Meth  Exercises - Seated Scapular Retraction  - 2 x daily - 7 x weekly - 2 sets - 10 reps - Seated Shoulder Shrugs  - 2 x daily - 7 x weekly - 2 sets - 10 reps - Standing Isometric Shoulder Internal Rotation at Doorway  - 2 x daily - 7 x weekly - 2 sets - 10 reps - 3 hold - Isometric Shoulder Flexion at Wall  - 2 x daily - 7 x weekly - 2 sets - 10 reps - 3 hold - Isometric Shoulder Extension at Wall  - 2 x daily - 7 x weekly - 2 sets - 10 reps - 3 hold  ASSESSMENT:  CLINICAL IMPRESSION: Patient is a 78 y.o. female who was seen today for physical therapy evaluation and treatment for left shoulder pain and restriction. Pt presents with decreased motion, decreased strength, pain and restricted ADLs.  Pt would benefit from skilled PT to address these deficits and return to previous LOA.   OBJECTIVE IMPAIRMENTS: decreased activity tolerance, decreased ROM, decreased strength, impaired flexibility, and pain.   ACTIVITY LIMITATIONS: carrying, bathing, dressing, reach over head, and hygiene/grooming  PARTICIPATION LIMITATIONS: meal prep, cleaning, driving, and hairstyling  PERSONAL FACTORS: -none  REHAB POTENTIAL: Good  CLINICAL DECISION MAKING: Stable/uncomplicated  EVALUATION COMPLEXITY:  Moderate  GOALS: Goals reviewed with patient? Yes  SHORT TERM GOALS: Target date: 09/25/2023    Pt to be independent with HEP. Baseline: Goal status: INITIAL  2.  Decrease pain by 1 level. Baseline: up to 10/10 Goal status: INITIAL  LONG TERM GOALS: Target date: 10/22/2023    Pt is independent with a self progressive HEP. Baseline:  Goal status: INITIAL  2.  Decrease pain to max 2/10 with all activities. Baseline:  Goal status: INITIAL  3.  Increase AROM to WFL. Baseline:  Goal status: INITIAL  4.  Increase strength to at least 4+/5 throughout.  Baseline: 3-3+ Goal status: INITIAL  5.  Increase score of PSFS by at least 2 points for detectable change. Baseline: 3 Goal status: INITIAL  PLAN: PT FREQUENCY: 2x/week  PT DURATION: 8 weeks  PLANNED INTERVENTIONS: [id number]- PT Re-evaluation, 97110-Therapeutic exercises, 97530- Therapeutic activity, 97112- Neuromuscular re-education, 97535- Self Care, [id number]- Manual therapy, [id number]- Electrical stimulation (unattended), Patient/Family education, Cryotherapy, and Moist heat  PLAN FOR NEXT SESSION: Start on pulleys, UBE      Burnard Meth, PT 08/28/23  8:01 AM

## 2023-08-24 DIAGNOSIS — G4733 Obstructive sleep apnea (adult) (pediatric): Secondary | ICD-10-CM | POA: Diagnosis not present

## 2023-08-27 ENCOUNTER — Ambulatory Visit

## 2023-08-27 ENCOUNTER — Other Ambulatory Visit: Payer: Self-pay

## 2023-08-27 DIAGNOSIS — M25512 Pain in left shoulder: Secondary | ICD-10-CM | POA: Diagnosis not present

## 2023-08-27 DIAGNOSIS — M25612 Stiffness of left shoulder, not elsewhere classified: Secondary | ICD-10-CM | POA: Diagnosis not present

## 2023-09-08 NOTE — Therapy (Signed)
 OUTPATIENT PHYSICAL THERAPY SHOULDER TREATMENT   Patient Name: Crystal Jackson MRN: [id number] DOB:1945-04-23, 78 y.o., female Today's Date: 09/09/2023  END OF SESSION:    PT End of Session - 09/09/23 1237     Visit Number 2    Number of Visits 17    Date for PT Re-Evaluation 10/22/23    PT Start Time 1155    PT Stop Time 1233    PT Time Calculation (min) 38 min    Activity Tolerance Patient tolerated treatment well    Behavior During Therapy WFL for tasks assessed/performed           Past Medical History:  Diagnosis Date   Anxiety    Arthritis    Asthma    Back pain    Bilateral swelling of feet    Constipation    Depression    Diabetes mellitus without complication (HCC)    Gallbladder problem    GERD (gastroesophageal reflux disease)    History of low potassium    Hypertension    Joint pain    Obesity    Palpitations    Shortness of breath    Sleep apnea    Past Surgical History:  Procedure Laterality Date   ABDOMINAL HYSTERECTOMY     APPENDECTOMY     CATARACT EXTRACTION, BILATERAL     CHOLECYSTECTOMY     KNEE SURGERY Left    arthroscopy   SHOULDER SURGERY Right    TOTAL KNEE ARTHROPLASTY Right 09/14/2022   Procedure: RIGHT TOTAL KNEE ARTHROPLASTY;  Surgeon: Vernetta Lonni GRADE, MD;  Location: [facility];  Service: Orthopedics;  Laterality: Right;   Patient Active Problem List   Diagnosis Date Noted   Orthostatic hypotension 06/18/2023   Status post right knee replacement 09/16/2022   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/14/2022   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   BMI 40.0-44.9, adult (HCC)-current bmi 37.3 03/20/2022   Morbid obesity (HCC) 01/30/2022   BMI 37.0-37.9, adult 01/30/2022   B12 deficiency due to diet 09/27/2021   SOB (shortness of breath) on exertion 09/27/2021   Chronic vertigo 09/27/2021   Other hyperlipidemia 08/28/2021   Vitamin D  deficiency 08/28/2021   Pre-diabetes 06/28/2021   Sepsis (HCC) 05/15/2021   Hypoxia  05/15/2021   Essential hypertension 05/15/2021   DM (diabetes mellitus), type 2 (HCC) 05/15/2021   Hypomagnesemia 05/15/2021   UTI (urinary tract infection) 05/14/2021   Acute exacerbation of chronic low back pain 03/10/2021   OSA on CPAP 09/03/2018   Mild persistent asthma without complication 09/03/2018   Class 3 severe obesity with serious comorbidity and body mass index (BMI) of 40.0 to 44.9 in adult 04/14/2018   Unilateral primary osteoarthritis, left knee 04/14/2018   Asthma in adult, mild intermittent, uncomplicated 02/26/2017   Allergic rhinitis 09/05/2013    PCP: Stephane Leita DEL, MD   REFERRING PROVIDER: Vernetta Lonni GRADE*   REFERRING DIAG: [phone] (ICD-10-CM) - Chronic left shoulder pain M19.012 (ICD-10-CM) - Primary osteoarthritis of left shoulder  Rationale for Evaluation and Treatment: Rehabilitation  THERAPY DIAG:  Acute pain of left shoulder  Stiffness of left shoulder, not elsewhere classified  ONSET DATE: Fall 2024  SUBJECTIVE:  SUBJECTIVE STATEMENT: Pt reports L shoulder pain is minimal at rest but increases with motion. Hand dominance:  Right PERTINENT HISTORY: R TKA 9/24  PAIN:  Are you having pain?  Yes: NPRS scale: 5/10 Pain location: anterior, posterior and some radiating pain Pain description: achy, sharp Aggravating factors: lifting, reaching Relieving factors: OTC  PRECAUTIONS: None  RED FLAGS: None   WEIGHT BEARING RESTRICTIONS: No  FALLS:  Has patient fallen in last 6 months? No  LIVING ENVIRONMENT: Lives with: lives with their family  OCCUPATION: retired  PLOF: Independent  PATIENT GOALS: 1  NEXT MD VISIT:not scheduled  OBJECTIVE:  Note: Objective measures were completed at Evaluation unless otherwise noted.  DIAGNOSTIC  FINDINGS:  none  PATIENT SURVEYS :  PSFS: THE PATIENT SPECIFIC FUNCTIONAL SCALE  Place score of 0-10 (0 = unable to perform activity and 10 = able to perform activity at the same level as before injury or problem)  Activity Date: 08/27/23    Doing hair 1    2.lifting arm up 3    3.rotating arm 2    4.      Total Score 2      Total Score = Sum of activity scores/number of activities  Minimally Detectable Change: 3 points (for single activity); 2 points (for average score)  [facility] (nd). The Patient Specific Functional Scale . Retrieved from [email or web address]   COGNITION: Overall cognitive status: Within functional limits for tasks assessed     SENSATION: WFL  POSTURE: Slight rounded shoulders  UPPER EXTREMITY ROM:    AROM/PROM Right eval Left eval  Shoulder flexion  95/108  Shoulder extension  WNL  Shoulder abduction  55/118  Shoulder adduction    Shoulder internal rotation  50%  Shoulder external rotation  32/48  Elbow flexion    Elbow extension    Wrist flexion    Wrist extension    Wrist ulnar deviation    Wrist radial deviation    Wrist pronation    Wrist supination    (Blank rows = not tested)  UPPER EXTREMITY MMT:  MMT Right eval Left eval  Shoulder flexion  3+  Shoulder extension    Shoulder abduction  3+  Shoulder adduction    Shoulder internal rotation  4  Shoulder external rotation  3+  Middle trapezius    Lower trapezius    Elbow flexion    Elbow extension    Wrist flexion    Wrist extension    Wrist ulnar deviation    Wrist radial deviation    Wrist pronation    Wrist supination    Grip strength (lbs)    (Blank rows = not tested)  SHOULDER SPECIAL TESTS: Impingement tests: Painful arc test: positive  Rotator cuff assessment: Drop arm test: negative  JOINT MOBILITY TESTING:  Decreased throughout GH joint  PALPATION:  1+ tenderness at supraspinatus  insertion  TREATMENT DATE: 09/09/23 Ther Ex: Full HEP review Isometrics with ball in doorway 10x 5 sec hold ER,IR, ext, abd, flex There Act: For reaching, pulling, dressing Supine wand for flexion 5x Supine chest press 5x Ladder walks 10x Neuro Re ed : for posture Tband rows Red 2x10 Scap squeezes 2x10    08/27/23 Evaluation completed followed by trial of HEP.   PATIENT EDUCATION: Education details: HEP Person educated: Patient Education method: Solicitor, Actor cues, and Verbal cues Education comprehension: verbalized understanding, returned demonstration, and verbal cues required  HOME EXERCISE PROGRAM: Access Code: [email or web address] URL: [email or web address] Date: 08/27/2023 Prepared by: Burnard Meth  Exercises - Seated Scapular Retraction  - 2 x daily - 7 x weekly - 2 sets - 10 reps - Seated Shoulder Shrugs  - 2 x daily - 7 x weekly - 2 sets - 10 reps - Standing Isometric Shoulder Internal Rotation at Doorway  - 2 x daily - 7 x weekly - 2 sets - 10 reps - 3 hold - Isometric Shoulder Flexion at Wall  - 2 x daily - 7 x weekly - 2 sets - 10 reps - 3 hold - Isometric Shoulder Extension at Wall  - 2 x daily - 7 x weekly - 2 sets - 10 reps - 3 hold  ASSESSMENT:  CLINICAL IMPRESSION: Patient needed VC and tactile cuing with isometrics with the ball.  Demonstrated understanding.  OBJECTIVE IMPAIRMENTS: decreased activity tolerance, decreased ROM, decreased strength, impaired flexibility, and pain.   ACTIVITY LIMITATIONS: carrying, bathing, dressing, reach over head, and hygiene/grooming  PARTICIPATION LIMITATIONS: meal prep, cleaning, driving, and hairstyling  PERSONAL FACTORS: -none  REHAB POTENTIAL: Good  CLINICAL DECISION MAKING: Stable/uncomplicated  EVALUATION COMPLEXITY: Moderate  GOALS: Goals reviewed with  patient? Yes  SHORT TERM GOALS: Target date: 09/25/2023    Pt to be independent with HEP. Baseline: Goal status: INITIAL  2.  Decrease pain by 1 level. Baseline: up to 10/10 Goal status: INITIAL  LONG TERM GOALS: Target date: 10/22/2023    Pt is independent with a self progressive HEP. Baseline:  Goal status: INITIAL  2.  Decrease pain to max 2/10 with all activities. Baseline:  Goal status: INITIAL  3.  Increase AROM to WFL. Baseline:  Goal status: INITIAL  4.  Increase strength to at least 4+/5 throughout.  Baseline: 3-3+ Goal status: INITIAL  5.  Increase score of PSFS by at least 2 points for detectable change. Baseline: 3 Goal status: INITIAL  PLAN: PT FREQUENCY: 2x/week  PT DURATION: 8 weeks  PLANNED INTERVENTIONS: 97164- PT Re-evaluation, 97110-Therapeutic exercises, 97530- Therapeutic activity, [id number]- Neuromuscular re-education, 97535- Self Care, [id number]- Manual therapy, [id number]- Electrical stimulation (unattended), Patient/Family education, Cryotherapy, and Moist heat  PLAN FOR NEXT SESSION: Start on pulleys or UBE .     Burnard Meth, PT 09/09/23  12:39 PM

## 2023-09-09 ENCOUNTER — Ambulatory Visit

## 2023-09-09 DIAGNOSIS — M25612 Stiffness of left shoulder, not elsewhere classified: Secondary | ICD-10-CM | POA: Diagnosis not present

## 2023-09-09 DIAGNOSIS — M25512 Pain in left shoulder: Secondary | ICD-10-CM

## 2023-09-12 NOTE — Therapy (Signed)
 OUTPATIENT PHYSICAL THERAPY SHOULDER TREATMENT   Patient Name: Crystal Jackson MRN: [id number] DOB:1945/12/15, 78 y.o., female Today's Date: 09/13/2023  END OF SESSION:    PT End of Session - 09/13/23 1127     Visit Number 3    Number of Visits 17    Date for PT Re-Evaluation 10/22/23    PT Start Time 1100    PT Stop Time 1139    PT Time Calculation (min) 39 min    Activity Tolerance Patient tolerated treatment well    Behavior During Therapy WFL for tasks assessed/performed            Past Medical History:  Diagnosis Date   Anxiety    Arthritis    Asthma    Back pain    Bilateral swelling of feet    Constipation    Depression    Diabetes mellitus without complication (HCC)    Gallbladder problem    GERD (gastroesophageal reflux disease)    History of low potassium    Hypertension    Joint pain    Obesity    Palpitations    Shortness of breath    Sleep apnea    Past Surgical History:  Procedure Laterality Date   ABDOMINAL HYSTERECTOMY     APPENDECTOMY     CATARACT EXTRACTION, BILATERAL     CHOLECYSTECTOMY     KNEE SURGERY Left    arthroscopy   SHOULDER SURGERY Right    TOTAL KNEE ARTHROPLASTY Right 09/14/2022   Procedure: RIGHT TOTAL KNEE ARTHROPLASTY;  Surgeon: Vernetta Lonni GRADE, MD;  Location: [facility];  Service: Orthopedics;  Laterality: Right;   Patient Active Problem List   Diagnosis Date Noted   Orthostatic hypotension 06/18/2023   Status post right knee replacement 09/16/2022   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/14/2022   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   BMI 40.0-44.9, adult (HCC)-current bmi 37.3 03/20/2022   Morbid obesity (HCC) 01/30/2022   BMI 37.0-37.9, adult 01/30/2022   B12 deficiency due to diet 09/27/2021   SOB (shortness of breath) on exertion 09/27/2021   Chronic vertigo 09/27/2021   Other hyperlipidemia 08/28/2021   Vitamin D  deficiency 08/28/2021   Pre-diabetes 06/28/2021   Sepsis (HCC) 05/15/2021    Hypoxia 05/15/2021   Essential hypertension 05/15/2021   DM (diabetes mellitus), type 2 (HCC) 05/15/2021   Hypomagnesemia 05/15/2021   UTI (urinary tract infection) 05/14/2021   Acute exacerbation of chronic low back pain 03/10/2021   OSA on CPAP 09/03/2018   Mild persistent asthma without complication 09/03/2018   Class 3 severe obesity with serious comorbidity and body mass index (BMI) of 40.0 to 44.9 in adult 04/14/2018   Unilateral primary osteoarthritis, left knee 04/14/2018   Asthma in adult, mild intermittent, uncomplicated 02/26/2017   Allergic rhinitis 09/05/2013    PCP: Stephane Leita DEL, MD   REFERRING PROVIDER: Vernetta Lonni GRADE*   REFERRING DIAG: [phone] (ICD-10-CM) - Chronic left shoulder pain M19.012 (ICD-10-CM) - Primary osteoarthritis of left shoulder  Rationale for Evaluation and Treatment: Rehabilitation  THERAPY DIAG:  Acute pain of left shoulder  Stiffness of left shoulder, not elsewhere classified  Muscle weakness (generalized)  ONSET DATE: Fall 2024  SUBJECTIVE:  SUBJECTIVE STATEMENT: Pt reports reaching is so difficult affecting dressing, meal prep.    Hand dominance:  Right PERTINENT HISTORY: R TKA 9/24  PAIN:  Are you having pain?  Yes: NPRS scale: 5/10 Pain location: anterior, posterior and some radiating pain Pain description: achy, sharp Aggravating factors: lifting, reaching Relieving factors: OTC  PRECAUTIONS: None  RED FLAGS: None   WEIGHT BEARING RESTRICTIONS: No  FALLS:  Has patient fallen in last 6 months? No  LIVING ENVIRONMENT: Lives with: lives with their family  OCCUPATION: retired  PLOF: Independent  PATIENT GOALS: 1  NEXT MD VISIT:not scheduled  OBJECTIVE:  Note: Objective measures were completed at Evaluation unless  otherwise noted.  DIAGNOSTIC FINDINGS:  none  PATIENT SURVEYS :  PSFS: THE PATIENT SPECIFIC FUNCTIONAL SCALE  Place score of 0-10 (0 = unable to perform activity and 10 = able to perform activity at the same level as before injury or problem)  Activity Date: 08/27/23    Doing hair 1    2.lifting arm up 3    3.rotating arm 2    4.      Total Score 2      Total Score = Sum of activity scores/number of activities  Minimally Detectable Change: 3 points (for single activity); 2 points (for average score)  [facility] (nd). The Patient Specific Functional Scale . Retrieved from [email or web address]   COGNITION: Overall cognitive status: Within functional limits for tasks assessed     SENSATION: WFL  POSTURE: Slight rounded shoulders  UPPER EXTREMITY ROM:    AROM/PROM Right eval Left eval  Shoulder flexion  95/108  Shoulder extension  WNL  Shoulder abduction  55/118  Shoulder adduction    Shoulder internal rotation  50%  Shoulder external rotation  32/48  Elbow flexion    Elbow extension    Wrist flexion    Wrist extension    Wrist ulnar deviation    Wrist radial deviation    Wrist pronation    Wrist supination    (Blank rows = not tested)  UPPER EXTREMITY MMT:  MMT Right eval Left eval  Shoulder flexion  3+  Shoulder extension    Shoulder abduction  3+  Shoulder adduction    Shoulder internal rotation  4  Shoulder external rotation  3+  Middle trapezius    Lower trapezius    Elbow flexion    Elbow extension    Wrist flexion    Wrist extension    Wrist ulnar deviation    Wrist radial deviation    Wrist pronation    Wrist supination    Grip strength (lbs)    (Blank rows = not tested)  SHOULDER SPECIAL TESTS: Impingement tests: Painful arc test: positive  Rotator cuff assessment: Drop arm test: negative  JOINT MOBILITY TESTING:  Decreased throughout GH joint  PALPATION:  1+  tenderness at supraspinatus insertion  TREATMENT DATE: 09/13/23 L Ther Ex: Pulleys 3 min Isometrics with ball in doorway 10x 5 sec hold ER,IR, ext, abd, flex There Act: For reaching, pulling, dressing Supine wand for flexion 10x  Supine chest press 10x Ladder walks 5x Neuro Re ed : for posture Tband rows Red 2x10 Scap squeezes 2x10 Manual  PROM/scap mobs    09/09/23 Ther Ex: Full HEP review Isometrics with ball in doorway 10x 5 sec hold ER,IR, ext, abd, flex There Act: For reaching, pulling, dressing Supine wand for flexion 5x Supine chest press 5x Ladder walks 10x Neuro Re ed : for posture Tband rows Red 2x10 Scap squeezes 2x10    08/27/23 Evaluation completed followed by trial of HEP.   PATIENT EDUCATION: Education details: HEP Person educated: Patient Education method: Solicitor, Actor cues, and Verbal cues Education comprehension: verbalized understanding, returned demonstration, and verbal cues required  HOME EXERCISE PROGRAM: Access Code: [email or web address] URL: [email or web address] Date: 08/27/2023 Prepared by: Burnard Meth  Exercises - Seated Scapular Retraction  - 2 x daily - 7 x weekly - 2 sets - 10 reps - Seated Shoulder Shrugs  - 2 x daily - 7 x weekly - 2 sets - 10 reps - Standing Isometric Shoulder Internal Rotation at Doorway  - 2 x daily - 7 x weekly - 2 sets - 10 reps - 3 hold - Isometric Shoulder Flexion at Wall  - 2 x daily - 7 x weekly - 2 sets - 10 reps - 3 hold - Isometric Shoulder Extension at Wall  - 2 x daily - 7 x weekly - 2 sets - 10 reps - 3 hold  ASSESSMENT:  CLINICAL IMPRESSION: Patient needed VC with wand exercises.  Demonstrated understanding.  OBJECTIVE IMPAIRMENTS: decreased activity tolerance, decreased ROM, decreased strength, impaired flexibility, and pain.   ACTIVITY  LIMITATIONS: carrying, bathing, dressing, reach over head, and hygiene/grooming  PARTICIPATION LIMITATIONS: meal prep, cleaning, driving, and hairstyling  PERSONAL FACTORS: -none  REHAB POTENTIAL: Good  CLINICAL DECISION MAKING: Stable/uncomplicated  EVALUATION COMPLEXITY: Moderate  GOALS: Goals reviewed with patient? Yes  SHORT TERM GOALS: Target date: 09/25/2023    Pt to be independent with HEP. Baseline: Goal status: INITIAL  2.  Decrease pain by 1 level. Baseline: up to 10/10 Goal status: INITIAL  LONG TERM GOALS: Target date: 10/22/2023    Pt is independent with a self progressive HEP. Baseline:  Goal status: INITIAL  2.  Decrease pain to max 2/10 with all activities. Baseline:  Goal status: INITIAL  3.  Increase AROM to WFL. Baseline:  Goal status: INITIAL  4.  Increase strength to at least 4+/5 throughout.  Baseline: 3-3+ Goal status: INITIAL  5.  Increase score of PSFS by at least 2 points for detectable change. Baseline: 3 Goal status: INITIAL  PLAN: PT FREQUENCY: 2x/week  PT DURATION: 8 weeks  PLANNED INTERVENTIONS: 97164- PT Re-evaluation, 97110-Therapeutic exercises, 97530- Therapeutic activity, [id number]- Neuromuscular re-education, 97535- Self Care, [id number]- Manual therapy, [id number]- Electrical stimulation (unattended), Patient/Family education, Cryotherapy, and Moist heat  PLAN FOR NEXT SESSION: Continue with increasing ROM activity.     Burnard Meth, PT 09/13/23  11:29 AM

## 2023-09-13 ENCOUNTER — Ambulatory Visit

## 2023-09-13 DIAGNOSIS — M25612 Stiffness of left shoulder, not elsewhere classified: Secondary | ICD-10-CM | POA: Diagnosis not present

## 2023-09-13 DIAGNOSIS — M25512 Pain in left shoulder: Secondary | ICD-10-CM

## 2023-09-13 DIAGNOSIS — M6281 Muscle weakness (generalized): Secondary | ICD-10-CM | POA: Diagnosis not present

## 2023-09-15 NOTE — Therapy (Signed)
 OUTPATIENT PHYSICAL THERAPY SHOULDER TREATMENT   Patient Name: Crystal Jackson MRN: [id number] DOB:[birth year], 78 y.o., female Today's Date: 09/16/2023  END OF SESSION:    PT End of Session - 09/16/23 1355     Visit Number 4    Number of Visits 17    Date for PT Re-Evaluation 10/22/23    PT Start Time 1315    PT Stop Time 1353    PT Time Calculation (min) 38 min    Activity Tolerance Patient tolerated treatment well    Behavior During Therapy WFL for tasks assessed/performed             Past Medical History:  Diagnosis Date   Anxiety    Arthritis    Asthma    Back pain    Bilateral swelling of feet    Constipation    Depression    Diabetes mellitus without complication (HCC)    Gallbladder problem    GERD (gastroesophageal reflux disease)    History of low potassium    Hypertension    Joint pain    Obesity    Palpitations    Shortness of breath    Sleep apnea    Past Surgical History:  Procedure Laterality Date   ABDOMINAL HYSTERECTOMY     APPENDECTOMY     CATARACT EXTRACTION, BILATERAL     CHOLECYSTECTOMY     KNEE SURGERY Left    arthroscopy   SHOULDER SURGERY Right    TOTAL KNEE ARTHROPLASTY Right 09/14/2022   Procedure: RIGHT TOTAL KNEE ARTHROPLASTY;  Surgeon: Vernetta Lonni GRADE, MD;  Location: [facility];  Service: Orthopedics;  Laterality: Right;   Patient Active Problem List   Diagnosis Date Noted   Orthostatic hypotension 06/18/2023   Status post right knee replacement 09/16/2022   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/14/2022   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   BMI 40.0-44.9, adult (HCC)-current bmi 37.3 03/20/2022   Morbid obesity (HCC) 01/30/2022   BMI 37.0-37.9, adult 01/30/2022   B12 deficiency due to diet 09/27/2021   SOB (shortness of breath) on exertion 09/27/2021   Chronic vertigo 09/27/2021   Other hyperlipidemia 08/28/2021   Vitamin D  deficiency 08/28/2021   Pre-diabetes 06/28/2021   Sepsis (HCC) 05/15/2021    Hypoxia 05/15/2021   Essential hypertension 05/15/2021   DM (diabetes mellitus), type 2 (HCC) 05/15/2021   Hypomagnesemia 05/15/2021   UTI (urinary tract infection) 05/14/2021   Acute exacerbation of chronic low back pain 03/10/2021   OSA on CPAP 09/03/2018   Mild persistent asthma without complication 09/03/2018   Class 3 severe obesity with serious comorbidity and body mass index (BMI) of 40.0 to 44.9 in adult 04/14/2018   Unilateral primary osteoarthritis, left knee 04/14/2018   Asthma in adult, mild intermittent, uncomplicated 02/26/2017   Allergic rhinitis 09/05/2013    PCP: Stephane Leita DEL, MD   REFERRING PROVIDER: Vernetta Lonni GRADE*   REFERRING DIAG: [phone] (ICD-10-CM) - Chronic left shoulder pain M19.012 (ICD-10-CM) - Primary osteoarthritis of left shoulder  Rationale for Evaluation and Treatment: Rehabilitation  THERAPY DIAG:  Acute pain of left shoulder  Stiffness of left shoulder, not elsewhere classified  Muscle weakness (generalized)  ONSET DATE: Fall 2024  SUBJECTIVE:  SUBJECTIVE STATEMENT: Pt reports lifting arm up hurts and doesn't go far.   Hand dominance:  Right PERTINENT HISTORY: R TKA 9/24  PAIN:  Are you having pain?  Yes: NPRS scale: 5/10 Pain location: anterior, posterior and some radiating pain Pain description: achy, sharp Aggravating factors: lifting, reaching Relieving factors: OTC  PRECAUTIONS: None  RED FLAGS: None   WEIGHT BEARING RESTRICTIONS: No  FALLS:  Has patient fallen in last 6 months? No  LIVING ENVIRONMENT: Lives with: lives with their family  OCCUPATION: retired  PLOF: Independent  PATIENT GOALS: 1  NEXT MD VISIT:not scheduled  OBJECTIVE:  Note: Objective measures were completed at Evaluation unless otherwise  noted.  DIAGNOSTIC FINDINGS:  none  PATIENT SURVEYS :  PSFS: THE PATIENT SPECIFIC FUNCTIONAL SCALE  Place score of 0-10 (0 = unable to perform activity and 10 = able to perform activity at the same level as before injury or problem)  Activity Date: 08/27/23    Doing hair 1    2.lifting arm up 3    3.rotating arm 2    4.      Total Score 2      Total Score = Sum of activity scores/number of activities  Minimally Detectable Change: 3 points (for single activity); 2 points (for average score)  [facility] (nd). The Patient Specific Functional Scale . Retrieved from [email or web address]   COGNITION: Overall cognitive status: Within functional limits for tasks assessed     SENSATION: WFL  POSTURE: Slight rounded shoulders  UPPER EXTREMITY ROM:    AROM/PROM Right eval Left eval  Shoulder flexion  95/108  Shoulder extension  WNL  Shoulder abduction  55/118  Shoulder adduction    Shoulder internal rotation  50%  Shoulder external rotation  32/48  Elbow flexion    Elbow extension    Wrist flexion    Wrist extension    Wrist ulnar deviation    Wrist radial deviation    Wrist pronation    Wrist supination    (Blank rows = not tested)  UPPER EXTREMITY MMT:  MMT Right eval Left eval  Shoulder flexion  3+  Shoulder extension    Shoulder abduction  3+  Shoulder adduction    Shoulder internal rotation  4  Shoulder external rotation  3+  Middle trapezius    Lower trapezius    Elbow flexion    Elbow extension    Wrist flexion    Wrist extension    Wrist ulnar deviation    Wrist radial deviation    Wrist pronation    Wrist supination    Grip strength (lbs)    (Blank rows = not tested)  SHOULDER SPECIAL TESTS: Impingement tests: Painful arc test: positive  Rotator cuff assessment: Drop arm test: negative  JOINT MOBILITY TESTING:  Decreased throughout GH joint  PALPATION:  1+ tenderness at  supraspinatus insertion  TREATMENT DATE: 09/16/23 Ther Ex: Pulleys 3 min Isometrics with ball in doorway 10x 5 sec hold ER,IR, ext, abd, flex There Act: For reaching, pulling, dressing Supine wand for flexion 10x  Supine chest press 10x Ladder walks 5x Neuro Re ed : for posture Tband rows Red 2x10 Scap squeezes 2x10 Manual  PROM/scap mobs   09/13/23 L Ther Ex: Pulleys 3 min Isometrics with ball in doorway 10x 5 sec hold ER,IR, ext, abd, flex There Act: For reaching, pulling, dressing Supine wand for flexion 10x  Supine chest press 10x Ladder walks 5x Neuro Re ed : for posture Tband rows Red 2x10 Scap squeezes 2x10 Manual  PROM/scap mobs    09/09/23 Ther Ex: Full HEP review Isometrics with ball in doorway 10x 5 sec hold ER,IR, ext, abd, flex There Act: For reaching, pulling, dressing Supine wand for flexion 5x Supine chest press 5x Ladder walks 10x Neuro Re ed : for posture Tband rows Red 2x10 Scap squeezes 2x10    08/27/23 Evaluation completed followed by trial of HEP.   PATIENT EDUCATION: Education details: HEP Person educated: Patient Education method: Solicitor, Actor cues, and Verbal cues Education comprehension: verbalized understanding, returned demonstration, and verbal cues required  HOME EXERCISE PROGRAM: Access Code: [email or web address] URL: [email or web address] Date: 08/27/2023 Prepared by: Burnard Meth  Exercises - Seated Scapular Retraction  - 2 x daily - 7 x weekly - 2 sets - 10 reps - Seated Shoulder Shrugs  - 2 x daily - 7 x weekly - 2 sets - 10 reps - Standing Isometric Shoulder Internal Rotation at Doorway  - 2 x daily - 7 x weekly - 2 sets - 10 reps - 3 hold - Isometric Shoulder Flexion at Wall  - 2 x daily - 7 x weekly - 2 sets - 10 reps - 3 hold - Isometric Shoulder Extension at Wall  - 2 x  daily - 7 x weekly - 2 sets - 10 reps - 3 hold  ASSESSMENT:  CLINICAL IMPRESSION: Patient needed assist with flexion exercises due to pain coming down.    OBJECTIVE IMPAIRMENTS: decreased activity tolerance, decreased ROM, decreased strength, impaired flexibility, and pain.   ACTIVITY LIMITATIONS: carrying, bathing, dressing, reach over head, and hygiene/grooming  PARTICIPATION LIMITATIONS: meal prep, cleaning, driving, and hairstyling  PERSONAL FACTORS: -none  REHAB POTENTIAL: Good  CLINICAL DECISION MAKING: Stable/uncomplicated  EVALUATION COMPLEXITY: Moderate  GOALS: Goals reviewed with patient? Yes  SHORT TERM GOALS: Target date: 09/25/2023    Pt to be independent with HEP. Baseline: Goal status: INITIAL  2.  Decrease pain by 1 level. Baseline: up to 10/10 Goal status: INITIAL  LONG TERM GOALS: Target date: 10/22/2023    Pt is independent with a self progressive HEP. Baseline:  Goal status: INITIAL  2.  Decrease pain to max 2/10 with all activities. Baseline:  Goal status: INITIAL  3.  Increase AROM to WFL. Baseline:  Goal status: INITIAL  4.  Increase strength to at least 4+/5 throughout.  Baseline: 3-3+ Goal status: INITIAL  5.  Increase score of PSFS by at least 2 points for detectable change. Baseline: 3 Goal status: INITIAL  PLAN: PT FREQUENCY: 2x/week  PT DURATION: 8 weeks  PLANNED INTERVENTIONS: 97164- PT Re-evaluation, 97110-Therapeutic exercises, 97530- Therapeutic activity, [id number]- Neuromuscular re-education, 97535- Self Care, [id number]- Manual therapy, [id number]- Electrical stimulation (unattended), Patient/Family education, Cryotherapy, and Moist heat  PLAN FOR NEXT SESSION:Continue with increasing ROM activity.     Burnard Meth, PT 09/16/23  2:04 PM

## 2023-09-16 ENCOUNTER — Ambulatory Visit

## 2023-09-16 DIAGNOSIS — M6281 Muscle weakness (generalized): Secondary | ICD-10-CM

## 2023-09-16 DIAGNOSIS — M25512 Pain in left shoulder: Secondary | ICD-10-CM

## 2023-09-16 DIAGNOSIS — M25612 Stiffness of left shoulder, not elsewhere classified: Secondary | ICD-10-CM

## 2023-09-18 NOTE — Therapy (Signed)
 OUTPATIENT PHYSICAL THERAPY SHOULDER TREATMENT   Patient Name: Crystal Jackson MRN: [id number] DOB:July 28, 1945, 78 y.o., female Today's Date: 09/19/2023  END OF SESSION:    PT End of Session - 09/19/23 1515     Visit Number 5    Number of Visits 17    Date for Recertification  10/22/23    PT Start Time 1345    PT Stop Time 1423    PT Time Calculation (min) 38 min    Activity Tolerance Patient tolerated treatment well    Behavior During Therapy WFL for tasks assessed/performed              Past Medical History:  Diagnosis Date   Anxiety    Arthritis    Asthma    Back pain    Bilateral swelling of feet    Constipation    Depression    Diabetes mellitus without complication (HCC)    Gallbladder problem    GERD (gastroesophageal reflux disease)    History of low potassium    Hypertension    Joint pain    Obesity    Palpitations    Shortness of breath    Sleep apnea    Past Surgical History:  Procedure Laterality Date   ABDOMINAL HYSTERECTOMY     APPENDECTOMY     CATARACT EXTRACTION, BILATERAL     CHOLECYSTECTOMY     KNEE SURGERY Left    arthroscopy   SHOULDER SURGERY Right    TOTAL KNEE ARTHROPLASTY Right 09/14/2022   Procedure: RIGHT TOTAL KNEE ARTHROPLASTY;  Surgeon: Vernetta Lonni GRADE, MD;  Location: [facility];  Service: Orthopedics;  Laterality: Right;   Patient Active Problem List   Diagnosis Date Noted   Orthostatic hypotension 06/18/2023   Status post right knee replacement 09/16/2022   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/14/2022   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   BMI 40.0-44.9, adult (HCC)-current bmi 37.3 03/20/2022   Morbid obesity (HCC) 01/30/2022   BMI 37.0-37.9, adult 01/30/2022   B12 deficiency due to diet 09/27/2021   SOB (shortness of breath) on exertion 09/27/2021   Chronic vertigo 09/27/2021   Other hyperlipidemia 08/28/2021   Vitamin D  deficiency 08/28/2021   Pre-diabetes 06/28/2021   Sepsis (HCC) 05/15/2021    Hypoxia 05/15/2021   Essential hypertension 05/15/2021   DM (diabetes mellitus), type 2 (HCC) 05/15/2021   Hypomagnesemia 05/15/2021   UTI (urinary tract infection) 05/14/2021   Acute exacerbation of chronic low back pain 03/10/2021   OSA on CPAP 09/03/2018   Mild persistent asthma without complication 09/03/2018   Class 3 severe obesity with serious comorbidity and body mass index (BMI) of 40.0 to 44.9 in adult 04/14/2018   Unilateral primary osteoarthritis, left knee 04/14/2018   Asthma in adult, mild intermittent, uncomplicated 02/26/2017   Allergic rhinitis 09/05/2013    PCP: Stephane Leita DEL, MD   REFERRING PROVIDER: Vernetta Lonni GRADE*   REFERRING DIAG: [phone] (ICD-10-CM) - Chronic left shoulder pain M19.012 (ICD-10-CM) - Primary osteoarthritis of left shoulder  Rationale for Evaluation and Treatment: Rehabilitation  THERAPY DIAG:  Acute pain of left shoulder  Stiffness of left shoulder, not elsewhere classified  Muscle weakness (generalized)  ONSET DATE: Fall 2024  SUBJECTIVE:  SUBJECTIVE STATEMENT: Pt reports pain is constantly there.   Hand dominance:  Right PERTINENT HISTORY: R TKA 9/24  PAIN:  Are you having pain?  Yes: NPRS scale: 5/10 Pain location: anterior, posterior and some radiating pain Pain description: achy, sharp Aggravating factors: lifting, reaching Relieving factors: OTC  PRECAUTIONS: None  RED FLAGS: None   WEIGHT BEARING RESTRICTIONS: No  FALLS:  Has patient fallen in last 6 months? No  LIVING ENVIRONMENT: Lives with: lives with their family  OCCUPATION: retired  PLOF: Independent  PATIENT GOALS: 1  NEXT MD VISIT:not scheduled  OBJECTIVE:  Note: Objective measures were completed at Evaluation unless otherwise  noted.  DIAGNOSTIC FINDINGS:  none  PATIENT SURVEYS :  PSFS: THE PATIENT SPECIFIC FUNCTIONAL SCALE  Place score of 0-10 (0 = unable to perform activity and 10 = able to perform activity at the same level as before injury or problem)  Activity Date: 08/27/23    Doing hair 1    2.lifting arm up 3    3.rotating arm 2    4.      Total Score 2      Total Score = Sum of activity scores/number of activities  Minimally Detectable Change: 3 points (for single activity); 2 points (for average score)  [facility] (nd). The Patient Specific Functional Scale . Retrieved from [email or web address]   COGNITION: Overall cognitive status: Within functional limits for tasks assessed     SENSATION: WFL  POSTURE: Slight rounded shoulders  UPPER EXTREMITY ROM:    AROM/PROM Right eval Left eval  Shoulder flexion  95/108  Shoulder extension  WNL  Shoulder abduction  55/118  Shoulder adduction    Shoulder internal rotation  50%  Shoulder external rotation  32/48  Elbow flexion    Elbow extension    Wrist flexion    Wrist extension    Wrist ulnar deviation    Wrist radial deviation    Wrist pronation    Wrist supination    (Blank rows = not tested)  UPPER EXTREMITY MMT:  MMT Right eval Left eval  Shoulder flexion  3+  Shoulder extension    Shoulder abduction  3+  Shoulder adduction    Shoulder internal rotation  4  Shoulder external rotation  3+  Middle trapezius    Lower trapezius    Elbow flexion    Elbow extension    Wrist flexion    Wrist extension    Wrist ulnar deviation    Wrist radial deviation    Wrist pronation    Wrist supination    Grip strength (lbs)    (Blank rows = not tested)  SHOULDER SPECIAL TESTS: Impingement tests: Painful arc test: positive  Rotator cuff assessment: Drop arm test: negative  JOINT MOBILITY TESTING:  Decreased throughout GH joint  PALPATION:  1+ tenderness at  supraspinatus insertion  TREATMENT DATE: 09/19/23 Ther Ex: Pulleys 3 min Isometrics with ball in doorway 10x 5 sec hold ER,IR, ext, abd, flex There Act: For reaching, pulling, dressing Supine wand for flexion 10x  Supine chest press 10x Ladder walks 5x Neuro Re ed : for posture Tband rows Red 2x10 Scap squeezes 2x10 Manual  PROM/scap mobs    09/16/23 Ther Ex: Pulleys 3 min Isometrics with ball in doorway 10x 5 sec hold ER,IR, ext, abd, flex There Act: For reaching, pulling, dressing Supine wand for flexion 10x  Supine chest press 10x Ladder walks 5x Neuro Re ed : for posture Tband rows Red 2x10 Scap squeezes 2x10 Manual  PROM/scap mobs   09/13/23 L Ther Ex: Pulleys 3 min Isometrics with ball in doorway 10x 5 sec hold ER,IR, ext, abd, flex There Act: For reaching, pulling, dressing Supine wand for flexion 10x  Supine chest press 10x Ladder walks 5x Neuro Re ed : for posture Tband rows Red 2x10 Scap squeezes 2x10 Manual  PROM/scap mobs    09/09/23 Ther Ex: Full HEP review Isometrics with ball in doorway 10x 5 sec hold ER,IR, ext, abd, flex There Act: For reaching, pulling, dressing Supine wand for flexion 5x Supine chest press 5x Ladder walks 10x Neuro Re ed : for posture Tband rows Red 2x10 Scap squeezes 2x10    08/27/23 Evaluation completed followed by trial of HEP.   PATIENT EDUCATION: Education details: HEP Person educated: Patient Education method: Solicitor, Actor cues, and Verbal cues Education comprehension: verbalized understanding, returned demonstration, and verbal cues required  HOME EXERCISE PROGRAM: Access Code: [email or web address] URL: [email or web address] Date: 09/19/2023 Prepared by: Burnard Meth  Exercises - Seated Scapular Retraction  - 2 x daily - 7 x weekly - 2 sets - 10 reps -  Seated Shoulder Shrugs  - 2 x daily - 7 x weekly - 2 sets - 10 reps - Standing Isometric Shoulder Internal Rotation at Doorway  - 2 x daily - 7 x weekly - 2 sets - 10 reps - 3 hold - Isometric Shoulder Flexion at Wall  - 2 x daily - 7 x weekly - 2 sets - 10 reps - 3 hold - Isometric Shoulder Extension at Wall  - 2 x daily - 7 x weekly - 2 sets - 10 reps - 3 hold - Standing Isometric Shoulder External Rotation with Doorway  - 2 x daily - 7 x weekly - 2 sets - 10 reps - 3 hold - Isometric Shoulder Abduction at Wall  - 2 x daily - 7 x weekly - 2 sets - 10 reps - 3 hold   ASSESSMENT:  CLINICAL IMPRESSION: Patient needed VC for posture with T band rows.  Demonstrated understanding.   OBJECTIVE IMPAIRMENTS: decreased activity tolerance, decreased ROM, decreased strength, impaired flexibility, and pain.   ACTIVITY LIMITATIONS: carrying, bathing, dressing, reach over head, and hygiene/grooming  PARTICIPATION LIMITATIONS: meal prep, cleaning, driving, and hairstyling  PERSONAL FACTORS: -none  REHAB POTENTIAL: Good  CLINICAL DECISION MAKING: Stable/uncomplicated  EVALUATION COMPLEXITY: Moderate  GOALS: Goals reviewed with patient? Yes  SHORT TERM GOALS: Target date: 09/25/2023    Pt to be independent with HEP. Baseline: Goal status: MET 09/19/23  2.  Decrease pain by 1 level. Baseline: up to 10/10 Goal status: ONGOING 09/19/23  LONG TERM GOALS: Target date: 10/22/2023    Pt is independent with a self progressive HEP. Baseline:  Goal status: INITIAL  2.  Decrease pain to max 2/10 with all activities. Baseline:  Goal status: INITIAL  3.  Increase AROM to WFL. Baseline:  Goal status: INITIAL  4.  Increase strength to at least 4+/5 throughout.  Baseline: 3-3+ Goal status: INITIAL  5.  Increase score of PSFS by at least 2 points for detectable change. Baseline: 3 Goal status: INITIAL  PLAN: PT FREQUENCY: 2x/week  PT DURATION: 8 weeks  PLANNED INTERVENTIONS: 97164-  PT Re-evaluation, 97110-Therapeutic exercises, 97530- Therapeutic activity, [id number]- Neuromuscular re-education, 97535- Self Care, 02859- Manual therapy, G0283- Electrical stimulation (unattended), Patient/Family education, Cryotherapy, and Moist heat  PLAN FOR NEXT SESSION:Continue with treatment possible total shoulder replacement surgery pending.    Burnard Meth, PT 09/19/23  3:16 PM

## 2023-09-19 ENCOUNTER — Ambulatory Visit

## 2023-09-19 ENCOUNTER — Telehealth: Payer: Self-pay

## 2023-09-19 DIAGNOSIS — M6281 Muscle weakness (generalized): Secondary | ICD-10-CM

## 2023-09-19 DIAGNOSIS — M25612 Stiffness of left shoulder, not elsewhere classified: Secondary | ICD-10-CM

## 2023-09-19 DIAGNOSIS — M25512 Pain in left shoulder: Secondary | ICD-10-CM | POA: Diagnosis not present

## 2023-09-19 NOTE — Telephone Encounter (Signed)
   Pre-operative Risk Assessment    Patient Name: Crystal Jackson  DOB: [birth year] MRN: [id number]   Date of last office visit: 06/18/23 ALEENE PASSE, MD Date of next office visit: NONE   Request for Surgical Clearance    Procedure:  LEFT REVERSE SHOULDER ARTHROPLASTY  Date of Surgery:  Clearance TBD                                Surgeon:  DR ADDIE Socks Group or Practice Name:  [facility] Phone number:  [phone] Fax number:  [phone]   Type of Clearance Requested:   - Medical    Type of Anesthesia:  General    Additional requests/questions:    SignedLucie DELENA Ku   09/19/2023, 4:43 PM

## 2023-09-20 NOTE — Telephone Encounter (Signed)
 Called patient to contact our office to schedule cardiac clearance telehealth visit. NA, left message on VM.

## 2023-09-20 NOTE — Telephone Encounter (Signed)
   Name: Takira Sherrin  DOB: May 26, 1945  MRN: [id number]  Primary Cardiologist: Aleene Passe, MD (Inactive)  Chart reviewed as part of pre-operative protocol coverage. Because of Arya Luttrull past medical history and time since last visit, she will require a follow-up telephone visit in order to better assess preoperative cardiovascular risk.  Pre-op covering staff: - Please schedule appointment and call patient to inform them. If patient already had an upcoming appointment within acceptable timeframe, please add pre-op clearance to the appointment notes so provider is aware. - Please contact requesting surgeon's office via preferred method (i.e, phone, fax) to inform them of need for appointment prior to surgery.  No medications indicated as needing held.   Orren LOISE Fabry, PA-C  09/20/2023, 8:22 AM

## 2023-09-21 NOTE — Therapy (Signed)
 OUTPATIENT PHYSICAL THERAPY SHOULDER TREATMENT   Patient Name: Crystal Jackson MRN: [id number] DOB:[birth year], 78 y.o., female Today's Date: 09/23/2023  END OF SESSION:    PT End of Session - 09/23/23 1315     Visit Number 6    Number of Visits 17    Date for Recertification  10/22/23    PT Start Time 1300    PT Stop Time 1340    PT Time Calculation (min) 40 min    Activity Tolerance Patient tolerated treatment well    Behavior During Therapy WFL for tasks assessed/performed               Past Medical History:  Diagnosis Date   Anxiety    Arthritis    Asthma    Back pain    Bilateral swelling of feet    Constipation    Depression    Diabetes mellitus without complication (HCC)    Gallbladder problem    GERD (gastroesophageal reflux disease)    History of low potassium    Hypertension    Joint pain    Obesity    Palpitations    Shortness of breath    Sleep apnea    Past Surgical History:  Procedure Laterality Date   ABDOMINAL HYSTERECTOMY     APPENDECTOMY     CATARACT EXTRACTION, BILATERAL     CHOLECYSTECTOMY     KNEE SURGERY Left    arthroscopy   SHOULDER SURGERY Right    TOTAL KNEE ARTHROPLASTY Right 09/14/2022   Procedure: RIGHT TOTAL KNEE ARTHROPLASTY;  Surgeon: Vernetta Lonni GRADE, MD;  Location: [facility];  Service: Orthopedics;  Laterality: Right;   Patient Active Problem List   Diagnosis Date Noted   Orthostatic hypotension 06/18/2023   Status post right knee replacement 09/16/2022   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/14/2022   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   BMI 40.0-44.9, adult (HCC)-current bmi 37.3 03/20/2022   Morbid obesity (HCC) 01/30/2022   BMI 37.0-37.9, adult 01/30/2022   B12 deficiency due to diet 09/27/2021   SOB (shortness of breath) on exertion 09/27/2021   Chronic vertigo 09/27/2021   Other hyperlipidemia 08/28/2021   Vitamin D  deficiency 08/28/2021   Pre-diabetes 06/28/2021   Sepsis (HCC) 05/15/2021    Hypoxia 05/15/2021   Essential hypertension 05/15/2021   DM (diabetes mellitus), type 2 (HCC) 05/15/2021   Hypomagnesemia 05/15/2021   UTI (urinary tract infection) 05/14/2021   Acute exacerbation of chronic low back pain 03/10/2021   OSA on CPAP 09/03/2018   Mild persistent asthma without complication 09/03/2018   Class 3 severe obesity with serious comorbidity and body mass index (BMI) of 40.0 to 44.9 in adult 04/14/2018   Unilateral primary osteoarthritis, left knee 04/14/2018   Asthma in adult, mild intermittent, uncomplicated 02/26/2017   Allergic rhinitis 09/05/2013    PCP: Stephane Leita DEL, MD   REFERRING PROVIDER: Addie Cordella Hamilton, MD   REFERRING DIAG: [phone] (ICD-10-CM) - Chronic left shoulder pain M19.012 (ICD-10-CM) - Primary osteoarthritis of left shoulder  Rationale for Evaluation and Treatment: Rehabilitation  THERAPY DIAG:  Acute pain of left shoulder  Stiffness of left shoulder, not elsewhere classified  Muscle weakness (generalized)  ONSET DATE: Fall 2024  SUBJECTIVE:  SUBJECTIVE STATEMENT: Pt reports pain is steady and lots of  crunching.   Hand dominance:  Right PERTINENT HISTORY: R TKA 9/24  PAIN:  Are you having pain?  Yes: NPRS scale: 4/10 Pain location: anterior, posterior and some radiating pain Pain description: achy, sharp Aggravating factors: lifting, reaching Relieving factors: OTC  PRECAUTIONS: None  RED FLAGS: None   WEIGHT BEARING RESTRICTIONS: No  FALLS:  Has patient fallen in last 6 months? No  LIVING ENVIRONMENT: Lives with: lives with their family  OCCUPATION: retired  PLOF: Independent  PATIENT GOALS: 1  NEXT MD VISIT:not scheduled  OBJECTIVE:  Note: Objective measures were completed at Evaluation unless otherwise  noted.  DIAGNOSTIC FINDINGS:  none  PATIENT SURVEYS :  PSFS: THE PATIENT SPECIFIC FUNCTIONAL SCALE  Place score of 0-10 (0 = unable to perform activity and 10 = able to perform activity at the same level as before injury or problem)  Activity Date: 08/27/23    Doing hair 1    2.lifting arm up 3    3.rotating arm 2    4.      Total Score 2      Total Score = Sum of activity scores/number of activities  Minimally Detectable Change: 3 points (for single activity); 2 points (for average score)  [facility] (nd). The Patient Specific Functional Scale . Retrieved from [email or web address]   COGNITION: Overall cognitive status: Within functional limits for tasks assessed     SENSATION: WFL  POSTURE: Slight rounded shoulders  UPPER EXTREMITY ROM:    AROM/PROM Right eval Left eval  Shoulder flexion  95/108  Shoulder extension  WNL  Shoulder abduction  55/118  Shoulder adduction    Shoulder internal rotation  50%  Shoulder external rotation  32/48  Elbow flexion    Elbow extension    Wrist flexion    Wrist extension    Wrist ulnar deviation    Wrist radial deviation    Wrist pronation    Wrist supination    (Blank rows = not tested)  UPPER EXTREMITY MMT:  MMT Right eval Left eval  Shoulder flexion  3+  Shoulder extension    Shoulder abduction  3+  Shoulder adduction    Shoulder internal rotation  4  Shoulder external rotation  3+  Middle trapezius    Lower trapezius    Elbow flexion    Elbow extension    Wrist flexion    Wrist extension    Wrist ulnar deviation    Wrist radial deviation    Wrist pronation    Wrist supination    Grip strength (lbs)    (Blank rows = not tested)  SHOULDER SPECIAL TESTS: Impingement tests: Painful arc test: positive  Rotator cuff assessment: Drop arm test: negative  JOINT MOBILITY TESTING:  Decreased throughout GH joint  PALPATION:  1+ tenderness at  supraspinatus insertion  TREATMENT DATE: 09/23/23 L shoulder Ther Ex: Pulleys 3 min Isometrics with ball in doorway 10x 5 sec hold ER,IR, ext, abd, flex There Act: For reaching, pulling, dressing Supine wand for flexion 10x  Supine chest press 10x 1# Ladder walks 5x Neuro Re ed : for posture Tband rows Red 2x10 Scap squeezes 2x10 Manual  PROM/scap mobs    09/19/23 Ther Ex: Pulleys 3 min Isometrics with ball in doorway 10x 5 sec hold ER,IR, ext, abd, flex There Act: For reaching, pulling, dressing Supine wand for flexion 10x  Supine chest press 10x Ladder walks 5x Neuro Re ed : for posture Tband rows Red 2x10 Scap squeezes 2x10 Manual  PROM/scap mobs    09/16/23 Ther Ex: Pulleys 3 min Isometrics with ball in doorway 10x 5 sec hold ER,IR, ext, abd, flex There Act: For reaching, pulling, dressing Supine wand for flexion 10x  Supine chest press 10x Ladder walks 5x Neuro Re ed : for posture Tband rows Red 2x10 Scap squeezes 2x10 Manual  PROM/scap mobs   09/13/23 L Ther Ex: Pulleys 3 min Isometrics with ball in doorway 10x 5 sec hold ER,IR, ext, abd, flex There Act: For reaching, pulling, dressing Supine wand for flexion 10x  Supine chest press 10x Ladder walks 5x Neuro Re ed : for posture Tband rows Red 2x10 Scap squeezes 2x10 Manual  PROM/scap mobs    09/09/23 Ther Ex: Full HEP review Isometrics with ball in doorway 10x 5 sec hold ER,IR, ext, abd, flex There Act: For reaching, pulling, dressing Supine wand for flexion 5x Supine chest press 5x Ladder walks 10x Neuro Re ed : for posture Tband rows Red 2x10 Scap squeezes 2x10    08/27/23 Evaluation completed followed by trial of HEP.   PATIENT EDUCATION: Education details: HEP Person educated: Patient Education method: Solicitor, Actor cues, and Verbal  cues Education comprehension: verbalized understanding, returned demonstration, and verbal cues required  HOME EXERCISE PROGRAM: Access Code: [email or web address] URL: [email or web address] Date: 09/19/2023 Prepared by: Burnard Meth  Exercises - Seated Scapular Retraction  - 2 x daily - 7 x weekly - 2 sets - 10 reps - Seated Shoulder Shrugs  - 2 x daily - 7 x weekly - 2 sets - 10 reps - Standing Isometric Shoulder Internal Rotation at Doorway  - 2 x daily - 7 x weekly - 2 sets - 10 reps - 3 hold - Isometric Shoulder Flexion at Wall  - 2 x daily - 7 x weekly - 2 sets - 10 reps - 3 hold - Isometric Shoulder Extension at Wall  - 2 x daily - 7 x weekly - 2 sets - 10 reps - 3 hold - Standing Isometric Shoulder External Rotation with Doorway  - 2 x daily - 7 x weekly - 2 sets - 10 reps - 3 hold - Isometric Shoulder Abduction at Wall  - 2 x daily - 7 x weekly - 2 sets - 10 reps - 3 hold   ASSESSMENT:  CLINICAL IMPRESSION: Patient presents with decreased joint mobility with ROM exercises- moderate crepitus.   OBJECTIVE IMPAIRMENTS: decreased activity tolerance, decreased ROM, decreased strength, impaired flexibility, and pain.   ACTIVITY LIMITATIONS: carrying, bathing, dressing, reach over head, and hygiene/grooming  PARTICIPATION LIMITATIONS: meal prep, cleaning, driving, and hairstyling  PERSONAL FACTORS: -none  REHAB POTENTIAL: Good  CLINICAL DECISION MAKING: Stable/uncomplicated  EVALUATION COMPLEXITY: Moderate  GOALS: Goals reviewed with patient? Yes  SHORT TERM GOALS: Target date: 09/25/2023    Pt to be independent with HEP.  Baseline: Goal status: MET 09/19/23  2.  Decrease pain by 1 level. Baseline: up to 10/10 Goal status: ONGOING 09/19/23  LONG TERM GOALS: Target date: 10/22/2023    Pt is independent with a self progressive HEP. Baseline:  Goal status: INITIAL  2.  Decrease pain to max 2/10 with all activities. Baseline:  Goal status: INITIAL  3.   Increase AROM to WFL. Baseline:  Goal status: INITIAL  4.  Increase strength to at least 4+/5 throughout.  Baseline: 3-3+ Goal status: INITIAL  5.  Increase score of PSFS by at least 2 points for detectable change. Baseline: 3 Goal status: INITIAL  PLAN: PT FREQUENCY: 2x/week  PT DURATION: 8 weeks  PLANNED INTERVENTIONS: 97164- PT Re-evaluation, 97110-Therapeutic exercises, 97530- Therapeutic activity, [id number]- Neuromuscular re-education, 97535- Self Care, [id number]- Manual therapy, [id number]- Electrical stimulation (unattended), Patient/Family education, Cryotherapy, and Moist heat  PLAN FOR NEXT SESSION: Continue with treatment possible total shoulder replacement surgery pending.    Burnard Meth, PT 09/23/23  1:17 PM

## 2023-09-23 ENCOUNTER — Telehealth: Payer: Self-pay | Admitting: Orthopedic Surgery

## 2023-09-23 ENCOUNTER — Ambulatory Visit (INDEPENDENT_AMBULATORY_CARE_PROVIDER_SITE_OTHER)

## 2023-09-23 DIAGNOSIS — M25612 Stiffness of left shoulder, not elsewhere classified: Secondary | ICD-10-CM | POA: Diagnosis not present

## 2023-09-23 DIAGNOSIS — M6281 Muscle weakness (generalized): Secondary | ICD-10-CM

## 2023-09-23 DIAGNOSIS — M25512 Pain in left shoulder: Secondary | ICD-10-CM | POA: Diagnosis not present

## 2023-09-23 NOTE — Telephone Encounter (Signed)
 Patient would like to hold off on left reverse shoulder arthroplasty with Dr. Addie.  She says physical therapy seems to be helping for now but understands it will not fix the problem.  She would like to wait on having the scan done at this time and will call when ready to schedule both scan and surgery.  If any questions, patient can be reached at [phone].

## 2023-09-24 DIAGNOSIS — G4733 Obstructive sleep apnea (adult) (pediatric): Secondary | ICD-10-CM | POA: Diagnosis not present

## 2023-09-24 NOTE — Therapy (Signed)
 OUTPATIENT PHYSICAL THERAPY SHOULDER TREATMENT   Patient Name: Crystal Jackson MRN: [id number] DOB:12-26-1945, 78 y.o., female Today's Date: 09/25/2023  END OF SESSION:    PT End of Session - 09/25/23 1340     Visit Number 7    Number of Visits 17    Date for Recertification  10/22/23    PT Start Time 1300    PT Stop Time 1338    PT Time Calculation (min) 38 min    Activity Tolerance Patient tolerated treatment well    Behavior During Therapy WFL for tasks assessed/performed                Past Medical History:  Diagnosis Date   Anxiety    Arthritis    Asthma    Back pain    Bilateral swelling of feet    Constipation    Depression    Diabetes mellitus without complication (HCC)    Gallbladder problem    GERD (gastroesophageal reflux disease)    History of low potassium    Hypertension    Joint pain    Obesity    Palpitations    Shortness of breath    Sleep apnea    Past Surgical History:  Procedure Laterality Date   ABDOMINAL HYSTERECTOMY     APPENDECTOMY     CATARACT EXTRACTION, BILATERAL     CHOLECYSTECTOMY     KNEE SURGERY Left    arthroscopy   SHOULDER SURGERY Right    TOTAL KNEE ARTHROPLASTY Right 09/14/2022   Procedure: RIGHT TOTAL KNEE ARTHROPLASTY;  Surgeon: Vernetta Lonni GRADE, MD;  Location: [facility];  Service: Orthopedics;  Laterality: Right;   Patient Active Problem List   Diagnosis Date Noted   Orthostatic hypotension 06/18/2023   Status post right knee replacement 09/16/2022   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/14/2022   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   BMI 40.0-44.9, adult (HCC)-current bmi 37.3 03/20/2022   Morbid obesity (HCC) 01/30/2022   BMI 37.0-37.9, adult 01/30/2022   B12 deficiency due to diet 09/27/2021   SOB (shortness of breath) on exertion 09/27/2021   Chronic vertigo 09/27/2021   Other hyperlipidemia 08/28/2021   Vitamin D  deficiency 08/28/2021   Pre-diabetes 06/28/2021   Sepsis (HCC) 05/15/2021    Hypoxia 05/15/2021   Essential hypertension 05/15/2021   DM (diabetes mellitus), type 2 (HCC) 05/15/2021   Hypomagnesemia 05/15/2021   UTI (urinary tract infection) 05/14/2021   Acute exacerbation of chronic low back pain 03/10/2021   OSA on CPAP 09/03/2018   Mild persistent asthma without complication 09/03/2018   Class 3 severe obesity with serious comorbidity and body mass index (BMI) of 40.0 to 44.9 in adult 04/14/2018   Unilateral primary osteoarthritis, left knee 04/14/2018   Asthma in adult, mild intermittent, uncomplicated 02/26/2017   Allergic rhinitis 09/05/2013    PCP: Stephane Leita DEL, MD   REFERRING PROVIDER: Vernetta Lonni GRADE*   REFERRING DIAG: [phone] (ICD-10-CM) - Chronic left shoulder pain M19.012 (ICD-10-CM) - Primary osteoarthritis of left shoulder  Rationale for Evaluation and Treatment: Rehabilitation  THERAPY DIAG:  Acute pain of left shoulder  Stiffness of left shoulder, not elsewhere classified  Muscle weakness (generalized)  ONSET DATE: Fall 2024  SUBJECTIVE:  SUBJECTIVE STATEMENT: Pt reports she has increased pain in shoulder and knee today, affecting reaching and walking.   Hand dominance:  Right PERTINENT HISTORY: R TKA 9/24  PAIN:  Are you having pain?  Yes: NPRS scale: 5/10 Pain location: anterior, posterior and some radiating pain Pain description: achy, sharp Aggravating factors: lifting, reaching Relieving factors: OTC  PRECAUTIONS: None  RED FLAGS: None   WEIGHT BEARING RESTRICTIONS: No  FALLS:  Has patient fallen in last 6 months? No  LIVING ENVIRONMENT: Lives with: lives with their family  OCCUPATION: retired  PLOF: Independent  PATIENT GOALS: 1  NEXT MD VISIT:not scheduled  OBJECTIVE:  Note: Objective measures were  completed at Evaluation unless otherwise noted.  DIAGNOSTIC FINDINGS:  none  PATIENT SURVEYS :  PSFS: THE PATIENT SPECIFIC FUNCTIONAL SCALE  Place score of 0-10 (0 = unable to perform activity and 10 = able to perform activity at the same level as before injury or problem)  Activity Date: 08/27/23    Doing hair 1    2.lifting arm up 3    3.rotating arm 2    4.      Total Score 2      Total Score = Sum of activity scores/number of activities  Minimally Detectable Change: 3 points (for single activity); 2 points (for average score)  [facility] (nd). The Patient Specific Functional Scale . Retrieved from [email or web address]   COGNITION: Overall cognitive status: Within functional limits for tasks assessed     SENSATION: WFL  POSTURE: Slight rounded shoulders  UPPER EXTREMITY ROM:    AROM/PROM Right eval Left eval  Shoulder flexion  95/108  Shoulder extension  WNL  Shoulder abduction  55/118  Shoulder adduction    Shoulder internal rotation  50%  Shoulder external rotation  32/48  Elbow flexion    Elbow extension    Wrist flexion    Wrist extension    Wrist ulnar deviation    Wrist radial deviation    Wrist pronation    Wrist supination    (Blank rows = not tested)  UPPER EXTREMITY MMT:  MMT Right eval Left eval  Shoulder flexion  3+  Shoulder extension    Shoulder abduction  3+  Shoulder adduction    Shoulder internal rotation  4  Shoulder external rotation  3+  Middle trapezius    Lower trapezius    Elbow flexion    Elbow extension    Wrist flexion    Wrist extension    Wrist ulnar deviation    Wrist radial deviation    Wrist pronation    Wrist supination    Grip strength (lbs)    (Blank rows = not tested)  SHOULDER SPECIAL TESTS: Impingement tests: Painful arc test: positive  Rotator cuff assessment: Drop arm test: negative  JOINT MOBILITY TESTING:  Decreased throughout  GH joint  PALPATION:  1+ tenderness at supraspinatus insertion  TREATMENT DATE: 09/25/23 Ther Ex: Pulleys 3 min Isometrics with ball in doorway 10x 5 sec hold ER,IR, ext, abd, flex There Act: For reaching, pulling, dressing Supine wand for flexion 5x Supine chest press 10x 1# Ladder walks 5x Neuro Re ed : for posture Tband rows Red 2x10 Scap squeezes 2x10 Manual  PROM/scap mobs    09/23/23 L shoulder Ther Ex: Pulleys 3 min Isometrics with ball in doorway 10x 5 sec hold ER,IR, ext, abd, flex There Act: For reaching, pulling, dressing Supine wand for flexion 10x  Supine chest press 10x 1# Ladder walks 5x Neuro Re ed : for posture Tband rows Red 2x10 Scap squeezes 2x10 Manual  PROM/scap mobs    09/19/23 Ther Ex: Pulleys 3 min Isometrics with ball in doorway 10x 5 sec hold ER,IR, ext, abd, flex There Act: For reaching, pulling, dressing Supine wand for flexion 10x  Supine chest press 10x Ladder walks 5x Neuro Re ed : for posture Tband rows Red 2x10 Scap squeezes 2x10 Manual  PROM/scap mobs    09/16/23 Ther Ex: Pulleys 3 min Isometrics with ball in doorway 10x 5 sec hold ER,IR, ext, abd, flex There Act: For reaching, pulling, dressing Supine wand for flexion 10x  Supine chest press 10x Ladder walks 5x Neuro Re ed : for posture Tband rows Red 2x10 Scap squeezes 2x10 Manual  PROM/scap mobs   09/13/23 L Ther Ex: Pulleys 3 min Isometrics with ball in doorway 10x 5 sec hold ER,IR, ext, abd, flex There Act: For reaching, pulling, dressing Supine wand for flexion 10x  Supine chest press 10x Ladder walks 5x Neuro Re ed : for posture Tband rows Red 2x10 Scap squeezes 2x10 Manual  PROM/scap mobs    09/09/23 Ther Ex: Full HEP review Isometrics with ball in doorway 10x 5 sec hold ER,IR, ext, abd, flex There Act: For reaching,  pulling, dressing Supine wand for flexion 5x Supine chest press 5x Ladder walks 10x Neuro Re ed : for posture Tband rows Red 2x10 Scap squeezes 2x10    08/27/23 Evaluation completed followed by trial of HEP.   PATIENT EDUCATION: Education details: HEP Person educated: Patient Education method: Solicitor, Actor cues, and Verbal cues Education comprehension: verbalized understanding, returned demonstration, and verbal cues required  HOME EXERCISE PROGRAM: Access Code: [email or web address] URL: [email or web address] Date: 09/19/2023 Prepared by: Burnard Meth  Exercises - Seated Scapular Retraction  - 2 x daily - 7 x weekly - 2 sets - 10 reps - Seated Shoulder Shrugs  - 2 x daily - 7 x weekly - 2 sets - 10 reps - Standing Isometric Shoulder Internal Rotation at Doorway  - 2 x daily - 7 x weekly - 2 sets - 10 reps - 3 hold - Isometric Shoulder Flexion at Wall  - 2 x daily - 7 x weekly - 2 sets - 10 reps - 3 hold - Isometric Shoulder Extension at Wall  - 2 x daily - 7 x weekly - 2 sets - 10 reps - 3 hold - Standing Isometric Shoulder External Rotation with Doorway  - 2 x daily - 7 x weekly - 2 sets - 10 reps - 3 hold - Isometric Shoulder Abduction at Wall  - 2 x daily - 7 x weekly - 2 sets - 10 reps - 3 hold   ASSESSMENT:  CLINICAL IMPRESSION: Patient with difficulty with wand flexion due to increased pain.  Adjusted to less reps. OBJECTIVE IMPAIRMENTS: decreased activity tolerance, decreased ROM, decreased strength, impaired flexibility, and pain.  ACTIVITY LIMITATIONS: carrying, bathing, dressing, reach over head, and hygiene/grooming  PARTICIPATION LIMITATIONS: meal prep, cleaning, driving, and hairstyling  PERSONAL FACTORS: -none  REHAB POTENTIAL: Good  CLINICAL DECISION MAKING: Stable/uncomplicated  EVALUATION COMPLEXITY: Moderate  GOALS: Goals reviewed with patient? Yes  SHORT TERM GOALS: Target date: 09/25/2023    Pt to be independent  with HEP. Baseline: Goal status: MET 09/19/23  2.  Decrease pain by 1 level. Baseline: up to 10/10 Goal status: ONGOING 09/19/23  LONG TERM GOALS: Target date: 10/22/2023    Pt is independent with a self progressive HEP. Baseline:  Goal status: INITIAL  2.  Decrease pain to max 2/10 with all activities. Baseline:  Goal status: INITIAL  3.  Increase AROM to WFL. Baseline:  Goal status: INITIAL  4.  Increase strength to at least 4+/5 throughout.  Baseline: 3-3+ Goal status: INITIAL  5.  Increase score of PSFS by at least 2 points for detectable change. Baseline: 3 Goal status: INITIAL  PLAN: PT FREQUENCY: 2x/week  PT DURATION: 8 weeks  PLANNED INTERVENTIONS: 97164- PT Re-evaluation, 97110-Therapeutic exercises, 97530- Therapeutic activity, [id number]- Neuromuscular re-education, 97535- Self Care, [id number]- Manual therapy, [id number]- Electrical stimulation (unattended), Patient/Family education, Cryotherapy, and Moist heat  PLAN FOR NEXT SESSION: Continue with treatment to prepare for TSA which will be in a few months when her schedule clears up.   Burnard Meth, PT 09/25/23  1:42 PM

## 2023-09-24 NOTE — Telephone Encounter (Signed)
 thx

## 2023-09-24 NOTE — Telephone Encounter (Signed)
 2ND attempt to reach pt regarding surgical clearance and the need for an TELE appointment.  Left pt a detailed message to call back and get that scheduled.

## 2023-09-24 NOTE — Telephone Encounter (Signed)
 Order has been cancelled.

## 2023-09-25 ENCOUNTER — Ambulatory Visit

## 2023-09-25 DIAGNOSIS — M25612 Stiffness of left shoulder, not elsewhere classified: Secondary | ICD-10-CM | POA: Diagnosis not present

## 2023-09-25 DIAGNOSIS — M25512 Pain in left shoulder: Secondary | ICD-10-CM | POA: Diagnosis not present

## 2023-09-25 DIAGNOSIS — M6281 Muscle weakness (generalized): Secondary | ICD-10-CM

## 2023-09-25 NOTE — Telephone Encounter (Signed)
 Patient has postponed procedure will contact office when procedure has a new date scheduled will remove from our pool till further notice

## 2023-09-25 NOTE — Telephone Encounter (Signed)
 Pt called stating that she has had to put procedure on hold for now. Pt will c/b to schedule appt once she reschedules procedure. Please advice

## 2023-09-29 NOTE — Therapy (Signed)
 OUTPATIENT PHYSICAL THERAPY SHOULDER TREATMENT   Patient Name: Crystal Jackson MRN: [id number] DOB:09-30-1945, 78 y.o., female Today's Date: 09/30/2023  END OF SESSION:    PT End of Session - 09/30/23 1340     Visit Number 8    Number of Visits 17    Date for Recertification  10/22/23    PT Start Time 1300    PT Stop Time 1338    PT Time Calculation (min) 38 min    Activity Tolerance Patient tolerated treatment well    Behavior During Therapy WFL for tasks assessed/performed                 Past Medical History:  Diagnosis Date   Anxiety    Arthritis    Asthma    Back pain    Bilateral swelling of feet    Constipation    Depression    Diabetes mellitus without complication (HCC)    Gallbladder problem    GERD (gastroesophageal reflux disease)    History of low potassium    Hypertension    Joint pain    Obesity    Palpitations    Shortness of breath    Sleep apnea    Past Surgical History:  Procedure Laterality Date   ABDOMINAL HYSTERECTOMY     APPENDECTOMY     CATARACT EXTRACTION, BILATERAL     CHOLECYSTECTOMY     KNEE SURGERY Left    arthroscopy   SHOULDER SURGERY Right    TOTAL KNEE ARTHROPLASTY Right 09/14/2022   Procedure: RIGHT TOTAL KNEE ARTHROPLASTY;  Surgeon: Vernetta Lonni GRADE, MD;  Location: [facility];  Service: Orthopedics;  Laterality: Right;   Patient Active Problem List   Diagnosis Date Noted   Orthostatic hypotension 06/18/2023   Status post right knee replacement 09/16/2022   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/14/2022   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   BMI 40.0-44.9, adult (HCC)-current bmi 37.3 03/20/2022   Morbid obesity (HCC) 01/30/2022   BMI 37.0-37.9, adult 01/30/2022   B12 deficiency due to diet 09/27/2021   SOB (shortness of breath) on exertion 09/27/2021   Chronic vertigo 09/27/2021   Other hyperlipidemia 08/28/2021   Vitamin D  deficiency 08/28/2021   Pre-diabetes 06/28/2021   Sepsis (HCC)  05/15/2021   Hypoxia 05/15/2021   Essential hypertension 05/15/2021   DM (diabetes mellitus), type 2 (HCC) 05/15/2021   Hypomagnesemia 05/15/2021   UTI (urinary tract infection) 05/14/2021   Acute exacerbation of chronic low back pain 03/10/2021   OSA on CPAP 09/03/2018   Mild persistent asthma without complication 09/03/2018   Class 3 severe obesity with serious comorbidity and body mass index (BMI) of 40.0 to 44.9 in adult 04/14/2018   Unilateral primary osteoarthritis, left knee 04/14/2018   Asthma in adult, mild intermittent, uncomplicated 02/26/2017   Allergic rhinitis 09/05/2013    PCP: Stephane Leita DEL, MD   REFERRING PROVIDER: Vernetta Lonni GRADE*   REFERRING DIAG: [phone] (ICD-10-CM) - Chronic left shoulder pain M19.012 (ICD-10-CM) - Primary osteoarthritis of left shoulder  Rationale for Evaluation and Treatment: Rehabilitation  THERAPY DIAG:  Acute pain of left shoulder  Stiffness of left shoulder, not elsewhere classified  Muscle weakness (generalized)  Chronic pain of right knee  ONSET DATE: Fall 2024  SUBJECTIVE:  SUBJECTIVE STATEMENT: Pt states shoulder feels stiff. Hand dominance:  Right PERTINENT HISTORY: R TKA 9/24  PAIN:  Are you having pain?  Yes: NPRS scale: 5/10 Pain location: anterior, posterior and some radiating pain Pain description: achy, sharp Aggravating factors: lifting, reaching Relieving factors: OTC  PRECAUTIONS: None  RED FLAGS: None   WEIGHT BEARING RESTRICTIONS: No  FALLS:  Has patient fallen in last 6 months? No  LIVING ENVIRONMENT: Lives with: lives with their family  OCCUPATION: retired  PLOF: Independent  PATIENT GOALS: 1  NEXT MD VISIT:not scheduled  OBJECTIVE:  Note: Objective measures were completed at Evaluation  unless otherwise noted.  DIAGNOSTIC FINDINGS:  none  PATIENT SURVEYS :  PSFS: THE PATIENT SPECIFIC FUNCTIONAL SCALE  Place score of 0-10 (0 = unable to perform activity and 10 = able to perform activity at the same level as before injury or problem)  Activity Date: 08/27/23    Doing hair 1    2.lifting arm up 3    3.rotating arm 2    4.      Total Score 2      Total Score = Sum of activity scores/number of activities  Minimally Detectable Change: 3 points (for single activity); 2 points (for average score)  [facility] (nd). The Patient Specific Functional Scale . Retrieved from [email or web address]   COGNITION: Overall cognitive status: Within functional limits for tasks assessed     SENSATION: WFL  POSTURE: Slight rounded shoulders  UPPER EXTREMITY ROM:    AROM/PROM Right eval Left eval  Shoulder flexion  95/108  Shoulder extension  WNL  Shoulder abduction  55/118  Shoulder adduction    Shoulder internal rotation  50%  Shoulder external rotation  32/48  Elbow flexion    Elbow extension    Wrist flexion    Wrist extension    Wrist ulnar deviation    Wrist radial deviation    Wrist pronation    Wrist supination    (Blank rows = not tested)  UPPER EXTREMITY MMT:  MMT Right eval Left eval  Shoulder flexion  3+  Shoulder extension    Shoulder abduction  3+  Shoulder adduction    Shoulder internal rotation  4  Shoulder external rotation  3+  Middle trapezius    Lower trapezius    Elbow flexion    Elbow extension    Wrist flexion    Wrist extension    Wrist ulnar deviation    Wrist radial deviation    Wrist pronation    Wrist supination    Grip strength (lbs)    (Blank rows = not tested)  SHOULDER SPECIAL TESTS: Impingement tests: Painful arc test: positive  Rotator cuff assessment: Drop arm test: negative  JOINT MOBILITY TESTING:  Decreased throughout GH joint  PALPATION:   1+ tenderness at supraspinatus insertion  TREATMENT DATE: L Shoulder 09/30/23 Ther Ex: Pulleys 3 min Isometrics with ball in doorway 10x 5 sec hold ER,IR, ext, abd, flex There Act: For reaching, pulling, dressing Supine wand for flexion 8x Supine chest press 10x 1# Ladder walks 5x Supine punches 2x5 1# Neuro Re ed : for posture Tband rows Red 2x10 Scap squeezes 2x10 Manual  PROM/scap mobs    09/25/23 Ther Ex: Pulleys 3 min Isometrics with ball in doorway 10x 5 sec hold ER,IR, ext, abd, flex There Act: For reaching, pulling, dressing Supine wand for flexion 5x Supine chest press 10x 1# Ladder walks 5x Neuro Re ed : for posture Tband rows Red 2x10 Scap squeezes 2x10 Manual  PROM/scap mobs    09/23/23 L shoulder Ther Ex: Pulleys 3 min Isometrics with ball in doorway 10x 5 sec hold ER,IR, ext, abd, flex There Act: For reaching, pulling, dressing Supine wand for flexion 10x  Supine chest press 10x 1# Ladder walks 5x Neuro Re ed : for posture Tband rows Red 2x10 Scap squeezes 2x10 Manual  PROM/scap mobs    09/19/23 Ther Ex: Pulleys 3 min Isometrics with ball in doorway 10x 5 sec hold ER,IR, ext, abd, flex There Act: For reaching, pulling, dressing Supine wand for flexion 10x  Supine chest press 10x Ladder walks 5x Neuro Re ed : for posture Tband rows Red 2x10 Scap squeezes 2x10 Manual  PROM/scap mobs    09/16/23 Ther Ex: Pulleys 3 min Isometrics with ball in doorway 10x 5 sec hold ER,IR, ext, abd, flex There Act: For reaching, pulling, dressing Supine wand for flexion 10x  Supine chest press 10x Ladder walks 5x Neuro Re ed : for posture Tband rows Red 2x10 Scap squeezes 2x10 Manual  PROM/scap mobs   09/13/23 L Ther Ex: Pulleys 3 min Isometrics with ball in doorway 10x 5 sec hold ER,IR, ext, abd, flex There Act: For  reaching, pulling, dressing Supine wand for flexion 10x  Supine chest press 10x Ladder walks 5x Neuro Re ed : for posture Tband rows Red 2x10 Scap squeezes 2x10 Manual  PROM/scap mobs    09/09/23 Ther Ex: Full HEP review Isometrics with ball in doorway 10x 5 sec hold ER,IR, ext, abd, flex There Act: For reaching, pulling, dressing Supine wand for flexion 5x Supine chest press 5x Ladder walks 10x Neuro Re ed : for posture Tband rows Red 2x10 Scap squeezes 2x10    08/27/23 Evaluation completed followed by trial of HEP.   PATIENT EDUCATION: Education details: HEP Person educated: Patient Education method: Solicitor, Actor cues, and Verbal cues Education comprehension: verbalized understanding, returned demonstration, and verbal cues required  HOME EXERCISE PROGRAM: Access Code: [email or web address] URL: [email or web address] Date: 09/19/2023 Prepared by: Burnard Meth  Exercises - Seated Scapular Retraction  - 2 x daily - 7 x weekly - 2 sets - 10 reps - Seated Shoulder Shrugs  - 2 x daily - 7 x weekly - 2 sets - 10 reps - Standing Isometric Shoulder Internal Rotation at Doorway  - 2 x daily - 7 x weekly - 2 sets - 10 reps - 3 hold - Isometric Shoulder Flexion at Wall  - 2 x daily - 7 x weekly - 2 sets - 10 reps - 3 hold - Isometric Shoulder Extension at Wall  - 2 x daily - 7 x weekly - 2 sets - 10 reps - 3 hold - Standing Isometric Shoulder External Rotation with Doorway  - 2 x daily - 7 x weekly - 2 sets -  10 reps - 3 hold - Isometric Shoulder Abduction at Wall  - 2 x daily - 7 x weekly - 2 sets - 10 reps - 3 hold   ASSESSMENT:  CLINICAL IMPRESSION: Patient improved ability for wand flexion today as demo by [employer].  OBJECTIVE IMPAIRMENTS: decreased activity tolerance, decreased ROM, decreased strength, impaired flexibility, and pain.   ACTIVITY LIMITATIONS: carrying, bathing, dressing, reach over head, and hygiene/grooming  PARTICIPATION  LIMITATIONS: meal prep, cleaning, driving, and hairstyling  PERSONAL FACTORS: -none  REHAB POTENTIAL: Good  CLINICAL DECISION MAKING: Stable/uncomplicated  EVALUATION COMPLEXITY: Moderate  GOALS: Goals reviewed with patient? Yes  SHORT TERM GOALS: Target date: 09/25/2023    Pt to be independent with HEP. Baseline: Goal status: MET 09/19/23  2.  Decrease pain by 1 level. Baseline: up to 10/10 Goal status: MET 09/30/23  LONG TERM GOALS: Target date: 10/22/2023    Pt is independent with a self progressive HEP. Baseline:  Goal status: INITIAL  2.  Decrease pain to max 2/10 with all activities. Baseline:  Goal status: INITIAL  3.  Increase AROM to WFL. Baseline:  Goal status: INITIAL  4.  Increase strength to at least 4+/5 throughout.  Baseline: 3-3+ Goal status: INITIAL  5.  Increase score of PSFS by at least 2 points for detectable change. Baseline: 3 Goal status: INITIAL  PLAN: PT FREQUENCY: 2x/week  PT DURATION: 8 weeks  PLANNED INTERVENTIONS: 97164- PT Re-evaluation, 97110-Therapeutic exercises, 97530- Therapeutic activity, [id number]- Neuromuscular re-education, 97535- Self Care, [id number]- Manual therapy, [id number]- Electrical stimulation (unattended), Patient/Family education, Cryotherapy, and Moist heat  PLAN FOR NEXT SESSION: Continue with treatment to prepare for TSA which will be in a few months when her schedule clears up.   Burnard Meth, PT 09/30/23  1:41 PM

## 2023-09-30 ENCOUNTER — Ambulatory Visit

## 2023-09-30 DIAGNOSIS — M25612 Stiffness of left shoulder, not elsewhere classified: Secondary | ICD-10-CM

## 2023-09-30 DIAGNOSIS — M25512 Pain in left shoulder: Secondary | ICD-10-CM | POA: Diagnosis not present

## 2023-09-30 DIAGNOSIS — G8929 Other chronic pain: Secondary | ICD-10-CM | POA: Diagnosis not present

## 2023-09-30 DIAGNOSIS — M25561 Pain in right knee: Secondary | ICD-10-CM

## 2023-09-30 DIAGNOSIS — M6281 Muscle weakness (generalized): Secondary | ICD-10-CM

## 2023-10-01 NOTE — Therapy (Signed)
 OUTPATIENT PHYSICAL THERAPY SHOULDER TREATMENT   Patient Name: Crystal Jackson MRN: [id number] DOB:02/02/1945, 78 y.o., female Today's Date: 10/01/2023  END OF SESSION:***             Past Medical History:  Diagnosis Date   Anxiety    Arthritis    Asthma    Back pain    Bilateral swelling of feet    Constipation    Depression    Diabetes mellitus without complication (HCC)    Gallbladder problem    GERD (gastroesophageal reflux disease)    History of low potassium    Hypertension    Joint pain    Obesity    Palpitations    Shortness of breath    Sleep apnea    Past Surgical History:  Procedure Laterality Date   ABDOMINAL HYSTERECTOMY     APPENDECTOMY     CATARACT EXTRACTION, BILATERAL     CHOLECYSTECTOMY     KNEE SURGERY Left    arthroscopy   SHOULDER SURGERY Right    TOTAL KNEE ARTHROPLASTY Right 09/14/2022   Procedure: RIGHT TOTAL KNEE ARTHROPLASTY;  Surgeon: Vernetta Lonni GRADE, MD;  Location: [facility];  Service: Orthopedics;  Laterality: Right;   Patient Active Problem List   Diagnosis Date Noted   Orthostatic hypotension 06/18/2023   Status post right knee replacement 09/16/2022   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/14/2022   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   BMI 40.0-44.9, adult (HCC)-current bmi 37.3 03/20/2022   Morbid obesity (HCC) 01/30/2022   BMI 37.0-37.9, adult 01/30/2022   B12 deficiency due to diet 09/27/2021   SOB (shortness of breath) on exertion 09/27/2021   Chronic vertigo 09/27/2021   Other hyperlipidemia 08/28/2021   Vitamin D  deficiency 08/28/2021   Pre-diabetes 06/28/2021   Sepsis (HCC) 05/15/2021   Hypoxia 05/15/2021   Essential hypertension 05/15/2021   DM (diabetes mellitus), type 2 (HCC) 05/15/2021   Hypomagnesemia 05/15/2021   UTI (urinary tract infection) 05/14/2021   Acute exacerbation of chronic low back pain 03/10/2021   OSA on CPAP 09/03/2018   Mild persistent asthma without complication  09/03/2018   Class 3 severe obesity with serious comorbidity and body mass index (BMI) of 40.0 to 44.9 in adult 04/14/2018   Unilateral primary osteoarthritis, left knee 04/14/2018   Asthma in adult, mild intermittent, uncomplicated 02/26/2017   Allergic rhinitis 09/05/2013    PCP: Stephane Leita DEL, MD   REFERRING PROVIDER: Stephane Leita DEL, MD   REFERRING DIAG: [phone] (ICD-10-CM) - Chronic left shoulder pain M19.012 (ICD-10-CM) - Primary osteoarthritis of left shoulder  Rationale for Evaluation and Treatment: Rehabilitation  THERAPY DIAG:  No diagnosis found.  ONSET DATE: Fall 2024  SUBJECTIVE:  SUBJECTIVE STATEMENT: ***Pt states shoulder feels stiff. Hand dominance:  Right PERTINENT HISTORY: R TKA 9/24  PAIN:  ***Are you having pain?  Yes: NPRS scale: 5/10 Pain location: anterior, posterior and some radiating pain Pain description: achy, sharp Aggravating factors: lifting, reaching Relieving factors: OTC  PRECAUTIONS: None  RED FLAGS: None   WEIGHT BEARING RESTRICTIONS: No  FALLS:  Has patient fallen in last 6 months? No  LIVING ENVIRONMENT: Lives with: lives with their family  OCCUPATION: retired  PLOF: Independent  PATIENT GOALS: 1  NEXT MD VISIT:not scheduled  OBJECTIVE:  Note: Objective measures were completed at Evaluation unless otherwise noted.  DIAGNOSTIC FINDINGS:  none  PATIENT SURVEYS :  PSFS: THE PATIENT SPECIFIC FUNCTIONAL SCALE  Place score of 0-10 (0 = unable to perform activity and 10 = able to perform activity at the same level as before injury or problem)  Activity Date: 08/27/23    Doing hair 1    2.lifting arm up 3    3.rotating arm 2    4.      Total Score 2      Total Score = Sum of activity scores/number of activities  Minimally  Detectable Change: 3 points (for single activity); 2 points (for average score)  [facility] (nd). The Patient Specific Functional Scale . Retrieved from [email or web address]   COGNITION: Overall cognitive status: Within functional limits for tasks assessed     SENSATION: WFL  POSTURE: Slight rounded shoulders  UPPER EXTREMITY ROM:    AROM/PROM Right eval Left eval  Shoulder flexion  95/108  Shoulder extension  WNL  Shoulder abduction  55/118  Shoulder adduction    Shoulder internal rotation  50%  Shoulder external rotation  32/48  Elbow flexion    Elbow extension    Wrist flexion    Wrist extension    Wrist ulnar deviation    Wrist radial deviation    Wrist pronation    Wrist supination    (Blank rows = not tested)  UPPER EXTREMITY MMT:  MMT Right eval Left eval  Shoulder flexion  3+  Shoulder extension    Shoulder abduction  3+  Shoulder adduction    Shoulder internal rotation  4  Shoulder external rotation  3+  Middle trapezius    Lower trapezius    Elbow flexion    Elbow extension    Wrist flexion    Wrist extension    Wrist ulnar deviation    Wrist radial deviation    Wrist pronation    Wrist supination    Grip strength (lbs)    (Blank rows = not tested)  SHOULDER SPECIAL TESTS: Impingement tests: Painful arc test: positive  Rotator cuff assessment: Drop arm test: negative  JOINT MOBILITY TESTING:  Decreased throughout GH joint  PALPATION:  1+ tenderness at supraspinatus insertion  TREATMENT DATE: L Shoulder 10/02/23***    09/30/23 Ther Ex: Pulleys 3 min Isometrics with ball in doorway 10x 5 sec hold ER,IR, ext, abd, flex There Act: For reaching, pulling, dressing Supine wand for flexion 8x Supine chest press 10x 1# Ladder walks 5x Supine punches  2x5 1# Neuro Re ed : for posture Tband rows Red 2x10 Scap squeezes 2x10 Manual  PROM/scap mobs  09/25/23 Ther Ex: Pulleys 3 min Isometrics with ball in doorway 10x 5 sec hold ER,IR, ext, abd, flex There Act: For reaching, pulling, dressing Supine wand for flexion 5x Supine chest press 10x 1# Ladder walks 5x Neuro Re ed : for posture Tband rows Red 2x10 Scap squeezes 2x10 Manual  PROM/scap mobs  09/23/23 L shoulder Ther Ex: Pulleys 3 min Isometrics with ball in doorway 10x 5 sec hold ER,IR, ext, abd, flex There Act: For reaching, pulling, dressing Supine wand for flexion 10x  Supine chest press 10x 1# Ladder walks 5x Neuro Re ed : for posture Tband rows Red 2x10 Scap squeezes 2x10 Manual  PROM/scap mobs  08/27/23 Evaluation completed followed by trial of HEP.   PATIENT EDUCATION: Education details: HEP Person educated: Patient Education method: Solicitor, Actor cues, and Verbal cues Education comprehension: verbalized understanding, returned demonstration, and verbal cues required  HOME EXERCISE PROGRAM: Access Code: [email or web address] URL: [email or web address] Date: 09/19/2023 Prepared by: Burnard Meth  Exercises - Seated Scapular Retraction  - 2 x daily - 7 x weekly - 2 sets - 10 reps - Seated Shoulder Shrugs  - 2 x daily - 7 x weekly - 2 sets - 10 reps - Standing Isometric Shoulder Internal Rotation at Doorway  - 2 x daily - 7 x weekly - 2 sets - 10 reps - 3 hold - Isometric Shoulder Flexion at Wall  - 2 x daily - 7 x weekly - 2 sets - 10 reps - 3 hold - Isometric Shoulder Extension at Wall  - 2 x daily - 7 x weekly - 2 sets - 10 reps - 3 hold - Standing Isometric Shoulder External Rotation with Doorway  - 2 x daily - 7 x weekly - 2 sets - 10 reps - 3 hold - Isometric Shoulder Abduction at Wall  - 2 x daily - 7 x weekly - 2 sets - 10 reps - 3 hold   ASSESSMENT:  CLINICAL IMPRESSION: ***Patient improved ability for wand flexion today  as demo by [employer].  OBJECTIVE IMPAIRMENTS: decreased activity tolerance, decreased ROM, decreased strength, impaired flexibility, and pain.   ACTIVITY LIMITATIONS: carrying, bathing, dressing, reach over head, and hygiene/grooming  PARTICIPATION LIMITATIONS: meal prep, cleaning, driving, and hairstyling  PERSONAL FACTORS: -none  REHAB POTENTIAL: Good  CLINICAL DECISION MAKING: Stable/uncomplicated  EVALUATION COMPLEXITY: Moderate  GOALS: Goals reviewed with patient? Yes  SHORT TERM GOALS: Target date: 09/25/2023    Pt to be independent with HEP. Baseline: Goal status: MET 09/19/23  2.  Decrease pain by 1 level. Baseline: up to 10/10 Goal status: MET 09/30/23  LONG TERM GOALS: Target date: 10/22/2023    Pt is independent with a self progressive HEP. Baseline:  Goal status: INITIAL  2.  Decrease pain to max 2/10 with all activities. Baseline:  Goal status: INITIAL  3.  Increase AROM to WFL. Baseline:  Goal status: INITIAL  4.  Increase strength to at least 4+/5 throughout.  Baseline: 3-3+ Goal status: INITIAL  5.  Increase score of PSFS by at least 2 points for detectable change. Baseline: 3 Goal  status: INITIAL  PLAN: PT FREQUENCY: 2x/week  PT DURATION: 8 weeks  PLANNED INTERVENTIONS: 97164- PT Re-evaluation, 97110-Therapeutic exercises, 97530- Therapeutic activity, [id number]- Neuromuscular re-education, 97535- Self Care, [id number]- Manual therapy, G0283- Electrical stimulation (unattended), Patient/Family education, Cryotherapy, and Moist heat  PLAN FOR NEXT SESSION: ***Continue with treatment to prepare for TSA which will be in a few months when her schedule clears up.   Burnard Meth, PT 10/01/23  8:23 AM

## 2023-10-02 ENCOUNTER — Ambulatory Visit

## 2023-10-02 DIAGNOSIS — M25512 Pain in left shoulder: Secondary | ICD-10-CM | POA: Diagnosis not present

## 2023-10-02 DIAGNOSIS — M6281 Muscle weakness (generalized): Secondary | ICD-10-CM

## 2023-10-02 DIAGNOSIS — M25612 Stiffness of left shoulder, not elsewhere classified: Secondary | ICD-10-CM

## 2023-10-06 NOTE — Therapy (Signed)
 OUTPATIENT PHYSICAL THERAPY SHOULDER TREATMENT   Patient Name: Crystal Jackson MRN: [id number] DOB:Jun 06, 1945, 78 y.o., female Today's Date: 10/07/2023  END OF SESSION:    PT End of Session - 10/07/23 1659     Visit Number 10    Number of Visits 17    Date for Recertification  10/22/23    PT Start Time 1300    PT Stop Time 1338    PT Time Calculation (min) 38 min    Activity Tolerance Patient tolerated treatment well    Behavior During Therapy WFL for tasks assessed/performed                   Past Medical History:  Diagnosis Date   Anxiety    Arthritis    Asthma    Back pain    Bilateral swelling of feet    Constipation    Depression    Diabetes mellitus without complication (HCC)    Gallbladder problem    GERD (gastroesophageal reflux disease)    History of low potassium    Hypertension    Joint pain    Obesity    Palpitations    Shortness of breath    Sleep apnea    Past Surgical History:  Procedure Laterality Date   ABDOMINAL HYSTERECTOMY     APPENDECTOMY     CATARACT EXTRACTION, BILATERAL     CHOLECYSTECTOMY     KNEE SURGERY Left    arthroscopy   SHOULDER SURGERY Right    TOTAL KNEE ARTHROPLASTY Right 09/14/2022   Procedure: RIGHT TOTAL KNEE ARTHROPLASTY;  Surgeon: Vernetta Lonni GRADE, MD;  Location: [facility];  Service: Orthopedics;  Laterality: Right;   Patient Active Problem List   Diagnosis Date Noted   Orthostatic hypotension 06/18/2023   Status post right knee replacement 09/16/2022   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/14/2022   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   BMI 40.0-44.9, adult (HCC)-current bmi 37.3 03/20/2022   Morbid obesity (HCC) 01/30/2022   BMI 37.0-37.9, adult 01/30/2022   B12 deficiency due to diet 09/27/2021   SOB (shortness of breath) on exertion 09/27/2021   Chronic vertigo 09/27/2021   Other hyperlipidemia 08/28/2021   Vitamin D  deficiency 08/28/2021   Pre-diabetes 06/28/2021   Sepsis (HCC)  05/15/2021   Hypoxia 05/15/2021   Essential hypertension 05/15/2021   DM (diabetes mellitus), type 2 (HCC) 05/15/2021   Hypomagnesemia 05/15/2021   UTI (urinary tract infection) 05/14/2021   Acute exacerbation of chronic low back pain 03/10/2021   OSA on CPAP 09/03/2018   Mild persistent asthma without complication 09/03/2018   Class 3 severe obesity with serious comorbidity and body mass index (BMI) of 40.0 to 44.9 in adult (HCC) 04/14/2018   Unilateral primary osteoarthritis, left knee 04/14/2018   Asthma in adult, mild intermittent, uncomplicated 02/26/2017   Allergic rhinitis 09/05/2013    PCP: Stephane Leita DEL, MD   REFERRING PROVIDER: Vernetta Lonni GRADE*   REFERRING DIAG: [phone] (ICD-10-CM) - Chronic left shoulder pain M19.012 (ICD-10-CM) - Primary osteoarthritis of left shoulder  Rationale for Evaluation and Treatment: Rehabilitation  THERAPY DIAG:  Acute pain of left shoulder  Stiffness of left shoulder, not elsewhere classified  Muscle weakness (generalized)  ONSET DATE: Fall 2024  SUBJECTIVE:  SUBJECTIVE STATEMENT: Pt reports no rest pain today.  Some pain with motion. Hand dominance:  Right PERTINENT HISTORY: R TKA 9/24  PAIN: Are you having pain?  Yes: NPRS scale: 8/10 Pain location: anterior, posterior and some radiating pain Pain description: achy, sharp Aggravating factors: lifting, reaching Relieving factors: OTC  PRECAUTIONS: None  RED FLAGS: None   WEIGHT BEARING RESTRICTIONS: No  FALLS:  Has patient fallen in last 6 months? No  LIVING ENVIRONMENT: Lives with: lives with their family  OCCUPATION: retired  PLOF: Independent  PATIENT GOALS: 1  NEXT MD VISIT:not scheduled  OBJECTIVE:  Note: Objective measures were completed at Evaluation  unless otherwise noted.  DIAGNOSTIC FINDINGS:  none  PATIENT SURVEYS :  PSFS: THE PATIENT SPECIFIC FUNCTIONAL SCALE  Place score of 0-10 (0 = unable to perform activity and 10 = able to perform activity at the same level as before injury or problem)  Activity Date: 08/27/23    Doing hair 1    2.lifting arm up 3    3.rotating arm 2    4.      Total Score 2      Total Score = Sum of activity scores/number of activities  Minimally Detectable Change: 3 points (for single activity); 2 points (for average score)  [facility] (nd). The Patient Specific Functional Scale . Retrieved from [email or web address]   COGNITION: Overall cognitive status: Within functional limits for tasks assessed     SENSATION: WFL  POSTURE: Slight rounded shoulders  UPPER EXTREMITY ROM:    AROM/PROM Right eval Left eval  Shoulder flexion  95/108  Shoulder extension  WNL  Shoulder abduction  55/118  Shoulder adduction    Shoulder internal rotation  50%  Shoulder external rotation  32/48  Elbow flexion    Elbow extension    Wrist flexion    Wrist extension    Wrist ulnar deviation    Wrist radial deviation    Wrist pronation    Wrist supination    (Blank rows = not tested)  UPPER EXTREMITY MMT:  MMT Right eval Left eval  Shoulder flexion  3+  Shoulder extension    Shoulder abduction  3+  Shoulder adduction    Shoulder internal rotation  4  Shoulder external rotation  3+  Middle trapezius    Lower trapezius    Elbow flexion    Elbow extension    Wrist flexion    Wrist extension    Wrist ulnar deviation    Wrist radial deviation    Wrist pronation    Wrist supination    Grip strength (lbs)    (Blank rows = not tested)  SHOULDER SPECIAL TESTS: Impingement tests: Painful arc test: positive  Rotator cuff assessment: Drop arm test: negative  JOINT MOBILITY TESTING:  Decreased throughout GH joint  PALPATION:   1+ tenderness at supraspinatus insertion  TREATMENT DATE: L Shoulder 10/07/23 There ex Pulleys 3 min Isometrics with ball in doorway 10x 5 sec hold ER,IR, ext, abd, flex There Act: For reaching, pulling, dressing Supine wand for flexion 8x Supine chest press 10x 1# Ladder walks 5x Supine punches 2x5 1# Neuro Re ed : for posture Tband rows Green 2x10 Scap squeezes 2x10 Manual  PROM/scap mobs     10/02/23 Ther Ex: Pulleys 3 min Isometrics with ball in doorway 10x 5 sec hold ER,IR, ext, abd, flex There Act: For reaching, pulling, dressing Supine wand for flexion 8x Supine chest press 10x 1# Ladder walks 5x Supine punches 2x5 1# Neuro Re ed : for posture Tband rows Green 2x10 Scap squeezes 2x10 Manual  PROM/scap mobs   09/30/23 Ther Ex: Pulleys 3 min Isometrics with ball in doorway 10x 5 sec hold ER,IR, ext, abd, flex There Act: For reaching, pulling, dressing Supine wand for flexion 8x Supine chest press 10x 1# Ladder walks 5x Supine punches 2x5 1# Neuro Re ed : for posture Tband rows Red 2x10 Scap squeezes 2x10 Manual  PROM/scap mobs  09/25/23 Ther Ex: Pulleys 3 min Isometrics with ball in doorway 10x 5 sec hold ER,IR, ext, abd, flex There Act: For reaching, pulling, dressing Supine wand for flexion 5x Supine chest press 10x 1# Ladder walks 5x Neuro Re ed : for posture Tband rows Red 2x10 Scap squeezes 2x10 Manual  PROM/scap mobs  09/23/23 L shoulder Ther Ex: Pulleys 3 min Isometrics with ball in doorway 10x 5 sec hold ER,IR, ext, abd, flex There Act: For reaching, pulling, dressing Supine wand for flexion 10x  Supine chest press 10x 1# Ladder walks 5x Neuro Re ed : for posture Tband rows Red 3x10 Scap squeezes 2x10 Manual  PROM/scap mobs  08/27/23 Evaluation completed followed by trial of HEP.   PATIENT  EDUCATION: Education details: HEP Person educated: Patient Education method: Solicitor, Actor cues, and Verbal cues Education comprehension: verbalized understanding, returned demonstration, and verbal cues required  HOME EXERCISE PROGRAM: Access Code: [email or web address] URL: [email or web address] Date: 09/19/2023 Prepared by: Burnard Meth  Exercises - Seated Scapular Retraction  - 2 x daily - 7 x weekly - 2 sets - 10 reps - Seated Shoulder Shrugs  - 2 x daily - 7 x weekly - 2 sets - 10 reps - Standing Isometric Shoulder Internal Rotation at Doorway  - 2 x daily - 7 x weekly - 2 sets - 10 reps - 3 hold - Isometric Shoulder Flexion at Wall  - 2 x daily - 7 x weekly - 2 sets - 10 reps - 3 hold - Isometric Shoulder Extension at Wall  - 2 x daily - 7 x weekly - 2 sets - 10 reps - 3 hold - Standing Isometric Shoulder External Rotation with Doorway  - 2 x daily - 7 x weekly - 2 sets - 10 reps - 3 hold - Isometric Shoulder Abduction at Wall  - 2 x daily - 7 x weekly - 2 sets - 10 reps - 3 hold   ASSESSMENT:  CLINICAL IMPRESSION: Pt limited by pain with flexion > 90.  OBJECTIVE IMPAIRMENTS: decreased activity tolerance, decreased ROM, decreased strength, impaired flexibility, and pain.   ACTIVITY LIMITATIONS: carrying, bathing, dressing, reach over head, and hygiene/grooming  PARTICIPATION LIMITATIONS: meal prep, cleaning, driving, and hairstyling  PERSONAL FACTORS: -none  REHAB POTENTIAL: Good  CLINICAL DECISION MAKING: Stable/uncomplicated  EVALUATION COMPLEXITY: Moderate  GOALS: Goals reviewed with patient? Yes  SHORT TERM GOALS: Target date: 09/25/2023  Pt to be independent with HEP. Baseline: Goal status: MET 09/19/23  2.  Decrease pain by 1 level. Baseline: up to 10/10 Goal status: MET 09/30/23  LONG TERM GOALS: Target date: 10/22/2023    Pt is independent with a self progressive HEP. Baseline:  Goal status: INITIAL  2.  Decrease pain  to max 2/10 with all activities. Baseline:  Goal status: INITIAL  3.  Increase AROM to WFL. Baseline:  Goal status: INITIAL  4.  Increase strength to at least 4+/5 throughout.  Baseline: 3-3+ Goal status: INITIAL  5.  Increase score of PSFS by at least 2 points for detectable change. Baseline: 3 Goal status: INITIAL  PLAN: PT FREQUENCY: 2x/week  PT DURATION: 8 weeks  PLANNED INTERVENTIONS: 97164- PT Re-evaluation, 97110-Therapeutic exercises, 97530- Therapeutic activity, [id number]- Neuromuscular re-education, 97535- Self Care, [id number]- Manual therapy, [id number]- Electrical stimulation (unattended), Patient/Family education, Cryotherapy, and Moist heat  PLAN FOR NEXT SESSION: Continue with treatment for ROM and strengthening.    Burnard Meth, PT 10/07/23  5:00 PM

## 2023-10-07 ENCOUNTER — Ambulatory Visit

## 2023-10-07 DIAGNOSIS — M25512 Pain in left shoulder: Secondary | ICD-10-CM

## 2023-10-07 DIAGNOSIS — M25612 Stiffness of left shoulder, not elsewhere classified: Secondary | ICD-10-CM

## 2023-10-07 DIAGNOSIS — M6281 Muscle weakness (generalized): Secondary | ICD-10-CM

## 2023-10-08 NOTE — Therapy (Signed)
 OUTPATIENT PHYSICAL THERAPY SHOULDER TREATMENT   Patient Name: Crystal Jackson MRN: [id number] DOB:12-17-1945, 78 y.o., female Today's Date: 10/09/2023  END OF SESSION:    PT End of Session - 10/09/23 1609     Visit Number 11    Number of Visits 17    Date for Recertification  10/22/23    PT Start Time 1300    PT Stop Time 1340    PT Time Calculation (min) 40 min    Activity Tolerance Patient tolerated treatment well    Behavior During Therapy WFL for tasks assessed/performed                    Past Medical History:  Diagnosis Date   Anxiety    Arthritis    Asthma    Back pain    Bilateral swelling of feet    Constipation    Depression    Diabetes mellitus without complication (HCC)    Gallbladder problem    GERD (gastroesophageal reflux disease)    History of low potassium    Hypertension    Joint pain    Obesity    Palpitations    Shortness of breath    Sleep apnea    Past Surgical History:  Procedure Laterality Date   ABDOMINAL HYSTERECTOMY     APPENDECTOMY     CATARACT EXTRACTION, BILATERAL     CHOLECYSTECTOMY     KNEE SURGERY Left    arthroscopy   SHOULDER SURGERY Right    TOTAL KNEE ARTHROPLASTY Right 09/14/2022   Procedure: RIGHT TOTAL KNEE ARTHROPLASTY;  Surgeon: Vernetta Lonni GRADE, MD;  Location: [facility];  Service: Orthopedics;  Laterality: Right;   Patient Active Problem List   Diagnosis Date Noted   Orthostatic hypotension 06/18/2023   Status post right knee replacement 09/16/2022   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/14/2022   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   BMI 40.0-44.9, adult (HCC)-current bmi 37.3 03/20/2022   Morbid obesity (HCC) 01/30/2022   BMI 37.0-37.9, adult 01/30/2022   B12 deficiency due to diet 09/27/2021   SOB (shortness of breath) on exertion 09/27/2021   Chronic vertigo 09/27/2021   Other hyperlipidemia 08/28/2021   Vitamin D  deficiency 08/28/2021   Pre-diabetes 06/28/2021   Sepsis (HCC)  05/15/2021   Hypoxia 05/15/2021   Essential hypertension 05/15/2021   DM (diabetes mellitus), type 2 (HCC) 05/15/2021   Hypomagnesemia 05/15/2021   UTI (urinary tract infection) 05/14/2021   Acute exacerbation of chronic low back pain 03/10/2021   OSA on CPAP 09/03/2018   Mild persistent asthma without complication 09/03/2018   Class 3 severe obesity with serious comorbidity and body mass index (BMI) of 40.0 to 44.9 in adult (HCC) 04/14/2018   Unilateral primary osteoarthritis, left knee 04/14/2018   Asthma in adult, mild intermittent, uncomplicated 02/26/2017   Allergic rhinitis 09/05/2013    PCP: Stephane Leita DEL, MD   REFERRING PROVIDER: Vernetta Lonni GRADE*   REFERRING DIAG: [phone] (ICD-10-CM) - Chronic left shoulder pain M19.012 (ICD-10-CM) - Primary osteoarthritis of left shoulder  Rationale for Evaluation and Treatment: Rehabilitation  THERAPY DIAG:  Acute pain of left shoulder  Stiffness of left shoulder, not elsewhere classified  Muscle weakness (generalized)  ONSET DATE: Fall 2024  SUBJECTIVE:  SUBJECTIVE STATEMENT: Pt reports pain fluctuating with motions. Hand dominance:  Right PERTINENT HISTORY: R TKA 9/24  PAIN: Are you having pain?  Yes: NPRS scale: 4/10 Pain location: anterior, posterior and some radiating pain Pain description: achy, sharp Aggravating factors: lifting, reaching Relieving factors: OTC  PRECAUTIONS: None  RED FLAGS: None   WEIGHT BEARING RESTRICTIONS: No  FALLS:  Has patient fallen in last 6 months? No  LIVING ENVIRONMENT: Lives with: lives with their family  OCCUPATION: retired  PLOF: Independent  PATIENT GOALS: 1  NEXT MD VISIT:not scheduled  OBJECTIVE:  Note: Objective measures were completed at Evaluation unless otherwise  noted.  DIAGNOSTIC FINDINGS:  none  PATIENT SURVEYS :  PSFS: THE PATIENT SPECIFIC FUNCTIONAL SCALE  Place score of 0-10 (0 = unable to perform activity and 10 = able to perform activity at the same level as before injury or problem)  Activity Date: 08/27/23    Doing hair 1    2.lifting arm up 3    3.rotating arm 2    4.      Total Score 2      Total Score = Sum of activity scores/number of activities  Minimally Detectable Change: 3 points (for single activity); 2 points (for average score)  [facility] (nd). The Patient Specific Functional Scale . Retrieved from [email or web address]   COGNITION: Overall cognitive status: Within functional limits for tasks assessed     SENSATION: WFL  POSTURE: Slight rounded shoulders  UPPER EXTREMITY ROM:    AROM/PROM Right eval Left eval  Shoulder flexion  95/108  Shoulder extension  WNL  Shoulder abduction  55/118  Shoulder adduction    Shoulder internal rotation  50%  Shoulder external rotation  32/48  Elbow flexion    Elbow extension    Wrist flexion    Wrist extension    Wrist ulnar deviation    Wrist radial deviation    Wrist pronation    Wrist supination    (Blank rows = not tested)  UPPER EXTREMITY MMT:  MMT Right eval Left eval  Shoulder flexion  3+  Shoulder extension    Shoulder abduction  3+  Shoulder adduction    Shoulder internal rotation  4  Shoulder external rotation  3+  Middle trapezius    Lower trapezius    Elbow flexion    Elbow extension    Wrist flexion    Wrist extension    Wrist ulnar deviation    Wrist radial deviation    Wrist pronation    Wrist supination    Grip strength (lbs)    (Blank rows = not tested)  SHOULDER SPECIAL TESTS: Impingement tests: Painful arc test: positive  Rotator cuff assessment: Drop arm test: negative  JOINT MOBILITY TESTING:  Decreased throughout GH joint  PALPATION:  1+ tenderness at  supraspinatus insertion  TREATMENT DATE: L Shoulder 10/09/23 There ex Pulleys 3 min Isometrics with ball in doorway 10x 5 sec hold ER,IR, ext, abd, flex There Act: For reaching, pulling, dressing Supine wand for flexion 8x Supine chest press 10x 2# Ladder walks 5x Supine punches 2x8 1# Neuro Re ed : for posture Tband rows Green 2x10 Scap squeezes 2x10 Manual  PROM/scap mobs    10/07/23 There ex Pulleys 3 min Isometrics with ball in doorway 10x 5 sec hold ER,IR, ext, abd, flex There Act: For reaching, pulling, dressing Supine wand for flexion 8x Supine chest press 10x 1# Ladder walks 5x Supine punches 2x5 1# Neuro Re ed : for posture Tband rows Green 2x10 Scap squeezes 2x10 Manual  PROM/scap mobs     10/02/23 Ther Ex: Pulleys 3 min Isometrics with ball in doorway 10x 5 sec hold ER,IR, ext, abd, flex There Act: For reaching, pulling, dressing Supine wand for flexion 8x Supine chest press 10x 1# Ladder walks 5x Supine punches 2x5 1# Neuro Re ed : for posture Tband rows Green 2x10 Scap squeezes 2x10 Manual  PROM/scap mobs   09/30/23 Ther Ex: Pulleys 3 min Isometrics with ball in doorway 10x 5 sec hold ER,IR, ext, abd, flex There Act: For reaching, pulling, dressing Supine wand for flexion 8x Supine chest press 10x 1# Ladder walks 5x Supine punches 2x5 1# Neuro Re ed : for posture Tband rows Red 2x10 Scap squeezes 2x10 Manual  PROM/scap mobs  09/25/23 Ther Ex: Pulleys 3 min Isometrics with ball in doorway 10x 5 sec hold ER,IR, ext, abd, flex There Act: For reaching, pulling, dressing Supine wand for flexion 5x Supine chest press 10x 1# Ladder walks 5x Neuro Re ed : for posture Tband rows Red 2x10 Scap squeezes 2x10 Manual  PROM/scap mobs  09/23/23 L shoulder Ther Ex: Pulleys 3 min Isometrics with ball in doorway 10x 5  sec hold ER,IR, ext, abd, flex There Act: For reaching, pulling, dressing Supine wand for flexion 10x  Supine chest press 10x 1# Ladder walks 5x Neuro Re ed : for posture Tband rows Red 3x10 Scap squeezes 2x10 Manual  PROM/scap mobs  08/27/23 Evaluation completed followed by trial of HEP.   PATIENT EDUCATION: Education details: HEP Person educated: Patient Education method: Solicitor, Actor cues, and Verbal cues Education comprehension: verbalized understanding, returned demonstration, and verbal cues required  HOME EXERCISE PROGRAM: Access Code: [email or web address] URL: [email or web address] Date: 09/19/2023 Prepared by: Burnard Meth  Exercises - Seated Scapular Retraction  - 2 x daily - 7 x weekly - 2 sets - 10 reps - Seated Shoulder Shrugs  - 2 x daily - 7 x weekly - 2 sets - 10 reps - Standing Isometric Shoulder Internal Rotation at Doorway  - 2 x daily - 7 x weekly - 2 sets - 10 reps - 3 hold - Isometric Shoulder Flexion at Wall  - 2 x daily - 7 x weekly - 2 sets - 10 reps - 3 hold - Isometric Shoulder Extension at Wall  - 2 x daily - 7 x weekly - 2 sets - 10 reps - 3 hold - Standing Isometric Shoulder External Rotation with Doorway  - 2 x daily - 7 x weekly - 2 sets - 10 reps - 3 hold - Isometric Shoulder Abduction at Wall  - 2 x daily - 7 x weekly - 2 sets - 10 reps - 3 hold   ASSESSMENT:  CLINICAL IMPRESSION: Pt demonstrates increased strength by ability to increase to 2# bar with  chest presses.  OBJECTIVE IMPAIRMENTS: decreased activity tolerance, decreased ROM, decreased strength, impaired flexibility, and pain.   ACTIVITY LIMITATIONS: carrying, bathing, dressing, reach over head, and hygiene/grooming  PARTICIPATION LIMITATIONS: meal prep, cleaning, driving, and hairstyling  PERSONAL FACTORS: -none  REHAB POTENTIAL: Good  CLINICAL DECISION MAKING: Stable/uncomplicated  EVALUATION COMPLEXITY: Moderate  GOALS: Goals reviewed with  patient? Yes  SHORT TERM GOALS: Target date: 09/25/2023    Pt to be independent with HEP. Baseline: Goal status: MET 09/19/23  2.  Decrease pain by 1 level. Baseline: up to 10/10 Goal status: MET 09/30/23  LONG TERM GOALS: Target date: 10/22/2023    Pt is independent with a self progressive HEP. Baseline:  Goal status: INITIAL  2.  Decrease pain to max 2/10 with all activities. Baseline:  Goal status: INITIAL  3.  Increase AROM to WFL. Baseline:  Goal status: INITIAL  4.  Increase strength to at least 4+/5 throughout.  Baseline: 3-3+ Goal status: INITIAL  5.  Increase score of PSFS by at least 2 points for detectable change. Baseline: 3 Goal status: INITIAL  PLAN: PT FREQUENCY: 2x/week  PT DURATION: 8 weeks  PLANNED INTERVENTIONS: 97164- PT Re-evaluation, 97110-Therapeutic exercises, 97530- Therapeutic activity, [id number]- Neuromuscular re-education, 97535- Self Care, [id number]- Manual therapy, [id number]- Electrical stimulation (unattended), Patient/Family education, Cryotherapy, and Moist heat  PLAN FOR NEXT SESSION: Continue with treatment for ROM and strengthening.    Burnard Meth, PT 10/09/23  4:27 PM

## 2023-10-09 ENCOUNTER — Ambulatory Visit

## 2023-10-09 DIAGNOSIS — M6281 Muscle weakness (generalized): Secondary | ICD-10-CM

## 2023-10-09 DIAGNOSIS — M25612 Stiffness of left shoulder, not elsewhere classified: Secondary | ICD-10-CM

## 2023-10-09 DIAGNOSIS — M25512 Pain in left shoulder: Secondary | ICD-10-CM

## 2023-10-15 DIAGNOSIS — Z23 Encounter for immunization: Secondary | ICD-10-CM | POA: Diagnosis not present

## 2023-10-22 NOTE — Therapy (Addendum)
 OUTPATIENT PHYSICAL THERAPY SHOULDER TREATMENT/RECERTIFICATION/DISCHARGE   Patient Name: Crystal Jackson MRN: [id number] DOB:1945-01-21, 78 y.o., female Today's Date: 10/23/2023  END OF SESSION:    PT End of Session - 10/23/23 1700     Visit Number 12    Number of Visits 17    Date for Recertification  11/02/23    PT Start Time 1515    PT Stop Time 1555    PT Time Calculation (min) 40 min    Activity Tolerance Patient tolerated treatment well    Behavior During Therapy WFL for tasks assessed/performed                     Past Medical History:  Diagnosis Date   Anxiety    Arthritis    Asthma    Back pain    Bilateral swelling of feet    Constipation    Depression    Diabetes mellitus without complication (HCC)    Gallbladder problem    GERD (gastroesophageal reflux disease)    History of low potassium    Hypertension    Joint pain    Obesity    Palpitations    Shortness of breath    Sleep apnea    Past Surgical History:  Procedure Laterality Date   ABDOMINAL HYSTERECTOMY     APPENDECTOMY     CATARACT EXTRACTION, BILATERAL     CHOLECYSTECTOMY     KNEE SURGERY Left    arthroscopy   SHOULDER SURGERY Right    TOTAL KNEE ARTHROPLASTY Right 09/14/2022   Procedure: RIGHT TOTAL KNEE ARTHROPLASTY;  Surgeon: Vernetta Lonni GRADE, MD;  Location: [facility];  Service: Orthopedics;  Laterality: Right;   Patient Active Problem List   Diagnosis Date Noted   Orthostatic hypotension 06/18/2023   Status post right knee replacement 09/16/2022   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/14/2022   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   BMI 40.0-44.9, adult (HCC)-current bmi 37.3 03/20/2022   Morbid obesity (HCC) 01/30/2022   BMI 37.0-37.9, adult 01/30/2022   B12 deficiency due to diet 09/27/2021   SOB (shortness of breath) on exertion 09/27/2021   Chronic vertigo 09/27/2021   Other hyperlipidemia 08/28/2021   Vitamin D  deficiency 08/28/2021   Pre-diabetes  06/28/2021   Sepsis (HCC) 05/15/2021   Hypoxia 05/15/2021   Essential hypertension 05/15/2021   DM (diabetes mellitus), type 2 (HCC) 05/15/2021   Hypomagnesemia 05/15/2021   UTI (urinary tract infection) 05/14/2021   Acute exacerbation of chronic low back pain 03/10/2021   OSA on CPAP 09/03/2018   Mild persistent asthma without complication 09/03/2018   Class 3 severe obesity with serious comorbidity and body mass index (BMI) of 40.0 to 44.9 in adult [facility]) 04/14/2018   Unilateral primary osteoarthritis, left knee 04/14/2018   Asthma in adult, mild intermittent, uncomplicated 02/26/2017   Allergic rhinitis 09/05/2013    PCP: Stephane Leita DEL, MD   REFERRING PROVIDER: Addie Cordella Hamilton, MD   REFERRING DIAG: [phone] (ICD-10-CM) - Chronic left shoulder pain M19.012 (ICD-10-CM) - Primary osteoarthritis of left shoulder  Rationale for Evaluation and Treatment: Rehabilitation  THERAPY DIAG:  Acute pain of left shoulder  Stiffness of left shoulder, not elsewhere classified  Muscle weakness (generalized)  ONSET DATE: Fall 2024  SUBJECTIVE:  SUBJECTIVE STATEMENT: Pt states pain comes and goes affecting chores and dressing. Hand dominance:  Right PERTINENT HISTORY: R TKA 9/24  PAIN: Are you having pain?  Yes: NPRS scale: 4/10 Pain location: anterior, posterior and some radiating pain Pain description: achy, sharp Aggravating factors: lifting, reaching Relieving factors: OTC  PRECAUTIONS: None  RED FLAGS: None   WEIGHT BEARING RESTRICTIONS: No  FALLS:  Has patient fallen in last 6 months? No  LIVING ENVIRONMENT: Lives with: lives with their family  OCCUPATION: retired  PLOF: Independent  PATIENT GOALS: 1  NEXT MD VISIT:not scheduled  OBJECTIVE:  Note: Objective measures  were completed at Evaluation unless otherwise noted.  DIAGNOSTIC FINDINGS:  none  PATIENT SURVEYS :  PSFS: THE PATIENT SPECIFIC FUNCTIONAL SCALE  Place score of 0-10 (0 = unable to perform activity and 10 = able to perform activity at the same level as before injury or problem)  Activity Date: 08/27/23 Date:  10/23/23   Doing hair 1 3   2.lifting arm up 3 4   3.rotating arm 2 4   4.      Total Score 2 3.66     Total Score = Sum of activity scores/number of activities  Minimally Detectable Change: 3 points (for single activity); 2 points (for average score)  [facility] (nd). The Patient Specific Functional Scale . Retrieved from [email or web address]   COGNITION: Overall cognitive status: Within functional limits for tasks assessed     SENSATION: WFL  POSTURE: Slight rounded shoulders  UPPER EXTREMITY ROM:    AROM/PROM Right eval Left eval Left 10/23/23  Seat/supine  Shoulder flexion  95/108 95/122       Shoulder extension  WNL   Shoulder abduction  55/118 55/90  Shoulder adduction     Shoulder internal rotation  50% 75%  Shoulder external rotation  32/48 51/45  Elbow flexion     Elbow extension     Wrist flexion     Wrist extension     Wrist ulnar deviation     Wrist radial deviation     Wrist pronation     Wrist supination     (Blank rows = not tested)  UPPER EXTREMITY MMT:  MMT Right eval Left eval Left  10/23/23  Shoulder flexion  3+ 4-  Shoulder extension     Shoulder abduction  3+ 4-  Shoulder adduction     Shoulder internal rotation  4 4  Shoulder external rotation  3+ 4-  Middle trapezius     Lower trapezius     Elbow flexion     Elbow extension     Wrist flexion     Wrist extension     Wrist ulnar deviation     Wrist radial deviation     Wrist pronation     Wrist supination     Grip strength (lbs)     (Blank rows = not tested)  SHOULDER SPECIAL TESTS: Impingement  tests: Painful arc test: positive  Rotator cuff assessment: Drop arm test: negative  JOINT MOBILITY TESTING:  Decreased throughout GH joint  PALPATION:  1+ tenderness at supraspinatus insertion  TREATMENT DATE: L Shoulder 10/23/23 There ex Pulleys 3 min Isometrics w PT 10x 5 sec hold ER,IR, ext, abd, flex There Act: For reaching, pulling, dressing Supine wand for flexion 8x Supine chest press 10x 2# Ladder walks 5x Supine punches 2x8 1# Neuro Re ed : for posture Tband rows Green 2x10 Scap squeezes 2x10 Manual  PROM/scap mobs  10/09/23 There ex Pulleys 3 min Isometrics with ball in doorway 10x 5 sec hold ER,IR, ext, abd, flex There Act: For reaching, pulling, dressing Supine wand for flexion 8x Supine chest press 10x 2# Ladder walks 5x Supine punches 2x8 1# Neuro Re ed : for posture Tband rows Green 2x10 Scap squeezes 2x10 Manual  PROM/scap mobs    10/07/23 There ex Pulleys 3 min Isometrics with ball in doorway 10x 5 sec hold ER,IR, ext, abd, flex There Act: For reaching, pulling, dressing Supine wand for flexion 8x Supine chest press 10x 1# Ladder walks 5x Supine punches 2x5 1# Neuro Re ed : for posture Tband rows Green 2x10 Scap squeezes 2x10 Manual  PROM/scap mobs   08/27/23 Evaluation completed followed by trial of HEP.   PATIENT EDUCATION: Education details: HEP Person educated: Patient Education method: Solicitor, Actor cues, and Verbal cues Education comprehension: verbalized understanding, returned demonstration, and verbal cues required  HOME EXERCISE PROGRAM: Access Code: [email or web address] URL: [email or web address] Date: 09/19/2023 Prepared by: Burnard Meth  Exercises - Seated Scapular Retraction  - 2 x daily - 7 x weekly - 2 sets - 10 reps - Seated Shoulder Shrugs  - 2 x daily - 7 x weekly  - 2 sets - 10 reps - Standing Isometric Shoulder Internal Rotation at Doorway  - 2 x daily - 7 x weekly - 2 sets - 10 reps - 3 hold - Isometric Shoulder Flexion at Wall  - 2 x daily - 7 x weekly - 2 sets - 10 reps - 3 hold - Isometric Shoulder Extension at Wall  - 2 x daily - 7 x weekly - 2 sets - 10 reps - 3 hold - Standing Isometric Shoulder External Rotation with Doorway  - 2 x daily - 7 x weekly - 2 sets - 10 reps - 3 hold - Isometric Shoulder Abduction at Wall  - 2 x daily - 7 x weekly - 2 sets - 10 reps - 3 hold   ASSESSMENT:  CLINICAL IMPRESSION:STG completed.  Pt has progressed in ROM and strength since IE but still limited by moderate to severe pain in motion > 90 degrees due to OA in [facility] joint.  Pt will continue with HEP as she awaits TSR surgery.  OBJECTIVE IMPAIRMENTS: decreased activity tolerance, decreased ROM, decreased strength, impaired flexibility, and pain.   ACTIVITY LIMITATIONS: carrying, bathing, dressing, reach over head, and hygiene/grooming  PARTICIPATION LIMITATIONS: meal prep, cleaning, driving, and hairstyling  PERSONAL FACTORS: -none  REHAB POTENTIAL: Good  CLINICAL DECISION MAKING: Stable/uncomplicated  EVALUATION COMPLEXITY: Moderate  GOALS: Goals reviewed with patient? Yes  SHORT TERM GOALS: Target date: 09/25/2023    Pt to be independent with HEP. Baseline: Goal status: MET 09/19/23  2.  Decrease pain by 1 level. Baseline: up to 10/10 Goal status: MET 09/30/23  LONG TERM GOALS: Target date: 11/02/23    Pt is independent with a self progressive HEP. Baseline:  Goal status:MET 10/23/23  2.  Decrease pain to max 2/10 with all activities. Baseline:  Goal status: ONGOING 10/23/23 3.  Increase AROM to WFL. Baseline:  Goal status: ONGOING 10/23/23  4.  Increase strength to at least 4+/5 throughout.  Baseline: 3-3+ Goal status: ONGOING 10/23/23  5.  Increase score of PSFS by at least 2 points for detectable change. Baseline: 3 Goal  status: ONGOING 10/23/23  PLAN: PT FREQUENCY: 2x/week  PT DURATION: 2 weeks  PLANNED INTERVENTIONS: 97164- PT Re-evaluation, 97110-Therapeutic exercises, 97530- Therapeutic activity, 97112- Neuromuscular re-education, 97535- Self Care, [id number]- Manual therapy, [id number]- Electrical stimulation (unattended), Patient/Family education, Cryotherapy, and Moist heat  PLAN FOR NEXT SESSION: DC to HEP as patient prepares for shoulder replacement surgery by end of year.   PHYSICAL THERAPY DISCHARGE SUMMARY  Visits from Start of Care: 12  Current functional level related to goals / functional outcomes: See above   Remaining deficits: ROM/strength   Education / Equipment: HEP   Patient agrees to discharge. Patient goals were partially met. Patient is being discharged due to maximized rehab potential.    Burnard Meth, PT 10/23/23  5:02 PM

## 2023-10-23 ENCOUNTER — Ambulatory Visit

## 2023-10-23 DIAGNOSIS — M25512 Pain in left shoulder: Secondary | ICD-10-CM | POA: Diagnosis not present

## 2023-10-23 DIAGNOSIS — M25612 Stiffness of left shoulder, not elsewhere classified: Secondary | ICD-10-CM | POA: Diagnosis not present

## 2023-10-23 DIAGNOSIS — M6281 Muscle weakness (generalized): Secondary | ICD-10-CM

## 2023-10-30 ENCOUNTER — Encounter

## 2023-11-04 ENCOUNTER — Encounter: Payer: Self-pay | Admitting: Radiology

## 2023-11-24 DIAGNOSIS — G4733 Obstructive sleep apnea (adult) (pediatric): Secondary | ICD-10-CM | POA: Diagnosis not present

## 2023-12-09 ENCOUNTER — Encounter: Payer: Self-pay | Admitting: Orthopedic Surgery

## 2023-12-09 ENCOUNTER — Other Ambulatory Visit: Payer: Self-pay

## 2023-12-09 DIAGNOSIS — G8929 Other chronic pain: Secondary | ICD-10-CM

## 2023-12-09 NOTE — Telephone Encounter (Signed)
 Please order thin cut CT scan left shoulder preop [facility] thanks

## 2023-12-12 ENCOUNTER — Other Ambulatory Visit: Payer: Self-pay

## 2023-12-12 ENCOUNTER — Ambulatory Visit: Admitting: Orthopaedic Surgery

## 2023-12-12 DIAGNOSIS — Z96651 Presence of right artificial knee joint: Secondary | ICD-10-CM

## 2023-12-12 NOTE — Progress Notes (Signed)
 The patient is a 78 year old female who is now 15 months status post a right total knee replacement.  She says the knee is doing well but she does get occasional discomfort when it is cold weather and she points to  lateral aspect of the knee as a source of her pain.  She states she is doing well overall.  She did have a recent fall landing on her backside and reports pain and she points to the right ischium as a source of her pain.  She is walking without any assistive device.  On examination her right knee moves smoothly and fluidly with full range of motion.  It feels ligamentously stable.  There is no swelling and no redness.  She does have pain to palpation over the right ischium but her hip moves smoothly and fluidly.  2 views of the right knee show well-seated right total knee arthroplasty with no complicating features.  An AP pelvis shows no fracture that can be seen around the ischium or other areas of the pelvis or acute findings.  There is joint space narrowing.  At this point she is doing well the follow-up can be as needed.  She does occasionally use a herbal type of patch on her right knee and if she does have any worsening issues of that knee she will let us  know.  All questions and concerns were addressed and answered.

## 2023-12-16 ENCOUNTER — Inpatient Hospital Stay
Admission: RE | Admit: 2023-12-16 | Discharge: 2023-12-16 | Attending: Orthopedic Surgery | Admitting: Orthopedic Surgery

## 2023-12-16 ENCOUNTER — Ambulatory Visit: Payer: Self-pay | Admitting: Orthopedic Surgery

## 2023-12-16 DIAGNOSIS — G8929 Other chronic pain: Secondary | ICD-10-CM

## 2023-12-16 NOTE — Progress Notes (Signed)
 CT scan done.  Can you post her for sometime when she wants to be done.  Thanks left reverse shoulder replacement.

## 2023-12-20 ENCOUNTER — Telehealth: Payer: Self-pay | Admitting: Orthopedic Surgery

## 2023-12-20 NOTE — Telephone Encounter (Signed)
 Patient has been informed that her pre-op clearance will be addressed at upcoming appt.

## 2023-12-20 NOTE — Telephone Encounter (Signed)
 Called patient to discuss surgery date for left reverse shoulder arthroplasty with Dr Addie.  No answer.  Left message on voicemail providing my name and direct number for scheduling.

## 2023-12-20 NOTE — Telephone Encounter (Signed)
 Pt is requesting a callback regarding her wanting to move forward with having this procedure done now. The procedure date is 01/28/2024 and she's scheduled an office visit on 12/24/23. She'd like to know if there's anything else she should do since she was advised to contact our office to schedule an appt. Please advise

## 2023-12-24 ENCOUNTER — Encounter: Payer: Self-pay | Admitting: Cardiovascular Disease

## 2023-12-24 ENCOUNTER — Ambulatory Visit: Admitting: Cardiovascular Disease

## 2023-12-24 VITALS — BP 136/74 | HR 73 | Ht 64.0 in | Wt 224.0 lb

## 2023-12-24 DIAGNOSIS — I10 Essential (primary) hypertension: Secondary | ICD-10-CM | POA: Diagnosis not present

## 2023-12-24 DIAGNOSIS — Z01818 Encounter for other preprocedural examination: Secondary | ICD-10-CM | POA: Diagnosis not present

## 2023-12-24 NOTE — Progress Notes (Signed)
 " Cardiology Office Note:  .   Date:  12/24/2023  ID:  Crystal Jackson, DOB 07-11-1945, MRN [id number] PCP: Stephane Leita DEL, MD  [facility] Providers Cardiologist:  Aleene Passe, MD (Inactive)   History of Present Illness: .    Chief Complaint  Patient presents with   Pre-op Exam         Crystal Jackson is a 78 y.o. female with below history who presents for preop evaluation.   History of Present Illness   Crystal Jackson is a 78 year old female who presents for a preoperative assessment for reverse shoulder replacement surgery. She is accompanied by her husband. She sees Dr. Stephane as her primary doctor.  She is scheduled for reverse shoulder replacement surgery on her left shoulder with Dr. Cordella Hutchinson on January 28, 2024.  She has a history of hypertension, managed with metoprolol  50 mg twice a day and spironolactone  12.5 mg daily, which effectively controls her blood pressure. She also has a history of asthma and sleep apnea. No history of heart attack, stroke, or congestive heart failure.  She experiences swelling in her legs, which worsens as the day progresses and improves by morning. She has difficulty climbing stairs due to leg problems, describing them as feeling like 'she doesn't work'.  Her family history is significant for heart disease in her mother. 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 She does not smoke or consume alcohol . She has one son and five grandchildren.  No chest pain, trouble breathing, heart attack, stroke, or congestive heart failure. Reports leg swelling and difficulty climbing stairs.           Problem List HTN    ROS: All other ROS reviewed and negative. Pertinent positives noted in the HPI.     Studies Reviewed: [name]   EKG Interpretation Date/Time:  Tuesday December 24 2023 11:11:44 EST Ventricular Rate:  73 PR Interval:  180 QRS Duration:  68 QT Interval:  370 QTC  Calculation: 407 R Axis:   -11  Text Interpretation: Normal sinus rhythm Confirmed by Barbaraann Kotyk [phone]) on 12/24/2023 11:20:51 AM   TTE 09/06/2021  1. Left ventricular ejection fraction, by estimation, is 60 to 65%. The  left ventricle has normal function. The left ventricle has no regional  wall motion abnormalities. 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 Left ventricular  diastolic parameters are indeterminate. Elevated left ventricular  end-diastolic pressure.   2. Right ventricular systolic function is normal. The right ventricular  size is normal. There is normal pulmonary artery systolic pressure. The  estimated right ventricular systolic pressure is 18.4 mmHg.   3. The mitral valve is normal in structure. Trivial mitral valve  regurgitation. No evidence of mitral stenosis.   4. The aortic valve is normal in structure. Aortic valve regurgitation is  not visualized. No aortic stenosis is present.   5. The inferior vena cava is normal in size with greater than 50%  respiratory variability, suggesting right atrial pressure of 3 mmHg.  Physical Exam:   VS:  BP 136/74   Pulse 73   Ht 5' 4 (1.626 m)   Wt 224 lb (101.6 kg)   SpO2 96%   BMI 38.45 kg/m    Wt Readings from Last 3 Encounters:  12/24/23 224 lb (101.6 kg)  06/18/23 221 lb 12.8 oz (100.6 kg)  05/03/23 221 lb 6.4 oz (100.4 kg)    GEN: Well  nourished, well developed in no acute distress NECK: No JVD; No carotid bruits CARDIAC: RRR, no murmurs, rubs, gallops RESPIRATORY:  Clear to auscultation without rales, wheezing or rhonchi  ABDOMEN: Soft, non-tender, non-distended EXTREMITIES:  No edema; No deformity  ASSESSMENT AND PLAN: .   Assessment and Plan    Preoperative cardiovascular evaluation Underwent evaluation for reverse shoulder replacement surgery. EKG and echocardiogram normal. RCRI zero, indicating very low perioperative cardiovascular risk (0.4%). - No further cardiac testing needed.   - Scheduled follow-up in one year unless changes occur.  Primary hypertension Hypertension well-controlled with metoprolol  and spironolactone . No chest pain or dyspnea. - Continue current antihypertensive regimen.                Follow-up: Return in about 1 year (around 12/23/2024).  Signed, Darryle DASEN. Barbaraann, MD, [facility]  [facility]  [address] [place], [place] [address] [phone]  11:31 AM   "

## 2023-12-24 NOTE — Patient Instructions (Signed)
 Medication Instructions:  Your physician recommends that you continue on your current medications as directed. Please refer to the Current Medication list given to you today.  *If you need a refill on your cardiac medications before your next appointment, please call your pharmacy*   Follow-Up: At [facility], you and your health needs are our priority.  As part of our continuing mission to provide you with exceptional heart care, our providers are all part of one team.  This team includes your primary Cardiologist (physician) and Advanced Practice Providers or APPs (Physician Assistants and Nurse Practitioners) who all work together to provide you with the care you need, when you need it.  Your next appointment:   12 month(s)  Provider:   One of our Advanced Practice Providers (APPs): Morse Clause, PA-C  Lamarr Satterfield, NP Miriam Shams, NP  Olivia Pavy, PA-C Josefa Beauvais, NP  Leontine Salen, PA-C Orren Fabry, PA-C  [place], PA-C Ernest Dick, NP  Damien Braver, NP Jon Hails, PA-C  Waddell Donath, PA-C    Dayna Dunn, PA-C  Scott Weaver, PA-C Lum Louis, NP Katlyn West, NP Callie Goodrich, PA-C  Xika Zhao, NP Sheng Haley, PA-C    Kathleen Johnson, PA-C

## 2024-01-06 NOTE — Progress Notes (Signed)
 Thanks very much [employer]

## 2024-01-09 ENCOUNTER — Other Ambulatory Visit (HOSPITAL_COMMUNITY): Payer: Self-pay

## 2024-01-09 ENCOUNTER — Encounter: Payer: Self-pay | Admitting: Pulmonary Disease

## 2024-01-09 ENCOUNTER — Ambulatory Visit: Admitting: Pulmonary Disease

## 2024-01-09 VITALS — BP 134/74 | HR 66 | Temp 97.5°F | Ht 64.0 in | Wt 220.0 lb

## 2024-01-09 DIAGNOSIS — J4489 Other specified chronic obstructive pulmonary disease: Secondary | ICD-10-CM | POA: Diagnosis not present

## 2024-01-09 DIAGNOSIS — R058 Other specified cough: Secondary | ICD-10-CM

## 2024-01-09 DIAGNOSIS — G4733 Obstructive sleep apnea (adult) (pediatric): Secondary | ICD-10-CM

## 2024-01-09 DIAGNOSIS — J4531 Mild persistent asthma with (acute) exacerbation: Secondary | ICD-10-CM

## 2024-01-09 DIAGNOSIS — K219 Gastro-esophageal reflux disease without esophagitis: Secondary | ICD-10-CM

## 2024-01-09 NOTE — Patient Instructions (Signed)
" °  VISIT SUMMARY: Today, we discussed your difficulty in reducing the use of your inhaler due to a recurring cough and the high cost of your medication. We also reviewed your well-managed sleep apnea and immunization status.  YOUR PLAN: ASTHMA: You have chronic asthma and are having trouble reducing your use of the Arnuity inhaler because of a recurring raspy cough. The cost of your inhaler has also increased with your new insurance. -We will check with the pharmacy team for alternatives to the Arnuity inhaler. -Please check with your pharmacy regarding insurance coverage for inhalers. -Continue using your current inhaler regimen until we find an alternative.  OBSTRUCTIVE SLEEP APNEA: Your sleep apnea is well-managed with CPAP therapy, which you use every night. -Continue using your CPAP therapy every night.  IMMUNIZATION STATUS: You have received your influenza vaccine but have not yet received your COVID-19 booster. -Please schedule an appointment to receive your COVID-19 booster. "

## 2024-01-09 NOTE — Progress Notes (Signed)
 [name]

## 2024-01-09 NOTE — Progress Notes (Signed)
 "              Elham Fini    [id number]    1945/08/26  Primary Care Physician:Wile, Leita DEL, MD  Referring Physician: Stephane Leita DEL, MD [address] [place],  [place] [address]  Chief complaint:   Follow up for Upper airway chronic cough,  Asthmatic bronchitis. GERD OSA on auto CPAP   HPI: Mrs. Maclaren is a 79 y.o.  with past medical history of high blood pressure. She had been diagnosed with bronchitis as a child. She reports increasing frequency of attacks of the past few years. Her latest episode was in Nov 2017  Her symptoms start with sinusitis postnasal drip which then proceeds to cough with yellow mucus, wheezing, chest congestion. She does not report any fevers or chills. She has seasonal allergies, rhinitis, postnasal drip. She has significant issues with acid reflux and heartburn and is on Protonix  40 mg twice a day. She was assessed by GI and was told she has a small hiatal hernia.  Symptoms have stabilized on Arnuity inhaler.  In 2019 she stopped it temporarily and noted increasing cough, dyspnea.  Interim History: Discussed the use of AI scribe software for clinical note transcription with the patient, who gave verbal consent to proceed.  History of Present Illness Kenzey Birkland is a 79 year old female who presents with difficulty weaning off her inhaler. She is accompanied by her husband, Bebe.  Inhaled corticosteroid dependence and cough - Difficulty weaning off Arnuity inhaler due to development of a raspy cough when spacing doses beyond every other day - Cough resolves with resumption of more frequent Arnuity use - Prior attempts to taper have resulted in the same symptoms - Currently using Arnuity every other day to every two days - Approximately two weeks of medication remaining  Medication access and cost - Recent switch to [facility] increased monthly Arnuity cost to $297 - Difficulty affording current medication regimen  Positive airway  pressure therapy - Uses CPAP every night - Finds CPAP comfortable and beneficial  Immunization status - Received influenza vaccine - Has not yet received COVID-19 booster    Outpatient Encounter Medications as of 01/09/2024  Medication Sig   albuterol  (VENTOLIN  HFA) 108 (90 Base) MCG/ACT inhaler Inhale 1-2 puffs into the lungs every 6 (six) hours as needed for wheezing or shortness of breath.   cetirizine (ZYRTEC) 10 MG tablet Take 10 mg by mouth daily. (Patient taking differently: Take 10 mg by mouth daily. prn)   clotrimazole-betamethasone (LOTRISONE) cream 1 application. daily as needed (eczema).   diclofenac  Sodium (VOLTAREN ) 1 % GEL Apply 2 g topically daily as needed (pain).   DULoxetine  (CYMBALTA ) 30 MG capsule Take 30 mg by mouth in the morning, at noon, and at bedtime.   Fluticasone  Furoate (ARNUITY ELLIPTA ) 200 MCG/ACT AEPB Inhale 1 puff into the lungs daily.   gabapentin  (NEURONTIN ) 300 MG capsule Take 300 mg by mouth 2 (two) times daily.   metoprolol  tartrate (LOPRESSOR ) 25 MG tablet Take 50 mg by mouth 2 (two) times daily.   Multiple Vitamin (MULTIVITAMIN) tablet Take 1 tablet by mouth daily.    NON FORMULARY Pt uses a cpap nightly   Omega-3 Fatty Acids (FISH OIL) 500 MG CAPS Take 500 mg by mouth daily.   pantoprazole  (PROTONIX ) 40 MG tablet Take 40 mg by mouth 2 (two) times daily.   Polyethylene Glycol 400 (BLINK TEARS OP) Place 1 drop into both eyes as needed (dry eyes).   spironolactone  (  ALDACTONE ) 25 MG tablet Take 12.5 mg by mouth daily. (Patient taking differently: Take 12.5 mg by mouth daily. Taking 50mg  once a day)   traZODone  (DESYREL ) 100 MG tablet Take 100 mg by mouth at bedtime.   triamcinolone  cream (KENALOG ) 0.1 % Apply 1 application. topically daily as needed (itching).   Xylitol 500 MG DISK Use as directed 1 tablet in the mouth or throat as needed (as needed for dry mouth).   b complex vitamins capsule Take 1 capsule by mouth daily.   Calcium  Carbonate-Vitamin  D 600-400 MG-UNIT tablet 1 po qd   No facility-administered encounter medications on file as of 01/09/2024.   Vitals:   01/09/24 1357  BP: 134/74  Pulse: 66  Temp: (!) 97.5 F (36.4 C)  Height: 5' 4 (1.626 m)  Weight: 220 lb (99.8 kg)  SpO2: 94%  TempSrc: Oral  BMI (Calculated): 37.74     Physical Exam GEN: No acute distress. CV: Regular rate and rhythm, no murmurs. LUNGS: Clear to auscultation bilaterally, normal respiratory effort. SKIN JOINTS: Warm and dry, no rash.   Data Reviewed: Images: CT abdomen pelvis 05/12/2021-visualized lung bases neurologic abnormality Chest x-ray 05/14/2021-visualized lung bases show mild left basal atelectasis I reviewed the images personally.  PFTs 7/44/17 FVC 2.54 [83%], FEV1 1.9 to [83%), F/F 76, TLC 89%, DLCO 72%. Minimal obstructive lung disease with mild reduction in diffusion capacity that corrects for alveolar volume.  FENO 04/15/17-18  Sleep study 07/20/15 Mild OSA, AHI 11.4. Low O2 sats of 71%.  CPAP download 09/23/2019 97% usage with residual AHI of 0.3, median pressure of 5.4  Assessment & Plan Asthma, asthmatic bronchitis Her PFTs show minimal obstruction. However there is marked reduction in mid flow rates indicating small airway disease with significant improvement post albuterol  inhaler.  She has tried to repeatedly wean herself off the ICS but has worsening symptoms within a few days.  Current Arnuity inhaler is tier three under new insurance, costing $297 per month. She is attempting to use inhaler every two days to extend supply. - Check with pharmacy team for alternatives to Arnuity inhaler. - Instructed her to check with her pharmacy regarding insurance coverage for inhalers. - Continue current inhaler regimen until alternatives are identified.  Upper airway cough Flonase , chlorphentermine as needed.  Obstructive sleep apnea Well-managed with CPAP AutoSet 5-15 therapy. She reports using CPAP every night and finds it  helpful. - Continue CPAP therapy nightly.  GERD Continues on protonix    Plan: - Check for alternatives to Arnuity inhaler - Continue chlorpheniramine , flonase  as needed - Autoset CPAP.   Marda Breidenbach MD [facility] 01/09/2024, 2:15 PM  CC: Stephane Leita DEL, MD  "

## 2024-01-14 ENCOUNTER — Telehealth: Payer: Self-pay

## 2024-01-14 NOTE — Telephone Encounter (Signed)
"   [facility] pharmacy states patient's Arnuity Ellipta  is covered under Part D. However patient's copay is very high, so they are needing an alternative. Please advice, thanks. "

## 2024-01-14 NOTE — Telephone Encounter (Signed)
 Will check with pharmacy team about alternatives for Arnuity

## 2024-01-16 ENCOUNTER — Encounter: Payer: Self-pay | Admitting: Pulmonary Disease

## 2024-01-22 NOTE — Progress Notes (Signed)
 Surgical Instructions   Your procedure is scheduled on Tuesday, January 27th. Report to [facility] Main Entrance A at 5:30 A.M., then check in with the Admitting office. Any questions or running late day of surgery: call [phone]  Questions prior to your surgery date: call [phone], Monday-Friday, 8am-4pm. If you experience any cold or flu symptoms such as cough, fever, chills, shortness of breath, etc. between now and your scheduled surgery, please notify us  at the above number.     Remember:  Do not eat after midnight the night before your surgery  You may drink clear liquids until 4:30 the morning of your surgery.   Clear liquids allowed are: Water , Non-Citrus Juices (without pulp), Carbonated Beverages, Clear Tea (no milk, honey, etc.), Black Coffee Only (NO MILK, CREAM OR POWDERED CREAMER of any kind), and Gatorade.    Take these medicines the morning of surgery with A SIP OF WATER   Fluticasone  Furoate (ARNUITY ELLIPTA ) inhaler  gabapentin  (NEURONTIN )  metoprolol  tartrate (LOPRESSOR )  pantoprazole  (PROTONIX )  valACYclovir (VALTREX)    May take these medicines IF NEEDED: albuterol  (VENTOLIN  HFA) inhaler - bring with you on day of surgery cetirizine (ZYRTEC)  Polyethylene Glycol 400 (BLINK TEARS eye drops)    One week prior to surgery, STOP taking any Aspirin  (unless otherwise instructed by your surgeon) Aleve, Naproxen, Ibuprofen, Motrin, Advil, Goody's, BC's, all herbal medications, and non-prescription vitamins. This includes your Omega-3 Fatty Acids (FISH OIL).                      Do NOT Smoke (Tobacco/Vaping) for 24 hours prior to your procedure.  If you use a CPAP at night, you may bring your mask/headgear for your overnight stay.   You will be asked to remove any contacts, glasses, piercing's, hearing aid's, dentures/partials prior to surgery. Please bring cases for these items if needed.    Your surgeon will determine if you are to be admitted or  discharged the same day.  Patients discharged the day of surgery will not be allowed to drive home, and someone needs to stay with them for 24 hours.  SURGICAL WAITING ROOM VISITATION Patients may have no more than 2 support people in the waiting area - these visitors may rotate.   Pre-op nurse will coordinate an appropriate time for 2 ADULT support persons, who may not rotate, to accompany patient in pre-op.  Children under the age of 71 must have an adult with them who is not the patient and must remain in the main waiting area with an adult.  If the patient needs to stay at the hospital during part of their recovery, the visitor guidelines for inpatient rooms apply.  Please refer to the [facility] website for the visitor guidelines for any additional information.   If you received a COVID test during your pre-op visit  it is requested that you wear a mask when out in public, stay away from anyone that may not be feeling well and notify your surgeon if you develop symptoms. If you have been in contact with anyone that has tested positive in the last 10 days please notify you surgeon.   Oral Hygiene is also important to reduce your risk of infection.  Remember - BRUSH YOUR TEETH THE MORNING OF SURGERY WITH YOUR REGULAR TOOTHPASTE     [facility]- Preparing for Total Shoulder Arthroplasty  Before surgery, you can play an important role. Because skin is not sterile, your skin needs to be as free  of germs as possible. You can reduce the number of germs on your skin by using the following products.   Benzoyl Peroxide Gel  o Reduces the number of germs present on the skin  o Applied twice a day to shoulder area starting two days before surgery   Chlorhexidine  Gluconate (CHG) Soap (instructions listed above on how to wash with CHG Soap)  o An antiseptic cleaner that kills germs and bonds with the skin to continue killing germs even after washing  o Used for showering the night before  surgery and morning of surgery   ==================================================================  Please follow these instructions carefully:  BENZOYL PEROXIDE 5% GEL  Please do not use if you have an allergy to benzoyl peroxide. If your skin becomes reddened/irritated stop using the benzoyl peroxide.  Starting two days before surgery, apply as follows:  1. Apply benzoyl peroxide in the morning and at night. Apply after taking a shower. If you are not taking a shower clean entire shoulder front, back, and side along with the armpit with a clean wet washcloth.  2. Place a quarter-sized dollop on your SHOULDER and rub in thoroughly, making sure to cover the front, back, and side of your shoulder, along with the armpit.   2 Days prior to Surgery First Dose on _____________ Morning Second Dose on ______________ Night  Day Before Surgery First Dose on ______________ Morning Night before surgery wash (entire body except face and private areas) with CHG Soap THEN Second Dose on ____________ Night      4. Do NOT apply benzoyl peroxide gel on the day of surgery   [facility]- Preparing For Surgery  Before surgery, you can play an important role. Because skin is not sterile, your skin needs to be as free of germs as possible. You can reduce the number of germs on your skin by washing with CHG (chlorahexidine gluconate) Soap before surgery.  CHG is an antiseptic cleaner which kills germs and bonds with the skin to continue killing germs even after washing.     Please do not use if you have an allergy to CHG or antibacterial soaps. If your skin becomes reddened/irritated stop using the CHG.  Do not shave (including legs and underarms) for at least 48 hours prior to first CHG shower. It is OK to shave your face.  Please follow these instructions carefully.     Shower the NIGHT BEFORE SURGERY  with CHG Soap.   If you chose to wash your hair, wash your hair first as usual with your  normal shampoo. After you shampoo, rinse your hair and body thoroughly to remove the shampoo.  Then [employer] and genitals (private parts) with your normal soap and rinse thoroughly to remove soap.  After that Use CHG Soap as you would any other liquid soap. You can apply CHG directly to the skin and wash gently with a scrungie or a clean washcloth.   Apply the CHG Soap to your body ONLY FROM THE NECK DOWN.  Do not use on open wounds or open sores. Avoid contact with your eyes, ears, mouth and genitals (private parts). Wash Face and genitals (private parts)  with your normal soap.   Wash thoroughly, paying special attention to the area where your surgery will be performed.  Thoroughly rinse your body with warm water  from the neck down.  DO NOT shower/wash with your normal soap after using and rinsing off the CHG Soap.  Pat yourself dry with a CLEAN TOWEL.  8. Apply the Benzoyl Peroxide only the night before surgery.  Do Not use it the morning of surgery.  Wear CLEAN PAJAMAS to bed the night before surgery  Place CLEAN SHEETS on your bed the night before your surgery  DO NOT SLEEP WITH PETS.   Day of Surgery: Wear Clean/Comfortable clothing the morning of surgery Do not apply any deodorants/lotions.   Remember to brush your teeth WITH YOUR REGULAR TOOTHPASTE.   Please read over the following fact sheets that you were given.           Pre-operative 4 CHG Bathing Instructions   You can play a key role in reducing the risk of infection after surgery. Your skin needs to be as free of germs as possible. You can reduce the number of germs on your skin by washing with CHG (chlorhexidine  gluconate) soap before surgery. CHG is an antiseptic soap that kills germs and continues to kill germs even after washing.   DO NOT use if you have an allergy to chlorhexidine /CHG or antibacterial soaps. If your skin becomes reddened or irritated, stop using the CHG and notify one of our RNs at  [phone].   Please shower with the CHG soap starting 4 days before surgery using the following schedule:     Please keep in mind the following:  DO NOT shave, including legs and underarms, starting the day of your first shower.   You may shave your face at any point before/day of surgery.  Place clean sheets on your bed the day you start using CHG soap. Use a clean washcloth (not used since being washed) for each shower. DO NOT sleep with pets once you start using the CHG.   CHG Shower Instructions:  Wash your face and private area with normal soap. If you choose to wash your hair, wash first with your normal shampoo.  After you use shampoo/soap, rinse your hair and body thoroughly to remove shampoo/soap residue.  Turn the water  OFF and apply  bottle of CHG soap to a CLEAN washcloth.  Apply CHG soap ONLY FROM YOUR NECK DOWN TO YOUR TOES (washing for 3-5 minutes)  DO NOT use CHG soap on face, private areas, open wounds, or sores.  Pay special attention to the area where your surgery is being performed.  If you are having back surgery, having someone wash your back for you may be helpful. Wait 2 minutes after CHG soap is applied, then you may rinse off the CHG soap.  Pat dry with a clean towel  Put on clean clothes/pajamas   If you choose to wear lotion, please use ONLY the CHG-compatible lotions that are listed below.  Additional instructions for the day of surgery:  If you choose, you may shower the morning of surgery with an antibacterial soap.  DO NOT APPLY any lotions, deodorants, cologne, or perfumes.   Do not bring valuables to the hospital. [facility] is not responsible for any belongings/valuables. Do not wear nail polish, gel polish, artificial nails, or any other type of covering on natural nails (fingers and toes) Do not wear jewelry or makeup Put on clean/comfortable clothes.  Please brush your teeth.  Ask your nurse before applying any prescription medications to  the skin.     CHG Compatible Lotions   Aveeno Moisturizing lotion  Cetaphil Moisturizing Cream  Cetaphil Moisturizing Lotion  Clairol Herbal Essence Moisturizing Lotion, Dry Skin  Clairol Herbal Essence Moisturizing Lotion, Extra Dry Skin  Clairol Herbal Essence Moisturizing Lotion, Normal  Skin  Curel Age Defying Therapeutic Moisturizing Lotion with Alpha Hydroxy  Curel Extreme Care Body Lotion  Curel Soothing Hands Moisturizing Hand Lotion  Curel Therapeutic Moisturizing Cream, Fragrance-Free  Curel Therapeutic Moisturizing Lotion, Fragrance-Free  Curel Therapeutic Moisturizing Lotion, Original Formula  Eucerin Daily Replenishing Lotion  Eucerin Dry Skin Therapy Plus Alpha Hydroxy Crme  Eucerin Dry Skin Therapy Plus Alpha Hydroxy Lotion  Eucerin Original Crme  Eucerin Original Lotion  Eucerin Plus Crme Eucerin Plus Lotion  Eucerin TriLipid Replenishing Lotion  Keri Anti-Bacterial Hand Lotion  Keri Deep Conditioning Original Lotion Dry Skin Formula Softly Scented  Keri Deep Conditioning Original Lotion, Fragrance Free Sensitive Skin Formula  Keri Lotion Fast Absorbing Fragrance Free Sensitive Skin Formula  Keri Lotion Fast Absorbing Softly Scented Dry Skin Formula  Keri Original Lotion  Keri Skin Renewal Lotion Keri Silky Smooth Lotion  Keri Silky Smooth Sensitive Skin Lotion  Nivea Body Creamy Conditioning Oil  Nivea Body Extra Enriched Lotion  Nivea Body Original Lotion  Nivea Body Sheer Moisturizing Lotion Nivea Crme  Nivea Skin Firming Lotion  NutraDerm 30 Skin Lotion  NutraDerm Skin Lotion  NutraDerm Therapeutic Skin Cream  NutraDerm Therapeutic Skin Lotion  ProShield Protective Hand Cream  Provon moisturizing lotion  Please read over the following fact sheets that you were given.

## 2024-01-23 ENCOUNTER — Encounter (HOSPITAL_COMMUNITY): Payer: Self-pay

## 2024-01-23 ENCOUNTER — Encounter (HOSPITAL_COMMUNITY)
Admission: RE | Admit: 2024-01-23 | Discharge: 2024-01-23 | Disposition: A | Discharge status: Home / Self Care | Source: Ambulatory Visit | Attending: Orthopedic Surgery | Admitting: Orthopedic Surgery

## 2024-01-23 ENCOUNTER — Other Ambulatory Visit: Payer: Self-pay

## 2024-01-23 VITALS — BP 134/64 | HR 75 | Temp 97.6°F | Resp 18 | Ht 64.0 in | Wt 221.3 lb

## 2024-01-23 DIAGNOSIS — Z01818 Encounter for other preprocedural examination: Secondary | ICD-10-CM | POA: Insufficient documentation

## 2024-01-23 HISTORY — DX: Myoneural disorder, unspecified: G70.9

## 2024-01-23 HISTORY — DX: Prediabetes: R73.03

## 2024-01-23 LAB — CBC
HCT: 38 % (ref 36.0–46.0)
Hemoglobin: 13.1 g/dL (ref 12.0–15.0)
MCH: 32.5 pg (ref 26.0–34.0)
MCHC: 34.5 g/dL (ref 30.0–36.0)
MCV: 94.3 fL (ref 80.0–100.0)
Platelets: 211 K/uL (ref 150–400)
RBC: 4.03 MIL/uL (ref 3.87–5.11)
RDW: 12.6 % (ref 11.5–15.5)
WBC: 5.8 K/uL (ref 4.0–10.5)
nRBC: 0 % (ref 0.0–0.2)

## 2024-01-23 LAB — BASIC METABOLIC PANEL WITH GFR
Anion gap: 8 (ref 5–15)
BUN: 18 mg/dL (ref 8–23)
CO2: 29 mmol/L (ref 22–32)
Calcium: 9.1 mg/dL (ref 8.9–10.3)
Chloride: 98 mmol/L (ref 98–111)
Creatinine, Ser: 1.06 mg/dL — ABNORMAL HIGH (ref 0.44–1.00)
GFR, Estimated: 54 mL/min — ABNORMAL LOW
Glucose, Bld: 132 mg/dL — ABNORMAL HIGH (ref 70–99)
Potassium: 4.5 mmol/L (ref 3.5–5.1)
Sodium: 135 mmol/L (ref 135–145)

## 2024-01-23 LAB — URINALYSIS, W/ REFLEX TO CULTURE (INFECTION SUSPECTED)
Bacteria, UA: NONE SEEN
Bilirubin Urine: NEGATIVE
Glucose, UA: NEGATIVE mg/dL
Hgb urine dipstick: NEGATIVE
Ketones, ur: NEGATIVE mg/dL
Leukocytes,Ua: NEGATIVE
Nitrite: NEGATIVE
Protein, ur: NEGATIVE mg/dL
Specific Gravity, Urine: 1.005 (ref 1.005–1.030)
pH: 6 (ref 5.0–8.0)

## 2024-01-23 LAB — SURGICAL PCR SCREEN
MRSA, PCR: NEGATIVE
Staphylococcus aureus: NEGATIVE

## 2024-01-23 NOTE — Progress Notes (Signed)
 PCP - Leita Blind, MD Cardiologist - Dr. Darryle Decent - last office visit 12/24/2023 with 1 year f/u  PPM/ICD - Denies Device Orders - n/a Rep Notified - n/a  Chest x-ray - n/a EKG - 12/24/2023 Stress Test - Denies ECHO - 09/06/2021 Cardiac Cath - Denies  Sleep Study - +OSA. Pt wears CPAP nightly, but unaware of pressure settings.  Pt is Pre-DM  Last dose of GLP1 agonist- n/a GLP1 instructions: n/a  Blood Thinner Instructions: n/a Aspirin  Instructions: n/a  ERAS Protcol - Clear liquids until 0430 morning of surgery PRE-SURGERY Ensure or G2- Ensure given to pt with instructions  COVID TEST- n/a   Anesthesia review: Yes. Cardiac clearance   Patient denies shortness of breath, fever, cough and chest pain at PAT appointment. Pt denies any respiratory illness/infection in the last two months.    All instructions explained to the patient, with a verbal understanding of the material. Patient agrees to go over the instructions while at home for a better understanding. Patient also instructed to self quarantine after being tested for COVID-19. The opportunity to ask questions was provided.

## 2024-01-23 NOTE — Progress Notes (Addendum)
 Surgical Instructions   Your procedure is scheduled on Tuesday, January 27th. Report to [facility] Main Entrance A at 5:30 A.M., then check in with the Admitting office. Any questions or running late day of surgery: call [phone]  Questions prior to your surgery date: call [phone], Monday-Friday, 8am-4pm. If you experience any cold or flu symptoms such as cough, fever, chills, shortness of breath, etc. between now and your scheduled surgery, please notify us  at the above number.     Remember:  Do not eat after midnight the night before your surgery  You may drink clear liquids until 4:30 am the morning of your surgery.   Clear liquids allowed are: Water , Non-Citrus Juices (without pulp), Carbonated Beverages, Clear Tea (no milk, honey, etc.), Black Coffee Only (NO MILK, CREAM OR POWDERED CREAMER of any kind), and Gatorade.  Patient Instructions  The night before surgery:  No food after midnight. ONLY clear liquids after midnight  The day of surgery (if you do NOT have diabetes):  Drink ONE (1) Pre-Surgery Clear Ensure by 4:30 AM the morning of surgery. Drink in one sitting. Do not sip.  This drink was given to you during your hospital  pre-op appointment visit.  Nothing else to drink after completing the  Pre-Surgery Clear Ensure.         If you have questions, please contact your surgeons office.   Take these medicines the morning of surgery with A SIP OF WATER   Fluticasone  Furoate (ARNUITY ELLIPTA ) inhaler  DULoxetine  (CYMBALTA )  gabapentin  (NEURONTIN )  metoprolol  tartrate (LOPRESSOR )  pantoprazole  (PROTONIX )  valACYclovir (VALTREX)    May take these medicines IF NEEDED: albuterol  (VENTOLIN  HFA) inhaler - bring with you on day of surgery cetirizine (ZYRTEC)  Polyethylene Glycol 400 (BLINK TEARS eye drops)    One week prior to surgery, STOP taking any Aspirin  (unless otherwise instructed by your surgeon) Aleve, Naproxen, Ibuprofen, Motrin, Advil, Goody's,  BC's, all herbal medications, and non-prescription vitamins. This includes your Omega-3 Fatty Acids (FISH OIL).              Do NOT Smoke (Tobacco/Vaping) for 24 hours prior to your procedure.  If you use a CPAP at night, you may bring your mask/headgear for your overnight stay.   You will be asked to remove any contacts, glasses, piercing's, hearing aid's, dentures/partials prior to surgery. Please bring cases for these items if needed.    Your surgeon will determine if you are to be admitted or discharged the same day.  Patients discharged the day of surgery will not be allowed to drive home, and someone needs to stay with them for 24 hours.  SURGICAL WAITING ROOM VISITATION Patients may have no more than 2 support people in the waiting area - these visitors may rotate.   Pre-op nurse will coordinate an appropriate time for 2 ADULT support persons, who may not rotate, to accompany patient in pre-op.  Children under the age of 83 must have an adult with them who is not the patient and must remain in the main waiting area with an adult.  If the patient needs to stay at the hospital during part of their recovery, the visitor guidelines for inpatient rooms apply.  Please refer to the [facility] website for the visitor guidelines for any additional information.   If you received a COVID test during your pre-op visit  it is requested that you wear a mask when out in public, stay away from anyone that may not be feeling well  and notify your surgeon if you develop symptoms. If you have been in contact with anyone that has tested positive in the last 10 days please notify you surgeon.        Pre-operative 4 CHG Bathing Instructions   You can play a key role in reducing the risk of infection after surgery. Your skin needs to be as free of germs as possible. You can reduce the number of germs on your skin by washing with CHG (chlorhexidine  gluconate) soap before surgery. CHG is an antiseptic  soap that kills germs and continues to kill germs even after washing.   DO NOT use if you have an allergy to chlorhexidine /CHG or antibacterial soaps. If your skin becomes reddened or irritated, stop using the CHG and notify one of our RNs at [phone].   Please shower with the CHG soap starting 4 days before surgery using the following schedule:     Please keep in mind the following:  DO NOT shave, including legs and underarms, starting the day of your first shower.   You may shave your face at any point before/day of surgery.  Place clean sheets on your bed the day you start using CHG soap. Use a clean washcloth (not used since being washed) for each shower. DO NOT sleep with pets once you start using the CHG.   CHG Shower Instructions:  Wash your face and private area with normal soap. If you choose to wash your hair, wash first with your normal shampoo.  After you use shampoo/soap, rinse your hair and body thoroughly to remove shampoo/soap residue.  Turn the water  OFF and apply  bottle of CHG soap to a CLEAN washcloth.  Apply CHG soap ONLY FROM YOUR NECK DOWN TO YOUR TOES (washing for 3-5 minutes)  DO NOT use CHG soap on face, private areas, open wounds, or sores.  Pay special attention to the area where your surgery is being performed.  If you are having back surgery, having someone wash your back for you may be helpful. Wait 2 minutes after CHG soap is applied, then you may rinse off the CHG soap.  Pat dry with a clean towel  Put on clean clothes/pajamas   If you choose to wear lotion, please use ONLY the CHG-compatible lotions that are listed below.  Additional instructions for the day of surgery:  If you choose, you may shower the morning of surgery with an antibacterial soap.  DO NOT APPLY any lotions, deodorants, cologne, or perfumes.   Do not bring valuables to the hospital. [facility] is not responsible for any belongings/valuables. Do not wear nail polish, gel  polish, artificial nails, or any other type of covering on natural nails (fingers and toes) Do not wear jewelry or makeup Put on clean/comfortable clothes.  Please brush your teeth.  Ask your nurse before applying any prescription medications to the skin.     CHG Compatible Lotions   Aveeno Moisturizing lotion  Cetaphil Moisturizing Cream  Cetaphil Moisturizing Lotion  Clairol Herbal Essence Moisturizing Lotion, Dry Skin  Clairol Herbal Essence Moisturizing Lotion, Extra Dry Skin  Clairol Herbal Essence Moisturizing Lotion, Normal Skin  Curel Age Defying Therapeutic Moisturizing Lotion with Alpha Hydroxy  Curel Extreme Care Body Lotion  Curel Soothing Hands Moisturizing Hand Lotion  Curel Therapeutic Moisturizing Cream, Fragrance-Free  Curel Therapeutic Moisturizing Lotion, Fragrance-Free  Curel Therapeutic Moisturizing Lotion, Original Formula  Eucerin Daily Replenishing Lotion  Eucerin Dry Skin Therapy Plus Alpha Hydroxy Crme  Eucerin Dry Skin Therapy  Plus Alpha Hydroxy Lotion  Eucerin Original Crme  Eucerin Original Lotion  Eucerin Plus Crme Eucerin Plus Lotion  Eucerin TriLipid Replenishing Lotion  Keri Anti-Bacterial Hand Lotion  Keri Deep Conditioning Original Lotion Dry Skin Formula Softly Scented  Keri Deep Conditioning Original Lotion, Fragrance Free Sensitive Skin Formula  Keri Lotion Fast Absorbing Fragrance Free Sensitive Skin Formula  Keri Lotion Fast Absorbing Softly Scented Dry Skin Formula  Keri Original Lotion  Keri Skin Renewal Lotion Keri Silky Smooth Lotion  Keri Silky Smooth Sensitive Skin Lotion  Nivea Body Creamy Conditioning Oil  Nivea Body Extra Enriched Lotion  Nivea Body Original Lotion  Nivea Body Sheer Moisturizing Lotion Nivea Crme  Nivea Skin Firming Lotion  NutraDerm 30 Skin Lotion  NutraDerm Skin Lotion  NutraDerm Therapeutic Skin Cream  NutraDerm Therapeutic Skin Lotion  ProShield Protective Hand Cream  Provon moisturizing  lotion   [facility]- Preparing for Total Shoulder Arthroplasty  Before surgery, you can play an important role. Because skin is not sterile, your skin needs to be as free of germs as possible. You can reduce the number of germs on your skin by using the following products.   Benzoyl Peroxide Gel  o Reduces the number of germs present on the skin  o Applied twice a day to shoulder area starting two days before surgery   Chlorhexidine  Gluconate (CHG) Soap (instructions listed above on how to wash with CHG Soap)  o An antiseptic cleaner that kills germs and bonds with the skin to continue killing germs even after washing  o Used for showering the night before surgery and morning of surgery   ==================================================================  Please follow these instructions carefully:  BENZOYL PEROXIDE 5% GEL  Please do not use if you have an allergy to benzoyl peroxide. If your skin becomes reddened/irritated stop using the benzoyl peroxide.  Starting two days before surgery, apply as follows:  1. Apply benzoyl peroxide in the morning and at night. Apply after taking a shower. If you are not taking a shower clean entire shoulder front, back, and side along with the armpit with a clean wet washcloth.  2. Place a quarter-sized dollop on your SHOULDER and rub in thoroughly, making sure to cover the front, back, and side of your shoulder, along with the armpit.   2 Days prior to Surgery First Dose on _____________ Morning Second Dose on ______________ Night  Day Before Surgery First Dose on ______________ Morning Night before surgery wash (entire body except face and private areas) with CHG Soap THEN Second Dose on ____________ Night    4. Do NOT apply benzoyl peroxide gel on the day of surgery    Please read over the following fact sheets that you were given.

## 2024-01-24 NOTE — Anesthesia Preprocedure Evaluation (Signed)
"                                    Anesthesia Evaluation    Airway        Dental   Pulmonary           Cardiovascular hypertension,      Neuro/Psych    GI/Hepatic   Endo/Other    Renal/GU      Musculoskeletal   Abdominal   Peds  Hematology   Anesthesia Other Findings   Reproductive/Obstetrics                              Anesthesia Physical Anesthesia Plan  ASA:   Anesthesia Plan:    Post-op Pain Management:    Induction:   PONV Risk Score and Plan:   Airway Management Planned:   Additional Equipment:   Intra-op Plan:   Post-operative Plan:   Informed Consent:   Plan Discussed with:   Anesthesia Plan Comments: (PAT note by Lynwood Hope, PA-C: 79 year old female follows with cardiology for history of HTN, lower extremity edema, family history of heart disease.  Echo 09/2021 showed LVEF 60 to 65%, normal RV, no significant valvular abnormalities.  Seen by Dr. Barbaraann 12/24/2023 for preop evaluation.  Per note, Underwent evaluation for reverse shoulder replacement surgery. EKG and echocardiogram normal. RCRI zero, indicating very low perioperative cardiovascular risk (0.4%). - No further cardiac testing needed.  Follows with pulmonology for history of asthmatic bronchitis on Arnuity and albuterol , upper airway cough syndrome, OSA on CPAP, GERD on PPI.  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  Preop labs reviewed, unremarkable.  EKG 12/24/2023: NSR.  Rate 73.  TTE 09/06/2021: 1. Left ventricular ejection fraction, by estimation, is 60 to 65%. The  left ventricle has normal function. The left ventricle has no regional  wall motion abnormalities. 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 Left ventricular  diastolic parameters are indeterminate. Elevated left ventricular  end-diastolic pressure.   2. Right ventricular systolic function is normal. The right ventricular   size is normal. There is normal pulmonary artery systolic pressure. The  estimated right ventricular systolic pressure is 18.4 mmHg.   3. The mitral valve is normal in structure. Trivial mitral valve  regurgitation. No evidence of mitral stenosis.   4. The aortic valve is normal in structure. Aortic valve regurgitation is  not visualized. No aortic stenosis is present.   5. The inferior vena cava is normal in size with greater than 50%  respiratory variability, suggesting right atrial pressure of 3 mmHg.   )         Anesthesia Quick Evaluation  "

## 2024-01-24 NOTE — Progress Notes (Signed)
 Anesthesia Chart Review:  79 year old female follows with cardiology for history of HTN, lower extremity edema, family history of heart disease.  Echo 09/2021 showed LVEF 60 to 65%, normal RV, no significant valvular abnormalities.  Seen by Dr. Barbaraann 12/24/2023 for preop evaluation.  Per note, Underwent evaluation for reverse shoulder replacement surgery. EKG and echocardiogram normal. RCRI zero, indicating very low perioperative cardiovascular risk (0.4%). - No further cardiac testing needed.  Follows with pulmonology for history of asthmatic bronchitis on Arnuity and albuterol , upper airway cough syndrome, OSA on CPAP, GERD on PPI.  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  Preop labs reviewed, unremarkable.  EKG 12/24/2023: NSR.  Rate 73.  TTE 09/06/2021: 1. Left ventricular ejection fraction, by estimation, is 60 to 65%. The  left ventricle has normal function. The left ventricle has no regional  wall motion abnormalities. 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 Left ventricular  diastolic parameters are indeterminate. Elevated left ventricular  end-diastolic pressure.   2. Right ventricular systolic function is normal. The right ventricular  size is normal. There is normal pulmonary artery systolic pressure. The  estimated right ventricular systolic pressure is 18.4 mmHg.   3. The mitral valve is normal in structure. Trivial mitral valve  regurgitation. No evidence of mitral stenosis.   4. The aortic valve is normal in structure. Aortic valve regurgitation is  not visualized. No aortic stenosis is present.   5. The inferior vena cava is normal in size with greater than 50%  respiratory variability, suggesting right atrial pressure of 3 mmHg.     Lynwood Geofm RIGGERS [facility] Short Stay Center/Anesthesiology Phone [phone] 01/24/2024 11:03 AM

## 2024-01-28 DIAGNOSIS — Z01818 Encounter for other preprocedural examination: Secondary | ICD-10-CM

## 2024-02-04 NOTE — Progress Notes (Addendum)
 Pt called to inform of new surgery date/time. Patient scheduled to arrive to hospital at 9:25AM on 02/18/24. Pt to stop drinking clear liquids by 8:25 that morning. Pt states she still has her PAT instructions for bathing/benzoyl peroxide. Pt had no further questions at this time.   Pt denies any new issues/complaints at this time.

## 2024-02-12 ENCOUNTER — Encounter: Admitting: Orthopedic Surgery

## 2024-02-18 ENCOUNTER — Ambulatory Visit (HOSPITAL_COMMUNITY): Admission: RE | Admit: 2024-02-18 | Payer: Self-pay | Source: Home / Self Care | Admitting: Orthopedic Surgery

## 2024-02-18 ENCOUNTER — Encounter (HOSPITAL_COMMUNITY): Admission: RE | Payer: Self-pay | Source: Home / Self Care

## 2024-02-18 ENCOUNTER — Encounter (HOSPITAL_COMMUNITY): Payer: Self-pay | Admitting: Physician Assistant

## 2024-02-18 DIAGNOSIS — Z01818 Encounter for other preprocedural examination: Secondary | ICD-10-CM

## 2024-03-04 ENCOUNTER — Encounter: Admitting: Orthopedic Surgery

## 2024-03-15 IMAGING — CT CT ABD-PELV W/ CM
2 of 5 series · 16 of 46 positions shown, 18 images · IV contrast (APPLIED)
Comparison: None Available.

CLINICAL DATA: Left lower quadrant and suprapubic pain.

EXAM:
CT ABDOMEN AND PELVIS WITH CONTRAST
TECHNIQUE: Multidetector CT imaging of the abdomen and pelvis was performed
using the standard protocol following bolus administration of
intravenous contrast.

[Series 2: abd pel w · axial · 0.77mm/px · z∈[+983,+1363]mm · 13 of 86 slices shown, 15 images]
[im 5/86  soft-tissue]
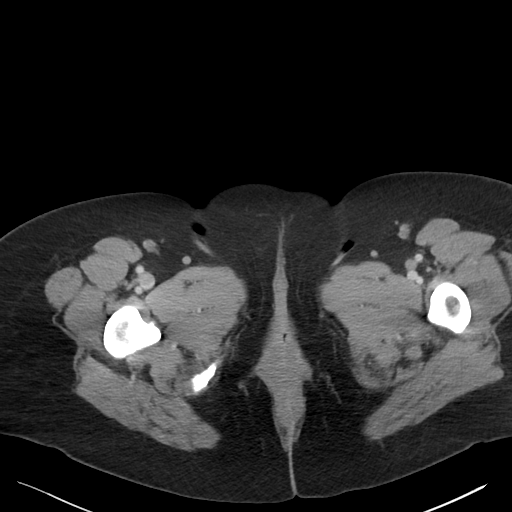
[im 5/86  bone]
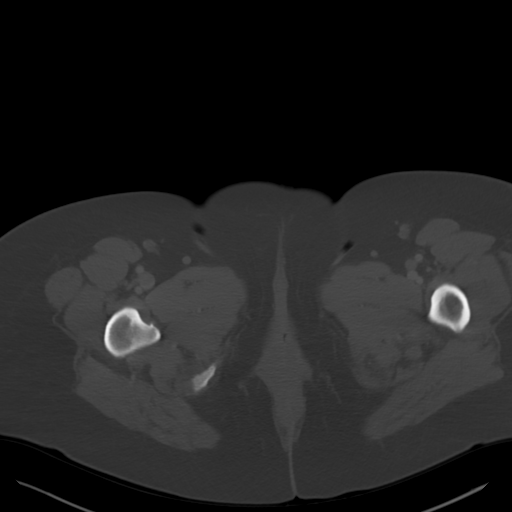
[im 14/86  soft-tissue]
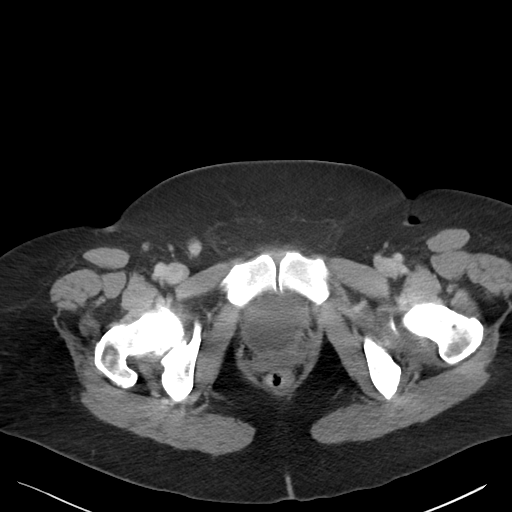
[im 18/86  soft-tissue]
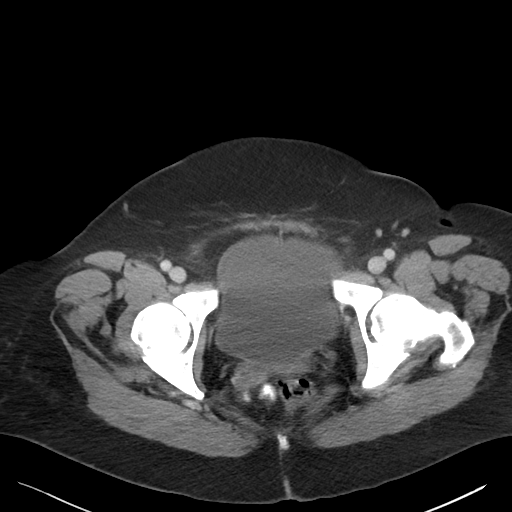
[im 23/86  soft-tissue]
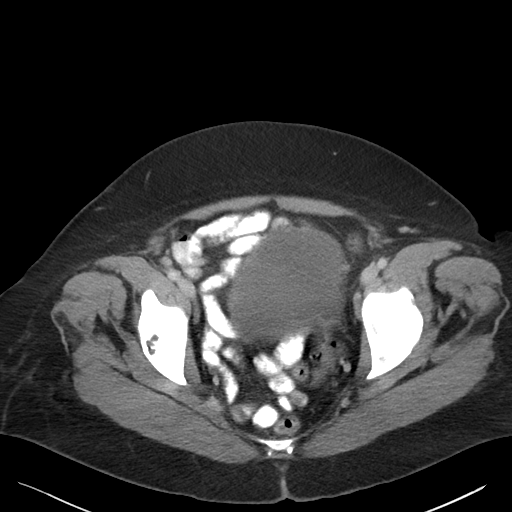
[im 32/86  soft-tissue]
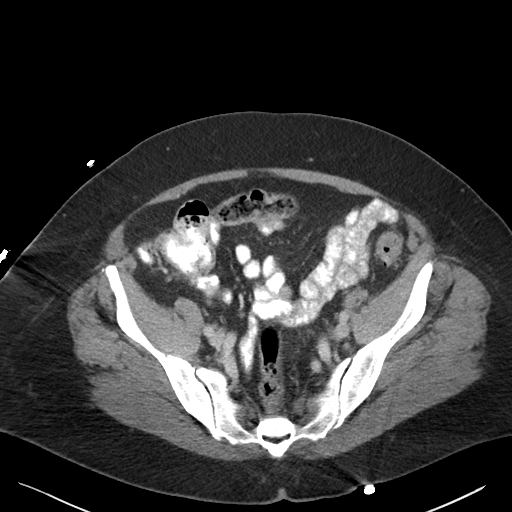
[im 36/86  soft-tissue]
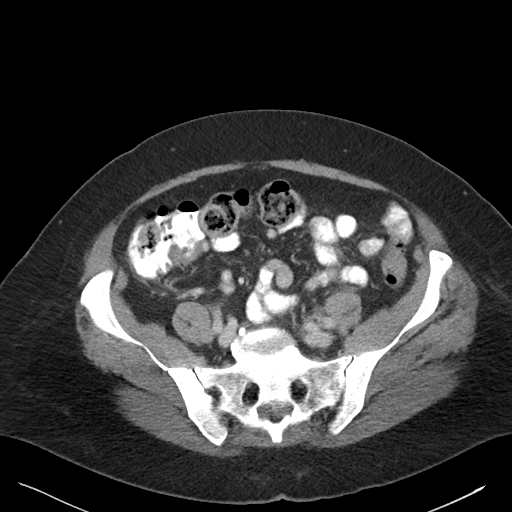
[im 45/86  soft-tissue]
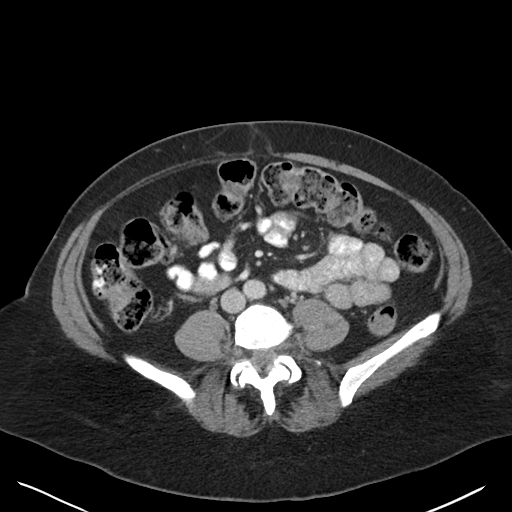
[im 50/86  soft-tissue]
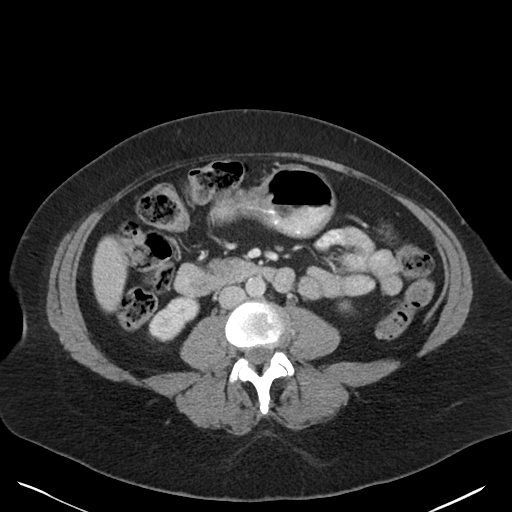
[im 54/86  soft-tissue]
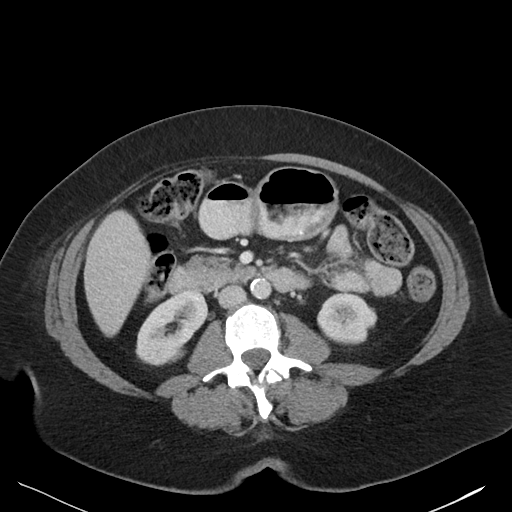
[im 54/86  bone]
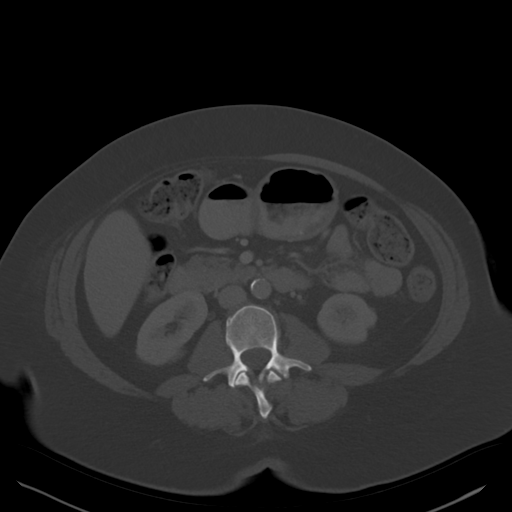
[im 63/86  soft-tissue]
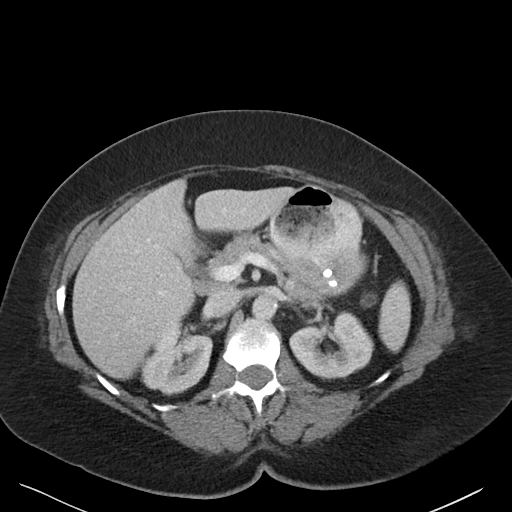
[im 68/86  soft-tissue]
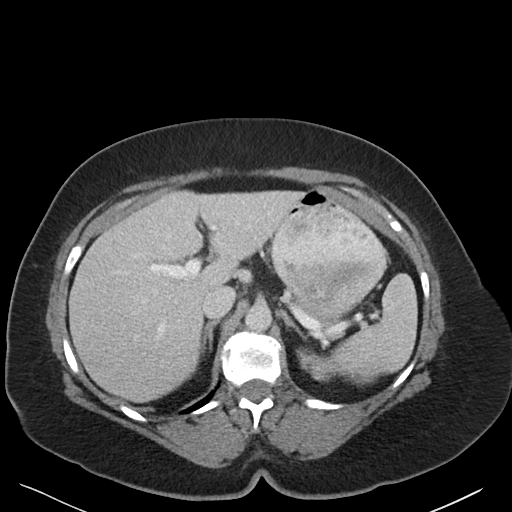
[im 72/86  soft-tissue]
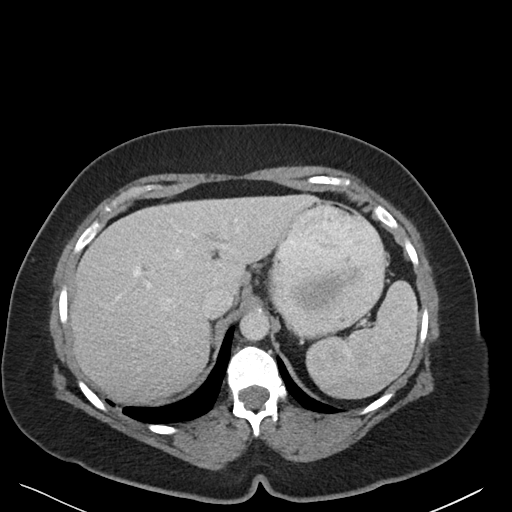
[im 81/86  soft-tissue]
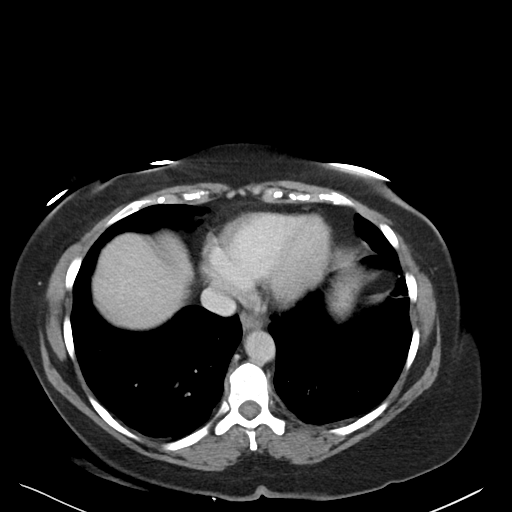

[Series 5: coronal · coronal · 0.81mm/px · 3 of 103 slices shown]
[im 35/103  soft-tissue]
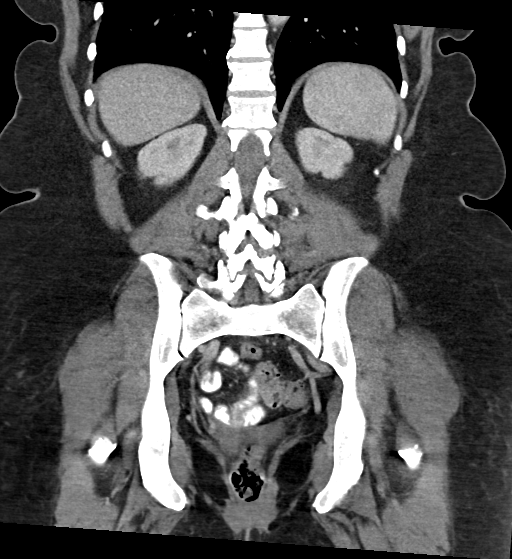
[im 46/103  soft-tissue]
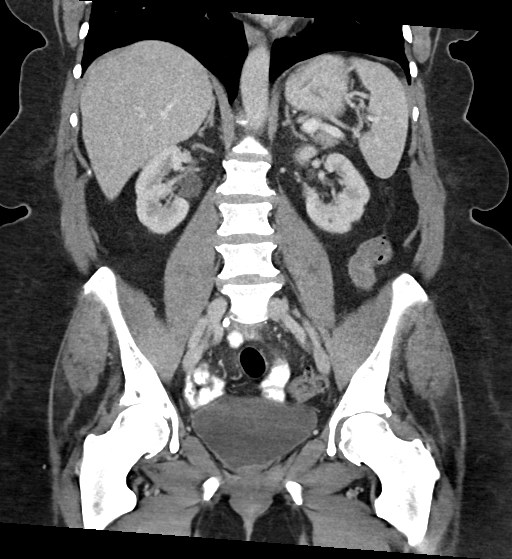
[im 57/103  soft-tissue]
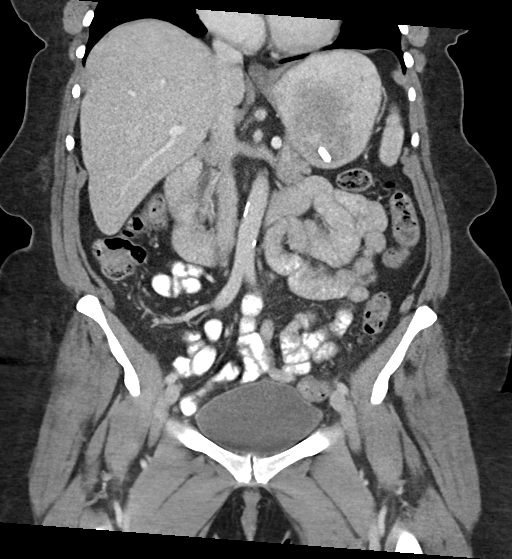

[16 of 46 positions shown; findings below may reference images not displayed]

RADIATION DOSE REDUCTION: This exam was performed according to the
departmental dose-optimization program which includes automated
exposure control, adjustment of the mA and/or kV according to
patient size and/or use of iterative reconstruction technique.

CONTRAST:  100mL OMNIPAQUE IOHEXOL 300 MG/ML  SOLN
FINDINGS: Lower chest: Unremarkable.

Hepatobiliary: No suspicious focal abnormality within the liver
parenchyma. Gallbladder is surgically absent. No intrahepatic or
extrahepatic biliary dilation.

Pancreas: No focal mass lesion. No dilatation of the main duct. No
intraparenchymal cyst. No peripancreatic edema.

Spleen: No splenomegaly. No focal mass lesion.

Adrenals/Urinary Tract: No adrenal nodule or mass. Kidneys
unremarkable. No evidence for hydroureter. The urinary bladder
appears normal for the degree of distention.

Stomach/Bowel: Stomach is distended with food and contrast material.
Duodenum is normally positioned as is the ligament of Treitz. No
small bowel wall thickening. No small bowel dilatation. The terminal
ileum is normal. Nonvisualization of the appendix is consistent with
the reported history of appendectomy. No gross colonic mass. No
colonic wall thickening. Diverticular changes are noted in the left
colon without evidence of diverticulitis.

Vascular/Lymphatic: There is mild atherosclerotic calcification of
the abdominal aorta without aneurysm. There is no gastrohepatic or
hepatoduodenal ligament lymphadenopathy. No retroperitoneal or
mesenteric lymphadenopathy. No pelvic sidewall lymphadenopathy.

Reproductive: Uterus surgically absent.  There is no adnexal mass.

Other: No intraperitoneal free fluid.

Musculoskeletal: No worrisome lytic or sclerotic osseous
abnormality. Degenerative changes noted right hip
IMPRESSION: 1. No acute findings in the abdomen or pelvis. No findings to
explain the patient's history of left lower quadrant and suprapubic
pain
2. Left colonic diverticulosis without diverticulitis.
3. Aortic Atherosclerosis ([id number]-922.2).

## 2024-03-17 IMAGING — DX DG CHEST 1V PORT
1 series · 1 of 1 positions shown · non-contrast
Comparison: November 28, 2015

CLINICAL DATA: Fever and vomiting.

EXAM:
PORTABLE CHEST 1 VIEW

[chest ap]
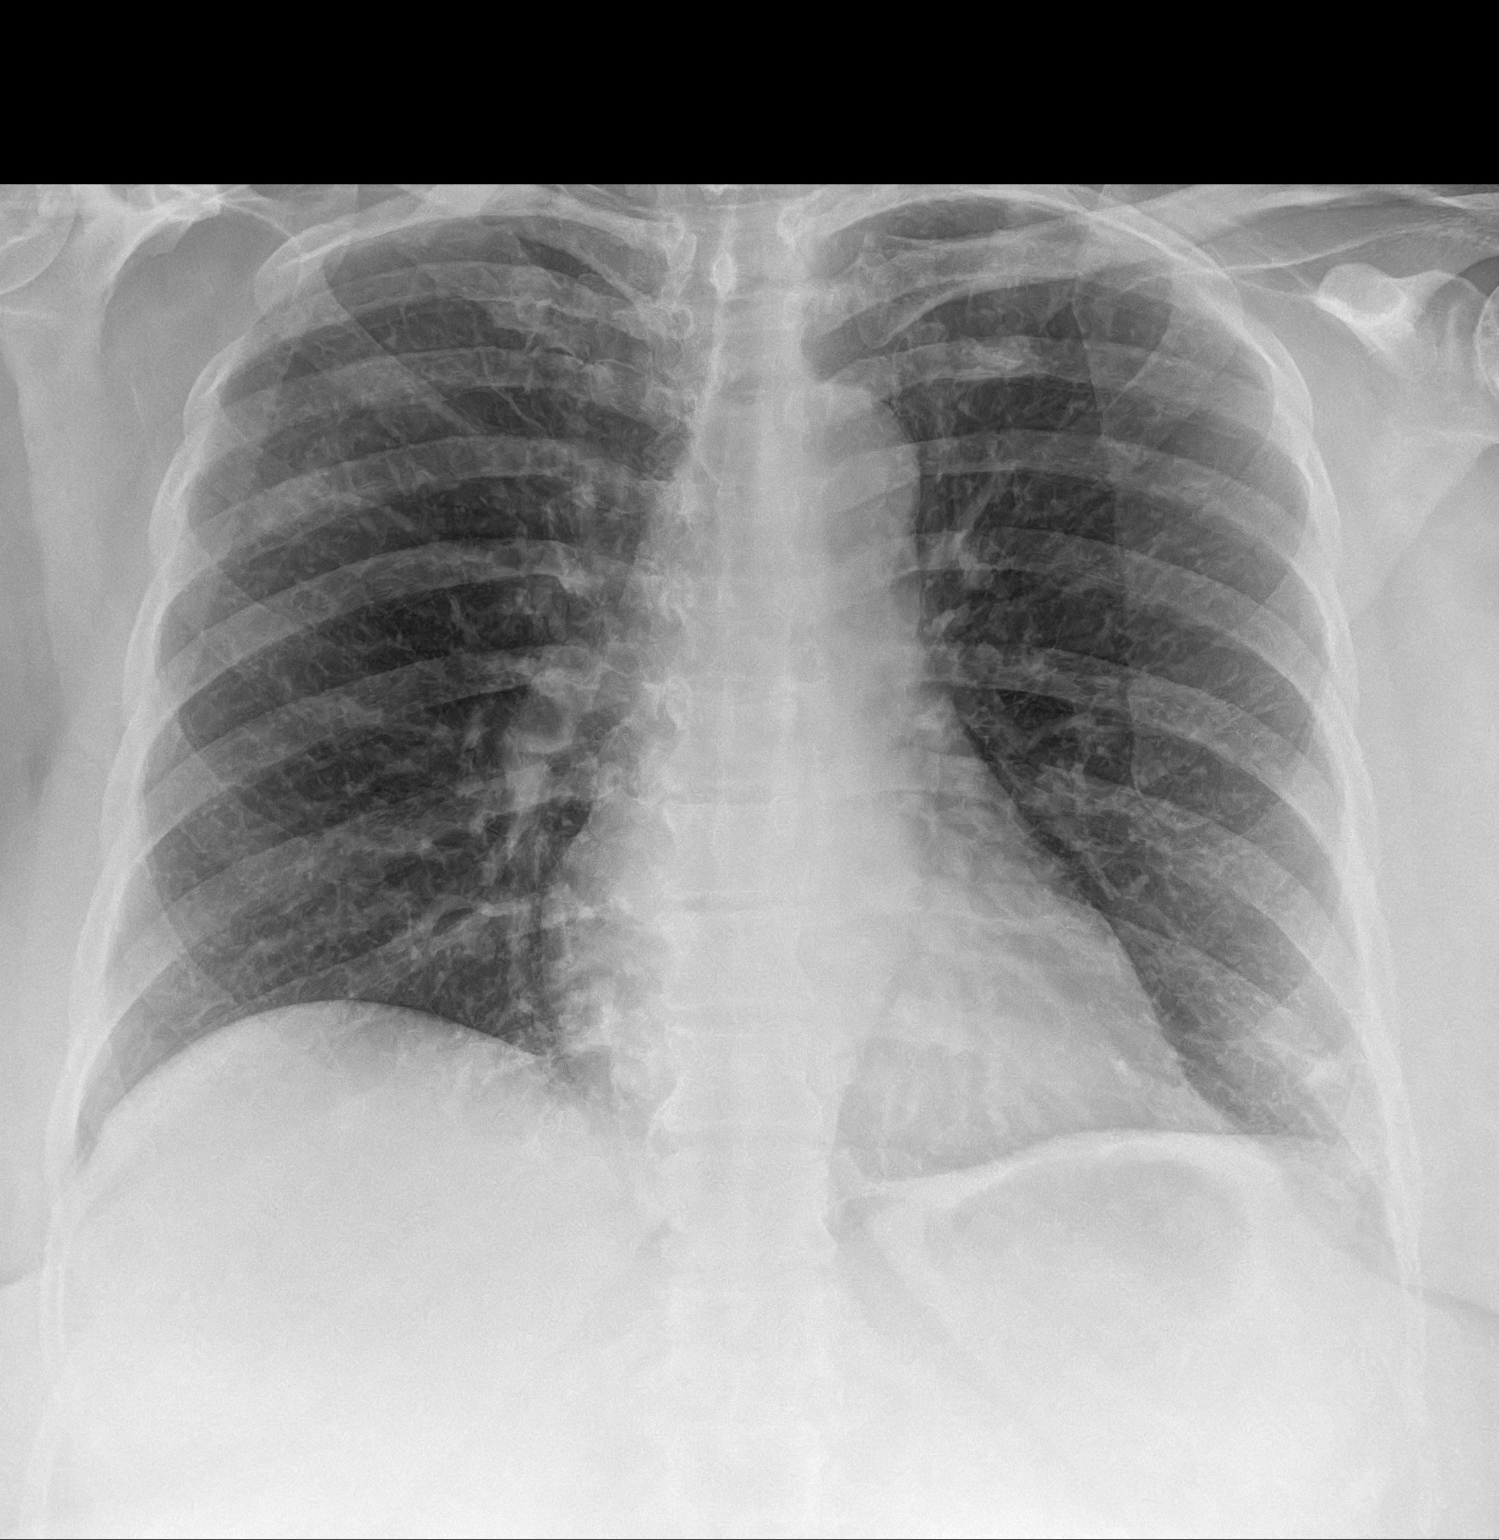

[1 of 1 positions shown; findings below may reference images not displayed]

FINDINGS: The heart size and mediastinal contours are within normal limits.
Mild atelectasis is seen within the lateral aspect of the left lung
base. There is no evidence of a pleural effusion or pneumothorax.
The visualized skeletal structures are unremarkable.
IMPRESSION: Mild left basilar atelectasis.
